# Patient Record
Sex: Female | Born: 1975 | Race: Black or African American | Hispanic: No | Marital: Single | Smoking: Never smoker
Health system: Southern US, Community
[De-identification: ages and names within clinical notes are randomized; demographics above are authoritative.]

## PROBLEM LIST (undated history)

## (undated) DIAGNOSIS — E7800 Pure hypercholesterolemia, unspecified: Secondary | ICD-10-CM

## (undated) DIAGNOSIS — J45909 Unspecified asthma, uncomplicated: Secondary | ICD-10-CM

## (undated) DIAGNOSIS — Z800 Family history of malignant neoplasm of digestive organs: Secondary | ICD-10-CM

## (undated) DIAGNOSIS — C801 Malignant (primary) neoplasm, unspecified: Secondary | ICD-10-CM

## (undated) DIAGNOSIS — Z803 Family history of malignant neoplasm of breast: Secondary | ICD-10-CM

## (undated) DIAGNOSIS — R112 Nausea with vomiting, unspecified: Secondary | ICD-10-CM

## (undated) DIAGNOSIS — Z9889 Other specified postprocedural states: Secondary | ICD-10-CM

## (undated) DIAGNOSIS — Z8042 Family history of malignant neoplasm of prostate: Secondary | ICD-10-CM

## (undated) HISTORY — PX: FINGER SURGERY: SHX640

## (undated) HISTORY — DX: Pure hypercholesterolemia, unspecified: E78.00

## (undated) HISTORY — DX: Family history of malignant neoplasm of prostate: Z80.42

## (undated) HISTORY — DX: Family history of malignant neoplasm of breast: Z80.3

## (undated) HISTORY — PX: WISDOM TOOTH EXTRACTION: SHX21

## (undated) HISTORY — DX: Family history of malignant neoplasm of digestive organs: Z80.0

---

## 2001-01-11 ENCOUNTER — Other Ambulatory Visit: Admission: RE | Admit: 2001-01-11 | Discharge: 2001-01-11 | Payer: Self-pay | Admitting: Obstetrics and Gynecology

## 2002-12-24 ENCOUNTER — Other Ambulatory Visit: Admission: RE | Admit: 2002-12-24 | Discharge: 2002-12-24 | Payer: Self-pay | Admitting: Obstetrics and Gynecology

## 2003-05-12 ENCOUNTER — Encounter: Admission: RE | Admit: 2003-05-12 | Discharge: 2003-05-12 | Payer: Self-pay | Admitting: Family Medicine

## 2003-05-12 IMAGING — CR DG WRIST COMPLETE 3+V*R*
2 series · 2 of 2 positions shown · non-contrast
Comparison: none

CLINICAL DATA: Right forearm and wrist pain.  Question tendinitis.  No known injury.
 RIGHT WRIST 4 VIEWS
 Small lucency within the proximal capitate may represent a small subchondral cyst.  Otherwise, no bony abnormality or soft tissue abnormality detected.  If symptoms persist or progress, MR imaging may be considered for further delineation.
 IMPRESSION
 Small lucency within the capitate may represent a small subchondral cyst.  Otherwise negative exam as noted above.
 RIGHT FOREARM 2 VIEWS
 No evidence of a bony destructive lesion or soft tissue abnormality.  If symptoms persist or progress, MR imaging is recommended for further delineation.
 Negative plain film exam.

[view not recorded (1 of 2)]
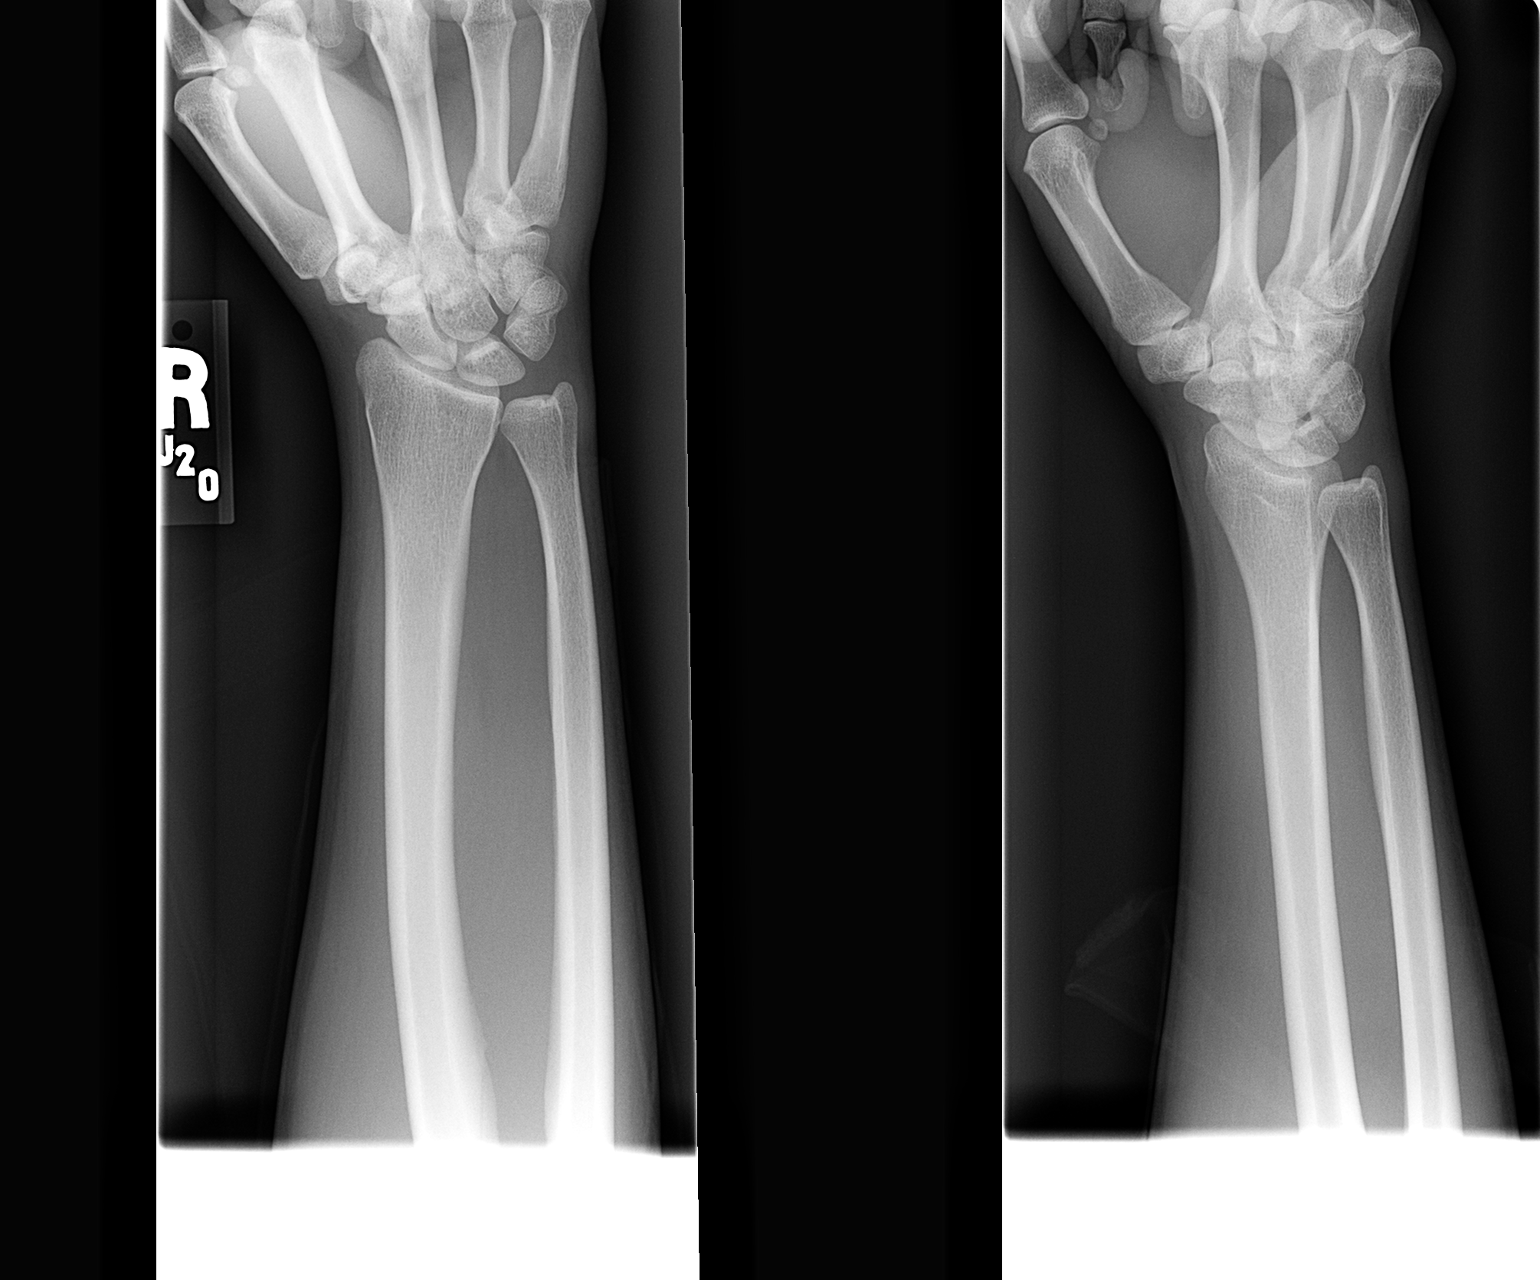

[view not recorded (2 of 2)]
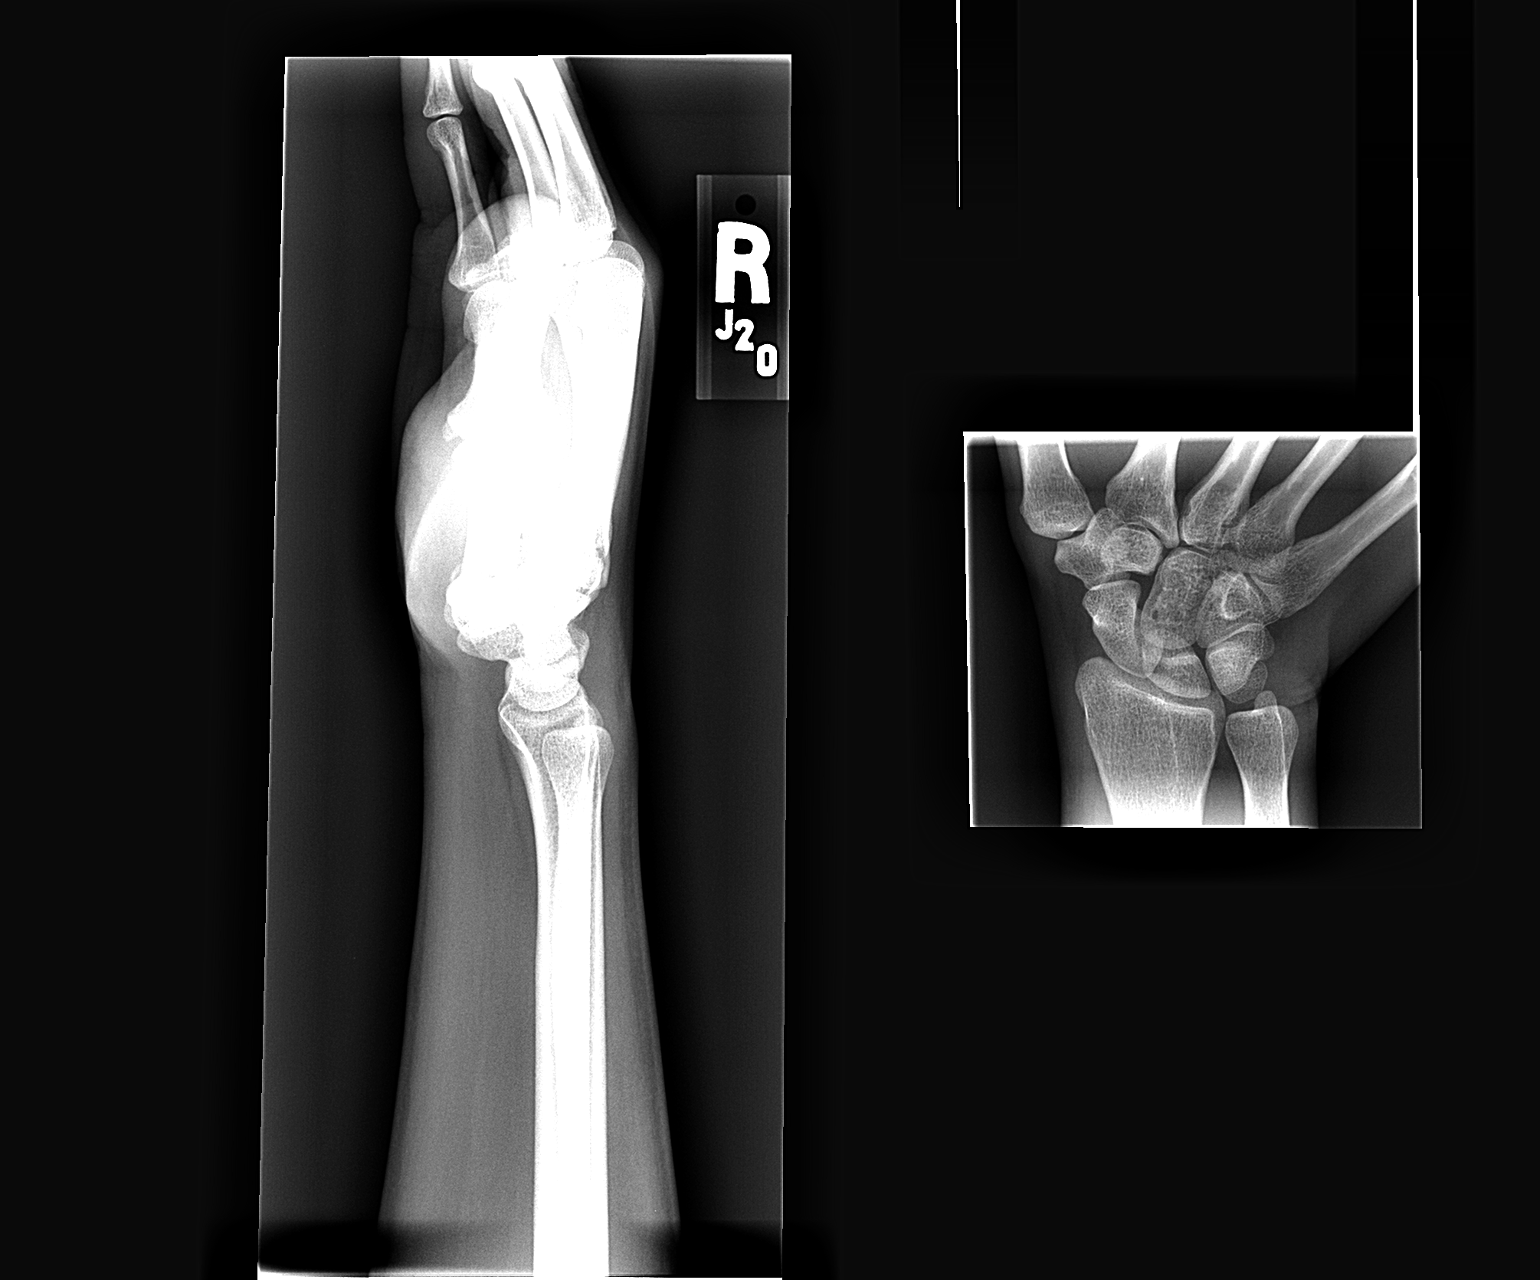

[2 of 2 positions shown; findings below may reference images not displayed]

## 2003-05-12 IMAGING — CR DG FOREARM 2V*R*
2 series · 2 of 2 positions shown · non-contrast
Comparison: none

CLINICAL DATA: Right forearm and wrist pain.  Question tendinitis.  No known injury.
 RIGHT WRIST 4 VIEWS
 Small lucency within the proximal capitate may represent a small subchondral cyst.  Otherwise, no bony abnormality or soft tissue abnormality detected.  If symptoms persist or progress, MR imaging may be considered for further delineation.
 IMPRESSION
 Small lucency within the capitate may represent a small subchondral cyst.  Otherwise negative exam as noted above.
 RIGHT FOREARM 2 VIEWS
 No evidence of a bony destructive lesion or soft tissue abnormality.  If symptoms persist or progress, MR imaging is recommended for further delineation.
 Negative plain film exam.

[view not recorded (1 of 2)]
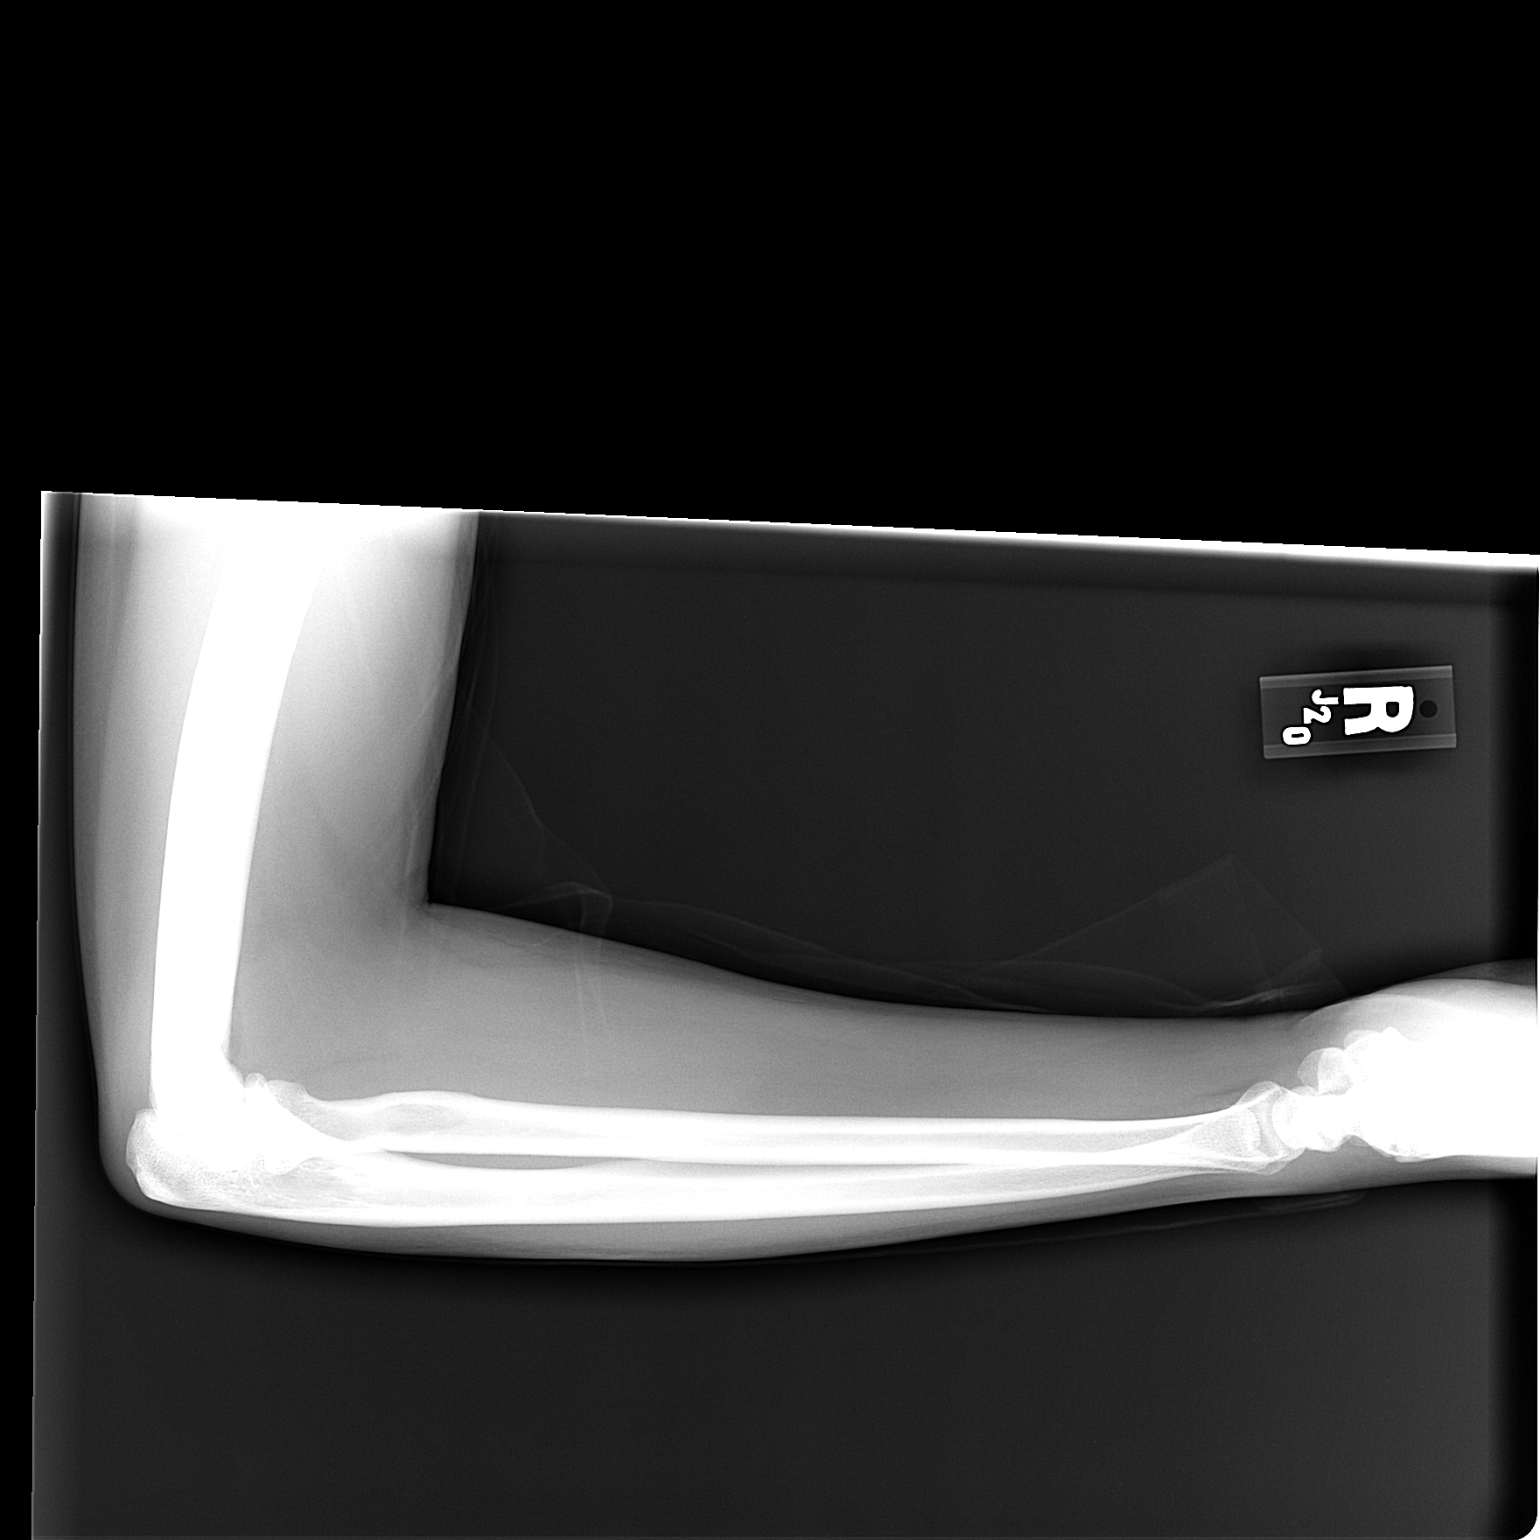

[view not recorded (2 of 2)]
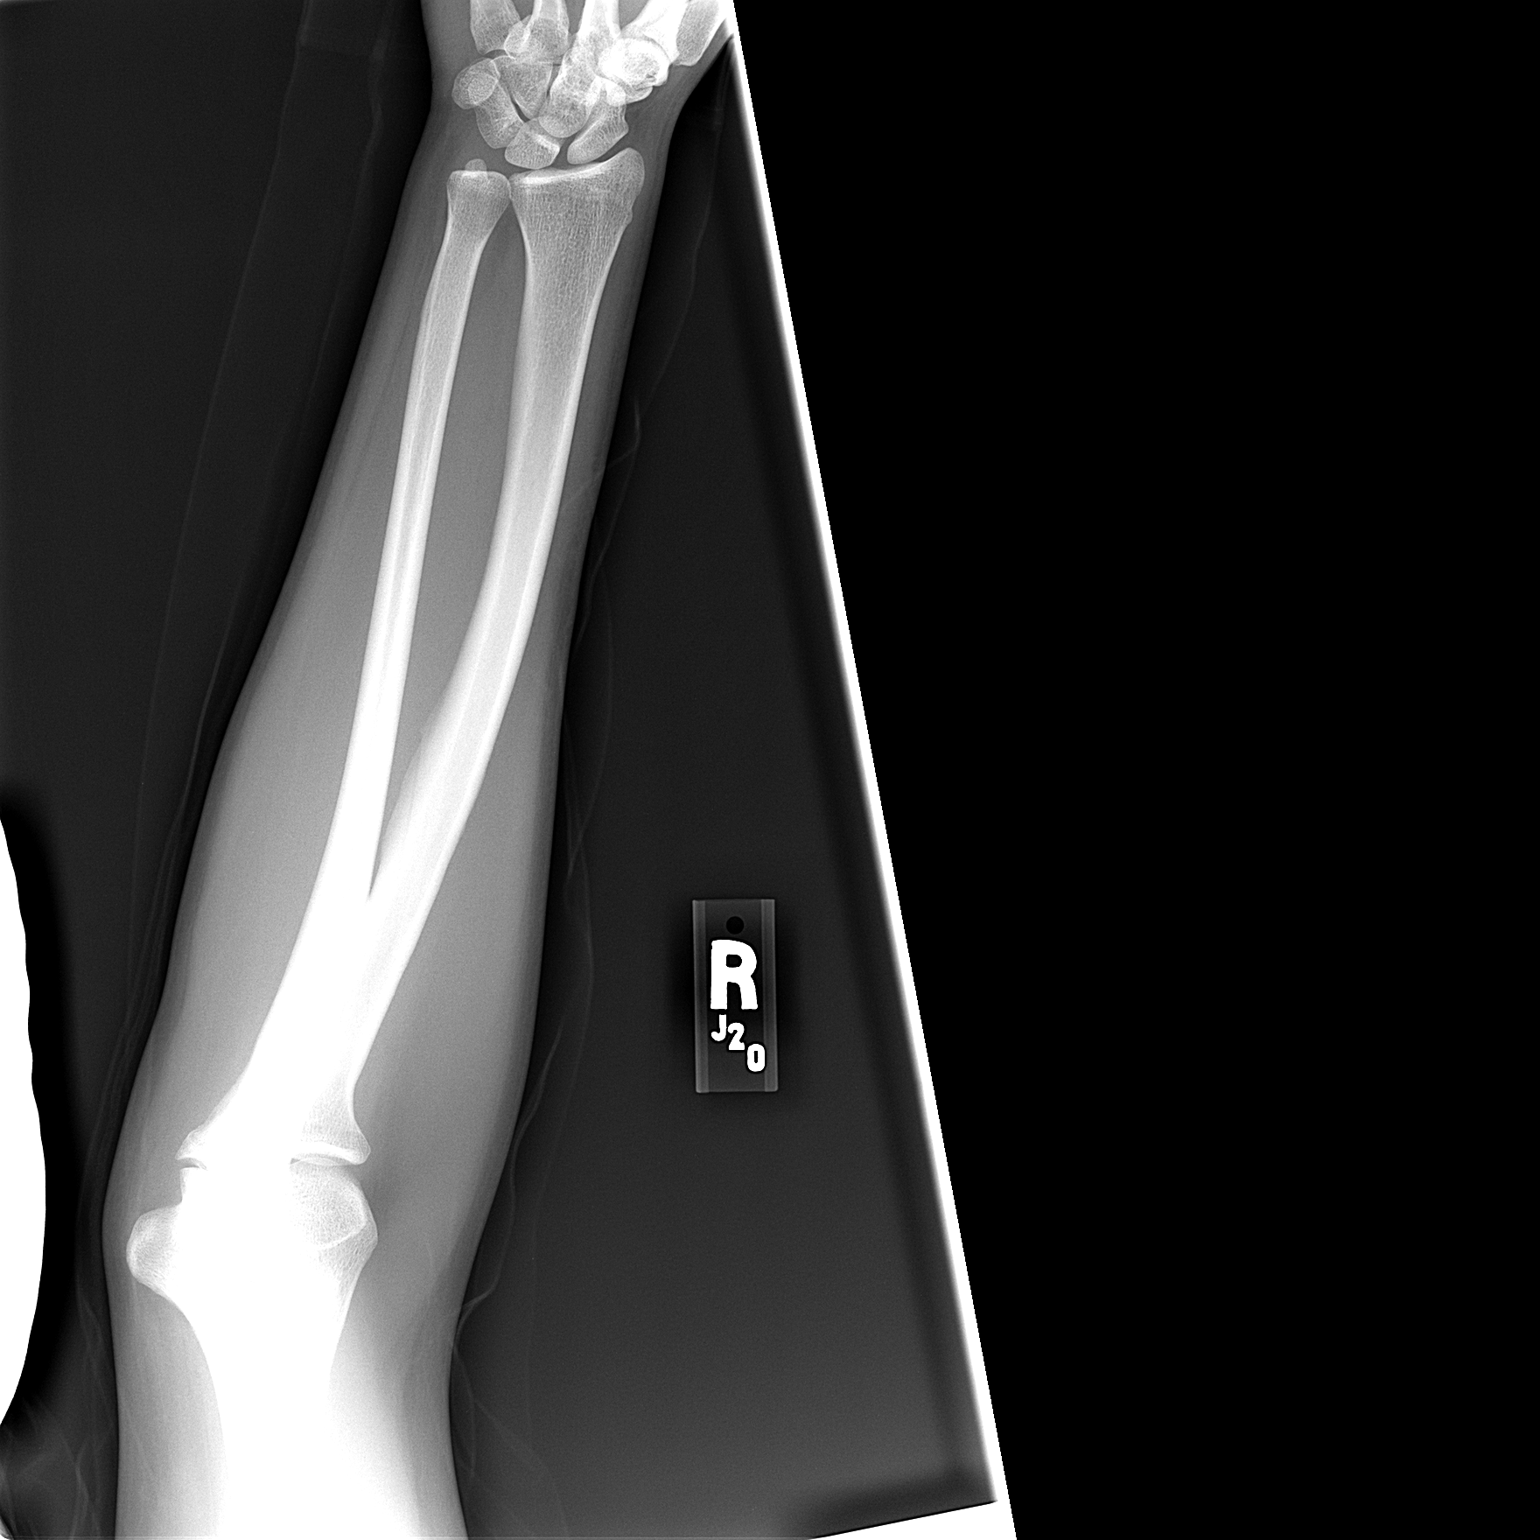

[2 of 2 positions shown; findings below may reference images not displayed]

## 2004-01-19 ENCOUNTER — Other Ambulatory Visit: Admission: RE | Admit: 2004-01-19 | Discharge: 2004-01-19 | Payer: Self-pay | Admitting: Family Medicine

## 2005-03-02 ENCOUNTER — Encounter: Admission: RE | Admit: 2005-03-02 | Discharge: 2005-03-02 | Payer: Self-pay | Admitting: Family Medicine

## 2005-03-02 IMAGING — US US TRANSVAGINAL NON-OB
1 series · 14 of 25 positions shown · non-contrast
Comparison: none

CLINICAL DATA: Pelvic pain and cramping

[Series 1: unknown · 0.18mm/px · 14 of 89 slices shown]
[im 1/89]
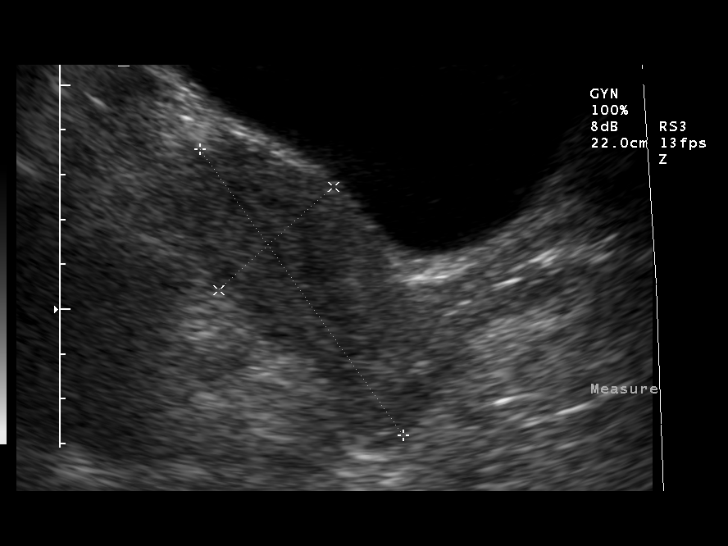
[im 8/89]
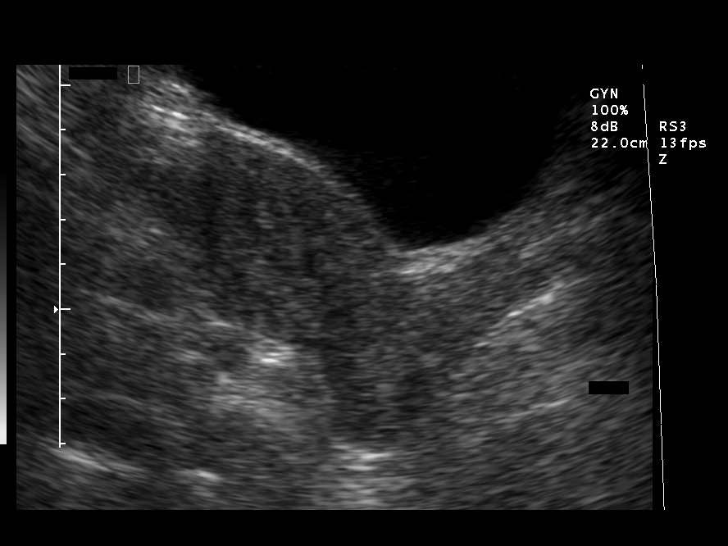
[im 15/89]
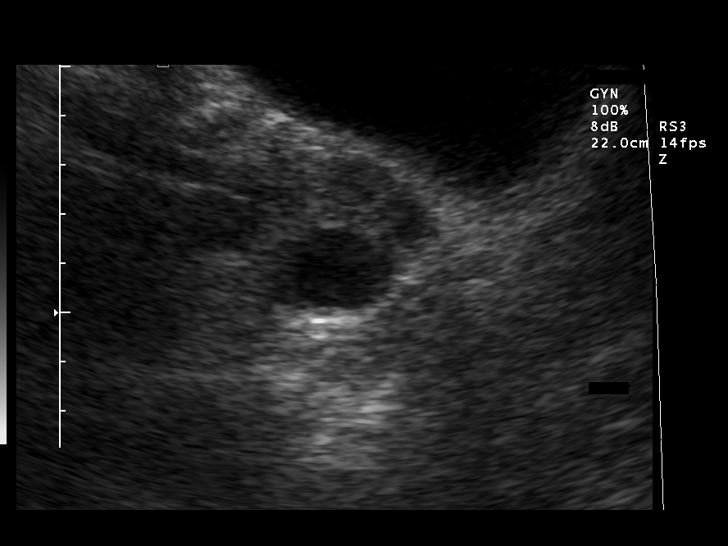
[im 23/89]
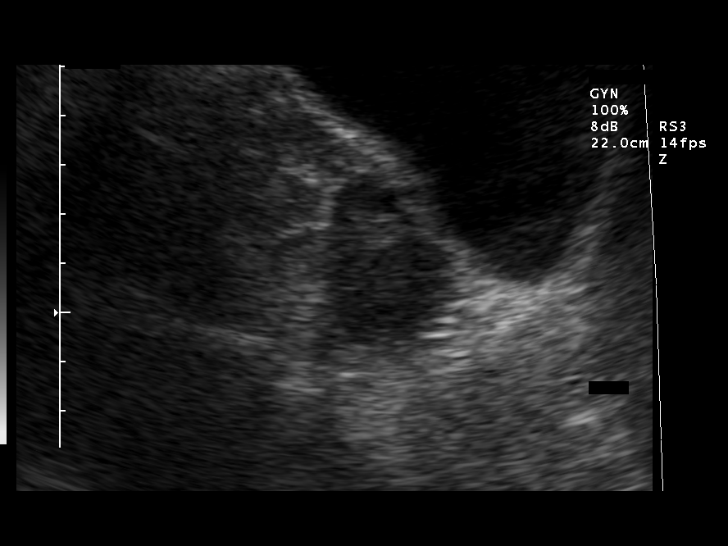
[im 30/89]
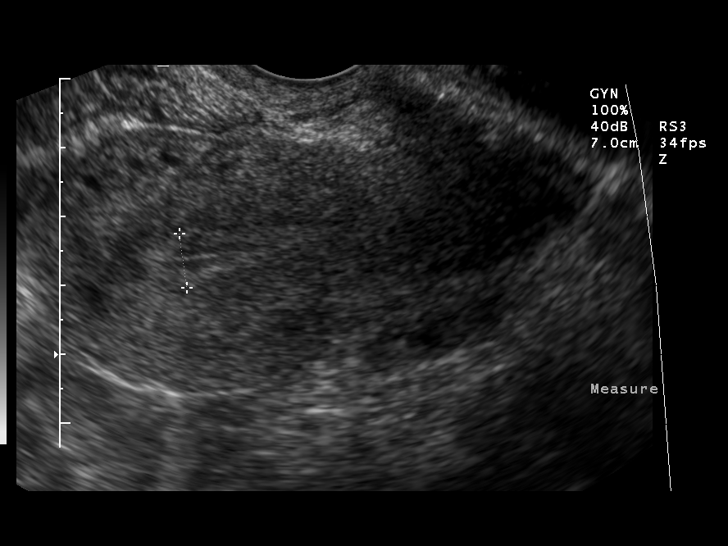
[im 34/89]
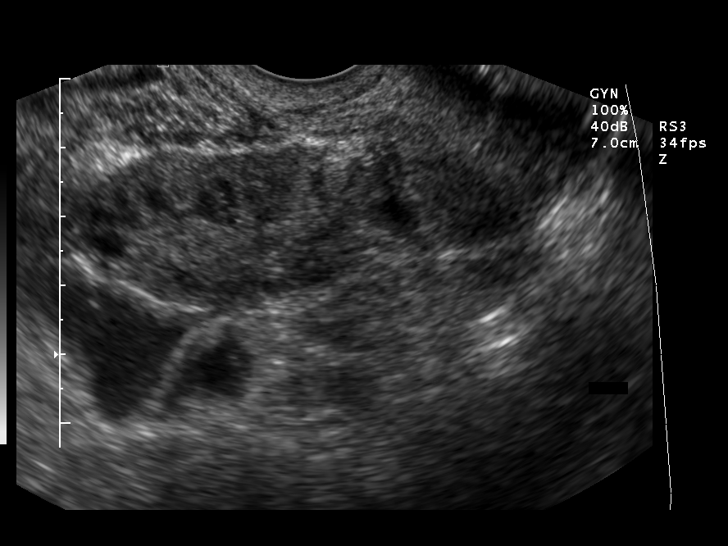
[im 41/89]
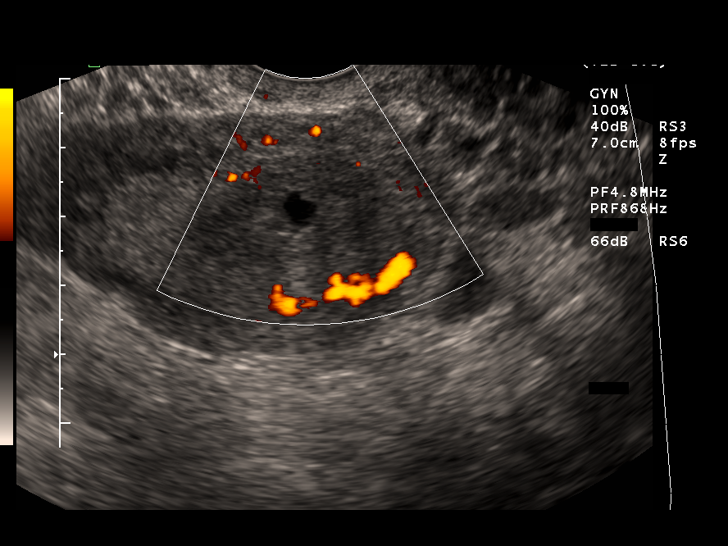
[im 48/89]
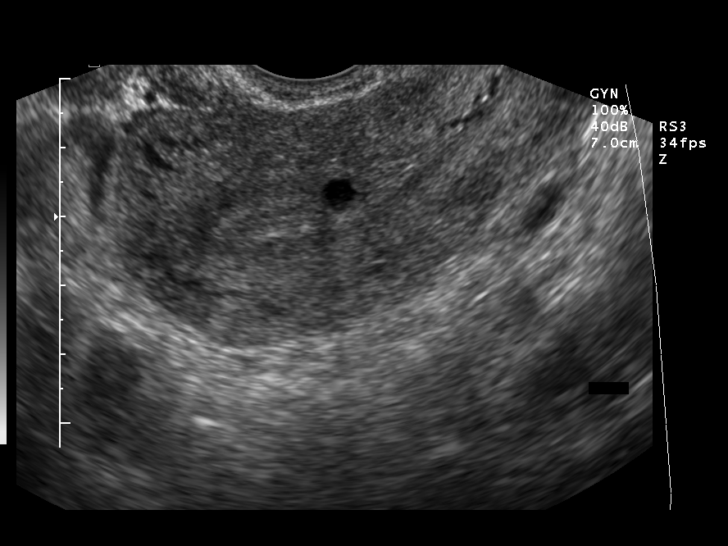
[im 56/89]
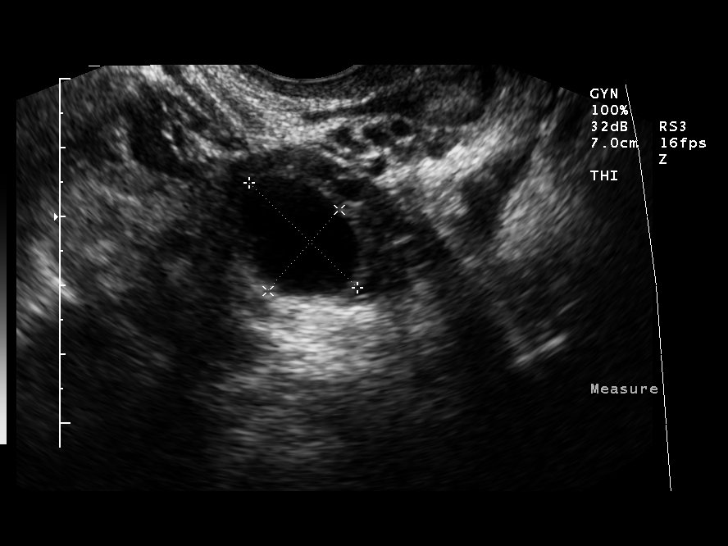
[im 59/89]
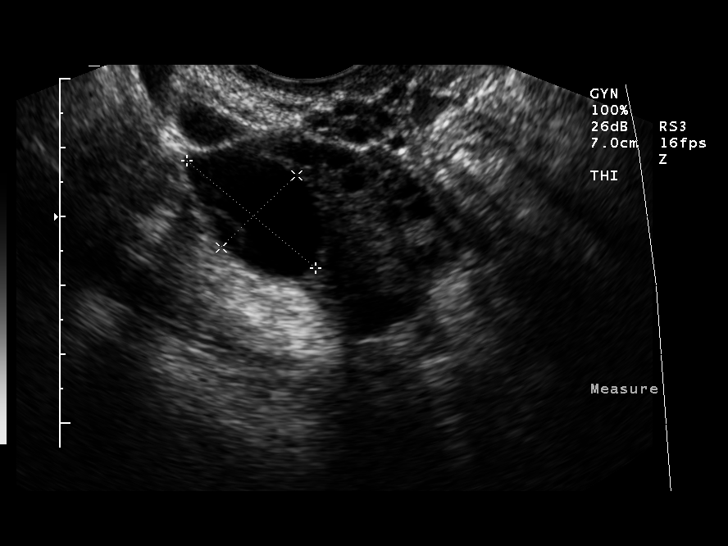
[im 67/89]
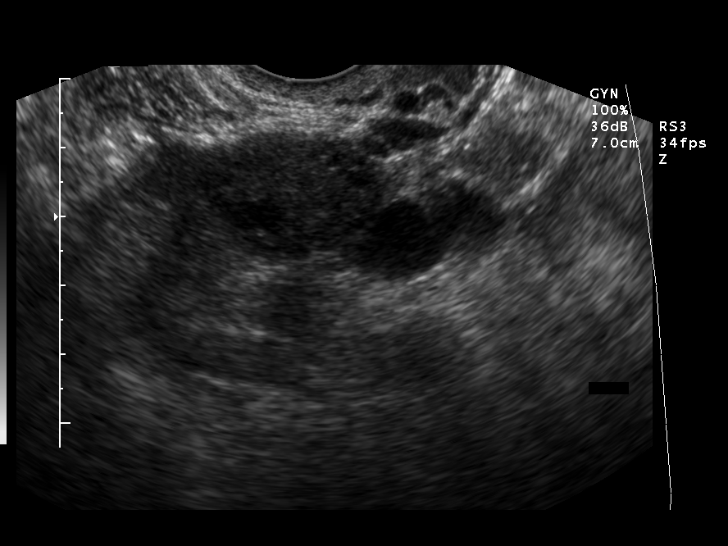
[im 74/89]
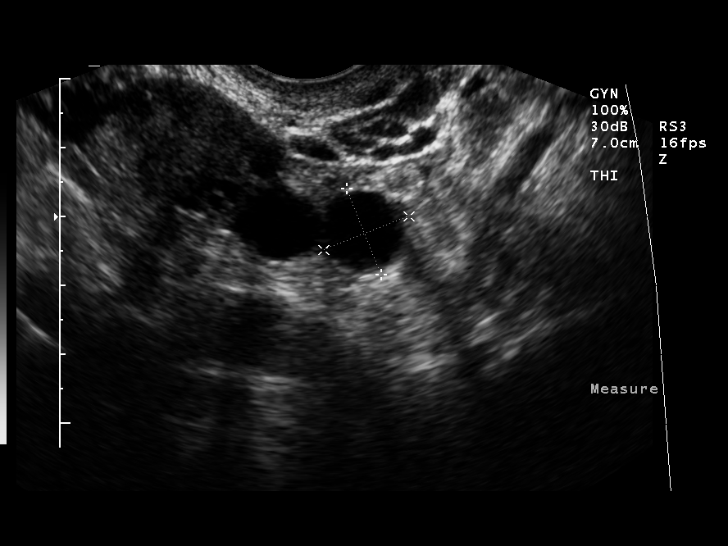
[im 81/89]
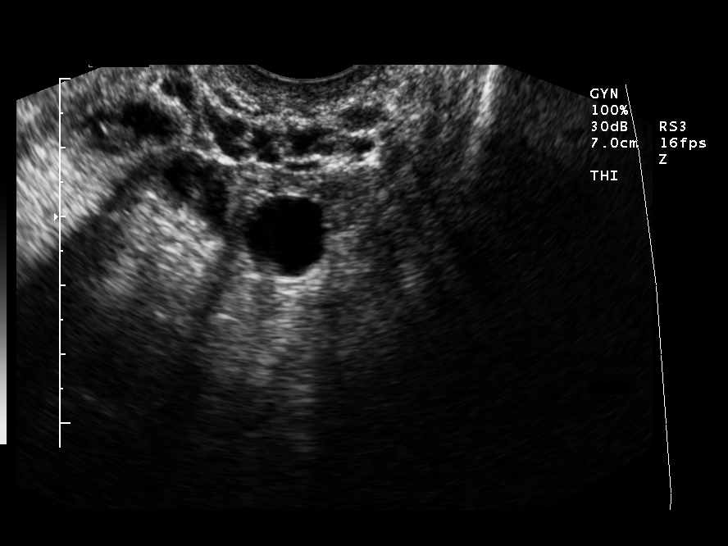
[im 89/89]
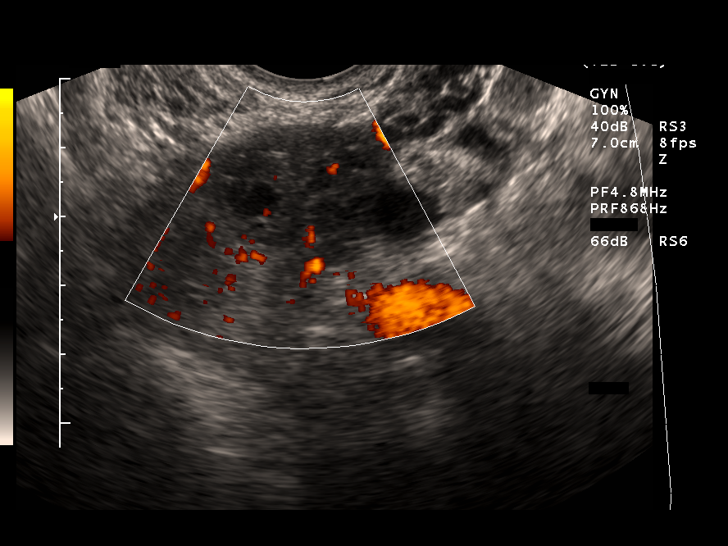

[14 of 25 positions shown; findings below may reference images not displayed]

Pelvic and transvaginal ultrasound:

No previous available for comparison. The uterus measures 3.3 x 5.4 x 8.2 cm.
Endometrial stripe measures up to 7.9 mm in thickness, with suggestion of a
small 7 mm cyst. Right ovary 23 x 26 x 43 mm, containing a 2.4 cm septated cyst
and smaller follicles. Left ovary 20 x 22 x 34 mm, containing several follicles.
Two paraovarian cysts in the left, each 13 mm in diameter. There is no free
fluid.
IMPRESSION: 1. 2.4 cm right ovarian cyst and 2 smaller left parovarian cysts. If symptoms
persist, followup ultrasound after 2 menstrual cycles maybe useful to further
characterize.
2. Unremarkable uterus except for 7 mm cyst in the endometrial cavity. Correlate
with beta-hCG.

## 2005-03-16 ENCOUNTER — Other Ambulatory Visit: Admission: RE | Admit: 2005-03-16 | Discharge: 2005-03-16 | Payer: Self-pay | Admitting: Family Medicine

## 2005-08-05 ENCOUNTER — Encounter: Admission: RE | Admit: 2005-08-05 | Discharge: 2005-08-05 | Payer: Self-pay | Admitting: Family Medicine

## 2005-08-05 IMAGING — CR DG LUMBAR SPINE COMPLETE 4+V
5 series · 5 of 5 positions shown · non-contrast
Comparison: none

CLINICAL DATA: Low back pain for 4 months. 
 LUMBAR SPINE ? 4 VIEW:

[view not recorded (1 of 5)]
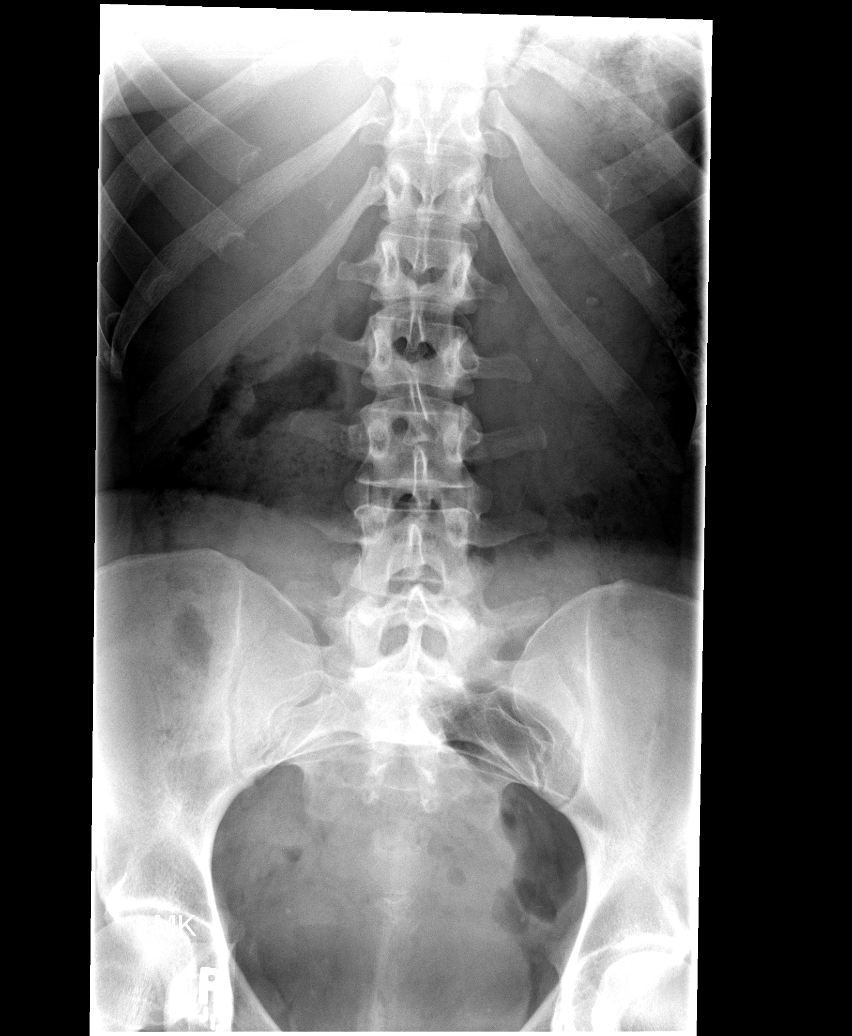

[view not recorded (2 of 5)]
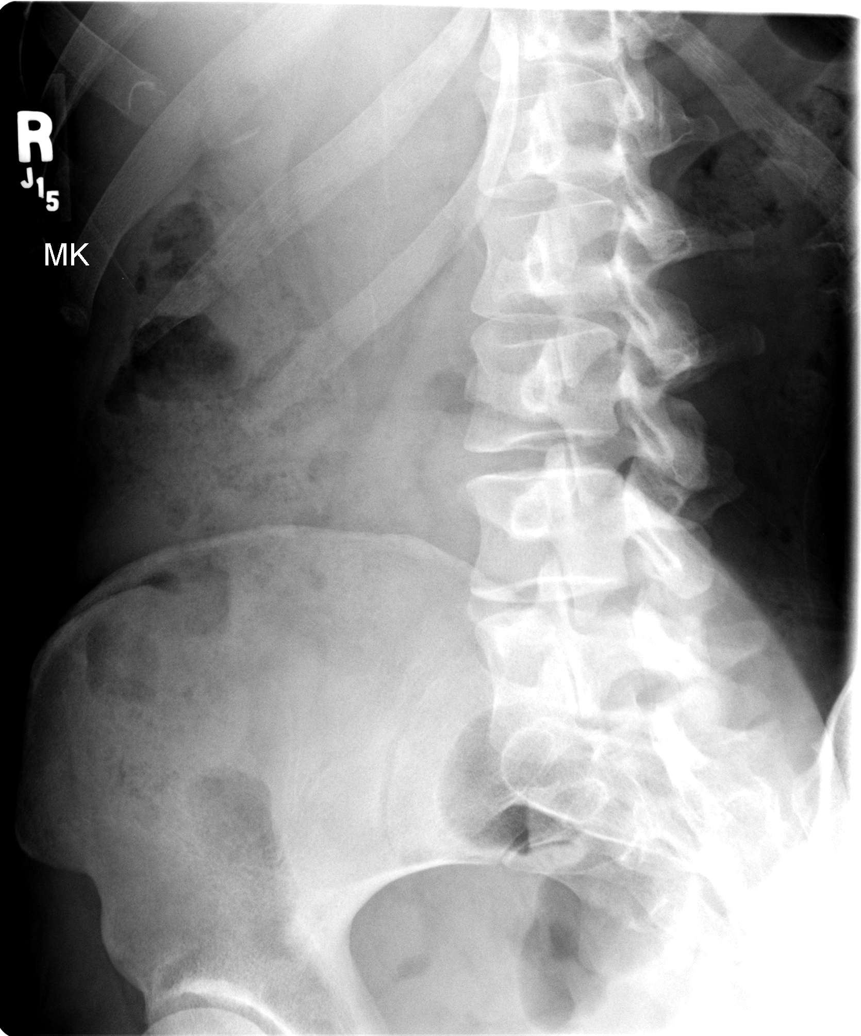

[view not recorded (3 of 5)]
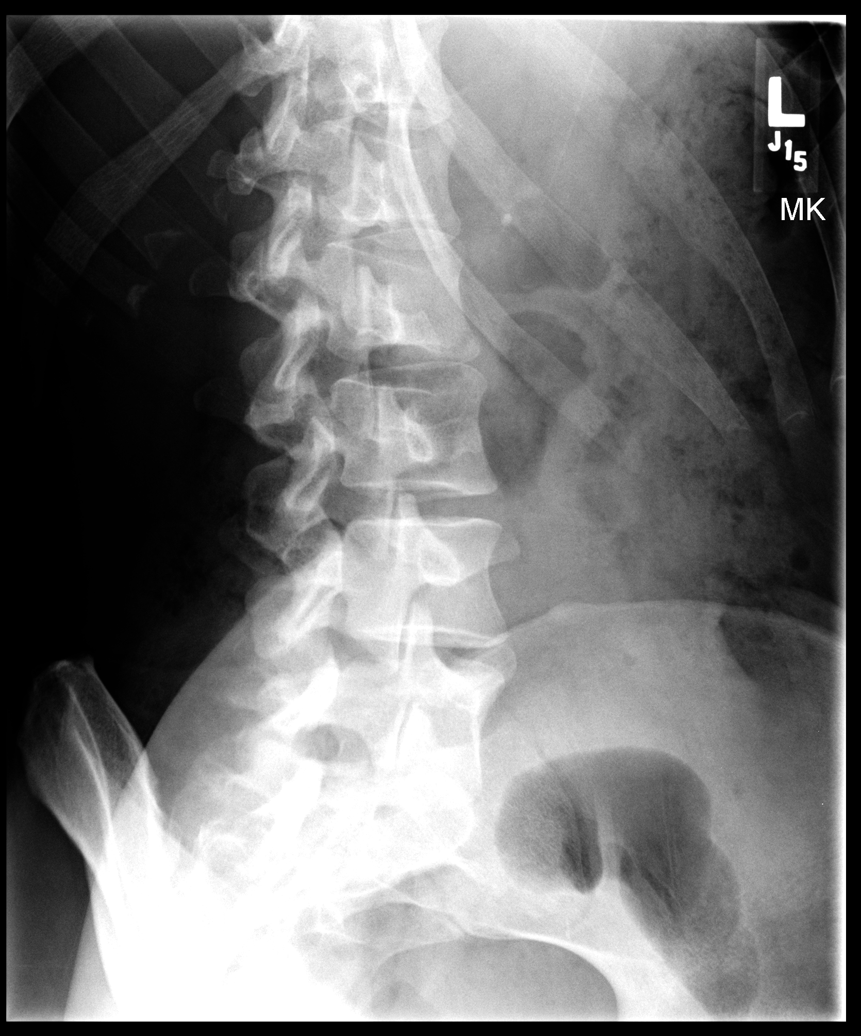

[view not recorded (4 of 5)]
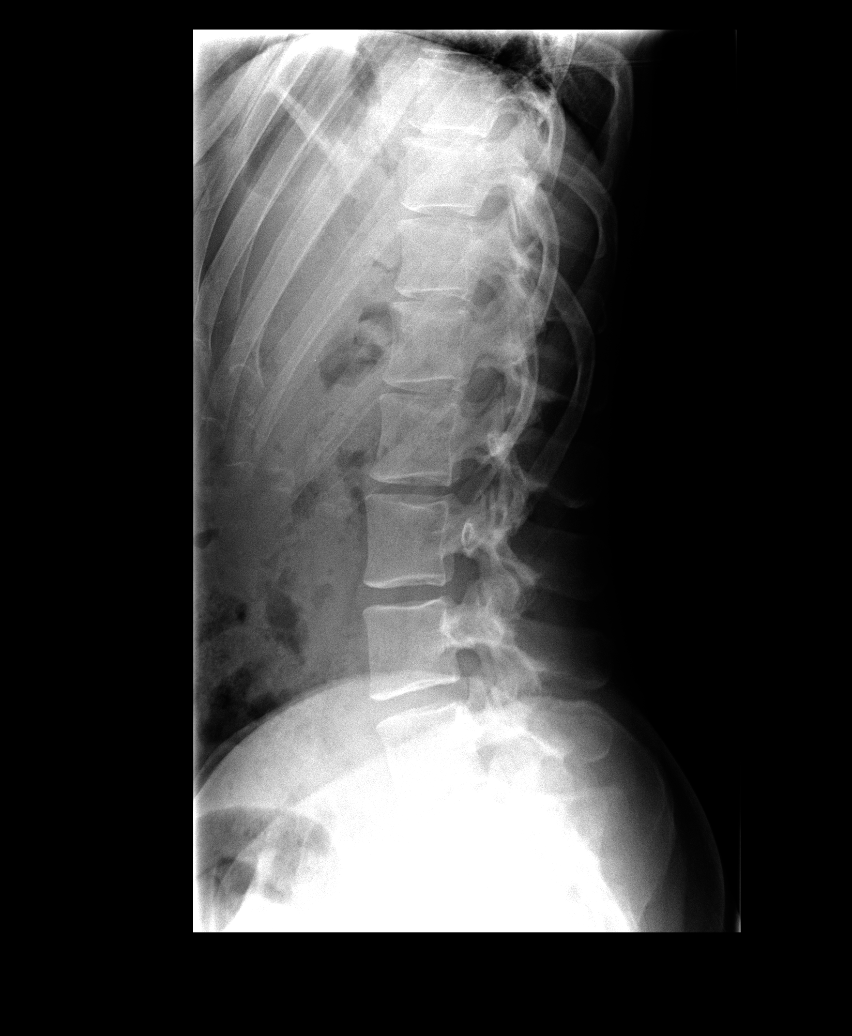

[view not recorded (5 of 5)]
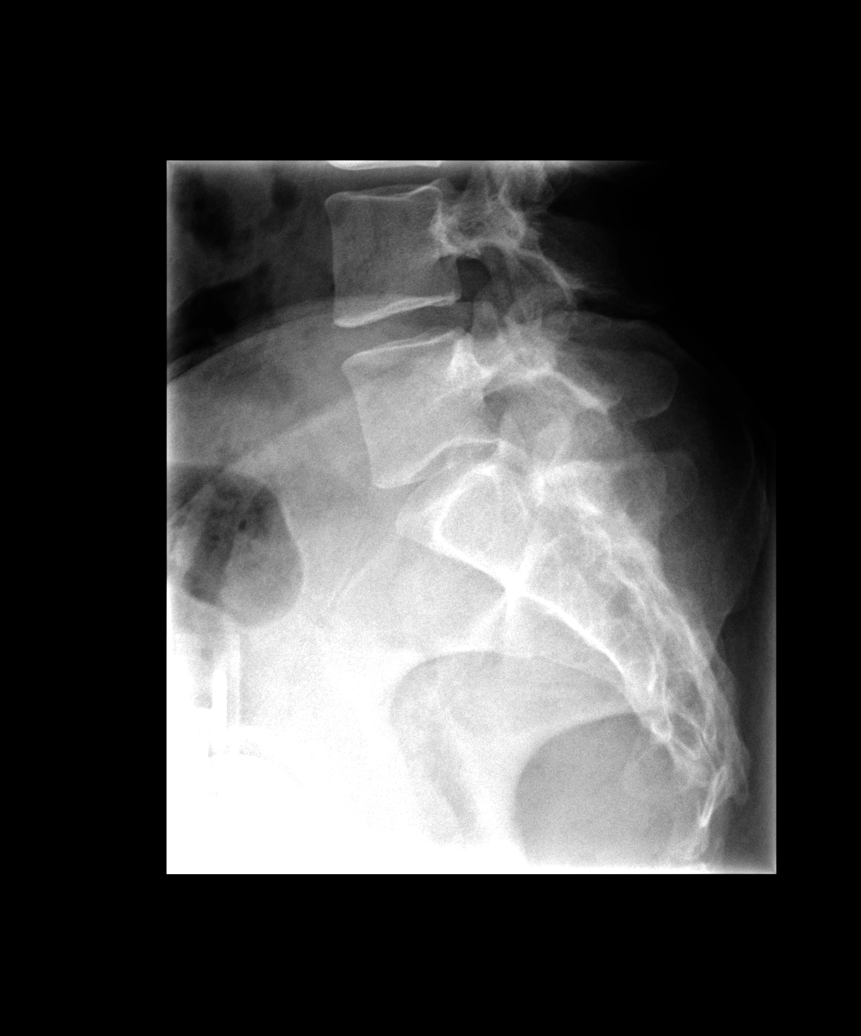

[5 of 5 positions shown; findings below may reference images not displayed]

FINDINGS: There are 5 lumbar-type vertebral bodies in normal alignment.  Disc space heights are normal.  No evidence of osseous or articular pathology.  There is a 4 mm renal stone in the lower pole collecting system on the left.
IMPRESSION: 1.  No spinal pathology evident.
 2.  Left renal calculus.

## 2005-11-04 ENCOUNTER — Ambulatory Visit: Payer: Self-pay | Admitting: Family Medicine

## 2005-12-06 ENCOUNTER — Ambulatory Visit: Payer: Self-pay | Admitting: Family Medicine

## 2005-12-23 ENCOUNTER — Ambulatory Visit: Payer: Self-pay | Admitting: Family Medicine

## 2006-03-07 ENCOUNTER — Ambulatory Visit: Payer: Self-pay | Admitting: Family Medicine

## 2006-03-31 ENCOUNTER — Ambulatory Visit: Payer: Self-pay | Admitting: Family Medicine

## 2006-03-31 LAB — CONVERTED CEMR LAB
Chol/HDL Ratio, serum: 4.1
Cholesterol: 241 mg/dL (ref 0–200)
Glucose, Bld: 87 mg/dL (ref 70–99)
HCT: 41 % (ref 36.0–46.0)
HDL: 58.5 mg/dL (ref >39.0)
Hemoglobin: 13.7 g/dL (ref 12.0–15.0)
LDL DIRECT: 166.3 mg/dL
MCHC: 33.4 g/dL (ref 30.0–36.0)
MCV: 89.7 fL (ref 78.0–100.0)
Platelets: 343 10*3/uL (ref 150–400)
RBC: 4.58 M/uL (ref 3.87–5.11)
RDW: 11.8 % (ref 11.5–14.6)
TSH: 0.77 microintl units/mL (ref 0.35–5.50)
Triglyceride fasting, serum: 59 mg/dL (ref 0–149)
VLDL: 12 mg/dL (ref 0–40)
WBC: 4.9 10*3/uL (ref 4.5–10.5)

## 2006-04-21 ENCOUNTER — Encounter (INDEPENDENT_AMBULATORY_CARE_PROVIDER_SITE_OTHER): Payer: Self-pay | Admitting: Family Medicine

## 2006-04-21 ENCOUNTER — Other Ambulatory Visit: Admission: RE | Admit: 2006-04-21 | Discharge: 2006-04-21 | Payer: Self-pay | Admitting: Family Medicine

## 2006-04-21 ENCOUNTER — Ambulatory Visit: Payer: Self-pay | Admitting: Family Medicine

## 2006-05-26 ENCOUNTER — Ambulatory Visit: Payer: Self-pay | Admitting: Internal Medicine

## 2006-05-26 LAB — CONVERTED CEMR LAB: hCG, Beta Chain, Quant, S: 46288.5 milliintl units/mL

## 2006-11-16 ENCOUNTER — Ambulatory Visit: Payer: Self-pay | Admitting: Internal Medicine

## 2007-01-12 ENCOUNTER — Inpatient Hospital Stay (HOSPITAL_COMMUNITY): Admission: AD | Admit: 2007-01-12 | Discharge: 2007-01-15 | Payer: Self-pay | Admitting: Obstetrics and Gynecology

## 2010-08-10 NOTE — Discharge Summary (Signed)
NAMEMANVI, Paige Perry                ACCOUNT NO.:  [id number]   MEDICAL RECORD NO.:  [id number]          PATIENT TYPE:  INP   LOCATION:  9116                          FACILITY:  [facility]   PHYSICIAN:  Lenoard Aden, M.D.DATE OF BIRTH:  Aug 26, 1975   DATE OF ADMISSION:  01/12/2007  DATE OF DISCHARGE:                               DISCHARGE SUMMARY   PRESENTING COMPLAINT:  Labor.   She is a 35 year old African American female G2, P1 at 39+ weeks who  presents in active labor.  She has no known drug allergies.  Medications  are prenatal vitamins.  She is a history of spontaneous vaginal delivery  x1 and TAB x2.  She is a nonsmoker, nondrinker.  She denies domestic or  physical violence.  She has a family history of chronic hypertension,  breast cancer, heart disease, sickle cell disease, and migraine  headaches.  She has a personal history of an abnormal Pap smear status  post laser surgery in 1995, history of chlamydia, history of asthma,  history of migraine headache.   PHYSICAL EXAMINATION:  She is well-developed, well-nourished African  American female in no acute distress.  HEENT:  Normal.  LUNGS:  Clear.  HEART:  Regular rate and rhythm.  ABDOMEN:  Soft, gravid, nontender.  Estimated fetal weight 7-and-a-half  pounds.  CERVIX:  Is 4, 100%, 0 station.  EXTREMITIES:  Show no cords.  NEUROLOGIC:  Nonfocal.  SKIN:  Intact.   IMPRESSION:  Term intrauterine pregnancy in early labor.   PLAN:  Anticipate attempts at vaginal delivery.      Lenoard Aden, M.D.  Electronically Signed     RJT/MEDQ  D:  01/13/2007  T:  01/15/2007  Job:  [id number]

## 2010-08-10 NOTE — Assessment & Plan Note (Signed)
[facility]                             PULMONARY OFFICE NOTE   NAME:FLOODDillyn, Joaquin                       MRN:          [id number]  DATE:11/16/2006                            DOB:          [birth year]    REFERRING PHYSICIAN:  Maxie Better, M.D.   REASON FOR CONSULTATION:  Dyspnea.   HISTORY:  A 35 year old black female who was pregnant 13 years ago at  about 30 pounds lighter and had no trouble at all.  However, on this  pregnancy for the last month she has noticed progressive dyspnea  associated with exertion associated with progressive abdominal  distention and mild reflux symptoms consisting of heartburn especially  after she eats.  She says both of these symptoms are different than what  she experienced on her last pregnancy 13 years ago.   The patient does have a history of asthma as a baby but says she outgrew  it in high school.  It came back after she was mowing grass 3 years  ago and was controlled with an inhaler but no longer is she mowing grass  and so she no longer is having any problem with this symptom.  She feels  this feels much different than what her asthma felt like.   She denies any associated cough, fevers, chills, sweats, chest pain,  significant leg swelling, orthopnea, PND, or any nocturnal exacerbation  of this.   PAST MEDICAL HISTORY:  Significant only for the fact that she is 8  months pregnant and had normal pregnancy 13 years ago as noted.   ALLERGIES:  None known.   MEDICATIONS:  Prenatal vitamins daily.   SOCIAL HISTORY:  She never smoked.  She works in [employer] and denies  any unusual travel, pet, or hobby exposure.   FAMILY HISTORY:  Significant for asthma in her father who also developed  emphysema and was a smoker.   REVIEW OF SYSTEMS:  Taken in detail on the worksheet negative except as  outlined above.   PHYSICAL EXAMINATION:  GENERAL:  This is a pleasant ambulatory black  female in no acute  distress.  VITAL SIGNS:  The patient is afebrile with normal vital signs with a  blood pressure of 100/66, pulse rate is 100 at rest.  HEENT:  Unremarkable.  Pharynx is clear.  Dentition is intact.  Nasal  turbinates normal.  Ear canals clear bilaterally.  NECK:  Supple without cervical adenopathy or tenderness.  Trachea is  midline.  No thyromegaly.  LUNGS:  Fields perfectly clear bilaterally on auscultation percussion.  HEART:  There is a regular rate and rhythm without murmur, gallop, or  rub.  No increase in P2.  ABDOMEN:  Soft, benign consistent with an 2-month intrauterine  pregnancy.  EXTREMITIES:  Warm without calf tenderness, cyanosis, clubbing, edema.   Hemoglobin saturation was 98% at rest and we walked her around the  office for a total of 3 laps at 185 feet each with a peak heart rate of  123 and a saturation of 98%.   IMPRESSION:  1. 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  The fact that she weighs 31      pounds more than she did at this point in her last pregnancy I      think explains exactly the dyspnea that she experiences that has      been progressive and proportionate to activity.  She has no      evidence of asthma, thromboembolic disease, or occult interstitial      lung disease at this point and I simply reassured her that this is      what one would expect with pregnancy.  2. However, she does have significant heartburn that I think may be      contributing to dyspnea and is definitely making her uncomfortable.      I recommended Prevacid plus diet and asked her to take one pill      before breakfast for a 2-week trial to see to what extent it      improved the problem and if so, she could either take Prilosec or      get a prescription for Prevacid through Dr. Cherly Hensen on her next      visit.   I did describe briefly how the diagnosis of asthma or pulmonary embolism  is different from  what she is experiencing and asked her to be on the  lookout for disproportionate dyspnea or any abrupt change in her pattern  of dyspnea and we would be happy to see her back here in the pulmonary  office.  Otherwise, pulmonary followup can be p.r.n.     Charlaine Dalton. Sherene Sires, MD, [facility]  Electronically Signed    MBW/MedQ  DD: 11/16/2006  DT: 11/17/2006  Job #: [id number]   cc:   Maxie Better, M.D.

## 2010-08-13 NOTE — Assessment & Plan Note (Signed)
[facility]                          [facility] OFFICE NOTE   NAME:FLOODDustyn, Perry                       MRN:          [id number]  DATE:11/04/2005                            DOB:          Dec 11, 1975    REASON FOR VISIT:  Lower back pain.   [name] is a 35 year old female who reports that several months ago  she was having lower back pain which was treated with muscle relaxant and  anti-inflammatory.  Symptoms improved over the last month.  She has noticed  a discomfort in her lower back again usually associated with sitting in a  chair.  She denies any radiation of the pain.  She states it is localized to  the spine itself.  She did have an x-ray performed at her last visit, which  found no acute process in her lower back although there was an incidental  finding of a renal kidney stone.  She denies any significant change in her  activity, although she has been bowling recently.   PAST MEDICAL HISTORY:  Asthma, allergies.   SURGICAL HISTORY:  Cervical biopsy for positive HPV.   MEDICATIONS:  NuvaRing.   ALLERGIES:  No known drug allergies.   FAMILY HISTORY:  Mother has a history of hypertension, hyperlipidemia, sleep  apnea and glaucoma.  Father has a history of emphysema.  Mom has breast  cancer.   SOCIAL HISTORY:  She denies any alcohol or tobacco use.  She is single with  no children.   REVIEW OF SYSTEMS:  As per HPI.   OBJECTIVE:  VITAL SIGNS:  Blood pressure 106/68, weight of 131, pulse of 88.  GENERAL:  We have a pleasant female in no acute distress, questions  appropriately.  MUSCULOSKELETAL/NEUROLOGIC:  Significant for localized tenderness to the  lumbar spine.  No paraspinal tenderness was noted.  Straight leg raise was  negative.  Deep tendon reflexes were 2+ and equal bilaterally.  Gait was  uninhibited.   IMPRESSION:  A 35 year old female complaining of low back pain, which  previously improved with  anti-inflammatory and muscle relaxant.   PLAN:  1. Will re-treat the patient with Flexeril 10 mg t.i.d., side effects were      reviewed.  2. Also provided a prescription for Naprosyn 500 mg b.i.d. for 7-10 days.      Side effects were reviewed.  3. Additionally, will refer the patient to orthopedics given that her pain      is localized to the      lumbar spine itself.  I did review the x-ray report and advised the      patient to pick up her x-ray prior to her appointment with orthopedics.      The patient expressed understanding.                                   Leanne Chang, MD   LA/MedQ  DD:  11/04/2005  DT:  11/05/2005  Job #:  [id number]

## 2010-12-08 ENCOUNTER — Encounter: Payer: Self-pay | Admitting: Oncology

## 2011-01-05 LAB — CBC
HCT: 31.7 — ABNORMAL LOW
HCT: 34.6 — ABNORMAL LOW
Hemoglobin: 10.8 — ABNORMAL LOW
Hemoglobin: 11.9 — ABNORMAL LOW
MCHC: 34
MCHC: 34.5
MCV: 90.9
MCV: 91.9
Platelets: 238
Platelets: 260
RBC: 3.45 — ABNORMAL LOW
RBC: 3.81 — ABNORMAL LOW
RDW: 13.2
RDW: 13.4
WBC: 12.3 — ABNORMAL HIGH
WBC: 18.1 — ABNORMAL HIGH

## 2011-01-05 LAB — RPR: RPR Ser Ql: NONREACTIVE

## 2012-01-09 ENCOUNTER — Encounter (HOSPITAL_COMMUNITY): Payer: Self-pay

## 2012-01-09 ENCOUNTER — Emergency Department (HOSPITAL_COMMUNITY)
Admission: EM | Admit: 2012-01-09 | Discharge: 2012-01-09 | Disposition: A | Discharge status: Home / Self Care | Payer: 59 | Attending: Emergency Medicine | Admitting: Emergency Medicine

## 2012-01-09 DIAGNOSIS — M549 Dorsalgia, unspecified: Secondary | ICD-10-CM | POA: Insufficient documentation

## 2012-01-09 DIAGNOSIS — R109 Unspecified abdominal pain: Secondary | ICD-10-CM | POA: Insufficient documentation

## 2012-01-09 DIAGNOSIS — A499 Bacterial infection, unspecified: Secondary | ICD-10-CM | POA: Insufficient documentation

## 2012-01-09 DIAGNOSIS — Z7982 Long term (current) use of aspirin: Secondary | ICD-10-CM | POA: Insufficient documentation

## 2012-01-09 DIAGNOSIS — B9689 Other specified bacterial agents as the cause of diseases classified elsewhere: Secondary | ICD-10-CM | POA: Insufficient documentation

## 2012-01-09 DIAGNOSIS — N390 Urinary tract infection, site not specified: Secondary | ICD-10-CM

## 2012-01-09 DIAGNOSIS — N760 Acute vaginitis: Secondary | ICD-10-CM

## 2012-01-09 DIAGNOSIS — R10819 Abdominal tenderness, unspecified site: Secondary | ICD-10-CM | POA: Insufficient documentation

## 2012-01-09 LAB — URINALYSIS, ROUTINE W REFLEX MICROSCOPIC
Bilirubin Urine: NEGATIVE
Glucose, UA: NEGATIVE mg/dL
Ketones, ur: 40 mg/dL — AB
Leukocytes, UA: NEGATIVE
Nitrite: NEGATIVE
Specific Gravity, Urine: 1.018 (ref 1.005–1.030)
pH: 7 (ref 5.0–8.0)

## 2012-01-09 LAB — CBC WITH DIFFERENTIAL/PLATELET
Basophils Absolute: 0 10*3/uL (ref 0.0–0.1)
Basophils Relative: 0 % (ref 0–1)
Eosinophils Absolute: 0 10*3/uL (ref 0.0–0.7)
Eosinophils Relative: 0 % (ref 0–5)
HCT: 43.2 % (ref 36.0–46.0)
Hemoglobin: 14.3 g/dL (ref 12.0–15.0)
Lymphocytes Relative: 10 % — ABNORMAL LOW (ref 12–46)
Lymphs Abs: 1 10*3/uL (ref 0.7–4.0)
MCHC: 33.1 g/dL (ref 30.0–36.0)
Monocytes Absolute: 0.6 10*3/uL (ref 0.1–1.0)
Neutrophils Relative %: 84 % — ABNORMAL HIGH (ref 43–77)
Platelets: 231 10*3/uL (ref 150–400)
RBC: 4.86 MIL/uL (ref 3.87–5.11)
WBC: 9.6 10*3/uL (ref 4.0–10.5)

## 2012-01-09 LAB — BASIC METABOLIC PANEL
BUN: 10 mg/dL (ref 6–23)
Chloride: 100 mEq/L (ref 96–112)
Creatinine, Ser: 0.9 mg/dL (ref 0.50–1.10)
GFR calc Af Amer: >90 mL/min (ref >90)
GFR calc non Af Amer: 81 mL/min — ABNORMAL LOW (ref >90)
Glucose, Bld: 75 mg/dL (ref 70–99)
Potassium: 3.8 mEq/L (ref 3.5–5.1)
Sodium: 138 mEq/L (ref 135–145)

## 2012-01-09 LAB — WET PREP, GENITAL
Trich, Wet Prep: NONE SEEN
Yeast Wet Prep HPF POC: NONE SEEN

## 2012-01-09 LAB — URINE MICROSCOPIC-ADD ON

## 2012-01-09 LAB — POCT PREGNANCY, URINE: Preg Test, Ur: NEGATIVE

## 2012-01-09 MED ORDER — IBUPROFEN 400 MG PO TABS: 400.0000 mg | ORAL_TABLET | Freq: Four times a day (QID) | ORAL | Status: DC | PRN

## 2012-01-09 MED ORDER — HYDROCODONE-ACETAMINOPHEN 5-325 MG PO TABS
1.0000 | ORAL_TABLET | Freq: Once | ORAL | Status: AC
  Administered 2012-01-09: 1 via ORAL
  Filled 2012-01-09: qty 1

## 2012-01-09 MED ORDER — METRONIDAZOLE 500 MG PO TABS: 500.0000 mg | ORAL_TABLET | Freq: Two times a day (BID) | ORAL | Status: DC

## 2012-01-09 MED ORDER — NITROFURANTOIN MONOHYD MACRO 100 MG PO CAPS: 100.0000 mg | ORAL_CAPSULE | Freq: Two times a day (BID) | ORAL | Status: DC

## 2012-01-09 NOTE — ED Notes (Signed)
Patient reports that she is having dysuria, lower abdominal and left flank pain x 3 days. Patient denies any vaginal discharge.

## 2012-01-09 NOTE — ED Provider Notes (Addendum)
History     CSN: [id number]  Arrival date & time 01/09/12  1443   First MD Initiated Contact with Patient 01/09/12 1622      Chief Complaint  Patient presents with  . Back Pain  . Abdominal Pain    (Consider location/radiation/quality/duration/timing/severity/associated sxs/prior treatment) HPI Comments: Pt comes in with cc of abd pain and some back pain. Pt reports that her sx started just before the weekend, and that the pain is located in the suprapubic region on the right side and is constant pain - but resolves with motrin. The pain feels like "bladder pain" and "bladder filling" but sheh as no hx of UTI, and has no dysuria, hematuria, with some polyuria. She has no n/v/f/c. No hx of STD, no vaginal discharge. No hx of renal stones.  Patient is a 36 y.o. female presenting with back pain and abdominal pain. The history is provided by the patient.  Back Pain  Associated symptoms include abdominal pain. Pertinent negatives include no chest pain, no headaches and no dysuria.  Abdominal Pain The primary symptoms of the illness include abdominal pain. The primary symptoms of the illness do not include shortness of breath, nausea, vomiting or dysuria.  Additional symptoms associated with the illness include back pain. Symptoms associated with the illness do not include hematuria.    History reviewed. No pertinent past medical history.  Past Surgical History  Procedure Date  . Finger surgery     Family History  Problem Relation Age of Onset  . Hypertension Mother   . Heart failure Mother     History  Substance Use Topics  . Smoking status: Never Smoker   . Smokeless tobacco: Never Used  . Alcohol Use: Yes     occasionally    OB History    Grav Para Term Preterm Abortions TAB SAB Ect Mult Living                  Review of Systems  Constitutional: Negative for activity change.  HENT: Negative for neck pain.   Respiratory: Negative for shortness of breath.     Cardiovascular: Negative for chest pain.  Gastrointestinal: Positive for abdominal pain. Negative for nausea and vomiting.  Genitourinary: Negative for dysuria and hematuria.  Musculoskeletal: Positive for back pain.  Neurological: Negative for headaches.    Allergies  Review of patient's allergies indicates no known allergies.  Home Medications   Current Outpatient Rx  Name Route Sig Dispense Refill  . ASPIRIN 325 MG PO TABS Oral Take 325-650 mg by mouth every 6 (six) hours as needed. Take 1-2 tablets as needed pain      BP 111/68  Pulse 58  Temp 98.3 F (36.8 C) (Oral)  Resp 18  SpO2 100%  LMP 12/30/2011  Physical Exam  Nursing note and vitals reviewed. Constitutional: She is oriented to person, place, and time. She appears well-developed and well-nourished.  HENT:  Head: Normocephalic and atraumatic.  Eyes: Conjunctivae normal and EOM are normal. Pupils are equal, round, and reactive to light.  Neck: Normal range of motion. Neck supple.  Cardiovascular: Normal rate, regular rhythm, normal heart sounds and intact distal pulses.   No murmur heard. Pulmonary/Chest: Effort normal. No respiratory distress. She has no wheezes.  Abdominal: Soft. Bowel sounds are normal. She exhibits no distension. There is tenderness. There is no rebound and no guarding.       Mild tenderness with suprapubic palpation  Genitourinary: Vagina normal and uterus normal.  External exam - normal, no lesions Speculum exam: Pt has some white discharge, no blood Bimanual exam: Patient has no CMT, no adnexal tenderness or fullness and cervical os is closed  Neurological: She is alert and oriented to person, place, and time.  Skin: Skin is warm and dry.    ED Course  Procedures (including critical care time)  Labs Reviewed  URINALYSIS, ROUTINE W REFLEX MICROSCOPIC - Abnormal; Notable for the following:    Hgb urine dipstick LARGE (*)     Ketones, ur 40 (*)     Protein, ur 30 (*)     All  other components within normal limits  CBC WITH DIFFERENTIAL - Abnormal; Notable for the following:    Neutrophils Relative 84 (*)     Lymphocytes Relative 10 (*)     Neutro Abs 8.0 (*)     All other components within normal limits  BASIC METABOLIC PANEL - Abnormal; Notable for the following:    GFR calc non Af Amer 81 (*)     All other components within normal limits  WET PREP, GENITAL - Abnormal; Notable for the following:    Clue Cells Wet Prep HPF POC FEW (*)     WBC, Wet Prep HPF POC FEW (*)     All other components within normal limits  URINE MICROSCOPIC-ADD ON - Abnormal; Notable for the following:    Bacteria, UA FEW (*)     All other components within normal limits  POCT PREGNANCY, URINE  GC/CHLAMYDIA PROBE AMP, GENITAL   No results found.   No diagnosis found.    MDM  DDx includes: Diverticulitis Hernia Nephrolithiasis Pyelonephritis UTI/Cystitis Ovarian cyst TOA Ectopic pregnancy PID STD  Pt comes in with cc of suprapubic pain, and some feeling that her bladder is "filling up" with some polyuria. Pelvic exam was WNL. Non peritoneal exam. Concerns are essentially for a UTI. No indication for any imaging.  7:30 PM [place] is equiviocal - has bacteria and wbcs, but no leukocytes, or nitrites. Her hx is also not clear, classic UTI - but she has some bladder cramps, so we will treat as if she has UTI.               Derwood Kaplan, MD 01/09/12 1931  Derwood Kaplan, MD 01/09/12 1478

## 2012-01-10 LAB — GC/CHLAMYDIA PROBE AMP, GENITAL
Chlamydia, DNA Probe: NEGATIVE
GC Probe Amp, Genital: NEGATIVE

## 2014-12-03 DIAGNOSIS — J3089 Other allergic rhinitis: Secondary | ICD-10-CM | POA: Insufficient documentation

## 2015-01-01 ENCOUNTER — Ambulatory Visit (INDEPENDENT_AMBULATORY_CARE_PROVIDER_SITE_OTHER): Payer: 59 | Admitting: Allergy and Immunology

## 2015-01-01 ENCOUNTER — Encounter: Payer: Self-pay | Admitting: Allergy and Immunology

## 2015-01-01 VITALS — BP 98/60 | HR 72 | Resp 16

## 2015-01-01 DIAGNOSIS — R21 Rash and other nonspecific skin eruption: Secondary | ICD-10-CM

## 2015-01-01 DIAGNOSIS — J309 Allergic rhinitis, unspecified: Secondary | ICD-10-CM

## 2015-01-01 DIAGNOSIS — H1010 Acute atopic conjunctivitis, unspecified eye: Secondary | ICD-10-CM

## 2015-01-05 NOTE — Progress Notes (Signed)
FOLLOW UP NOTE  RE: LORAINA STAUFFER MRN: [id number] DOB: 1975/07/26 [facility] [place] [id number] Date of Office Visit: 01/01/2015  Subjective:  Paige Perry is a 39 y.o. female who presents today for follow-up of allergic rhinoconjunctivitis and rash.  HPI: Rayleigh reports she is doing much better.  The dermatology appointment was beneficial as she received several creams including to manage acne and Clobestol as needed.  She is pleased, though no official diagnosis for nodules--likely calcium deposits and she prefers to hold off on any biopsy.  She has no other new concerns and lab results had been previously reviewed over the phone in   September.  She feels Zyrtec is beneficial and denies any congestion, drainage or sneezing.  No Urgent care or ED visits, antibiotics or Prednisone.  Drug Allergies: Allergies  Allergen Reactions  . Other     Corn    Objective:   Filed Vitals:   01/01/15 1049  BP: 98/60  Pulse: 72  Resp: 16   Physical Exam  Constitutional: She is well-developed, well-nourished, and in no distress.  HENT:  Head: Atraumatic.  Right Ear: Tympanic membrane and ear canal normal.  Left Ear: Tympanic membrane and ear canal normal.  Nose: No mucosal edema or rhinorrhea. No epistaxis.  Mouth/Throat: Uvula is midline, oropharynx is clear and moist and mucous membranes are normal.  Neck: Neck supple.  Cardiovascular: Normal rate, S1 normal and S2 normal.   Pulmonary/Chest: Effort normal. She has no wheezes. She has no rhonchi. She has no rales.  Lymphadenopathy:    She has no cervical adenopathy.  Skin: Skin is warm and dry. Rash noted. Rash is nodular.  Decreased hypo/hyperpigmented areas previously at forearms, with nodular areas as previously.       LABS:  11/25/2014 Negative ANA, normal CH 50, ESR and tryptase, total IgE  153 IU/ML    Assessment:   1. Allergic  rhinoconjunctivitis   2. Dermatitis, improved with topical regime per Dermatology.   3.      Acne and subcutaneous nodules, as managed by Dermatology.        Plan:  1.     Zarin will continue topical regime per Dermatology and follow-up with them as discussed. 2.     Given benefit continue Zyrtec. 3.     Establish with primary MD as discussed. 4.     Follow-up in mid 2017 or sooner if needed.    Whittney Steenson M. Ishmael Holter, MD

## 2015-12-29 ENCOUNTER — Other Ambulatory Visit: Payer: Self-pay | Admitting: *Deleted

## 2015-12-29 DIAGNOSIS — Z1231 Encounter for screening mammogram for malignant neoplasm of breast: Secondary | ICD-10-CM

## 2016-01-19 ENCOUNTER — Ambulatory Visit
Admission: RE | Admit: 2016-01-19 | Discharge: 2016-01-19 | Disposition: A | Discharge status: Home / Self Care | Payer: BLUE CROSS/BLUE SHIELD | Source: Ambulatory Visit | Attending: *Deleted | Admitting: *Deleted

## 2016-01-19 DIAGNOSIS — Z1231 Encounter for screening mammogram for malignant neoplasm of breast: Secondary | ICD-10-CM

## 2016-01-19 IMAGING — MG 2D DIGITAL SCREENING BILATERAL MAMMOGRAM WITH CAD AND ADJUNCT TO
8 of 13 series · 8 of 29 positions shown · non-contrast
Comparison: Previous exam(s).

CLINICAL DATA: Screening.

EXAM:
2D DIGITAL SCREENING BILATERAL MAMMOGRAM WITH CAD AND ADJUNCT TOMO

[L MLO (1 of 2)]
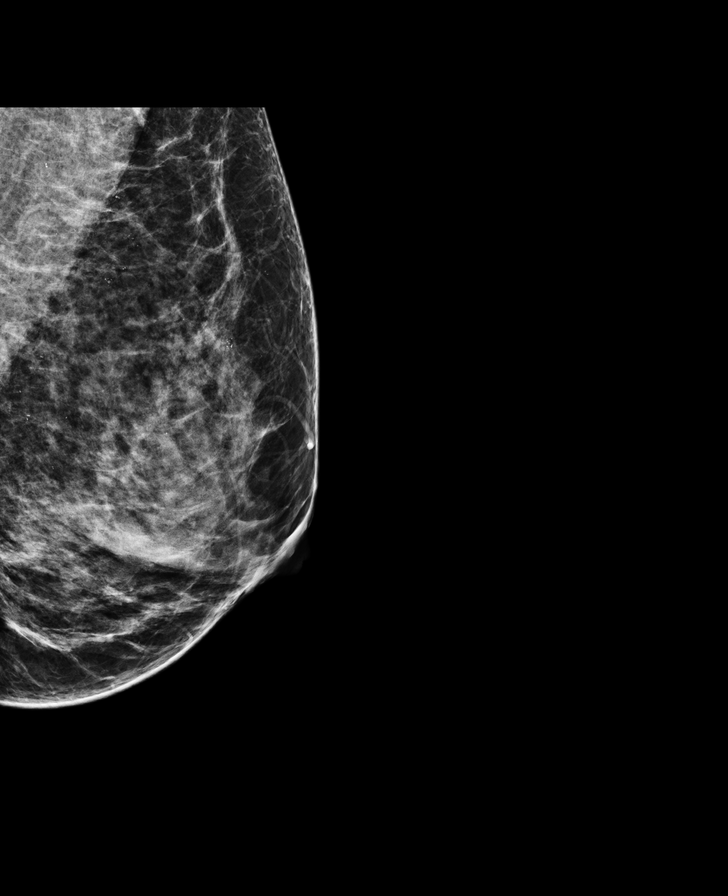

[R CC]
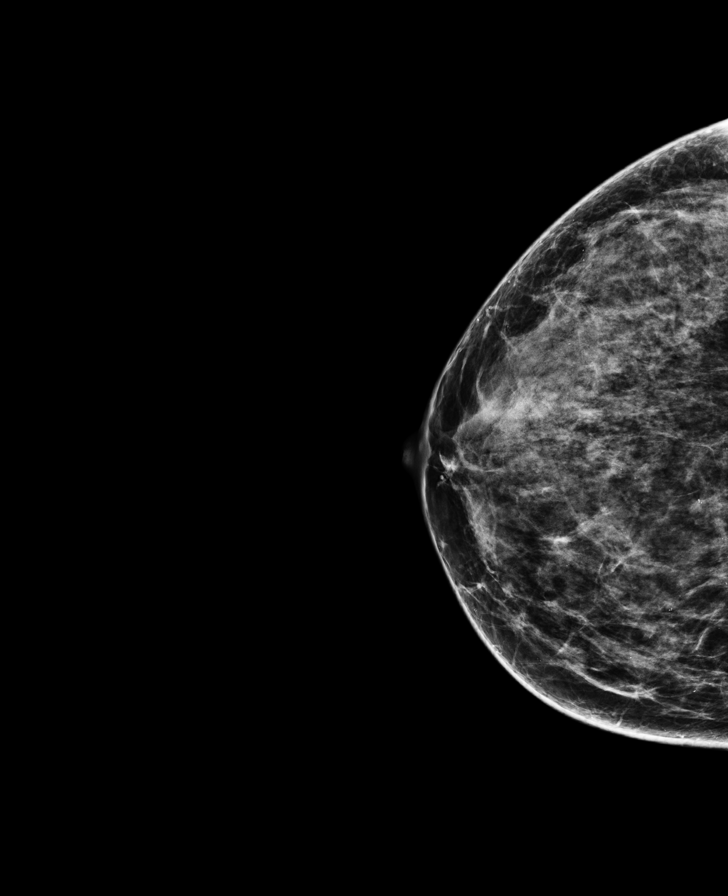

[R CC synth-2D]
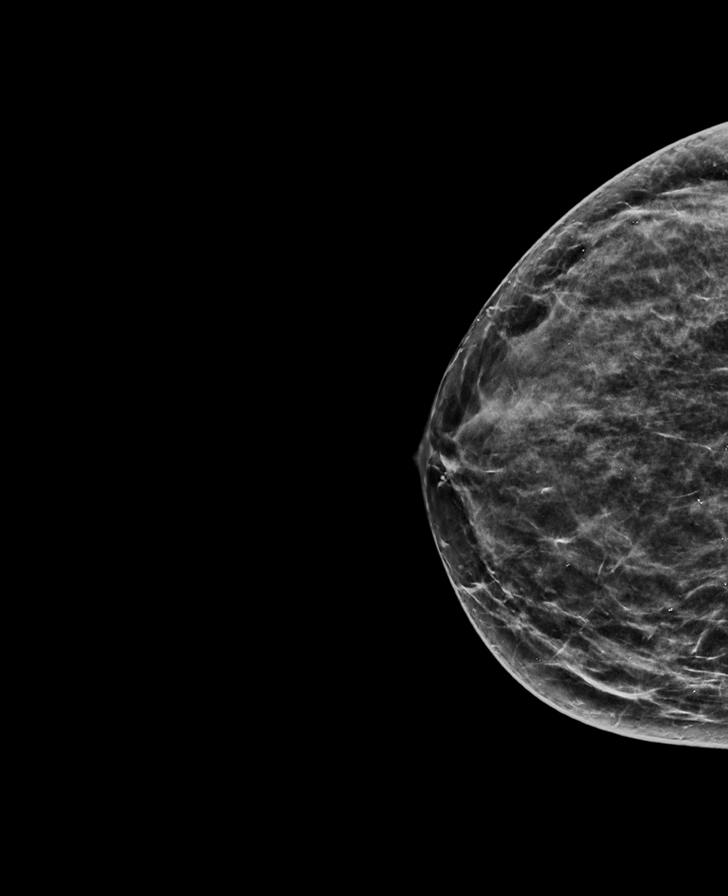

[L CC synth-2D]
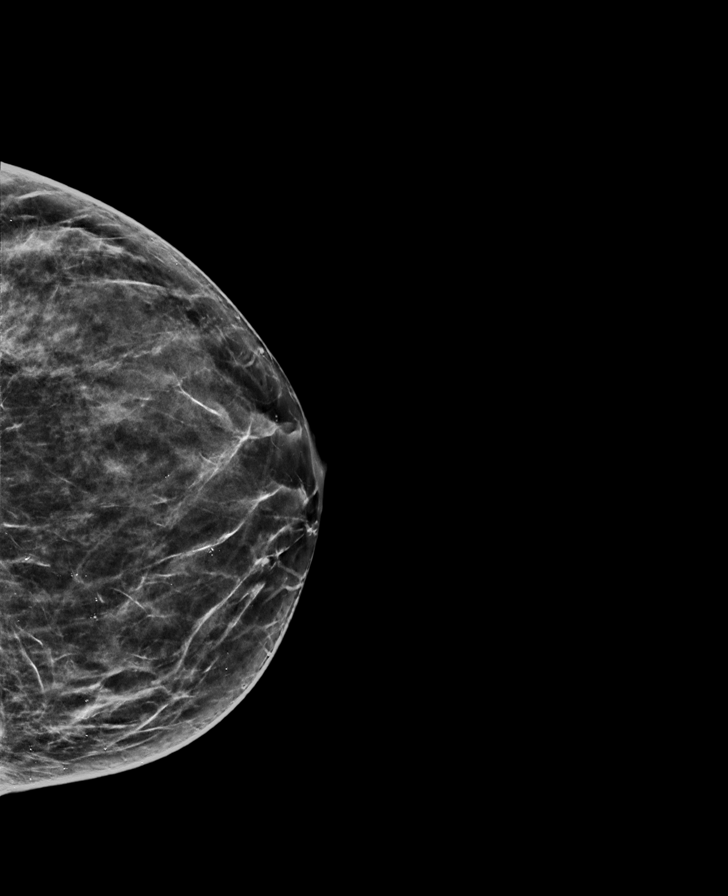

[R MLO synth-2D]
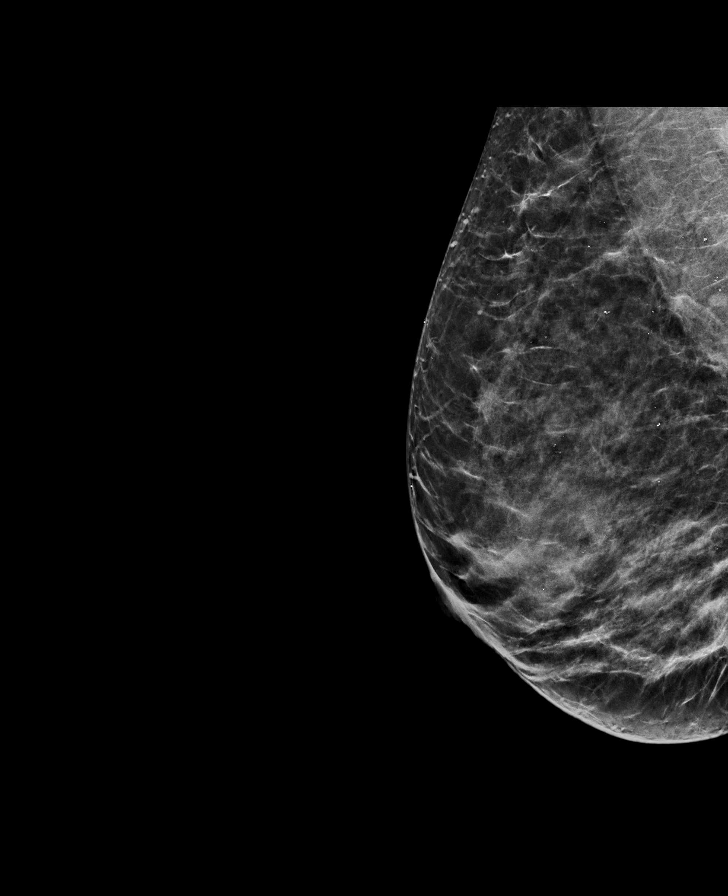

[R MLO]
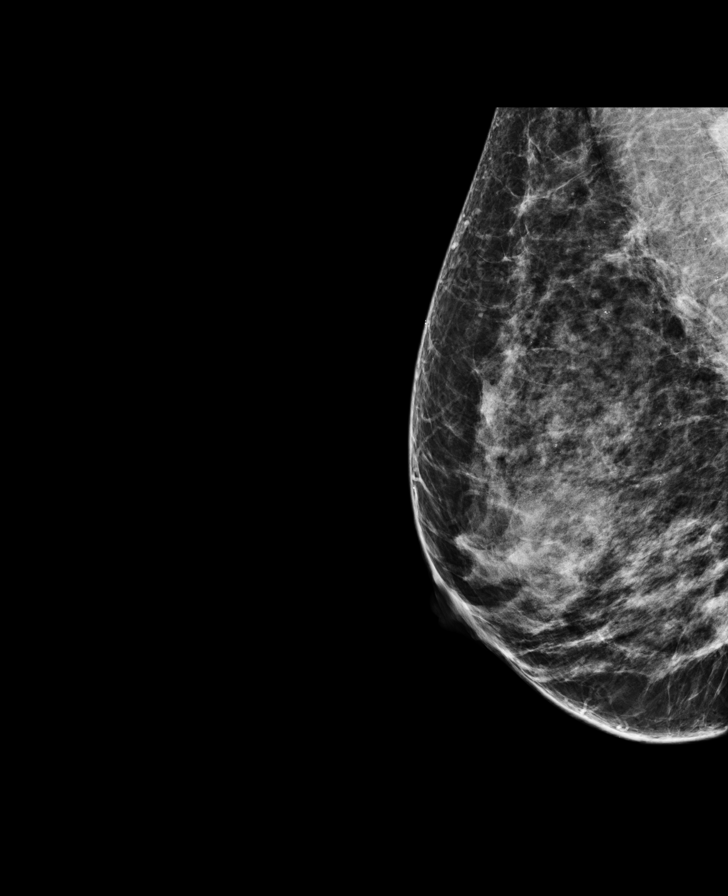

[L MLO (2 of 2)]
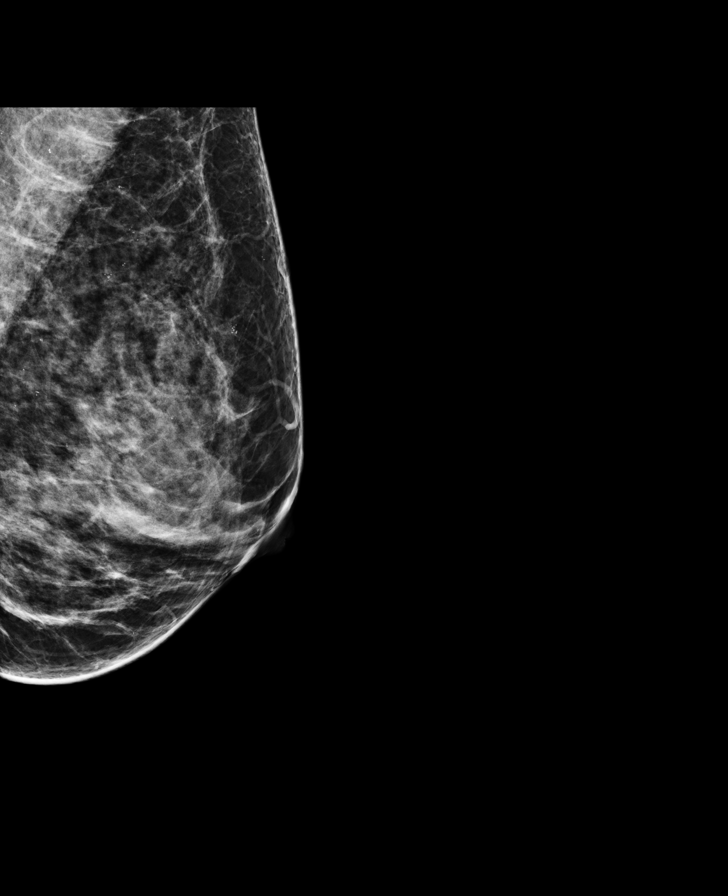

[L MLO synth-2D]
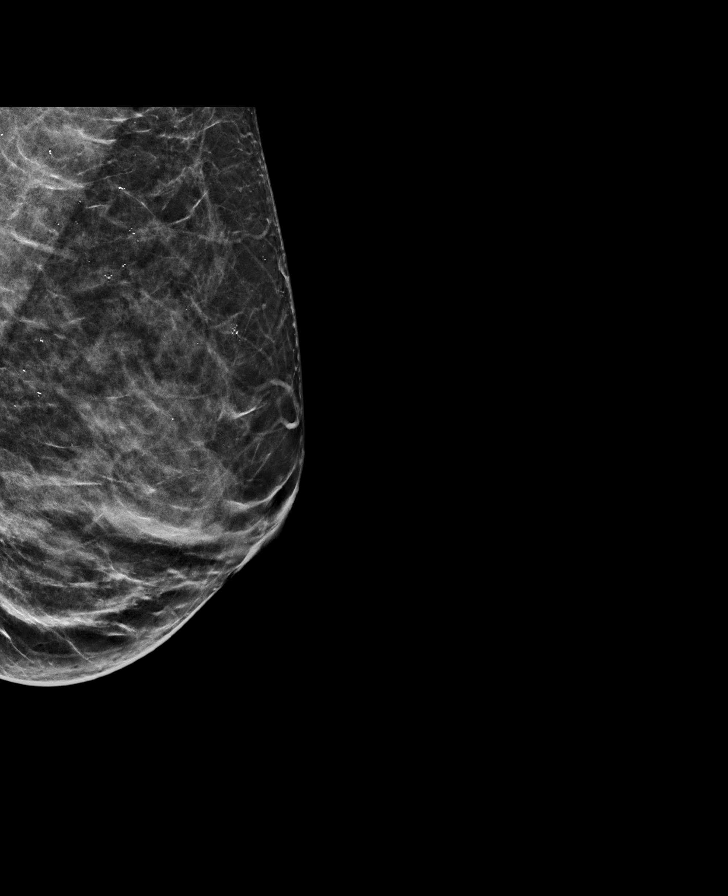

[8 of 29 positions shown; findings below may reference images not displayed]

ACR Breast Density Category c: The breast tissue is heterogeneously
dense, which may obscure small masses.
FINDINGS: There are no findings suspicious for malignancy. Images were
processed with CAD.
IMPRESSION: No mammographic evidence of malignancy. A result letter of this
screening mammogram will be mailed directly to the patient.

RECOMMENDATION:
Screening mammogram in one year. (Code:[TA])

BI-RADS CATEGORY  1: Negative.

## 2018-12-31 ENCOUNTER — Other Ambulatory Visit: Payer: Self-pay | Admitting: *Deleted

## 2018-12-31 DIAGNOSIS — Z1231 Encounter for screening mammogram for malignant neoplasm of breast: Secondary | ICD-10-CM

## 2019-01-08 ENCOUNTER — Other Ambulatory Visit: Payer: Self-pay | Admitting: *Deleted

## 2019-01-08 DIAGNOSIS — N6320 Unspecified lump in the left breast, unspecified quadrant: Secondary | ICD-10-CM

## 2019-01-14 ENCOUNTER — Ambulatory Visit
Admission: RE | Admit: 2019-01-14 | Discharge: 2019-01-14 | Disposition: A | Discharge status: Home / Self Care | Payer: BC Managed Care – PPO | Source: Ambulatory Visit | Attending: *Deleted | Admitting: *Deleted

## 2019-01-14 ENCOUNTER — Ambulatory Visit
Admission: RE | Admit: 2019-01-14 | Discharge: 2019-01-14 | Disposition: A | Discharge status: Home / Self Care | Payer: BLUE CROSS/BLUE SHIELD | Source: Ambulatory Visit | Attending: *Deleted | Admitting: *Deleted

## 2019-01-14 ENCOUNTER — Other Ambulatory Visit: Payer: Self-pay

## 2019-01-14 ENCOUNTER — Other Ambulatory Visit: Payer: Self-pay | Admitting: *Deleted

## 2019-01-14 DIAGNOSIS — N6320 Unspecified lump in the left breast, unspecified quadrant: Secondary | ICD-10-CM

## 2019-01-14 DIAGNOSIS — R599 Enlarged lymph nodes, unspecified: Secondary | ICD-10-CM

## 2019-01-14 IMAGING — MG DIGITAL DIAGNOSTIC BILAT W/ TOMO W/ CAD
8 of 15 series · 8 of 40 positions shown · non-contrast
Comparison: Previous exam(s).

CLINICAL DATA: 43-year-old female presenting for evaluation of a
palpable lump in the left breast. The patient identified this
approximately 2 weeks ago.

EXAM:
DIGITAL DIAGNOSTIC BILATERAL MAMMOGRAM WITH CAD AND TOMO
ULTRASOUND LEFT BREAST

[R CC synth-2D (1 of 2)]
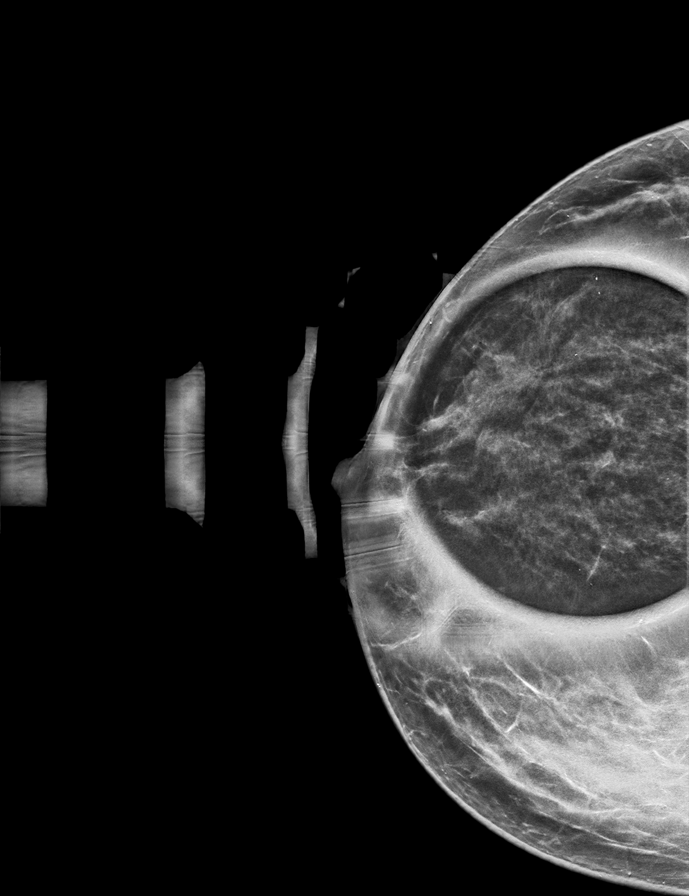

[R CC synth-2D (2 of 2)]
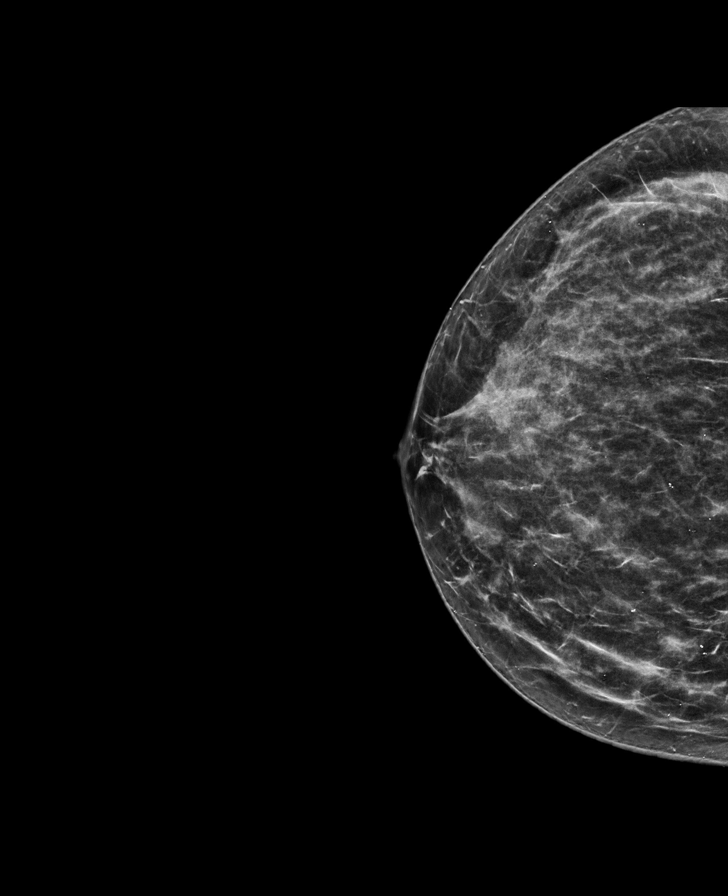

[R MLO synth-2D (1 of 3)]
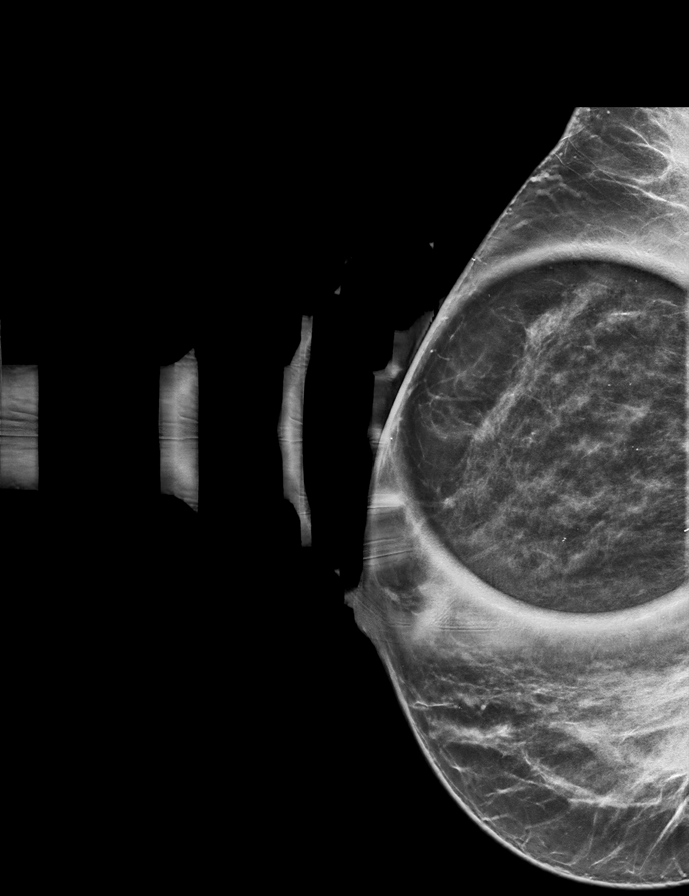

[R MLO synth-2D (2 of 3)]
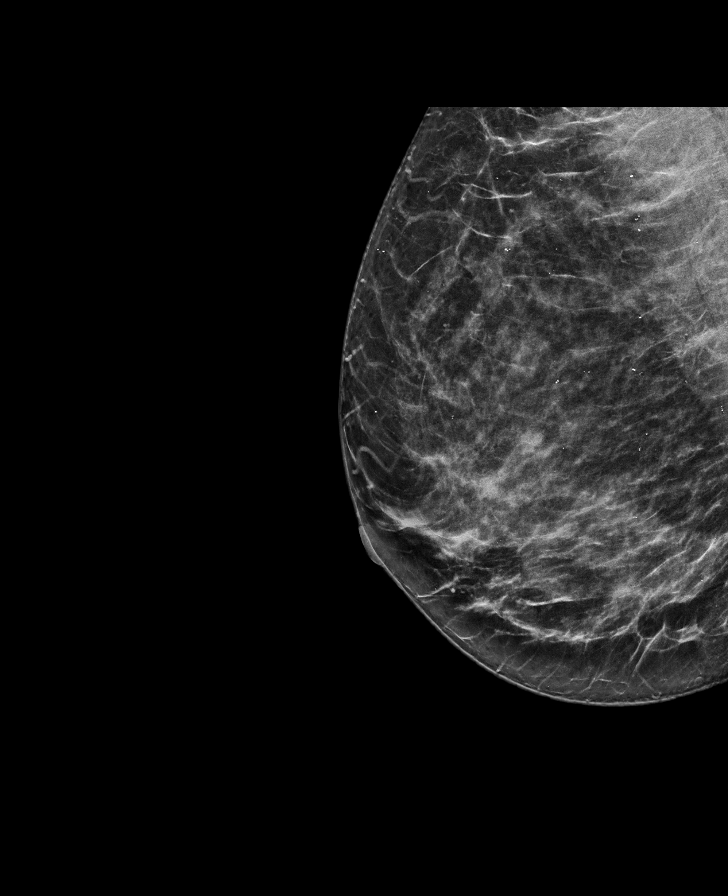

[L CC synth-2D]
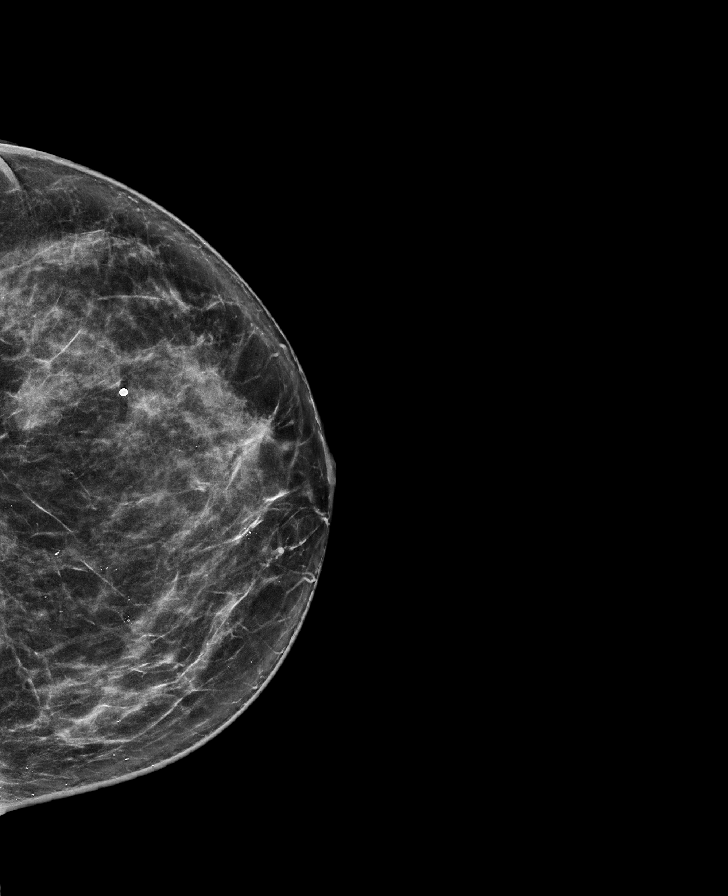

[L MLO synth-2D]
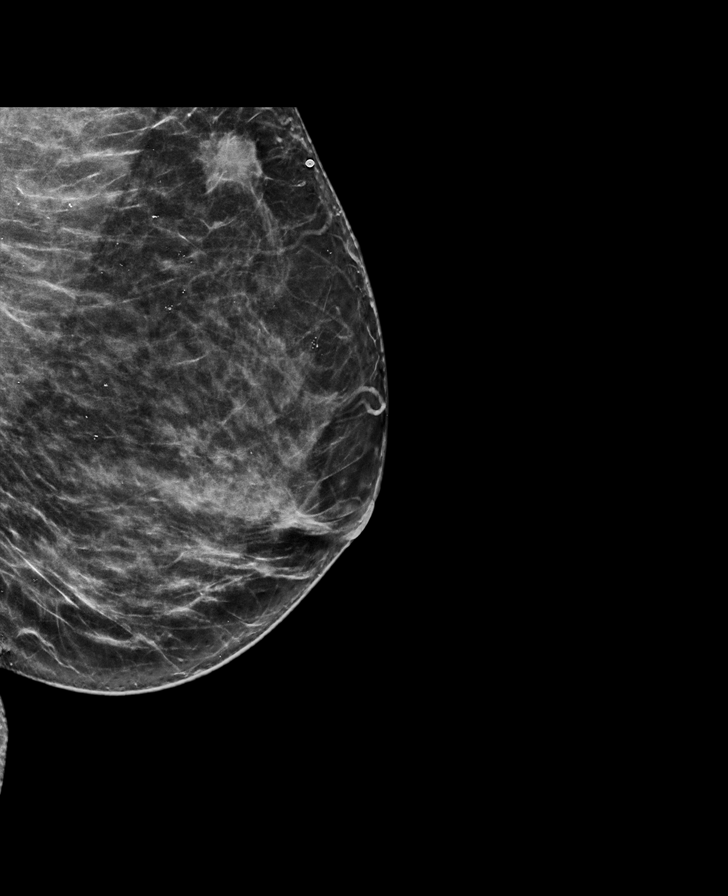

[R MLO synth-2D (3 of 3)]
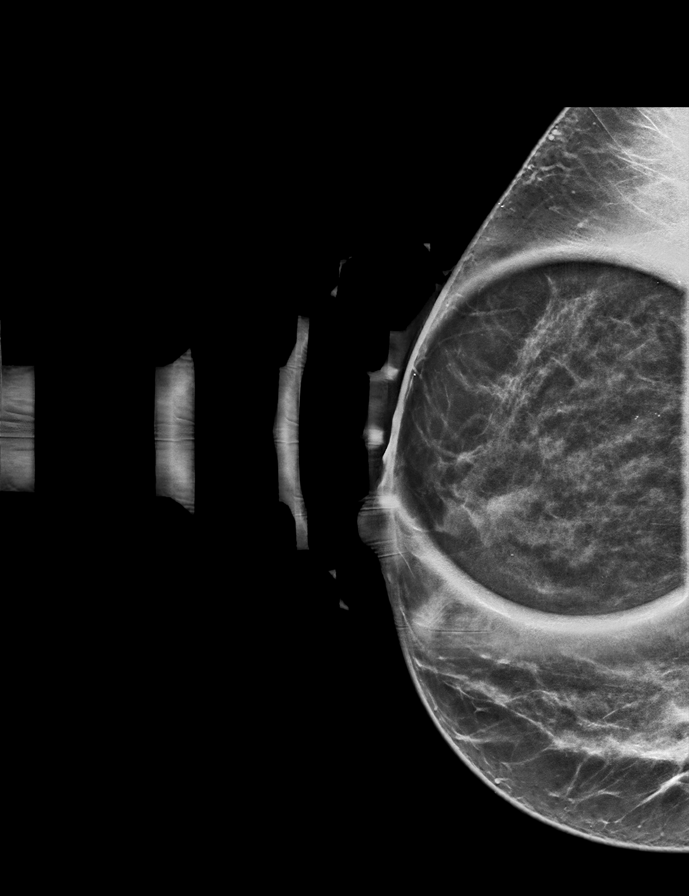

[R CC tomo · tomo slice 38/56.0]
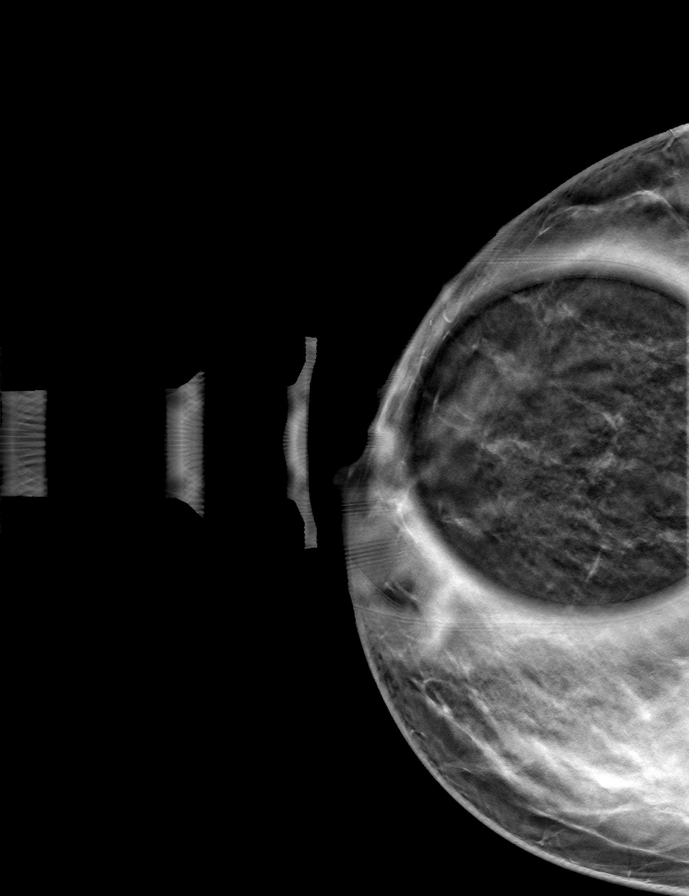

[8 of 40 positions shown; findings below may reference images not displayed]

ACR Breast Density Category c: The breast tissue is heterogeneously
dense, which may obscure small masses.
FINDINGS: A BB has been placed at the site of palpable concern in the
upper-outer left breast. Deep to this marker, there is an irregular
mass with spiculated margins measuring approximately 2.0 cm. There
is a small asymmetry on the MLO view in the central middle depth of
the right breast. This resolves with spot compression tomosynthesis
imaging, and no correlate is seen on the CC view. No other
suspicious calcifications, masses or areas of distortion are seen in
the bilateral breasts.

Mammographic images were processed with CAD.

On physical exam, there is a firm palpable mass in the superior left
breast.

Targeted ultrasound is performed, showing an irregular hypoechoic
mass with indistinct margins at 12 o'clock, 8 cm from the nipple.
The mass measures 2.3 x 1.5 x 1.4 cm. Blood flow is seen within the
mass on color Doppler imaging. Ultrasound of the left axilla
demonstrates 1 markedly enlarged lymph node measuring 3.5 x 2.3 x
2.6 cm.
IMPRESSION: 1. There is a highly suspicious mass at the palpable site of concern
in the left breast at 12 o'clock.

2.  There is 1 abnormal left axillary lymph node.

3.  No evidence of malignancy in the right breast.

RECOMMENDATION:
Ultrasound guided biopsy is recommended for the palpable left breast
mass and left axillary lymph node. The biopsy has been scheduled for
today at [DATE] p.m., and will be dictated in a separate report.

I have discussed the findings and recommendations with the patient.
If applicable, a reminder letter will be sent to the patient
regarding the next appointment.

BI-RADS CATEGORY  5: Highly suggestive of malignancy.

## 2019-01-14 IMAGING — US US BREAST BX W LOC DEV 1ST LESION IMG BX SPEC US GUIDE*L*
1 series · 12 of 21 positions shown · non-contrast
Comparison: Previous exam(s).
COMPARISON: Previous exam(s).

Addendum:
CLINICAL DATA: 43-year-old female presenting for ultrasound-guided
biopsy of a left breast mass and left axillary lymph node.

EXAM:
ULTRASOUND GUIDED LEFT BREAST CORE NEEDLE BIOPSY

[Series 1: us breast bx w loc dev 1st lesion img bx spec us g · 0.06mm/px · 12 of 21 slices shown]
[im 1/21]
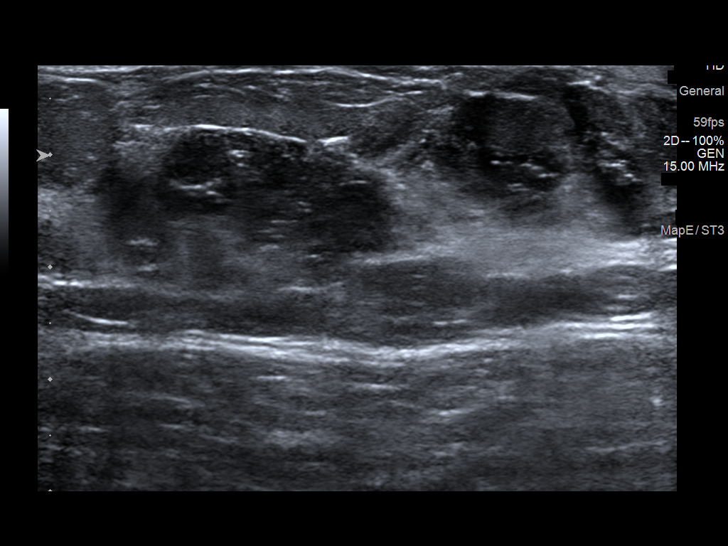
[im 3/21]
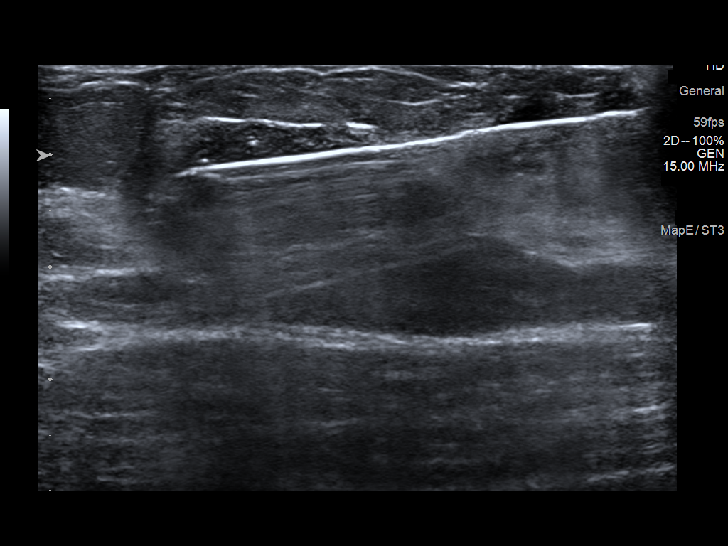
[im 5/21]
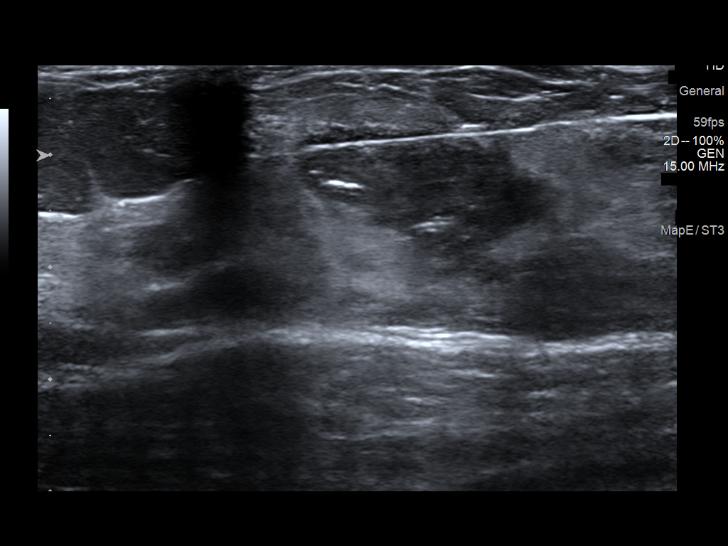
[im 7/21]
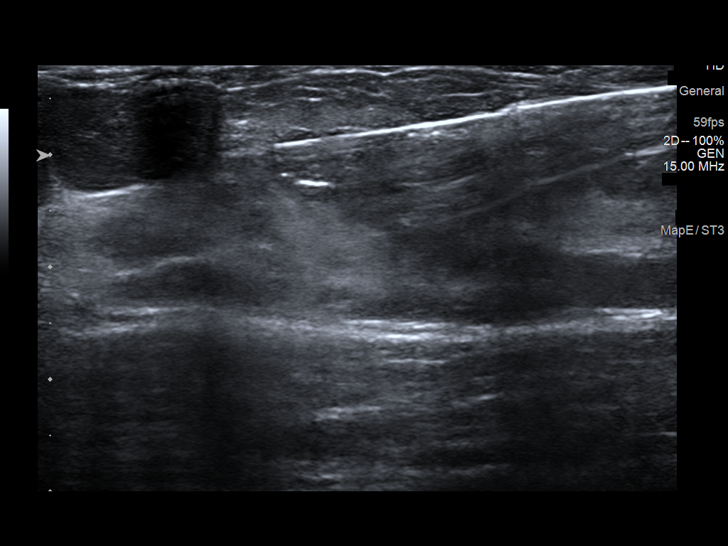
[im 8/21]
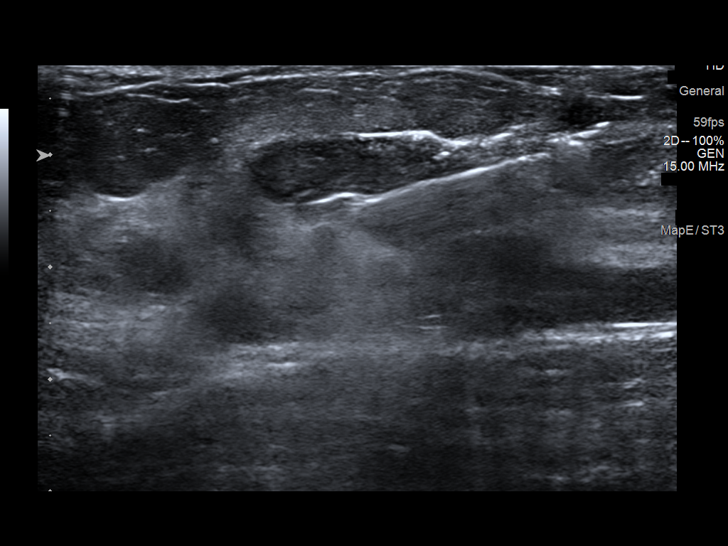
[im 10/21]
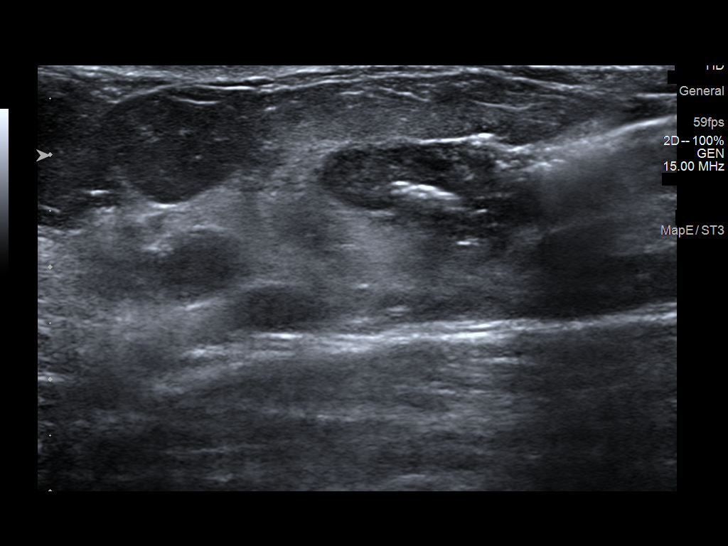
[im 12/21]
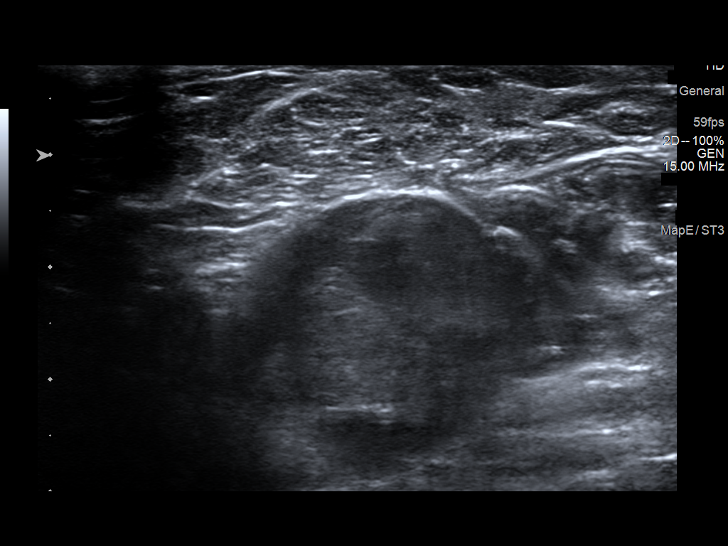
[im 14/21]
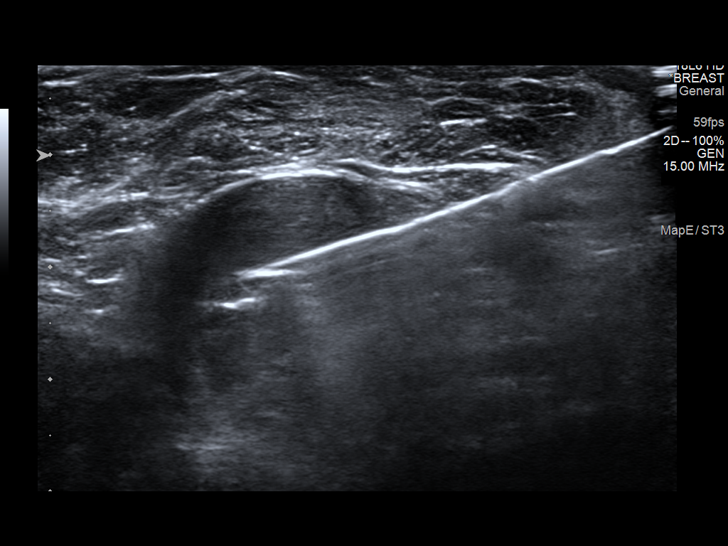
[im 15/21]
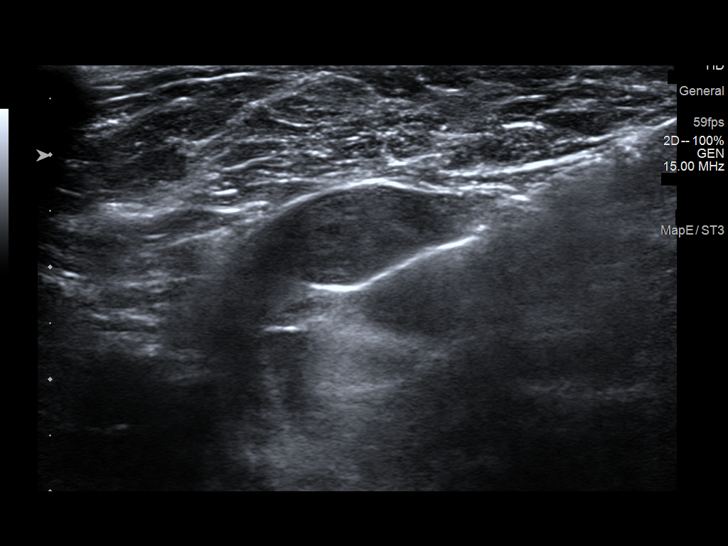
[im 17/21]
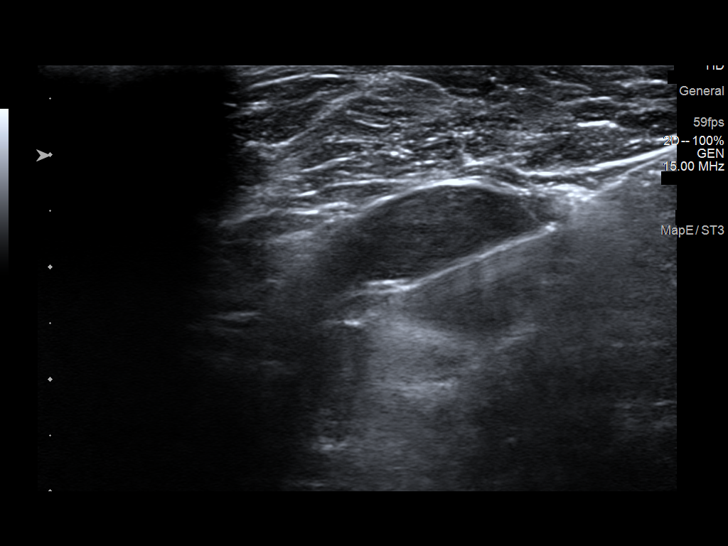
[im 19/21]
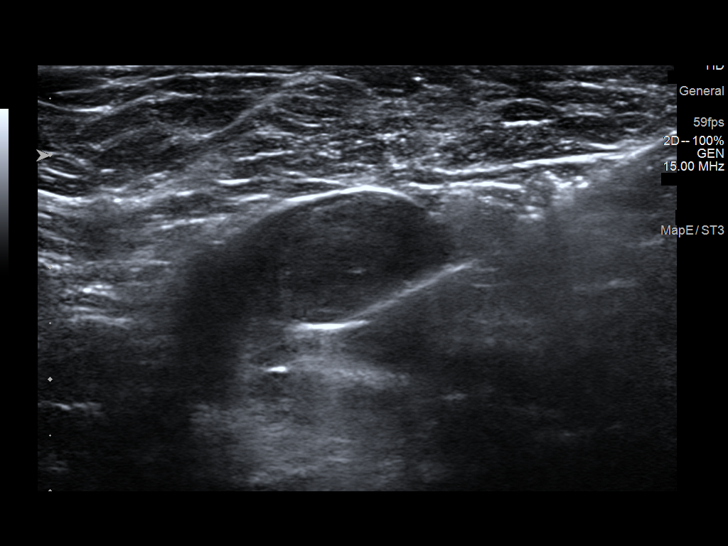
[im 21/21]
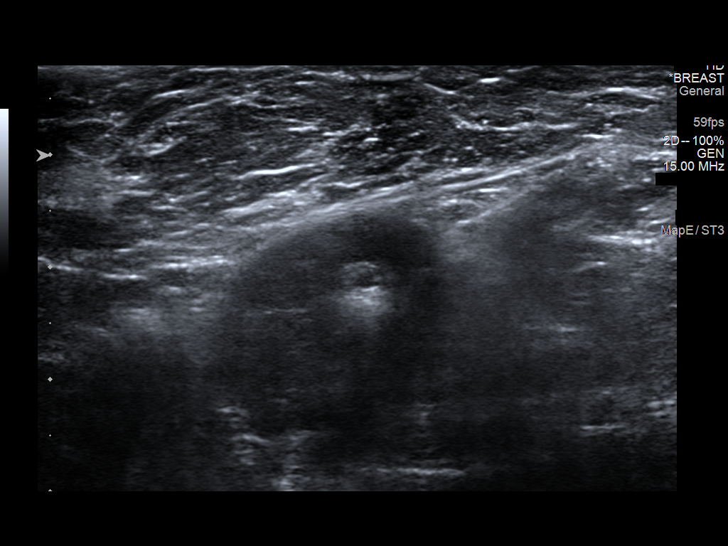

[12 of 21 positions shown; findings below may reference images not displayed]



#1 Lesion quadrant: Upper-outer quadrant

Using sterile technique and 1% Lidocaine as local anesthetic, under
direct ultrasound visualization, a 14 gauge ADELE device was
used to perform biopsy of a mass in the left breast at 12 o'clock
using a lateral approach. At the conclusion of the procedure a
ribbon shaped tissue marker clip was deployed into the biopsy
cavity.

--------------------------------------------------------------------------------------------------------------------------------------------

#2 Lesion quadrant: Left axilla

Using sterile technique and 1% Lidocaine as local anesthetic, under
direct ultrasound visualization, a 14 gauge ADELE device was
used to perform biopsy of a left axillary lymph node using a lateral
approach. At the conclusion of the procedure a Q shaped tissue
marker clip was deployed into the biopsy cavity.

Follow up 2 view mammogram was performed and dictated separately.
IMPRESSION: 1. Ultrasound guided biopsy of a left breast mass at 12 o'clock. No
apparent complications.

2. Ultrasound guided biopsy of a left axillary lymph node. No
apparent complications.

ADDENDUM:
Pathology revealed GRADE III INVASIVE DUCTAL CARCINOMA of the Left
breast, 12 o'clock. This was found to be concordant by Dr. ADELE
ADELE.

Pathology revealed METASTATIC CARCINOMA of the Left axillary node.
This was found to be concordant by Dr. ADELE.

Pathology results were discussed with the patient by telephone. The
patient reported doing well after the biopsies with tenderness at
the sites. Post biopsy instructions and care were reviewed and
questions were answered. The patient was encouraged to call The

The patient was referred to [REDACTED]
[REDACTED] at [REDACTED] on
[DATE].

Pathology results reported by ADELE, RN on [DATE].



#1 Lesion quadrant: Upper-outer quadrant

Using sterile technique and 1% Lidocaine as local anesthetic, under
direct ultrasound visualization, a 14 gauge ADELE device was
used to perform biopsy of a mass in the left breast at 12 o'clock
using a lateral approach. At the conclusion of the procedure a
ribbon shaped tissue marker clip was deployed into the biopsy
cavity.

--------------------------------------------------------------------------------------------------------------------------------------------

#2 Lesion quadrant: Left axilla

Using sterile technique and 1% Lidocaine as local anesthetic, under
direct ultrasound visualization, a 14 gauge ADELE device was
used to perform biopsy of a left axillary lymph node using a lateral
approach. At the conclusion of the procedure a Q shaped tissue
marker clip was deployed into the biopsy cavity.

Follow up 2 view mammogram was performed and dictated separately.
IMPRESSION: 1. Ultrasound guided biopsy of a left breast mass at 12 o'clock. No
apparent complications.

2. Ultrasound guided biopsy of a left axillary lymph node. No
apparent complications.

## 2019-01-14 IMAGING — MG MM BREAST LOCALIZATION CLIP
4 series · 4 of 12 positions shown · non-contrast
Comparison: Previous exam(s).

CLINICAL DATA: Post biopsy mammogram of the left breast clip
placement.

EXAM:
DIAGNOSTIC LEFT MAMMOGRAM POST ULTRASOUND BIOPSY

[L ML synth-2D]
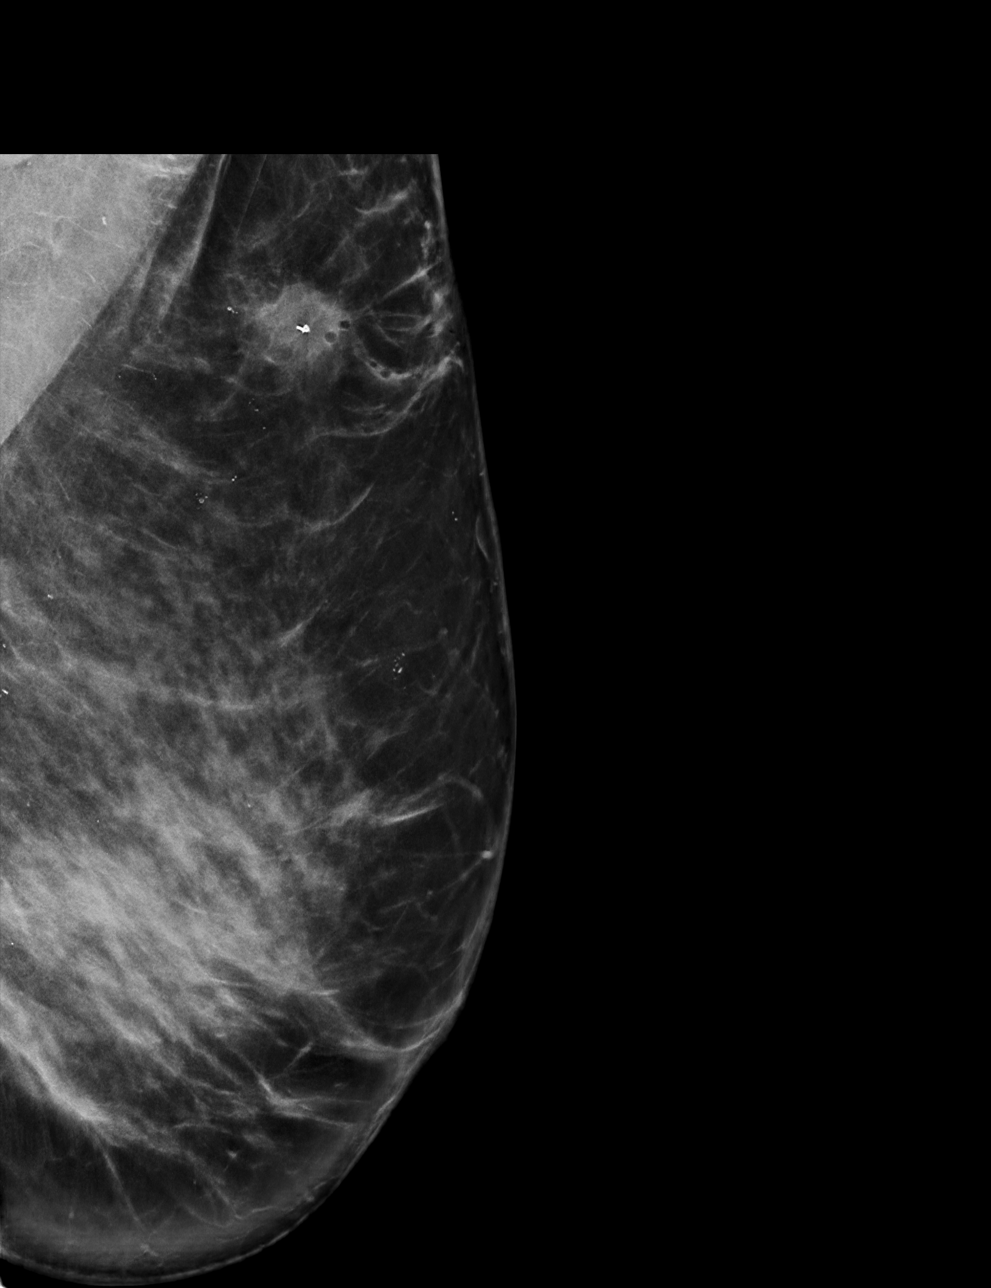

[L CC synth-2D]
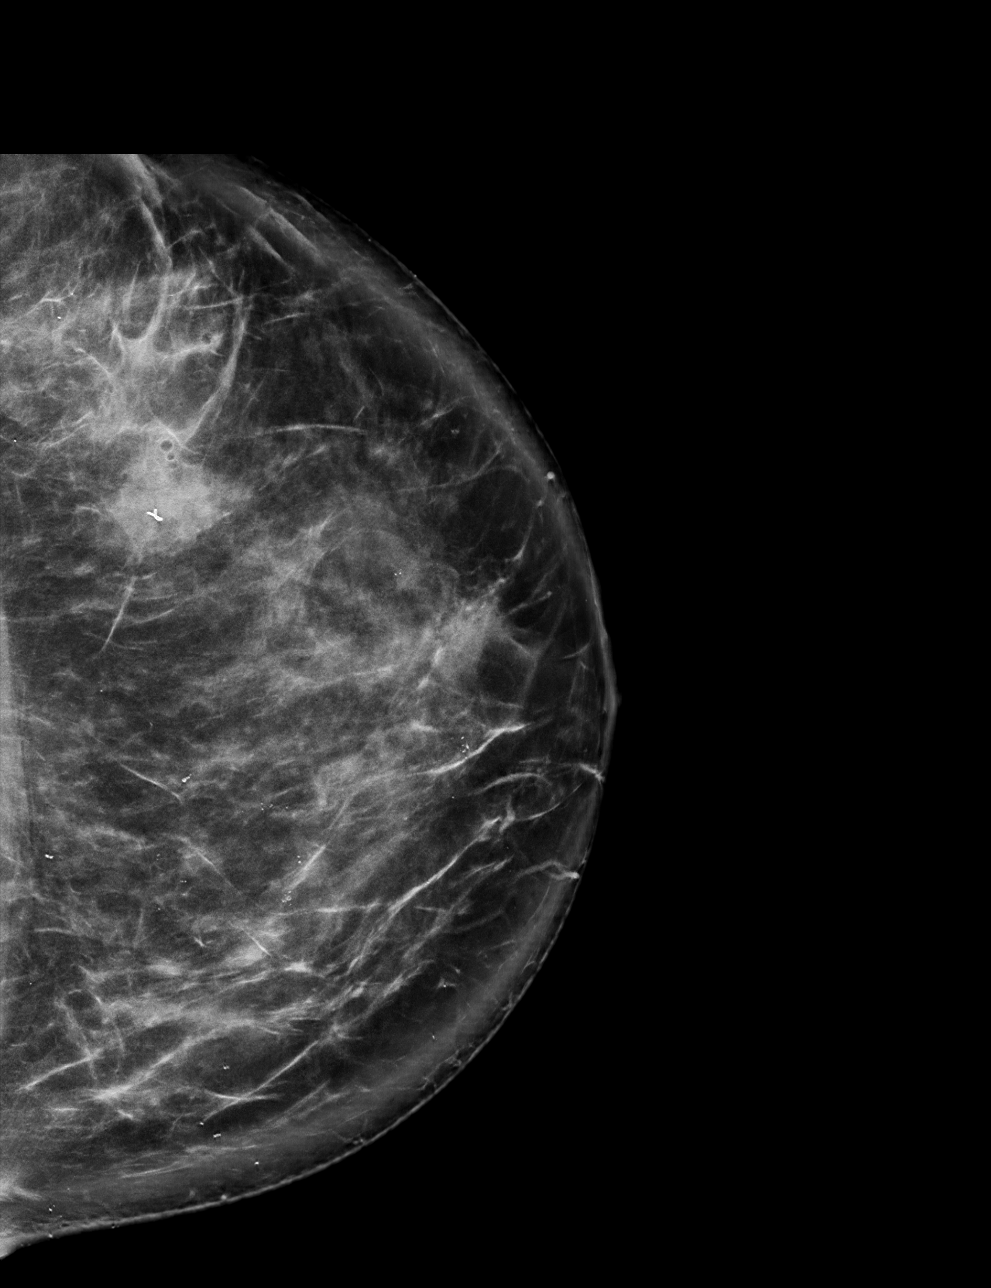

[L ML tomo · tomo slice 47/93.0]
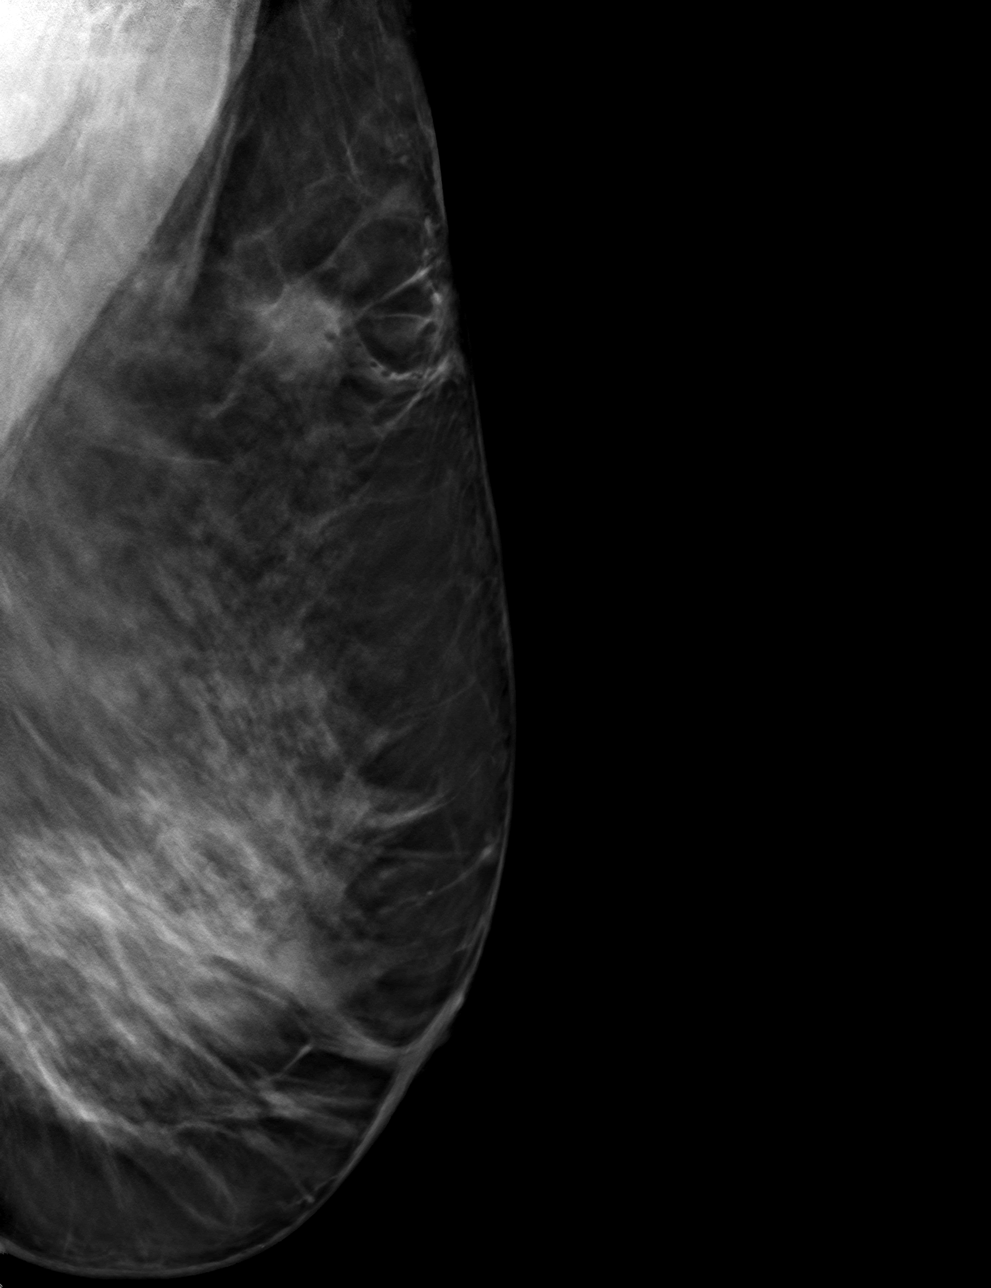

[L CC tomo · tomo slice 43/86.0]
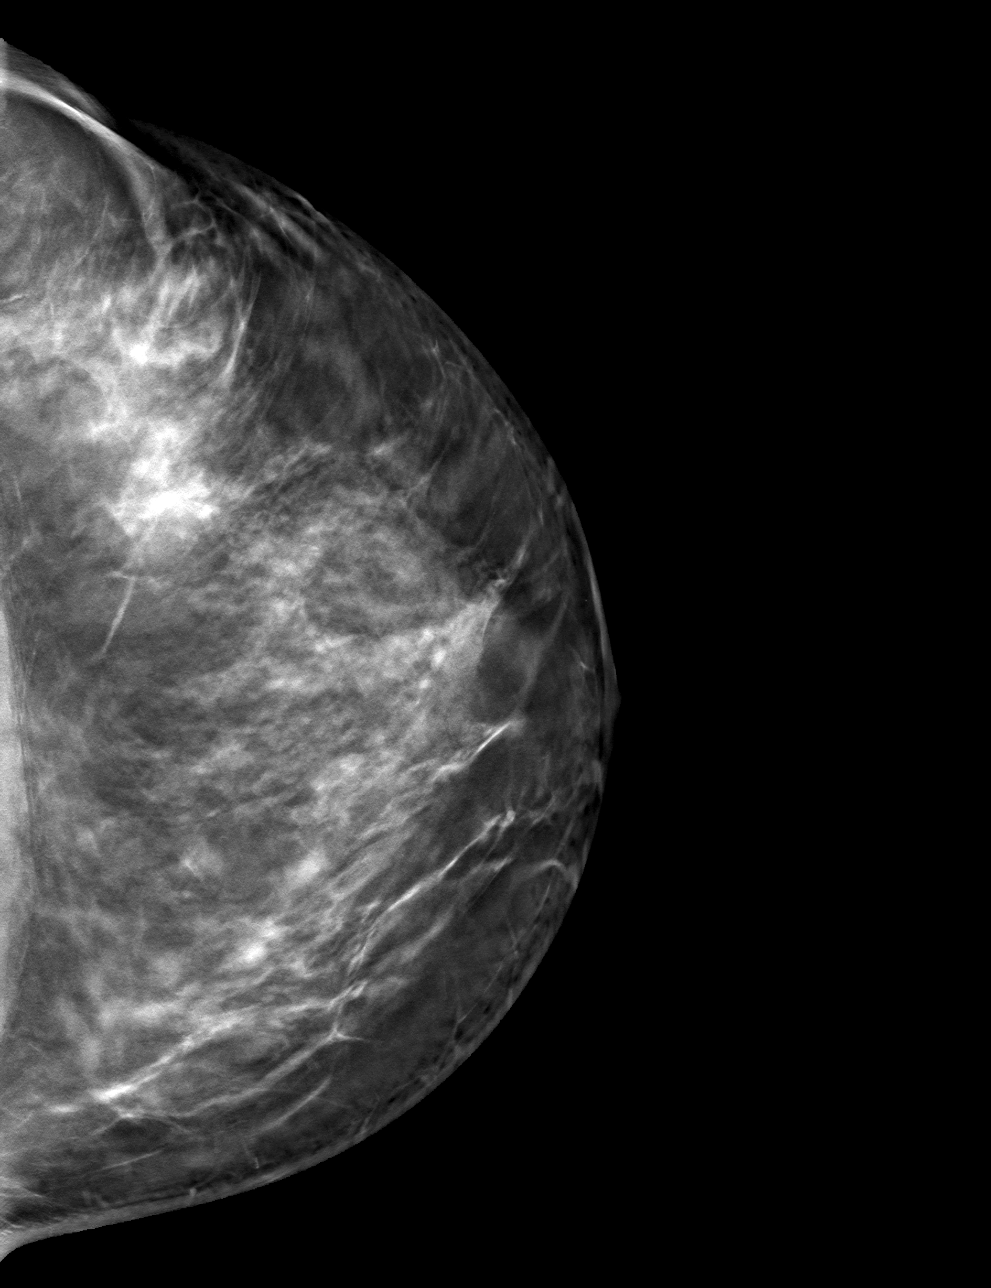

[4 of 12 positions shown; findings below may reference images not displayed]

FINDINGS: Mammographic images were obtained following ultrasound guided biopsy
of a left breast mass at 12 o'clock and a left axillary lymph node.
The ribbon shaped biopsy marking clip is in expected position at the
site of biopsy in the superior left breast. The Q shaped biopsy
marking clip is seen on the mL image within the left axillary lymph
node.
IMPRESSION: Appropriate positioning of the ribbon shaped shaped biopsy marking
clip at the site of biopsy in the right breast at 12 o'clock and of
the Q shaped biopsy marking clip in the left axillary lymph node.

Final Assessment: Post Procedure Mammograms for Marker Placement

## 2019-01-14 IMAGING — US US BREAST*L* LIMITED INC AXILLA
1 series · 11 of 11 positions shown · non-contrast
Comparison: Previous exam(s).

CLINICAL DATA: 43-year-old female presenting for evaluation of a
palpable lump in the left breast. The patient identified this
approximately 2 weeks ago.

EXAM:
DIGITAL DIAGNOSTIC BILATERAL MAMMOGRAM WITH CAD AND TOMO
ULTRASOUND LEFT BREAST

[Series 1: us breast*left* limited inc axilla · 0.06mm/px · 11 of 11 slices shown]
[im 1/11]
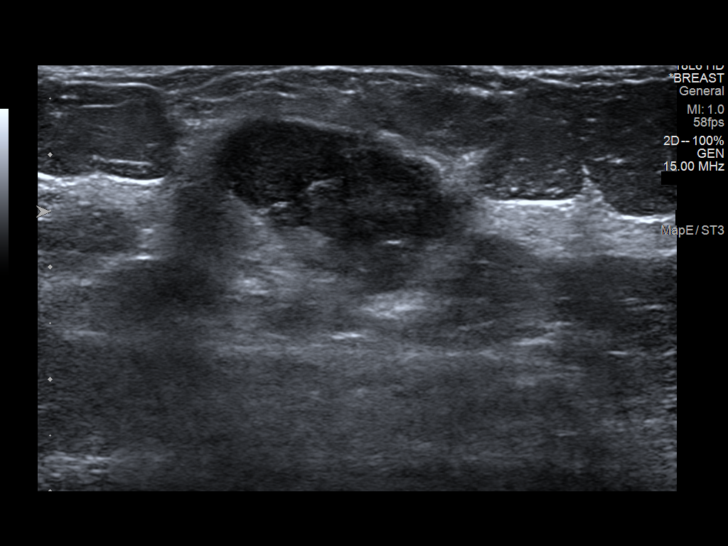
[im 2/11]
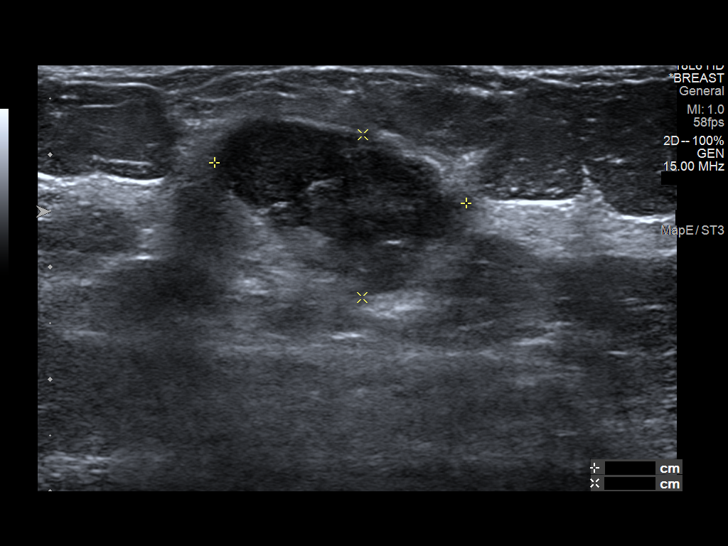
[im 3/11]
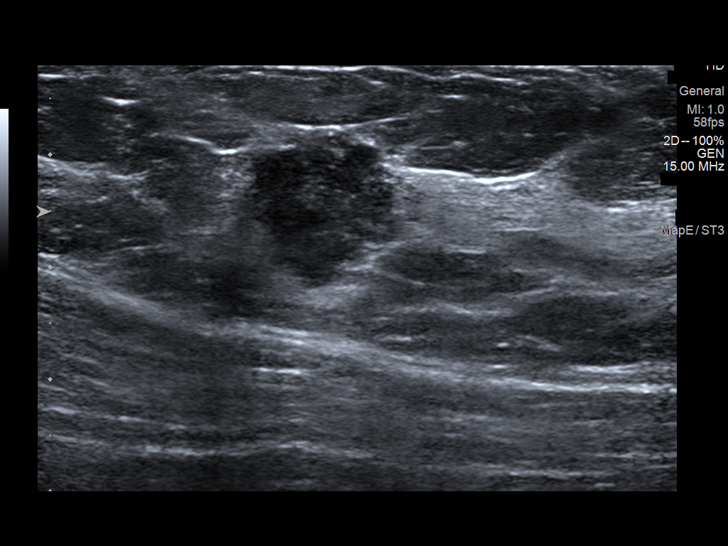
[im 4/11]
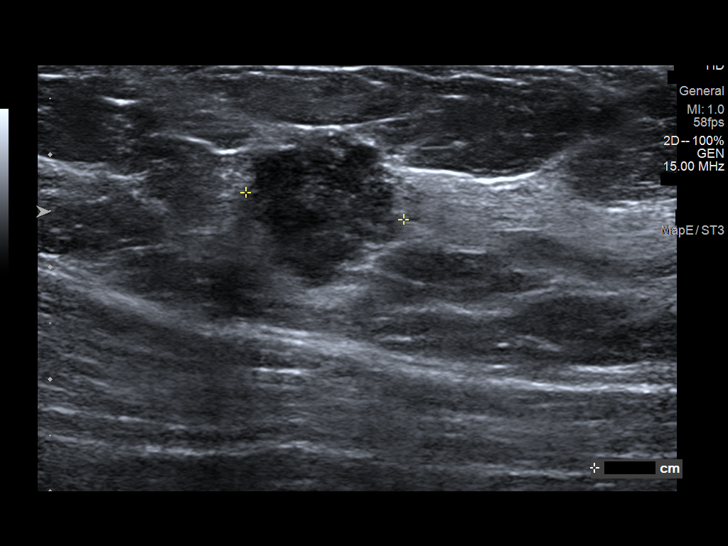
[im 5/11]
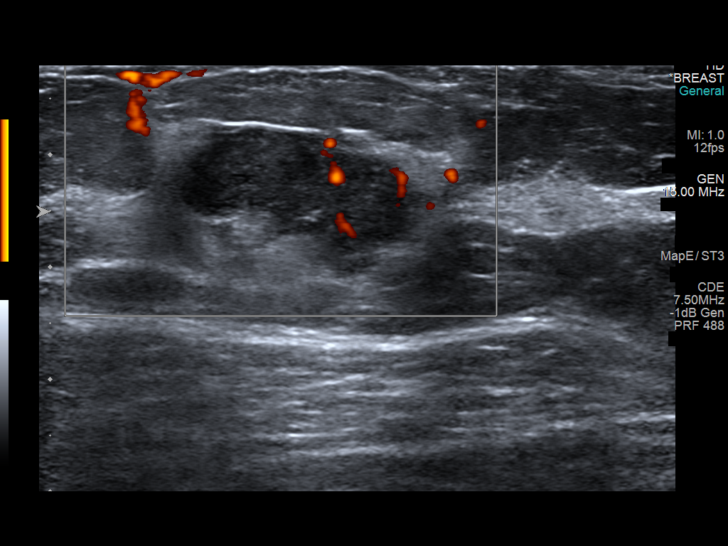
[im 6/11]
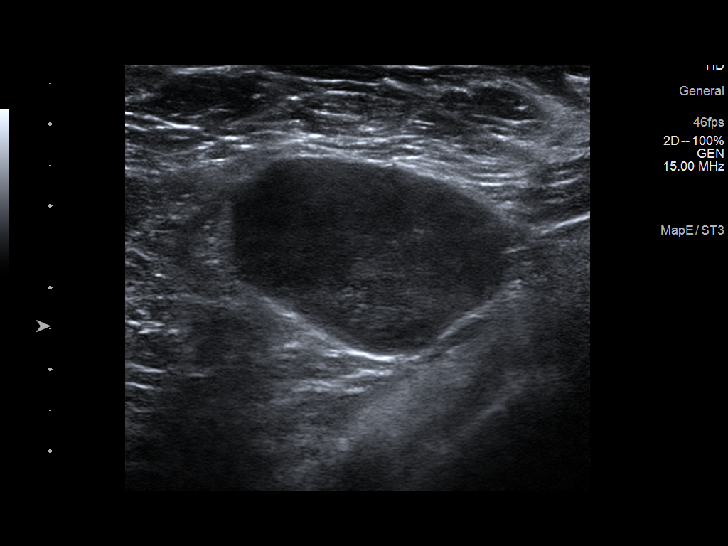
[im 7/11]
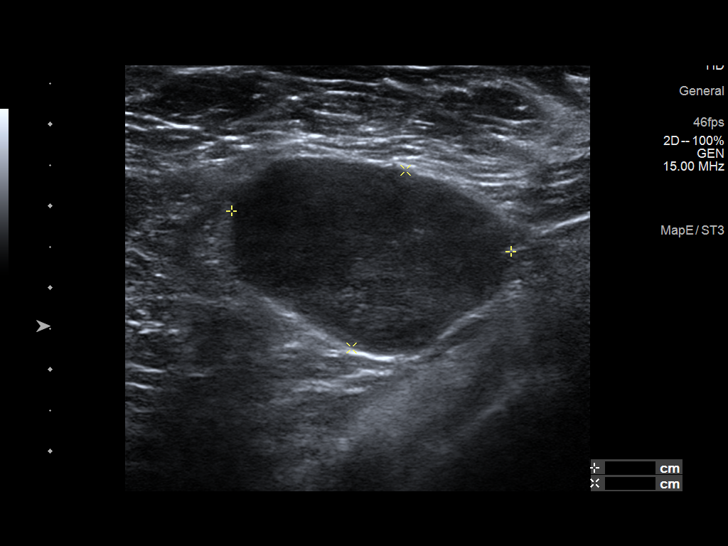
[im 8/11]
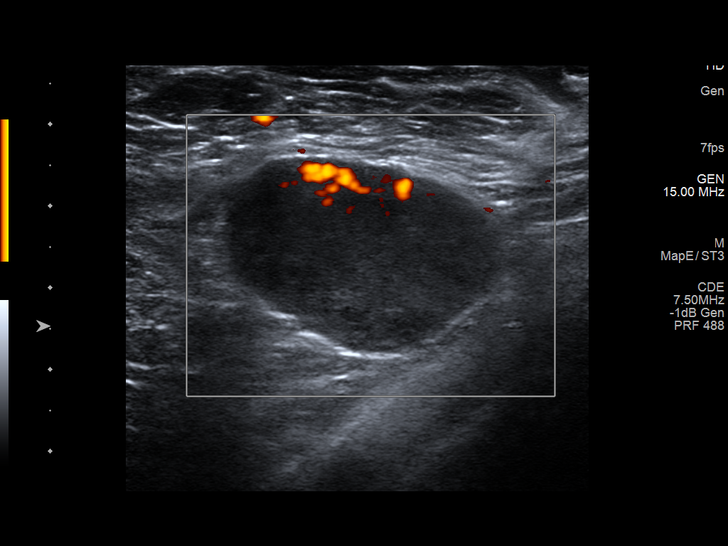
[im 9/11]
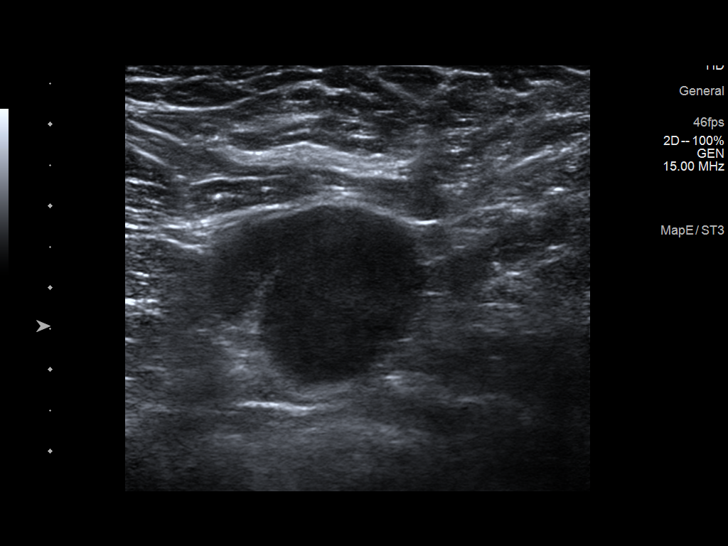
[im 10/11]
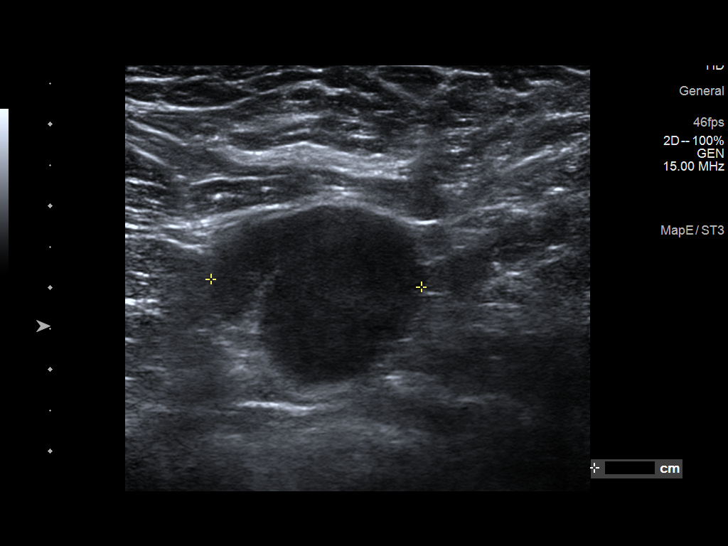
[im 11/11]
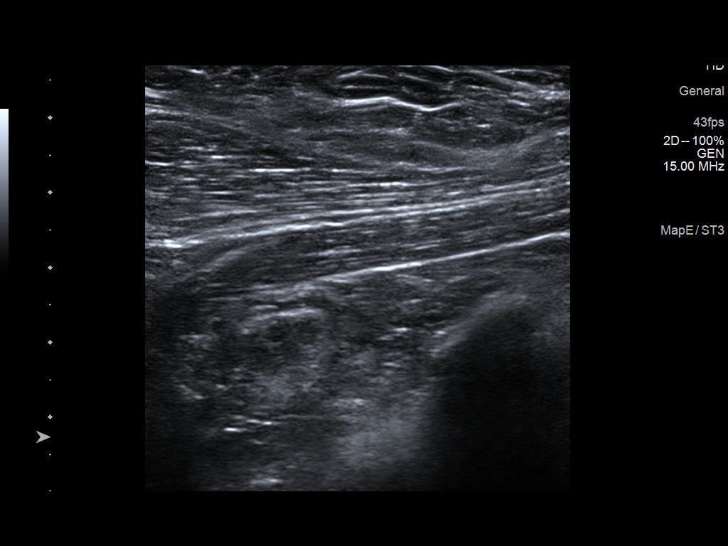

[11 of 11 positions shown; findings below may reference images not displayed]

ACR Breast Density Category c: The breast tissue is heterogeneously
dense, which may obscure small masses.
FINDINGS: A BB has been placed at the site of palpable concern in the
upper-outer left breast. Deep to this marker, there is an irregular
mass with spiculated margins measuring approximately 2.0 cm. There
is a small asymmetry on the MLO view in the central middle depth of
the right breast. This resolves with spot compression tomosynthesis
imaging, and no correlate is seen on the CC view. No other
suspicious calcifications, masses or areas of distortion are seen in
the bilateral breasts.

Mammographic images were processed with CAD.

On physical exam, there is a firm palpable mass in the superior left
breast.

Targeted ultrasound is performed, showing an irregular hypoechoic
mass with indistinct margins at 12 o'clock, 8 cm from the nipple.
The mass measures 2.3 x 1.5 x 1.4 cm. Blood flow is seen within the
mass on color Doppler imaging. Ultrasound of the left axilla
demonstrates 1 markedly enlarged lymph node measuring 3.5 x 2.3 x
2.6 cm.
IMPRESSION: 1. There is a highly suspicious mass at the palpable site of concern
in the left breast at 12 o'clock.

2.  There is 1 abnormal left axillary lymph node.

3.  No evidence of malignancy in the right breast.

RECOMMENDATION:
Ultrasound guided biopsy is recommended for the palpable left breast
mass and left axillary lymph node. The biopsy has been scheduled for
today at [DATE] p.m., and will be dictated in a separate report.

I have discussed the findings and recommendations with the patient.
If applicable, a reminder letter will be sent to the patient
regarding the next appointment.

BI-RADS CATEGORY  5: Highly suggestive of malignancy.

## 2019-01-16 ENCOUNTER — Encounter: Payer: Self-pay | Admitting: *Deleted

## 2019-01-18 ENCOUNTER — Other Ambulatory Visit: Payer: Self-pay | Admitting: *Deleted

## 2019-01-18 DIAGNOSIS — C50412 Malignant neoplasm of upper-outer quadrant of left female breast: Secondary | ICD-10-CM | POA: Insufficient documentation

## 2019-01-18 DIAGNOSIS — Z171 Estrogen receptor negative status [ER-]: Secondary | ICD-10-CM | POA: Insufficient documentation

## 2019-01-18 DIAGNOSIS — Z170 Estrogen receptor positive status [ER+]: Secondary | ICD-10-CM

## 2019-01-22 NOTE — Progress Notes (Signed)
[facility] CONSULT NOTE  Patient Care Team: Servando Salina, MD as PCP - General (Obstetrics and Gynecology) Mauro Kaufmann, RN as Oncology Nurse Navigator Rockwell Germany, RN as Oncology Nurse Navigator Donnie Mesa, MD as Consulting Physician (General Surgery) Nicholas Lose, MD as Consulting Physician (Hematology and Oncology) Kyung Rudd, MD as Consulting Physician (Radiation Oncology)  CHIEF COMPLAINTS/PURPOSE OF CONSULTATION:  Newly diagnosed breast cancer  HISTORY OF PRESENTING ILLNESS:  Paige Perry 43 y.o. female is here because of recent diagnosis of invasive ductal carcinoma of the left breast. The patient palpated a left breast lump for 2 weeks. Diagnostic mammogram on 01/14/19 showed a 2.3cm mass in the left breast at the 12 o'clock position, with 1 enlarged left axillary lymph node measuring 3.5cm. Biopsy on 01/14/19 showed invasive ductal carcinoma in the left breast and axilla. She presents to the clinic today for initial evaluation and discussion of treatment options.   I reviewed her records extensively and collaborated the history with the patient.  SUMMARY OF ONCOLOGIC HISTORY: Oncology History  Malignant neoplasm of upper-outer quadrant of left breast in female, estrogen receptor positive ([place])  01/18/2019 Initial Diagnosis   Patient palpated a left breast lump for 2 weeks. Mammogram showed a 2.3cm left breast mass at the 12 o'clock position, with 1 enlarged left axillary lymph node measuring 3.5cm. 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   01/23/2019 Cancer Staging   Staging form: Breast, AJCC 8th Edition - Clinical stage from 01/23/2019: Stage IIA (cT2, cN1(f), cM0, G2, ER+, PR-, HER2+) - Signed by Nicholas Lose, MD on 01/23/2019     MEDICAL HISTORY:  Past Medical History:  Diagnosis Date  . Hypercholesteremia     SURGICAL HISTORY: Past Surgical History:  Procedure Laterality Date  . FINGER SURGERY       SOCIAL HISTORY: Social History   Socioeconomic History  . Marital status: Single    Spouse name: Not on file  . Number of children: Not on file  . Years of education: Not on file  . Highest education level: Not on file  Occupational History  . Not on file  Social Needs  . Financial resource strain: Not on file  . Food insecurity    Worry: Not on file    Inability: Not on file  . Transportation needs    Medical: Not on file    Non-medical: Not on file  Tobacco Use  . Smoking status: Never Smoker  . Smokeless tobacco: Never Used  Substance and Sexual Activity  . Alcohol use: Yes    Comment: occasionally  . Drug use: No  . Sexual activity: Yes    Birth control/protection: None  Lifestyle  . Physical activity    Days per week: Not on file    Minutes per session: Not on file  . Stress: Not on file  Relationships  . Social Herbalist on phone: Not on file    Gets together: Not on file    Attends religious service: Not on file    Active member of club or organization: Not on file    Attends meetings of clubs or organizations: Not on file    Relationship status: Not on file  . Intimate partner violence    Fear of current or ex partner: Not on file    Emotionally abused: Not on file    Physically abused: Not on file    Forced sexual activity: Not on file  Other Topics Concern  . Not on file  Social History Narrative  . Not on file    FAMILY HISTORY: Family History  Problem Relation Age of Onset  . Hypertension Mother   . Heart failure Mother   . Breast cancer Maternal Aunt   . Breast cancer Paternal Aunt     ALLERGIES:  is allergic to other.  MEDICATIONS:  Current Outpatient Medications  Medication Sig Dispense Refill  . cetirizine (ZYRTEC) 10 MG tablet Take 10 mg by mouth as needed.     . Etonogestrel-Ethinyl Estradiol (NUVARING VA) Place vaginally. Stays in for three weeks then take it out for one week.    Marland Kitchen ibuprofen (ADVIL,MOTRIN) 400  MG tablet Take 1 tablet (400 mg total) by mouth every 6 (six) hours as needed for pain. 30 tablet 0  . nabumetone (RELAFEN) 500 MG tablet Take 1,000 mg by mouth 2 (two) times daily.    . rosuvastatin (CRESTOR) 20 MG tablet Take 20 mg by mouth daily.     No current facility-administered medications for this visit.     REVIEW OF SYSTEMS:   Constitutional: Denies fevers, chills or abnormal night sweats Eyes: Denies blurriness of vision, double vision or watery eyes Ears, nose, mouth, throat, and face: Denies mucositis or sore throat Respiratory: Denies cough, dyspnea or wheezes Cardiovascular: Denies palpitation, chest discomfort or lower extremity swelling Gastrointestinal:  Denies nausea, heartburn or change in bowel habits Skin: Denies abnormal skin rashes Lymphatics: Denies new lymphadenopathy or easy bruising Neurological:Denies numbness, tingling or new weaknesses Behavioral/Psych: Mood is stable, no new changes  Breast: Palpable left breast mass All other systems were reviewed with the patient and are negative.  PHYSICAL EXAMINATION: ECOG PERFORMANCE STATUS: 1 - Symptomatic but completely ambulatory  Vitals:   01/23/19 1256  BP: 115/80  Pulse: 83  Resp: 18  Temp: 97.8 F (36.6 C)  SpO2: 100%   Filed Weights   01/23/19 1256  Weight: 177 lb (80.3 kg)    GENERAL:alert, no distress and comfortable SKIN: skin color, texture, turgor are normal, no rashes or significant lesions EYES: normal, conjunctiva are pink and non-injected, sclera clear OROPHARYNX:no exudate, no erythema and lips, buccal mucosa, and tongue normal  NECK: supple, thyroid normal size, non-tender, without nodularity LYMPH:  no palpable lymphadenopathy in the cervical, axillary or inguinal LUNGS: clear to auscultation and percussion with normal breathing effort HEART: regular rate & rhythm and no murmurs and no lower extremity edema ABDOMEN:abdomen soft, non-tender and normal bowel sounds  Musculoskeletal:no cyanosis of digits and no clubbing  PSYCH: alert & oriented x 3 with fluent speech NEURO: no focal motor/sensory deficits BREAST: Palpable lump in the breast and axilla.. No palpable axillary or supraclavicular lymphadenopathy (exam performed in the presence of a chaperone)   LABORATORY DATA:  I have reviewed the data as listed Lab Results  Component Value Date   WBC 5.4 01/23/2019   HGB 13.2 01/23/2019   HCT 40.0 01/23/2019   MCV 90.5 01/23/2019   PLT 322 01/23/2019   Lab Results  Component Value Date   NA 140 01/23/2019   K 3.9 01/23/2019   CL 105 01/23/2019   CO2 25 01/23/2019    RADIOGRAPHIC STUDIES: I have personally reviewed the radiological reports and agreed with the findings in the report.  ASSESSMENT AND PLAN:  Malignant neoplasm of upper-outer quadrant of left breast in female, estrogen receptor positive ([place]) 01/18/2019: Patient palpated a left breast lump for 2 weeks. Mammogram showed a 2.3cm left breast mass  at the 12 o'clock position, with 1 enlarged left axillary lymph node measuring 3.5cm. 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 T2N1 stage IIa clinical stage  Pathology and radiology counseling: Discussed with the patient, the details of pathology including the type of breast cancer,the clinical staging, the significance of ER, PR and HER-2/neu receptors and the implications for treatment. After reviewing the pathology in detail, we proceeded to discuss the different treatment options between surgery, radiation, chemotherapy, antiestrogen therapies.  Recommendation based on multidisciplinary tumor board: 1. Neoadjuvant chemotherapy with TCH Perjeta 6 cycles followed by Herceptin maintenance for 1 year 2. Followed by breast conserving surgery if possible with sentinel lymph node study 3. Followed by adjuvant radiation therapy if patient had lumpectomy  Chemotherapy Counseling: I discussed the risks and benefits  of chemotherapy including the risks of nausea/ vomiting, risk of infection from low WBC count, fatigue due to chemo or anemia, bruising or bleeding due to low platelets, mouth sores, loss/ change in taste and decreased appetite. Liver and kidney function will be monitored through out chemotherapy as abnormalities in liver and kidney function may be a side effect of treatment. Cardiac dysfunction due to Herceptin and Perjeta were discussed in detail. Risk of permanent bone marrow dysfunction due to chemo were also discussed.  Plan: 1. Port placement 2. Echocardiogram 3. Chemotherapy class 4. Breast MRI 5.  Genetic testing  Recommended participation in the neuropathy clinical trial SWOG [id number]  Return to clinic in 2 weeks to start chemotherapy.     All questions were answered. The patient knows to call the clinic with any problems, questions or concerns.   Rulon Eisenmenger, MD, MPH 01/23/2019    I, Molly Dorshimer, am acting as scribe for Nicholas Lose, MD.  I have reviewed the above documentation for accuracy and completeness, and I agree with the above.

## 2019-01-23 ENCOUNTER — Inpatient Hospital Stay: Payer: BC Managed Care – PPO

## 2019-01-23 ENCOUNTER — Ambulatory Visit: Payer: Self-pay | Admitting: Surgery

## 2019-01-23 ENCOUNTER — Inpatient Hospital Stay: Payer: BC Managed Care – PPO | Attending: Hematology and Oncology | Admitting: Hematology and Oncology

## 2019-01-23 ENCOUNTER — Ambulatory Visit
Admission: RE | Admit: 2019-01-23 | Discharge: 2019-01-23 | Disposition: A | Discharge status: Home / Self Care | Payer: BC Managed Care – PPO | Source: Ambulatory Visit | Attending: Radiation Oncology | Admitting: Radiation Oncology

## 2019-01-23 ENCOUNTER — Other Ambulatory Visit: Payer: Self-pay

## 2019-01-23 ENCOUNTER — Ambulatory Visit: Payer: BC Managed Care – PPO | Admitting: Licensed Clinical Social Worker

## 2019-01-23 ENCOUNTER — Encounter: Payer: Self-pay | Admitting: *Deleted

## 2019-01-23 ENCOUNTER — Encounter: Payer: Self-pay | Admitting: Licensed Clinical Social Worker

## 2019-01-23 ENCOUNTER — Ambulatory Visit: Payer: BC Managed Care – PPO | Attending: Surgery | Admitting: Physical Therapy

## 2019-01-23 ENCOUNTER — Encounter: Payer: Self-pay | Admitting: General Practice

## 2019-01-23 ENCOUNTER — Encounter: Payer: Self-pay | Admitting: Physical Therapy

## 2019-01-23 ENCOUNTER — Encounter: Payer: Self-pay | Admitting: Hematology and Oncology

## 2019-01-23 VITALS — BP 115/80 | HR 83 | Temp 97.8°F | Resp 18 | Ht 63.0 in | Wt 177.0 lb

## 2019-01-23 DIAGNOSIS — C50412 Malignant neoplasm of upper-outer quadrant of left female breast: Secondary | ICD-10-CM

## 2019-01-23 DIAGNOSIS — Z170 Estrogen receptor positive status [ER+]: Secondary | ICD-10-CM

## 2019-01-23 DIAGNOSIS — Z793 Long term (current) use of hormonal contraceptives: Secondary | ICD-10-CM

## 2019-01-23 DIAGNOSIS — Z8249 Family history of ischemic heart disease and other diseases of the circulatory system: Secondary | ICD-10-CM | POA: Insufficient documentation

## 2019-01-23 DIAGNOSIS — Z79899 Other long term (current) drug therapy: Secondary | ICD-10-CM | POA: Insufficient documentation

## 2019-01-23 DIAGNOSIS — E785 Hyperlipidemia, unspecified: Secondary | ICD-10-CM | POA: Insufficient documentation

## 2019-01-23 DIAGNOSIS — Z791 Long term (current) use of non-steroidal anti-inflammatories (NSAID): Secondary | ICD-10-CM | POA: Diagnosis not present

## 2019-01-23 DIAGNOSIS — Z803 Family history of malignant neoplasm of breast: Secondary | ICD-10-CM | POA: Diagnosis not present

## 2019-01-23 DIAGNOSIS — R293 Abnormal posture: Secondary | ICD-10-CM | POA: Diagnosis present

## 2019-01-23 DIAGNOSIS — Z8042 Family history of malignant neoplasm of prostate: Secondary | ICD-10-CM

## 2019-01-23 DIAGNOSIS — E7800 Pure hypercholesterolemia, unspecified: Secondary | ICD-10-CM | POA: Insufficient documentation

## 2019-01-23 DIAGNOSIS — Z800 Family history of malignant neoplasm of digestive organs: Secondary | ICD-10-CM | POA: Insufficient documentation

## 2019-01-23 LAB — CMP (CANCER CENTER ONLY)
ALT: 16 U/L (ref 0–44)
AST: 14 U/L — ABNORMAL LOW (ref 15–41)
Albumin: 3.5 g/dL (ref 3.5–5.0)
Alkaline Phosphatase: 73 U/L (ref 38–126)
Anion gap: 10 (ref 5–15)
BUN: 9 mg/dL (ref 6–20)
CO2: 25 mmol/L (ref 22–32)
Calcium: 9.6 mg/dL (ref 8.9–10.3)
Chloride: 105 mmol/L (ref 98–111)
Creatinine: 0.8 mg/dL (ref 0.44–1.00)
GFR, Est AFR Am: >60 mL/min (ref >60)
GFR, Estimated: >60 mL/min (ref >60)
Glucose, Bld: 85 mg/dL (ref 70–99)
Potassium: 3.9 mmol/L (ref 3.5–5.1)
Sodium: 140 mmol/L (ref 135–145)
Total Bilirubin: 0.3 mg/dL (ref 0.3–1.2)
Total Protein: 7.7 g/dL (ref 6.5–8.1)

## 2019-01-23 LAB — CBC WITH DIFFERENTIAL (CANCER CENTER ONLY)
Abs Immature Granulocytes: 0.01 10*3/uL (ref 0.00–0.07)
Basophils Absolute: 0 10*3/uL (ref 0.0–0.1)
Basophils Relative: 0 %
Eosinophils Absolute: 0 10*3/uL (ref 0.0–0.5)
Eosinophils Relative: 1 %
HCT: 40 % (ref 36.0–46.0)
Hemoglobin: 13.2 g/dL (ref 12.0–15.0)
Immature Granulocytes: 0 %
Lymphocytes Relative: 29 %
Lymphs Abs: 1.6 10*3/uL (ref 0.7–4.0)
MCH: 29.9 pg (ref 26.0–34.0)
MCHC: 33 g/dL (ref 30.0–36.0)
MCV: 90.5 fL (ref 80.0–100.0)
Monocytes Absolute: 0.5 10*3/uL (ref 0.1–1.0)
Monocytes Relative: 9 %
Neutro Abs: 3.3 10*3/uL (ref 1.7–7.7)
Neutrophils Relative %: 61 %
Platelet Count: 322 10*3/uL (ref 150–400)
RBC: 4.42 MIL/uL (ref 3.87–5.11)
RDW: 12.2 % (ref 11.5–15.5)
WBC Count: 5.4 10*3/uL (ref 4.0–10.5)
nRBC: 0 % (ref 0.0–0.2)

## 2019-01-23 MED ORDER — DEXAMETHASONE 4 MG PO TABS: 4.0000 mg | ORAL_TABLET | Freq: Every day | ORAL | 0 refills | Status: DC

## 2019-01-23 MED ORDER — LIDOCAINE-PRILOCAINE 2.5-2.5 % EX CREA: TOPICAL_CREAM | CUTANEOUS | 3 refills | Status: DC

## 2019-01-23 MED ORDER — LORAZEPAM 0.5 MG PO TABS: 0.5000 mg | ORAL_TABLET | Freq: Every evening | ORAL | 0 refills | Status: DC | PRN

## 2019-01-23 MED ORDER — ONDANSETRON HCL 8 MG PO TABS: 8.0000 mg | ORAL_TABLET | Freq: Two times a day (BID) | ORAL | 1 refills | Status: DC | PRN

## 2019-01-23 MED ORDER — PROCHLORPERAZINE MALEATE 10 MG PO TABS: 10.0000 mg | ORAL_TABLET | Freq: Four times a day (QID) | ORAL | 1 refills | Status: DC | PRN

## 2019-01-23 NOTE — H&P (View-Only) (Signed)
History of Present Illness Paige Perry. Paige Peavler MD; 01/23/2019 4:56 PM) The patient is a 43 year old female who presents with breast cancer. BREAST [place]  This is a 43 year old female in good health who presents with a recent discovery of a palpable mass in her left breast. This is been present for about a month. She underwent diagnostic mammogram and ultrasound on 01/14/19. Showed a 2.3 cm mass in the left breast at 12:00. She also had a single enlarged left axillary lymph node measuring 3.5 cm. She underwent biopsy of the mass as well as the axillary lymph node. 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 Her father recently passed away of metastatic prostate cancer  Menarche age 25 First pregnancy age 72 Breast-feed no Family history she has a maternal and paternal aunts with breast cancer The patient was on either oral contraceptives or Depo-Provera for over 8 years. She is currently using NuvaRing  PMH: Hypercholesterolemia PSH: Finger surgery  CLINICAL DATA: 43 year old female presenting for evaluation of a palpable lump in the left breast. The patient identified this approximately 2 weeks ago.  EXAM: DIGITAL DIAGNOSTIC BILATERAL MAMMOGRAM WITH CAD AND TOMO  ULTRASOUND LEFT BREAST  COMPARISON: Previous exam(s).  ACR Breast Density Category c: The breast tissue is heterogeneously dense, which may obscure small masses.  FINDINGS: A BB has been placed at the site of palpable concern in the upper-outer left breast. Deep to this marker, there is an irregular mass with spiculated margins measuring approximately 2.0 cm. There is a small asymmetry on the MLO view in the central middle depth of the right breast. This resolves with spot compression tomosynthesis imaging, and no correlate is seen on the CC view. No other suspicious calcifications, masses or  areas of distortion are seen in the bilateral breasts.  Mammographic images were processed with CAD.  On physical exam, there is a firm palpable mass in the superior left breast.  Targeted ultrasound is performed, showing an irregular hypoechoic mass with indistinct margins at 12 o'clock, 8 cm from the nipple. The mass measures 2.3 x 1.5 x 1.4 cm. Blood flow is seen within the mass on color Doppler imaging. Ultrasound of the left axilla demonstrates 1 markedly enlarged lymph node measuring 3.5 x 2.3 x 2.6 cm.  IMPRESSION: 1. There is a highly suspicious mass at the palpable site of concern in the left breast at 12 o'clock.  2. There is 1 abnormal left axillary lymph node.  3. No evidence of malignancy in the right breast.  RECOMMENDATION: Ultrasound guided biopsy is recommended for the palpable left breast mass and left axillary lymph node. The biopsy has been scheduled for today at 1:45 p.m., and will be dictated in a separate report.  I have discussed the findings and recommendations with the patient. If applicable, a reminder letter will be sent to the patient regarding the next appointment.  BI-RADS CATEGORY 5: Highly suggestive of malignancy.   Electronically Signed By: Paige Perry M.D. 01/14/19  CLINICAL DATA: 43 year old female presenting for ultrasound-guided biopsy of a left breast mass and left axillary lymph node.  EXAM: ULTRASOUND GUIDED LEFT BREAST CORE NEEDLE BIOPSY  COMPARISON: Previous exam(s).  FINDINGS: I met with the patient and we discussed the procedure of ultrasound-guided biopsy, including benefits and alternatives. We discussed the high likelihood of a successful procedure. We discussed the risks of the procedure, including  infection, bleeding, tissue injury, clip migration, and inadequate sampling. Informed written consent was given. The usual time-out protocol was performed immediately prior to the procedure.  #1 Lesion  quadrant: Upper-outer quadrant  Using sterile technique and 1% Lidocaine as local anesthetic, under direct ultrasound visualization, a 14 gauge spring-loaded device was used to perform biopsy of a mass in the left breast at 12 o'clock using a lateral approach. At the conclusion of the procedure a ribbon shaped tissue marker clip was deployed into the biopsy cavity.   --------------------------------------------------------------------------------------------------------------------------------------------  #2 Lesion quadrant: Left axilla  Using sterile technique and 1% Lidocaine as local anesthetic, under direct ultrasound visualization, a 14 gauge spring-loaded device was used to perform biopsy of a left axillary lymph node using a lateral approach. At the conclusion of the procedure a Q shaped tissue marker clip was deployed into the biopsy cavity.  Follow up 2 view mammogram was performed and dictated separately.  IMPRESSION: 1. Ultrasound guided biopsy of a left breast mass at 12 o'clock. No apparent complications.  2. Ultrasound guided biopsy of a left axillary lymph node. No apparent complications.  Electronically Signed: By: Paige Perry M.D. On: 01/14/2019 14:29    Diagnostic Studies History Paige Pummel, RN; 01/23/2019 7:22 AM) Colonoscopy never Mammogram within last year Pap Smear 1-5 years ago  Allergies Paige Key K. Tsuei, MD; 01/23/2019 4:56 PM) Paige Perry Oil *CHEMICALS*  Medication History Paige Perry. Tsuei, MD; 01/23/2019 4:56 PM) Medications Reconciled Aspirin (325MG Tablet, Oral) Active. ZyrTEC Allergy (10MG Capsule, Oral) Active. Cholecalciferol (25 MCG(1000 UT) Tablet Disint, Oral) Active. NuvaRing (0.12-0.015MG/24HR Ring, Vaginal) Active. Ibuprofen (400MG Tablet, Oral) Active.  Social History Paige Pummel, RN; 01/23/2019 7:22 AM) Alcohol use Recently quit alcohol use. Caffeine use Carbonated beverages. Illicit drug use  Remotely quit drug use. Tobacco use Never smoker.  Family History Paige Pummel, RN; 01/23/2019 7:22 AM) Arthritis Mother. Breast Cancer Family Members In General. Heart Disease Family Members In General. Migraine Headache Sister.  Pregnancy / Birth History Paige Pummel, RN; 01/23/2019 7:22 AM) Age at menarche 16 years. Contraceptive History Oral contraceptives. Gravida 2 Irregular periods Maternal age 53-20 Para 2  Other Problems Paige Pummel, RN; 01/23/2019 7:22 AM) Arthritis Hypercholesterolemia     Review of Systems Sunday Spillers Ledford RN; 01/23/2019 7:22 AM) General Present- Chills. Not Present- Appetite Loss, Fatigue, Fever, Night Sweats, Weight Gain and Weight Loss. HEENT Present- Seasonal Allergies, Sore Throat and Wears glasses/contact lenses. Not Present- Earache, Hearing Loss, Hoarseness, Nose Bleed, Oral Ulcers, Ringing in the Ears, Sinus Pain, Visual Disturbances and Yellow Eyes. Respiratory Not Present- Bloody sputum, Chronic Cough, Difficulty Breathing, Snoring and Wheezing. Cardiovascular Not Present- Chest Pain, Difficulty Breathing Lying Down, Leg Cramps, Palpitations, Rapid Heart Rate, Shortness of Breath and Swelling of Extremities. Gastrointestinal Present- Excessive gas. Not Present- Abdominal Pain, Bloating, Bloody Stool, Change in Bowel Habits, Chronic diarrhea, Constipation, Difficulty Swallowing, Gets full quickly at meals, Hemorrhoids, Indigestion, Nausea, Rectal Pain and Vomiting. Female Genitourinary Not Present- Frequency, Nocturia, Painful Urination, Pelvic Pain and Urgency. Musculoskeletal Present- Swelling of Extremities. Not Present- Back Pain, Joint Pain, Joint Stiffness, Muscle Pain and Muscle Weakness. Neurological Not Present- Decreased Memory, Fainting, Headaches, Numbness, Seizures, Tingling, Tremor, Trouble walking and Weakness. Psychiatric Not Present- Anxiety, Bipolar, Change in Sleep Pattern, Depression, Fearful and  Frequent crying. Endocrine Not Present- Cold Intolerance, Excessive Hunger, Hair Changes, Heat Intolerance, Hot flashes and New Diabetes. Hematology Not Present- Blood Thinners, Easy Bruising, Excessive bleeding, Gland problems, HIV and Persistent Infections.  Vitals Paige Key K. Tsuei MD; 01/23/2019 4:56 PM)  01/23/2019 4:56 PM Weight: 177 lb Height: 63in Body Surface Area: 1.84 m Body Mass Index: 31.35 kg/m  Temp.: 97.73F  Pulse: 83 (Regular)  Resp.: 18 (Unlabored)  BP: 115/80 (Sitting, Left Arm, Standard)        Physical Exam Paige Key K. Tsuei MD; 01/23/2019 5:00 PM)  The physical exam findings are as follows: Note:WDWN in NAD Eyes: Pupils equal, round; sclera anicteric HENT: Oral mucosa moist; good dentition Neck: No masses palpated, no thyromegaly Lungs: CTA bilaterally; normal respiratory effort Breasts: Symmetric, no nipple retraction or discharge, no palpable masses in the right breast. No right axillary lymphadenopathy. Firm palpable mass in the upper outer quadrant of the left breast at 12:30. The patient has firm palpable lymph nodes in the left axilla. CV: Regular rate and rhythm; no murmurs; extremities well-perfused with no edema Abd: +bowel sounds, soft, non-tender, no palpable organomegaly; no palpable hernias Skin: Warm, dry; no sign of jaundice Psychiatric - alert and oriented x 4; calm mood and affect    Assessment & Plan Paige Key K. 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 The surgical procedure has been discussed with the patient. Potential risks, benefits, alternative treatments, and expected outcomes have been explained. All of the patient's questions at this time have been  answered. The likelihood of reaching the patient's treatment goal is good. The patient understand the proposed surgical procedure and wishes to proceed. Note:I spent some time with the patient and her sister discussing various surgical treatment options. She had already had some discussion with medical oncology. The decision is made to proceed with neoadjuvant chemotherapy as well as genetic counseling and testing. Pending the outcome of her response to chemotherapy as well as genetic testing, we will plan either breast conserving therapy or mastectomy. We will discuss this further in the future. We will proceed with port placement as soon as possible so she may begin her chemotherapy within the next 2 weeks.  Paige Perry. Georgette Dover, MD, [facility]  General/ Trauma Surgery   01/23/2019 5:05 PM

## 2019-01-23 NOTE — H&P (Signed)
History of Present Illness Paige Perry. Tsuei MD; 01/23/2019 4:56 PM) The patient is a 43 year old female who presents with breast cancer. BREAST [facility]  This is a 43 year old female in good health who presents with a recent discovery of a palpable mass in her left breast. This is been present for about a month. She underwent diagnostic mammogram and ultrasound on 01/14/19. Showed a 2.3 cm mass in the left breast at 12:00. She also had a single enlarged left axillary lymph node measuring 3.5 cm. She underwent biopsy of the mass as well as the axillary lymph node. 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 Her father recently passed away of metastatic prostate cancer  Menarche age 25 First pregnancy age 72 Breast-feed no Family history she has a maternal and paternal aunts with breast cancer The patient was on either oral contraceptives or Depo-Provera for over 8 years. She is currently using NuvaRing  PMH: Hypercholesterolemia PSH: Finger surgery  CLINICAL DATA: 43 year old female presenting for evaluation of a palpable lump in the left breast. The patient identified this approximately 2 weeks ago.  EXAM: DIGITAL DIAGNOSTIC BILATERAL MAMMOGRAM WITH CAD AND TOMO  ULTRASOUND LEFT BREAST  COMPARISON: Previous exam(s).  ACR Breast Density Category c: The breast tissue is heterogeneously dense, which may obscure small masses.  FINDINGS: A BB has been placed at the site of palpable concern in the upper-outer left breast. Deep to this marker, there is an irregular mass with spiculated margins measuring approximately 2.0 cm. There is a small asymmetry on the MLO view in the central middle depth of the right breast. This resolves with spot compression tomosynthesis imaging, and no correlate is seen on the CC view. No other suspicious calcifications, masses or  areas of distortion are seen in the bilateral breasts.  Mammographic images were processed with CAD.  On physical exam, there is a firm palpable mass in the superior left breast.  Targeted ultrasound is performed, showing an irregular hypoechoic mass with indistinct margins at 12 o'clock, 8 cm from the nipple. The mass measures 2.3 x 1.5 x 1.4 cm. Blood flow is seen within the mass on color Doppler imaging. Ultrasound of the left axilla demonstrates 1 markedly enlarged lymph node measuring 3.5 x 2.3 x 2.6 cm.  IMPRESSION: 1. There is a highly suspicious mass at the palpable site of concern in the left breast at 12 o'clock.  2. There is 1 abnormal left axillary lymph node.  3. No evidence of malignancy in the right breast.  RECOMMENDATION: Ultrasound guided biopsy is recommended for the palpable left breast mass and left axillary lymph node. The biopsy has been scheduled for today at 1:45 p.m., and will be dictated in a separate report.  I have discussed the findings and recommendations with the patient. If applicable, a reminder letter will be sent to the patient regarding the next appointment.  BI-RADS CATEGORY 5: Highly suggestive of malignancy.   Electronically Signed By: Paige Perry M.D. 01/14/19  CLINICAL DATA: 43 year old female presenting for ultrasound-guided biopsy of a left breast mass and left axillary lymph node.  EXAM: ULTRASOUND GUIDED LEFT BREAST CORE NEEDLE BIOPSY  COMPARISON: Previous exam(s).  FINDINGS: I met with the patient and we discussed the procedure of ultrasound-guided biopsy, including benefits and alternatives. We discussed the high likelihood of a successful procedure. We discussed the risks of the procedure, including  infection, bleeding, tissue injury, clip migration, and inadequate sampling. Informed written consent was given. The usual time-out protocol was performed immediately prior to the procedure.  #1 Lesion  quadrant: Upper-outer quadrant  Using sterile technique and 1% Lidocaine as local anesthetic, under direct ultrasound visualization, a 14 gauge spring-loaded device was used to perform biopsy of a mass in the left breast at 12 o'clock using a lateral approach. At the conclusion of the procedure a ribbon shaped tissue marker clip was deployed into the biopsy cavity.   --------------------------------------------------------------------------------------------------------------------------------------------  #2 Lesion quadrant: Left axilla  Using sterile technique and 1% Lidocaine as local anesthetic, under direct ultrasound visualization, a 14 gauge spring-loaded device was used to perform biopsy of a left axillary lymph node using a lateral approach. At the conclusion of the procedure a Q shaped tissue marker clip was deployed into the biopsy cavity.  Follow up 2 view mammogram was performed and dictated separately.  IMPRESSION: 1. Ultrasound guided biopsy of a left breast mass at 12 o'clock. No apparent complications.  2. Ultrasound guided biopsy of a left axillary lymph node. No apparent complications.  Electronically Signed: By: Paige Perry M.D. On: 01/14/2019 14:29    Diagnostic Studies History Paige Pummel, RN; 01/23/2019 7:22 AM) Colonoscopy never Mammogram within last year Pap Smear 1-5 years ago  Allergies Paige Key K. Yeshua Stryker, MD; 01/23/2019 4:56 PM) Paige Perry *CHEMICALS*  Medication History Paige Perry. Paige Halseth, MD; 01/23/2019 4:56 PM) Medications Reconciled Aspirin (325MG Tablet, Oral) Active. ZyrTEC Allergy (10MG Capsule, Oral) Active. Cholecalciferol (25 MCG(1000 UT) Tablet Disint, Oral) Active. NuvaRing (0.12-0.015MG/24HR Ring, Vaginal) Active. Ibuprofen (400MG Tablet, Oral) Active.  Social History Paige Pummel, RN; 01/23/2019 7:22 AM) Alcohol use Recently quit alcohol use. Caffeine use Carbonated beverages. Illicit drug use  Remotely quit drug use. Tobacco use Never smoker.  Family History Paige Pummel, RN; 01/23/2019 7:22 AM) Arthritis Mother. Breast Cancer Family Members In General. Heart Disease Family Members In General. Migraine Headache Sister.  Pregnancy / Birth History Paige Pummel, RN; 01/23/2019 7:22 AM) Age at menarche 72 years. Contraceptive History Oral contraceptives. Gravida 2 Irregular periods Maternal age 79-20 Para 2  Other Problems Paige Pummel, RN; 01/23/2019 7:22 AM) Arthritis Hypercholesterolemia     Review of Systems Paige Spillers Ledford RN; 01/23/2019 7:22 AM) General Present- Chills. Not Present- Appetite Loss, Fatigue, Fever, Night Sweats, Weight Gain and Weight Loss. HEENT Present- Seasonal Allergies, Sore Throat and Wears glasses/contact lenses. Not Present- Earache, Hearing Loss, Hoarseness, Nose Bleed, Oral Ulcers, Ringing in the Ears, Sinus Pain, Visual Disturbances and Yellow Eyes. Respiratory Not Present- Bloody sputum, Chronic Cough, Difficulty Breathing, Snoring and Wheezing. Cardiovascular Not Present- Chest Pain, Difficulty Breathing Lying Down, Leg Cramps, Palpitations, Rapid Heart Rate, Shortness of Breath and Swelling of Extremities. Gastrointestinal Present- Excessive gas. Not Present- Abdominal Pain, Bloating, Bloody Stool, Change in Bowel Habits, Chronic diarrhea, Constipation, Difficulty Swallowing, Gets full quickly at meals, Hemorrhoids, Indigestion, Nausea, Rectal Pain and Vomiting. Female Genitourinary Not Present- Frequency, Nocturia, Painful Urination, Pelvic Pain and Urgency. Musculoskeletal Present- Swelling of Extremities. Not Present- Back Pain, Joint Pain, Joint Stiffness, Muscle Pain and Muscle Weakness. Neurological Not Present- Decreased Memory, Fainting, Headaches, Numbness, Seizures, Tingling, Tremor, Trouble walking and Weakness. Psychiatric Not Present- Anxiety, Bipolar, Change in Sleep Pattern, Depression, Fearful and  Frequent crying. Endocrine Not Present- Cold Intolerance, Excessive Hunger, Hair Changes, Heat Intolerance, Hot flashes and New Diabetes. Hematology Not Present- Blood Thinners, Easy Bruising, Excessive bleeding, Gland problems, HIV and Persistent Infections.  Vitals Paige Key K. Gagandeep Pettet MD; 01/23/2019 4:56 PM)  01/23/2019 4:56 PM Weight: 177 lb Height: 63in Body Surface Area: 1.84 m Body Mass Index: 31.35 kg/m  Temp.: 97.73F  Pulse: 83 (Regular)  Resp.: 18 (Unlabored)  BP: 115/80 (Sitting, Left Arm, Standard)        Physical Exam Paige Key K. Arali Somera MD; 01/23/2019 5:00 PM)  The physical exam findings are as follows: Note:WDWN in NAD Eyes: Pupils equal, round; sclera anicteric HENT: Oral mucosa moist; good dentition Neck: No masses palpated, no thyromegaly Lungs: CTA bilaterally; normal respiratory effort Breasts: Symmetric, no nipple retraction or discharge, no palpable masses in the right breast. No right axillary lymphadenopathy. Firm palpable mass in the upper outer quadrant of the left breast at 12:30. The patient has firm palpable lymph nodes in the left axilla. CV: Regular rate and rhythm; no murmurs; extremities well-perfused with no edema Abd: +bowel sounds, soft, non-tender, no palpable organomegaly; no palpable hernias Skin: Warm, dry; no sign of jaundice Psychiatric - alert and oriented x 4; calm mood and affect    Assessment & Plan Paige Key K. 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 The surgical procedure has been discussed with the patient. Potential risks, benefits, alternative treatments, and expected outcomes have been explained. All of the patient's questions at this time have been  answered. The likelihood of reaching the patient's treatment goal is good. The patient understand the proposed surgical procedure and wishes to proceed. Note:I spent some time with the patient and her sister discussing various surgical treatment options. She had already had some discussion with medical oncology. The decision is made to proceed with neoadjuvant chemotherapy as well as genetic counseling and testing. Pending the outcome of her response to chemotherapy as well as genetic testing, we will plan either breast conserving therapy or mastectomy. We will discuss this further in the future. We will proceed with port placement as soon as possible so she may begin her chemotherapy within the next 2 weeks.  Paige Perry. Georgette Dover, MD, [facility]  General/ Trauma Surgery   01/23/2019 5:05 PM

## 2019-01-23 NOTE — Research (Signed)
01/23/2019 Research - SWOG L3810 screening Met with patient in clinic today after receiving referral from Dr. Lindi Adie for the [facility] [id number] Research study "PROSPECTIVE OBSERVATIONAL COHORT STUDY TO DEVELOP A PREDICTIVE MODEL OF TAXANE-INDUCED PERIPHERAL NEUROPATHY IN CANCER PATIENTS". Briefly described the study plan including assessments at baseline and throughout the course of her treatment and during follow-up. This will include completion of questionnaires, blood sample collection and assessments of nerve function. Patient seemed interested in participating but voiced concern that she did not want needle sticks. Patient was given copies of the consent and authorization forms, along with the [place] brochure and my business card, so that she can review study information further to make a decision regarding this voluntary trial. Explained to patient that I would follow-up with her at a later time once her schedule has become established. Thanked patient for her willingness to consider participating. Cindy S. Brigitte Pulse BSN, RN, [place] 01/23/2019 4:58 PM

## 2019-01-23 NOTE — Progress Notes (Signed)
Radiation Oncology         [phone] ________________________________  Name: Paige Perry        MRN: [id number]  Date of Service: 01/23/2019 DOB: 08-Dec-1975  AS:[id number], Alanda Slim, MD  Donnie Mesa, MD     REFERRING PHYSICIAN: Donnie Mesa, MD   DIAGNOSIS: The encounter diagnosis was Malignant neoplasm of upper-outer quadrant of left breast in female, estrogen receptor positive ([place]).   HISTORY OF PRESENT ILLNESS: Paige Perry is a 43 y.o. female seen in the multidisciplinary breast clinic for a new diagnosis of left breast cancer. The patient was noted to have a palpable mass in the left breast.  She sought further evaluation and diagnostic imaging revealed a mass in the left breast at 12:00.  Further diagnostic ultrasound measured this area at 2.3 x 1.5 x 1.4 cm.  Her left axilla revealed 1 markedly enlarged lymph node measuring 3.5 x 2.3 x 2.6 cm.  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  She is seen today to discuss options of treatment for her cancer.    PREVIOUS RADIATION THERAPY: No   PAST MEDICAL HISTORY:  Past Medical History:  Diagnosis Date   Hypercholesteremia        PAST SURGICAL HISTORY: Past Surgical History:  Procedure Laterality Date   FINGER SURGERY       FAMILY HISTORY:  Family History  Problem Relation Age of Onset   Hypertension Mother    Heart failure Mother    Breast cancer Maternal Aunt    Breast cancer Paternal Aunt      SOCIAL HISTORY:  reports that she has never smoked. She has never used smokeless tobacco. She reports current alcohol use. She reports that she does not use drugs.  The patient is single and lives in [place].  She works at [employer] and has a 44 year old and 44 year old.   ALLERGIES: Other   MEDICATIONS:  Current Outpatient Medications   Medication Sig Dispense Refill   cetirizine (ZYRTEC) 10 MG tablet Take 10 mg by mouth as needed.      Etonogestrel-Ethinyl Estradiol (NUVARING VA) Place vaginally. Stays in for three weeks then take it out for one week.     ibuprofen (ADVIL,MOTRIN) 400 MG tablet Take 1 tablet (400 mg total) by mouth every 6 (six) hours as needed for pain. 30 tablet 0   nabumetone (RELAFEN) 500 MG tablet Take 1,000 mg by mouth 2 (two) times daily.     rosuvastatin (CRESTOR) 20 MG tablet Take 20 mg by mouth daily.     No current facility-administered medications for this encounter.      REVIEW OF SYSTEMS: On review of systems, the patient reports that she is doing well overall. She denies any chest pain, shortness of breath, cough, fevers, chills, night sweats, unintended weight changes. She denies any bowel or bladder disturbances, and denies abdominal pain, nausea or vomiting. She denies any new musculoskeletal or joint aches or pains. A complete review of systems is obtained and is otherwise negative.     PHYSICAL EXAM:  Wt Readings from Last 3 Encounters:  01/23/19 177 lb (80.3 kg)   Temp Readings from Last 3 Encounters:  01/23/19 97.8 F (36.6 C) (Temporal)  01/09/12 98.3 F (36.8 C) (Oral)   BP Readings from Last 3 Encounters:  01/23/19 115/80  01/01/15 98/60  01/09/12 111/68   Pulse Readings from Last 3 Encounters:  01/23/19 83  01/01/15 72  01/09/12 (!) 58    In general this is a well appearing African American female in no acute distress. She's alert and oriented x4 and appropriate throughout the examination. Cardiopulmonary assessment is negative for acute distress and she exhibits normal effort.  Bilateral breast exam is deferred.    ECOG = 0  0 - Asymptomatic (Fully active, able to carry on all predisease activities without restriction)  1 - Symptomatic but completely ambulatory (Restricted in physically strenuous activity but ambulatory and able to carry out work of a  light or sedentary nature. For example, light housework, office work)  2 - Symptomatic, <50% in bed during the day (Ambulatory and capable of all self care but unable to carry out any work activities. Up and about more than 50% of waking hours)  3 - Symptomatic, >50% in bed, but not bedbound (Capable of only limited self-care, confined to bed or chair 50% or more of waking hours)  4 - Bedbound (Completely disabled. Cannot carry on any self-care. Totally confined to bed or chair)  5 - Death   Eustace Pen MM, Creech RH, Tormey DC, et al. [phone]). "Toxicity and response criteria of the [facility]". [place] Oncol. 5 (6): 649-55    LABORATORY DATA:  Lab Results  Component Value Date   WBC 5.4 01/23/2019   HGB 13.2 01/23/2019   HCT 40.0 01/23/2019   MCV 90.5 01/23/2019   PLT 322 01/23/2019   Lab Results  Component Value Date   NA 140 01/23/2019   K 3.9 01/23/2019   CL 105 01/23/2019   CO2 25 01/23/2019   Lab Results  Component Value Date   ALT 16 01/23/2019   AST 14 (L) 01/23/2019   ALKPHOS 73 01/23/2019   BILITOT 0.3 01/23/2019      RADIOGRAPHY: US Breast Ltd Uni Left Inc Axilla  Result Date: 01/14/2019 CLINICAL DATA:  43 year old female presenting for evaluation of a palpable lump in the left breast. The patient identified this approximately 2 weeks ago. EXAM: DIGITAL DIAGNOSTIC BILATERAL MAMMOGRAM WITH CAD AND TOMO ULTRASOUND LEFT BREAST COMPARISON:  Previous exam(s). ACR Breast Density Category c: The breast tissue is heterogeneously dense, which may obscure small masses. FINDINGS: A BB has been placed at the site of palpable concern in the upper-outer left breast. Deep to this marker, there is an irregular mass with spiculated margins measuring approximately 2.0 cm. There is a small asymmetry on the MLO view in the central middle depth of the right breast. This resolves with spot compression tomosynthesis imaging, and no correlate is seen on the CC view.  No other suspicious calcifications, masses or areas of distortion are seen in the bilateral breasts. Mammographic images were processed with CAD. On physical exam, there is a firm palpable mass in the superior left breast. Targeted ultrasound is performed, showing an irregular hypoechoic mass with indistinct margins at 12 o'clock, 8 cm from the nipple. The mass measures 2.3 x 1.5 x 1.4 cm. Blood flow is seen within the mass on color Doppler imaging. Ultrasound of the left axilla demonstrates 1 markedly enlarged lymph node measuring 3.5 x 2.3 x 2.6 cm. IMPRESSION: 1. There is a highly suspicious mass at the palpable site of concern in the left breast at 12 o'clock. 2.  There is 1 abnormal left axillary lymph node. 3.  No evidence of malignancy in the right breast.  RECOMMENDATION: Ultrasound guided biopsy is recommended for the palpable left breast mass and left axillary lymph node. The biopsy has been scheduled for today at 1:45 p.m., and will be dictated in a separate report. I have discussed the findings and recommendations with the patient. If applicable, a reminder letter will be sent to the patient regarding the next appointment. BI-RADS CATEGORY  5: Highly suggestive of malignancy. Electronically Signed   By: Ammie Ferrier M.D.   On: 01/14/2019 09:59   Mm Diag Breast Tomo Bilateral  Result Date: 01/14/2019 CLINICAL DATA:  43 year old female presenting for evaluation of a palpable lump in the left breast. The patient identified this approximately 2 weeks ago. EXAM: DIGITAL DIAGNOSTIC BILATERAL MAMMOGRAM WITH CAD AND TOMO ULTRASOUND LEFT BREAST COMPARISON:  Previous exam(s). ACR Breast Density Category c: The breast tissue is heterogeneously dense, which may obscure small masses. FINDINGS: A BB has been placed at the site of palpable concern in the upper-outer left breast. Deep to this marker, there is an irregular mass with spiculated margins measuring approximately 2.0 cm. There is a small asymmetry on  the MLO view in the central middle depth of the right breast. This resolves with spot compression tomosynthesis imaging, and no correlate is seen on the CC view. No other suspicious calcifications, masses or areas of distortion are seen in the bilateral breasts. Mammographic images were processed with CAD. On physical exam, there is a firm palpable mass in the superior left breast. Targeted ultrasound is performed, showing an irregular hypoechoic mass with indistinct margins at 12 o'clock, 8 cm from the nipple. The mass measures 2.3 x 1.5 x 1.4 cm. Blood flow is seen within the mass on color Doppler imaging. Ultrasound of the left axilla demonstrates 1 markedly enlarged lymph node measuring 3.5 x 2.3 x 2.6 cm. IMPRESSION: 1. There is a highly suspicious mass at the palpable site of concern in the left breast at 12 o'clock. 2.  There is 1 abnormal left axillary lymph node. 3.  No evidence of malignancy in the right breast. RECOMMENDATION: Ultrasound guided biopsy is recommended for the palpable left breast mass and left axillary lymph node. The biopsy has been scheduled for today at 1:45 p.m., and will be dictated in a separate report. I have discussed the findings and recommendations with the patient. If applicable, a reminder letter will be sent to the patient regarding the next appointment. BI-RADS CATEGORY  5: Highly suggestive of malignancy. Electronically Signed   By: Ammie Ferrier M.D.   On: 01/14/2019 09:59   [place] Axillary Node Core Biopsy Left  Addendum Date: 01/15/2019   ADDENDUM REPORT: 01/15/2019 14:33 ADDENDUM: Pathology revealed GRADE III INVASIVE DUCTAL CARCINOMA of the Left breast, 12 o'clock. This was found to be concordant by Dr. Ammie Ferrier. 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 This was found to be concordant by Dr. Ammie Ferrier. Pathology results were discussed with the patient by telephone. The patient reported doing well after the biopsies with  tenderness at the sites. Post biopsy instructions and care were reviewed and questions were answered. The patient was encouraged to call [facility] for any additional concerns. The patient was referred to [facility] on January 23, 2019. Pathology results reported by Terie Purser, RN on 01/15/2019. Electronically Signed   By: Ammie Ferrier M.D.   On: 01/15/2019 14:33   Result Date: 01/15/2019 CLINICAL DATA:  43 year old female presenting for  ultrasound-guided biopsy of a left breast mass and left axillary lymph node. EXAM: ULTRASOUND GUIDED LEFT BREAST CORE NEEDLE BIOPSY COMPARISON:  Previous exam(s). FINDINGS: I met with the patient and we discussed the procedure of ultrasound-guided biopsy, including benefits and alternatives. We discussed the high likelihood of a successful procedure. We discussed the risks of the procedure, including infection, bleeding, tissue injury, clip migration, and inadequate sampling. Informed written consent was given. The usual time-out protocol was performed immediately prior to the procedure. #1 Lesion quadrant: Upper-outer quadrant Using sterile technique and 1% Lidocaine as local anesthetic, under direct ultrasound visualization, a 14 gauge spring-loaded device was used to perform biopsy of a mass in the left breast at 12 o'clock using a lateral approach. At the conclusion of the procedure a ribbon shaped tissue marker clip was deployed into the biopsy cavity. -------------------------------------------------------------------------------------------------------------------------------------------- #2 Lesion quadrant: Left axilla Using sterile technique and 1% Lidocaine as local anesthetic, under direct ultrasound visualization, a 14 gauge spring-loaded device was used to perform biopsy of a left axillary lymph node using a lateral approach. At the conclusion of the  procedure a Q shaped tissue marker clip was deployed into the biopsy cavity. Follow up 2 view mammogram was performed and dictated separately. IMPRESSION: 1. Ultrasound guided biopsy of a left breast mass at 12 o'clock. No apparent complications. 2. Ultrasound guided biopsy of a left axillary lymph node. No apparent complications. Electronically Signed: By: Ammie Ferrier M.D. On: 01/14/2019 14:29   Mm Clip Placement Left  Result Date: 01/14/2019 CLINICAL DATA:  Post biopsy mammogram of the left breast clip placement. EXAM: DIAGNOSTIC LEFT MAMMOGRAM POST ULTRASOUND BIOPSY COMPARISON:  Previous exam(s). FINDINGS: Mammographic images were obtained following ultrasound guided biopsy of a left breast mass at 12 o'clock and a left axillary lymph node. The ribbon shaped biopsy marking clip is in expected position at the site of biopsy in the superior left breast. The Q shaped biopsy marking clip is seen on the mL image within the left axillary lymph node. IMPRESSION: Appropriate positioning of the ribbon shaped shaped biopsy marking clip at the site of biopsy in the right breast at 12 o'clock and of the Q shaped biopsy marking clip in the left axillary lymph node. Final Assessment: Post Procedure Mammograms for Marker Placement Electronically Signed   By: Ammie Ferrier M.D.   On: 01/14/2019 14:40   [place] Lt Breast Bx W Loc Dev 1st Lesion Img Bx Spec US Guide  Addendum Date: 01/15/2019   ADDENDUM REPORT: 01/15/2019 14:33 ADDENDUM: Pathology revealed GRADE III INVASIVE DUCTAL CARCINOMA of the Left breast, 12 o'clock. This was found to be concordant by Dr. Ammie Ferrier. 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 This was found to be concordant by Dr. Ammie Ferrier. Pathology results were discussed with the patient by telephone. The patient reported doing well after the biopsies with tenderness at the sites. Post biopsy instructions and care were reviewed and questions were  answered. The patient was encouraged to call [facility] for any additional concerns. The patient was [facility] on January 23, 2019. Pathology results reported by Terie Purser, RN on 01/15/2019. Electronically Signed   By: Ammie Ferrier M.D.   On: 01/15/2019 14:33   Result Date: 01/15/2019 CLINICAL DATA:  43 year old female presenting for ultrasound-guided biopsy of a left breast mass and left axillary lymph node. EXAM: ULTRASOUND GUIDED LEFT BREAST CORE NEEDLE BIOPSY COMPARISON:  Previous exam(s). FINDINGS: I met with the patient and we discussed the procedure of ultrasound-guided biopsy, including benefits and alternatives. We discussed the high likelihood of a successful procedure. We discussed the risks of the procedure, including infection, bleeding, tissue injury, clip migration, and inadequate sampling. Informed written consent was given. The usual time-out protocol was performed immediately prior to the procedure. #1 Lesion quadrant: Upper-outer quadrant Using sterile technique and 1% Lidocaine as local anesthetic, under direct ultrasound visualization, a 14 gauge spring-loaded device was used to perform biopsy of a mass in the left breast at 12 o'clock using a lateral approach. At the conclusion of the procedure a ribbon shaped tissue marker clip was deployed into the biopsy cavity. -------------------------------------------------------------------------------------------------------------------------------------------- #2 Lesion quadrant: Left axilla Using sterile technique and 1% Lidocaine as local anesthetic, under direct ultrasound visualization, a 14 gauge spring-loaded device was used to perform biopsy of a left axillary lymph node using a lateral approach. At the conclusion of the procedure a Q shaped tissue marker clip was deployed into the biopsy cavity. Follow up 2 view  mammogram was performed and dictated separately. IMPRESSION: 1. Ultrasound guided biopsy of a left breast mass at 12 o'clock. No apparent complications. 2. Ultrasound guided biopsy of a left axillary lymph node. No apparent complications. Electronically Signed: By: Ammie Ferrier M.D. On: 01/14/2019 14:29       IMPRESSION/PLAN: 1. 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 Dr. Lisbeth Renshaw discusses the pathology findings and reviews the nature of left HER-2 amplified breast disease. The consensus from the breast conference includes proceeding with genetics, and surgery to be determined.  She would benefit from chemotherapy neoadjuvant setting. She would benefit from postsurgical adjuvant radiotherapy to either the breast or chest wall depending on her surgical approach and regional lymph nodes over 6 1/2 weeks to reduce the risk of local recurrence. We discussed the risks, benefits, short, and long term effects of radiotherapy, and the patient is interested in proceeding at the appropriate interval. We would also plan her treatment with deep inspiration breath hold technique. We will see her back following her surgical and systemic treatment and would anticipate beginning approximately 4 weeks after the last intervention. 2. Possible genetic predisposition to malignancy. The patient is a candidate for genetic testing given her personal and family history. She was offered referral and is interested in meeting with them. 3. Contraceptive counseling.  The patient is currently using NuvaRing for contraception.  She is counseled that pregnancy would need to be avoided in the midst of radiotherapy.  She will discuss further with Dr. Lindi Adie regarding the hormonal aspect of her current regimen.  We will proceed with urine pregnancy testing prior to treatment unless she were to have surgical sterilization prior.  In a visit lasting 30 minutes, greater than 50%  of the time was spent face to face discussing her case, and coordinating the patient's care.  The above documentation reflects my direct findings during this shared patient visit. Please see the separate note by Dr. Lisbeth Renshaw on this date for the remainder of the patient's plan of care.    Carola Rhine, PAC

## 2019-01-23 NOTE — Research (Signed)
01/23/2019 Research - DCP-001 Following discussion with patient regarding the SWOG S1714 research study, discussed with patient possible participation in the DCP-001 "Use of a Clinical Trial Screening Tool to Address Cancer Health Disparities in the [place] Program" project. Explained to patient that this involves one-time consent, and collection of demographic and clinical variables, with the majority of data being collected from her medical record. Patient is aware that she qualifies for the DCP-001 study regardless of her decision to enroll in or eligibility for the S1714 study. Plans made to follow up with the patient at a subsequent clinic visit to provide a copy of the consent and authorization forms. Thanked patient for her willingness to consider participation in this study. Cindy S. Brigitte Pulse BSN, RN, [place] 01/23/2019 5:03 PM

## 2019-01-23 NOTE — Progress Notes (Signed)
START ON PATHWAY REGIMEN - Breast     A cycle is every 21 days:     Pertuzumab      Pertuzumab      Trastuzumab-xxxx      Trastuzumab-xxxx      Carboplatin      Docetaxel   **Always confirm dose/schedule in your pharmacy ordering system**  Patient Characteristics: Preoperative or Nonsurgical Candidate (Clinical Staging), Neoadjuvant Therapy followed by Surgery, Invasive Disease, Chemotherapy, HER2 Positive, ER Positive Therapeutic Status: Preoperative or Nonsurgical Candidate (Clinical Staging) AJCC M Category: cM0 AJCC Grade: G2 Breast Surgical Plan: Neoadjuvant Therapy followed by Surgery ER Status: Positive (+) AJCC 8 Stage Grouping: IIA HER2 Status: Positive (+) AJCC T Category: cT2 AJCC N Category: cN1 PR Status: Negative (-) Intent of Therapy: Curative Intent, Discussed with Patient 

## 2019-01-23 NOTE — Therapy (Signed)
[place], [place], [address] Phone: [phone]   Fax:  [phone]  Physical Therapy Evaluation  Patient Details  Name: Paige Perry MRN: [id number] Date of Birth: [birth year] Referring Provider (PT): Dr. Donnie Mesa   Encounter Date: 01/23/2019  PT End of Session - 01/23/19 1518    Visit Number  1    Number of Visits  1    PT Start Time  1402    PT Stop Time  1428    PT Time Calculation (min)  26 min    Activity Tolerance  Patient tolerated treatment well    Behavior During Therapy  [facility] for tasks assessed/performed       Past Medical History:  Diagnosis Date  . Hypercholesteremia     Past Surgical History:  Procedure Laterality Date  . FINGER SURGERY      There were no vitals filed for this visit.   Subjective Assessment - 01/23/19 1501    Subjective  Patient reports she is here today to be seen by her medical team for her newly diagnosed left breast cancer.    Pertinent History  Patient was diagnosed on 01/14/2019 with left grade III invasive ductal carcinoma breast cancer. It measures 2.3 cm and is located in the upper outer quadrant. It is ER positive, PR negative, and HER2 postive with a Ki67 of 80%. She has a known positive axillary lymph node.    Patient Stated Goals  Reduce lymphedema risk and learn post op shoulder ROM HEP    Currently in Pain?  Yes    Pain Score  4     Pain Location  Shoulder   Upper trap   Pain Orientation  Left    Pain Descriptors / Indicators  Aching;Sharp    Pain Type  Acute pain    Pain Onset  1 to 4 weeks ago    Pain Frequency  Intermittent    Aggravating Factors   Cold weather; pain onset began while getting breast biopsy    Pain Relieving Factors  Unknown    Multiple Pain Sites  No         [facility] PT Assessment - 01/23/19 0001      Assessment   Medical Diagnosis  Left breast cancer    Referring Provider (PT)  Dr. Donnie Mesa    Onset  Date/Surgical Date  01/14/19    Hand Dominance  Left    Prior Therapy  none      Precautions   Precautions  Other (comment)    Precaution Comments  active cancer      Restrictions   Weight Bearing Restrictions  No      Balance Screen   Has the patient fallen in the past 6 months  No    Has the patient had a decrease in activity level because of a fear of falling?   No    Is the patient reluctant to leave their home because of a fear of falling?   No      Home Environment   Living Environment  Private residence    Living Arrangements  Children   Lives with 44 y.o. son   Available Help at Discharge  Family   Sister is moving in     Prior Function   Level of Independence  Independent    Vocation  Full time employment    Civil engineer, contracting at [employer]  She does  not exercise      Cognition   Overall Cognitive Status  Within Functional Limits for tasks assessed      Posture/Postural Control   Posture/Postural Control  Postural limitations    Postural Limitations  Rounded Shoulders;Forward head      ROM / Strength   AROM / PROM / Strength  AROM;Strength      AROM   Overall AROM Comments  Cervical AROM is WNL    AROM Assessment Site  Shoulder    Right/Left Shoulder  Right;Left    Right Shoulder Extension  40 Degrees    Right Shoulder Flexion  155 Degrees    Right Shoulder ABduction  163 Degrees    Right Shoulder Internal Rotation  64 Degrees    Right Shoulder External Rotation  80 Degrees    Left Shoulder Extension  44 Degrees    Left Shoulder Flexion  151 Degrees    Left Shoulder ABduction  162 Degrees    Left Shoulder Internal Rotation  69 Degrees    Left Shoulder External Rotation  90 Degrees      Strength   Overall Strength  Within functional limits for tasks performed        LYMPHEDEMA/ONCOLOGY QUESTIONNAIRE - 01/23/19 1516      Type   Cancer Type  Left breast cancer      Lymphedema Assessments   Lymphedema Assessments  Upper  extremities      Right Upper Extremity Lymphedema   10 cm Proximal to Olecranon Process  31 cm    Olecranon Process  25.3 cm    10 cm Proximal to Ulnar Styloid Process  23 cm    Just Proximal to Ulnar Styloid Process  16.1 cm    Across Hand at [employer]  19.5 cm    At [place] of 2nd Digit  5.9 cm      Left Upper Extremity Lymphedema   10 cm Proximal to Olecranon Process  31.6 cm    Olecranon Process  25.8 cm    10 cm Proximal to Ulnar Styloid Process  22.8 cm    Just Proximal to Ulnar Styloid Process  15.6 cm    Across Hand at [employer]  19.9 cm    At [place] of 2nd Digit  5.9 cm          Quick Dash - 01/23/19 0001    Open a tight or new jar  No difficulty    Do heavy household chores (wash walls, wash floors)  No difficulty    Carry a shopping bag or briefcase  No difficulty    Wash your back  No difficulty    Use a knife to cut food  No difficulty    Recreational activities in which you take some force or impact through your arm, shoulder, or hand (golf, hammering, tennis)  No difficulty    During the past week, to what extent has your arm, shoulder or hand problem interfered with your normal social activities with family, friends, neighbors, or groups?  Not at all    During the past week, to what extent has your arm, shoulder or hand problem limited your work or other regular daily activities  Not at all    Arm, shoulder, or hand pain.  Mild    Tingling (pins and needles) in your arm, shoulder, or hand  None    Difficulty Sleeping  No difficulty    DASH Score  2.27 %  Objective measurements completed on examination: See above findings.      Patient was instructed today in a home exercise program today for post op shoulder range of motion. These included active assist shoulder flexion in sitting, scapular retraction, wall walking with shoulder abduction, and hands behind head external rotation.  She was encouraged to do these twice a day, holding 3 seconds  and repeating 5 times when permitted by her physician.            PT Education - 01/23/19 1517    Education Details  Lymphedema risk reduction and post op shoulder ROM HEP    Person(s) Educated  Patient    Methods  Explanation;Demonstration;Handout    Comprehension  Returned demonstration;Verbalized understanding           Breast Clinic Goals - 01/23/19 1521      Patient will be able to verbalize understanding of pertinent lymphedema risk reduction practices relevant to her diagnosis specifically related to skin care.   Time  1    Period  Days    Status  Achieved      Patient will be able to return demonstrate and/or verbalize understanding of the post-op home exercise program related to regaining shoulder range of motion.   Time  1    Period  Days    Status  Achieved      Patient will be able to verbalize understanding of the importance of attending the postoperative After Breast Cancer Class for further lymphedema risk reduction education and therapeutic exercise.   Time  1    Period  Days    Status  Achieved            Plan - 01/23/19 1519    Clinical Impression Statement  Patient was diagnosed on 01/14/2019 with left grade III invasive ductal carcinoma breast cancer. It measures 2.3 cm and is located in the upper outer quadrant. It is ER positive, PR negative, and HER2 postive with a Ki67 of 80%. She has a known positive axillary lymph node. Her multidisciplinary medical team met prior ot hre assessments to determine a recommended treatment plan. She is planning to have neoadjuvant chemotherapy followed by a left lumpectomy and targeted axillary lymph node dissection, radiation, and anti-estrogen therapy. She will benefit from a follow up post op PT reassessment to determine needs.    Stability/Clinical Decision Making  Stable/Uncomplicated    Clinical Decision Making  Low    Rehab Potential  Excellent    PT Frequency  One time visit    PT  Treatment/Interventions  ADLs/Self Care Home Management;Therapeutic exercise;Patient/family education    PT Next Visit Plan  Will reassess post op if MD refers    PT Home Exercise Plan  Post op shoulder ROM HEP    Consulted and Agree with Plan of Care  Patient;Family member/caregiver    Family Member Consulted  sister       Patient will benefit from skilled therapeutic intervention in order to improve the following deficits and impairments:  Postural dysfunction, Decreased range of motion, Decreased knowledge of precautions, Pain, Impaired UE functional use  Visit Diagnosis: Malignant neoplasm of upper-outer quadrant of left breast in female, estrogen receptor positive ([place]) - Plan: PT plan of care cert/re-cert  Abnormal posture - Plan: PT plan of care cert/re-cert   Patient will follow up at outpatient cancer rehab 3-4 weeks following surgery.  If the patient requires physical therapy at that time, a specific plan will be dictated and sent to  the referring physician for approval. The patient was educated today on appropriate basic range of motion exercises to begin post operatively and the importance of attending the After Breast Cancer class following surgery.  Patient was educated today on lymphedema risk reduction practices as it pertains to recommendations that will benefit the patient immediately following surgery.  She verbalized good understanding.     Problem List Patient Active Problem List   Diagnosis Date Noted  . Malignant neoplasm of upper-outer quadrant of left breast in female, estrogen receptor positive ([place]) 01/18/2019  . Other allergic rhinitis 12/03/2014   Annia Friendly, PT 01/23/19 3:23 PM  [place], [place], [address] Phone: [phone]   Fax:  [phone]  Name: BRYANT LIPPS MRN: [id number] Date of Birth: 1975/06/13

## 2019-01-23 NOTE — Assessment & Plan Note (Signed)
01/18/2019: Patient palpated a left breast lump for 2 weeks. Mammogram showed a 2.3cm left breast mass at the 12 o'clock position, with 1 enlarged left axillary lymph node measuring 3.5cm. 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 T2N1 stage IIa clinical stage  Pathology and radiology counseling: Discussed with the patient, the details of pathology including the type of breast cancer,the clinical staging, the significance of ER, PR and HER-2/neu receptors and the implications for treatment. After reviewing the pathology in detail, we proceeded to discuss the different treatment options between surgery, radiation, chemotherapy, antiestrogen therapies.  Recommendation based on multidisciplinary tumor board: 1. Neoadjuvant chemotherapy with TCH Perjeta 6 cycles followed by Herceptin maintenance for 1 year 2. Followed by breast conserving surgery if possible with sentinel lymph node study 3. Followed by adjuvant radiation therapy if patient had lumpectomy  Chemotherapy Counseling: I discussed the risks and benefits of chemotherapy including the risks of nausea/ vomiting, risk of infection from low WBC count, fatigue due to chemo or anemia, bruising or bleeding due to low platelets, mouth sores, loss/ change in taste and decreased appetite. Liver and kidney function will be monitored through out chemotherapy as abnormalities in liver and kidney function may be a side effect of treatment. Cardiac dysfunction due to Herceptin and Perjeta were discussed in detail. Risk of permanent bone marrow dysfunction due to chemo were also discussed.  Plan: 1. Port placement 2. Echocardiogram 3. Chemotherapy class 4. Breast MRI 5.  Genetic testing  Recommended participation in the neuropathy clinical trial SWOG 1714  Return to clinic in 2 weeks to start chemotherapy.

## 2019-01-23 NOTE — Patient Instructions (Signed)

## 2019-01-23 NOTE — Progress Notes (Signed)
REFERRING PROVIDER: Nicholas Lose, MD [place],  [place] [address]  PRIMARY PROVIDER:  Servando Salina, MD  PRIMARY REASON FOR VISIT:  1. Family history of throat cancer   2. Family history of prostate cancer   3. Family history of breast cancer   4. Malignant neoplasm of upper-outer quadrant of left breast in female, estrogen receptor positive ([place])    I connected with Paige Perry on 01/23/2019 at 3:00 PM EDT by Webex and verified that I am speaking with the correct person using two identifiers.    Patient location: [facility] Provider location: office  HISTORY OF PRESENT ILLNESS:   Paige Perry, a 43 y.o. female, was seen for a [facility] cancer genetics consultation at the request of Dr. [place]i Perry due to a personal and family history of cancer.  Paige Perry presents to clinic today to discuss the possibility of a hereditary predisposition to cancer, genetic testing, and to further clarify her future cancer risks, as well as potential cancer risks for family members.   In 2020, at the age of 67, Paige Perry was diagnosed with IDC of the left breast. The treatment plan includes neoadjuvant chemotherapy, surgery, possible radiation.   CANCER HISTORY:  Oncology History  Malignant neoplasm of upper-outer quadrant of left breast in female, estrogen receptor positive ([place])  01/18/2019 Initial Diagnosis   Patient palpated a left breast lump for 2 weeks. Mammogram showed a 2.3cm left breast mass at the 12 o'clock position, with 1 enlarged left axillary lymph node measuring 3.5cm. 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   01/23/2019 Cancer Staging   Staging form: Breast, AJCC 8th Edition - Clinical stage from 01/23/2019: Stage IIA (cT2, cN1(f), cM0, G2, ER+, PR-, HER2+) - Signed by Paige Lose, MD on 01/23/2019      RISK FACTORS:  Menarche was at age 9.  First live birth at age 29.  Ovaries intact: yes.  Hysterectomy: no.  Menopausal  status: premenopausal.  HRT use: 0 years. Colonoscopy: no; not examined. Mammogram within the last year: yes. Number of breast biopsies: 1. Up to date with pelvic exams: yes. Any excessive radiation exposure in the past: no  Past Medical History:  Diagnosis Date  . Family history of breast cancer   . Family history of breast cancer   . Family history of prostate cancer   . Family history of throat cancer   . Hypercholesteremia     Past Surgical History:  Procedure Laterality Date  . FINGER SURGERY      Social History   Socioeconomic History  . Marital status: Single    Spouse name: Not on file  . Number of children: Not on file  . Years of education: Not on file  . Highest education level: Not on file  Occupational History  . Not on file  Social Needs  . Financial resource strain: Not on file  . Food insecurity    Worry: Not on file    Inability: Not on file  . Transportation needs    Medical: Not on file    Non-medical: Not on file  Tobacco Use  . Smoking status: Never Smoker  . Smokeless tobacco: Never Used  Substance and Sexual Activity  . Alcohol use: Yes    Comment: occasionally  . Drug use: No  . Sexual activity: Yes    Birth control/protection: None  Lifestyle  . Physical activity    Days per week: Not on file    Minutes  per session: Not on file  . Stress: Not on file  Relationships  . Social Herbalist on phone: Not on file    Gets together: Not on file    Attends religious service: Not on file    Active member of club or organization: Not on file    Attends meetings of clubs or organizations: Not on file    Relationship status: Not on file  Other Topics Concern  . Not on file  Social History Narrative  . Not on file     FAMILY HISTORY:  We obtained a detailed, 4-generation family history.  Significant diagnoses are listed below: Family History  Problem Relation Age of Onset  . Hypertension Mother   . Heart failure Mother   .  Breast cancer Maternal Aunt        dx 66s  . Breast cancer Paternal Aunt   . Prostate cancer Father 56  . Throat cancer Cousin     Paige Perry has 2 sons, ages 50 and 57, no history of cancer. Paige Perry has 1 sister, Paige Perry, who is 64 and has never had cancer.   Ms. Costilow mother is living at 39, no history of cancer. Patient has 2 maternal aunts and 3 maternal uncles. One of her aunts had breast cancer in her 22s and is living in her 3s. A maternal cousin had throat cancer. Maternal grandmother died at 27, no cancer. Limited information about maternal grandfather.  Paige Perry father passed this August at age 69. He had prostate cancer diagnosed at 86. Patient has 66 aunts and uncles, some are through paternal grandmother some are through paternal grandfather. 2 aunts through her paternal grandfather both had breast cancer, unsure of ages of diagnosis. No known cancers in paternal cousins. Paternal grandmother is living at 87, grandfather deceased, unsure of age.   Ms. Flinders is unaware of previous family history of genetic testing for hereditary cancer risks. There is no reported Ashkenazi Jewish ancestry. There is no known consanguinity.  GENETIC COUNSELING ASSESSMENT: Paige Perry is a 43 y.o. female with a personal and family history which is somewhat suggestive of a hereditary cancer syndrome and predisposition to cancer. We, therefore, discussed and recommended the following at today's visit.   DISCUSSION: We discussed that 5 - 10% of breast cancer is hereditary, with most cases associated with BRCA1/BRCA2 mutations.  There are other genes that can be associated with hereditary breast cancer syndromes. We discussed that testing is beneficial for several reasons including surgical decision-making for breast cancer, knowing how to follow individuals after completing their treatment, and understand if other family members could be at risk for cancer and allow them to undergo genetic testing.   We  reviewed the characteristics, features and inheritance patterns of hereditary cancer syndromes. We also discussed genetic testing, including the appropriate family members to test, the process of testing, insurance coverage and turn-around-time for results. We discussed the implications of a negative, positive and/or variant of uncertain significant result. We recommended Paige Perry pursue genetic testing for the [facility] Common Hereditary Cancers  gene panel.   The Common Hereditary Cancers Panel offered by [facility] includes sequencing and/or deletion duplication testing of the following 48 genes: APC, ATM, AXIN2, BARD1, BMPR1A, BRCA1, BRCA2, BRIP1, CDH1, CDKN2A (p14ARF), CDKN2A (p16INK4a), CKD4, CHEK2, CTNNA1, DICER1, EPCAM (Deletion/duplication testing only), GREM1 (promoter region deletion/duplication testing only), KIT, MEN1, MLH1, MSH2, MSH3, MSH6, MUTYH, NBN, NF1, NHTL1, PALB2, PDGFRA, PMS2, POLD1, POLE, PTEN, RAD50, RAD51C, RAD51D, RNF43,  SDHB, SDHC, SDHD, SMAD4, SMARCA4. STK11, TP53, TSC1, TSC2, and VHL.  The following genes were evaluated for sequence changes only: SDHA and HOXB13 c.251G>A variant only.  Based on Paige Perry's personal and family history of cancer, she meets medical criteria for genetic testing. Despite that she meets criteria, she may still have an out of pocket cost.   PLAN: After considering the risks, benefits, and limitations, Paige Perry provided informed consent to pursue genetic testing and the blood sample was sent to [facility] for analysis of the Common Hereditary Cancers Panel. Results should be available within approximately 2-3 weeks' time, at which point they will be disclosed by telephone to Paige Perry, as will any additional recommendations warranted by these results. Paige Perry will receive a summary of her genetic counseling visit and a copy of her results once available. This information will also be available in Epic.   Paige Perry questions were answered to  her satisfaction today. Our contact information was provided should additional questions or concerns arise. Thank you for the referral and allowing [place] to share in the care of your patient.   Faith Rogue, MS, [facility] Genetic Counselor [email or web address] Phone: [phone]  The patient was seen for a total of 15 minutes in virtual genetic counseling. Her sister Paige Perry was also present in the room with her.  Drs. Magrinat, Paige Perry and/or Burr Medico were available for discussion regarding this case.   _______________________________________________________________________ For Office Staff:  Number of people involved in session: 2 Was an Intern/ student involved with case: no

## 2019-01-24 NOTE — Progress Notes (Signed)
[facility] Psychosocial Distress Screening Spiritual Care  Met with Paige Perry and her sister Paige Perry (spelling?) in Breast Multidisciplinary Clinic to introduce [place] team/resources, reviewing distress screen per protocol.  The patient scored a 0 on the Psychosocial Distress Thermometer which indicates minimal distress. Also assessed for distress and other psychosocial needs.   ONCBCN DISTRESS SCREENING 01/24/2019  Screening Type Initial Screening  Distress experienced in past week (1-10) 0  Referral to support programs Yes   Paige Perry and her sister were impressed with the variety of [place] team/programming resources. Paige Perry reports good support from sister as a key coping tool.  Follow up needed: No. Encouraged both sisters to contact Team as needed/desired, highlighting resources that are available for caregivers as well. Please also page if needs arise or circumstances change. Thank you.   [place], [place], [facility] Pager [phone] Voicemail [phone]

## 2019-01-25 ENCOUNTER — Telehealth: Payer: Self-pay | Admitting: Hematology and Oncology

## 2019-01-25 NOTE — Telephone Encounter (Signed)
Scheduled appt per 10/30 sch message - unable to reach pt- left message with appt date and time

## 2019-01-29 ENCOUNTER — Ambulatory Visit (HOSPITAL_COMMUNITY)
Admission: RE | Admit: 2019-01-29 | Discharge: 2019-01-29 | Disposition: A | Discharge status: Home / Self Care | Payer: BC Managed Care – PPO | Source: Ambulatory Visit | Attending: Hematology and Oncology | Admitting: Hematology and Oncology

## 2019-01-29 ENCOUNTER — Ambulatory Visit (HOSPITAL_BASED_OUTPATIENT_CLINIC_OR_DEPARTMENT_OTHER)
Admission: RE | Admit: 2019-01-29 | Discharge: 2019-01-29 | Disposition: A | Discharge status: Home / Self Care | Payer: BC Managed Care – PPO | Source: Ambulatory Visit | Attending: Hematology and Oncology | Admitting: Hematology and Oncology

## 2019-01-29 ENCOUNTER — Other Ambulatory Visit: Payer: BC Managed Care – PPO

## 2019-01-29 ENCOUNTER — Other Ambulatory Visit: Payer: Self-pay

## 2019-01-29 ENCOUNTER — Telehealth: Payer: Self-pay | Admitting: *Deleted

## 2019-01-29 ENCOUNTER — Encounter (HOSPITAL_BASED_OUTPATIENT_CLINIC_OR_DEPARTMENT_OTHER): Payer: Self-pay | Admitting: *Deleted

## 2019-01-29 DIAGNOSIS — C50412 Malignant neoplasm of upper-outer quadrant of left female breast: Secondary | ICD-10-CM

## 2019-01-29 DIAGNOSIS — Z170 Estrogen receptor positive status [ER+]: Secondary | ICD-10-CM | POA: Insufficient documentation

## 2019-01-29 IMAGING — MR MR BREAST BILAT WO/W CM
6 of 13 series · 19 of 48 positions shown · IV contrast (Yes)
Comparison: Previous exam(s).

CLINICAL DATA: 43-year-old female with newly diagnosed left breast
grade 3 invasive ductal carcinoma and a metastatic left axillary
lymph node.

LABS:  None performed today on site.
EXAM:
BILATERAL BREAST MRI WITH AND WITHOUT CONTRAST
TECHNIQUE: Multiplanar, multisequence MR images of both breasts were obtained
prior to and following the intravenous administration of 8 ml of
Gadavist.

[Series 1: acess#(phone_number) · axial · 7.0mm · 1.56mm/px · 1 of 25 slices shown]
[im 1/25]
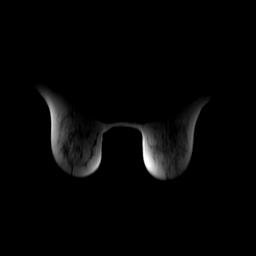

[Series 3: T1 · axial · non-contrast · 1.8mm · 0.61mm/px · z∈[-148,+63]mm · 5 of 236 slices shown]
[im 1/236]
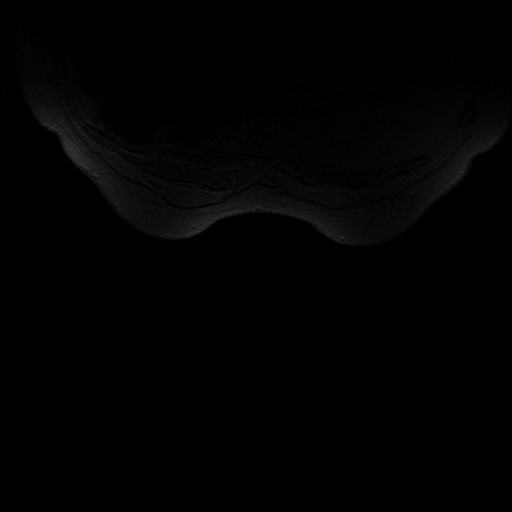
[im 59/236]
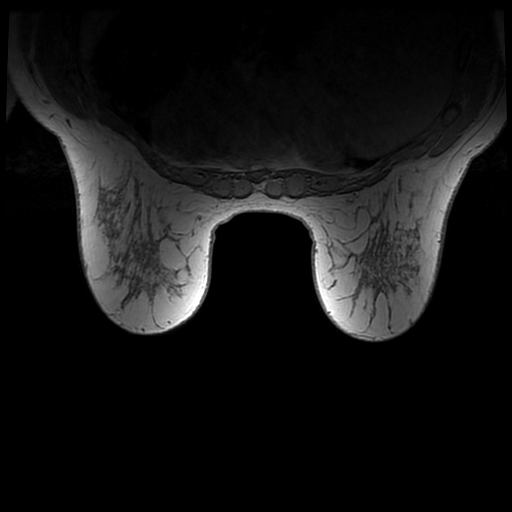
[im 118/236]
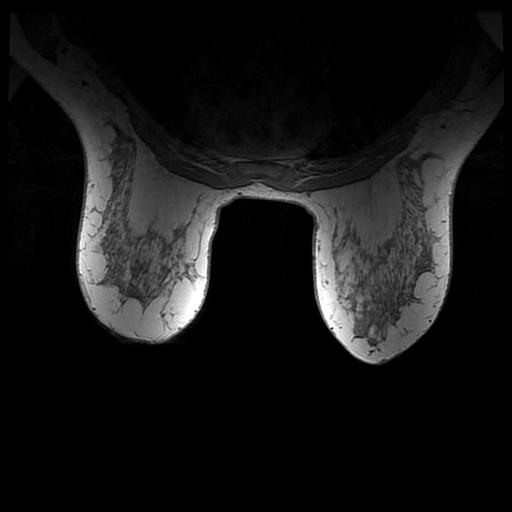
[im 177/236]
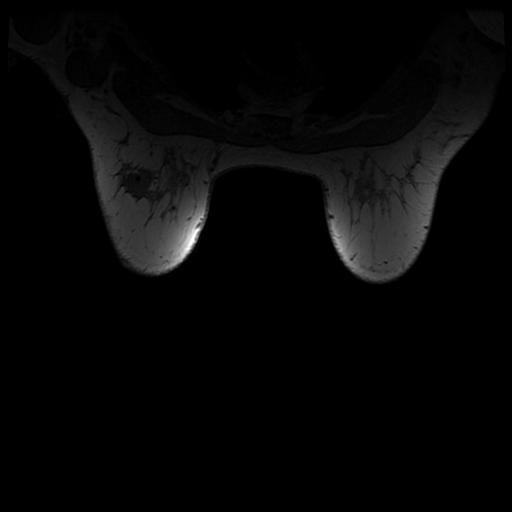
[im 236/236]
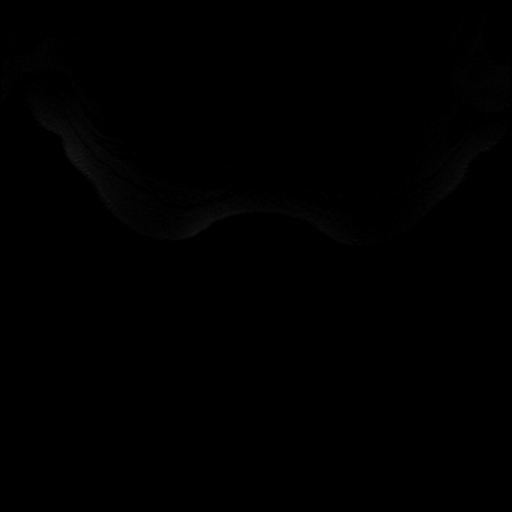

[Series 5: T2 · axial · 3.0mm · 0.61mm/px · 1 of 71 slices shown]
[im 1/71]
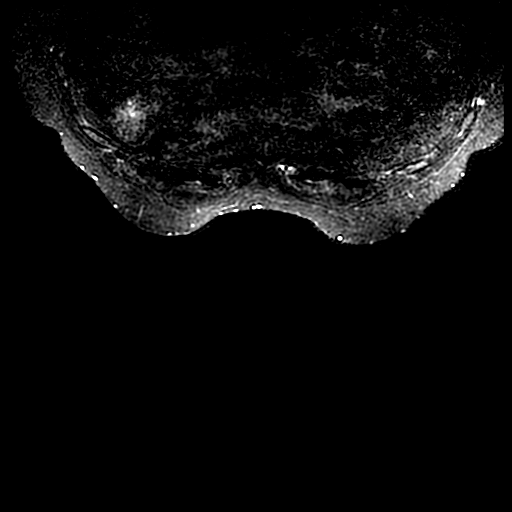

[Series 600: T1 fat-sat · axial · 1.8mm · 0.61mm/px · z∈[-148,+63]mm · 5 of 236 slices shown (1 of 3)]
[im 1/236]
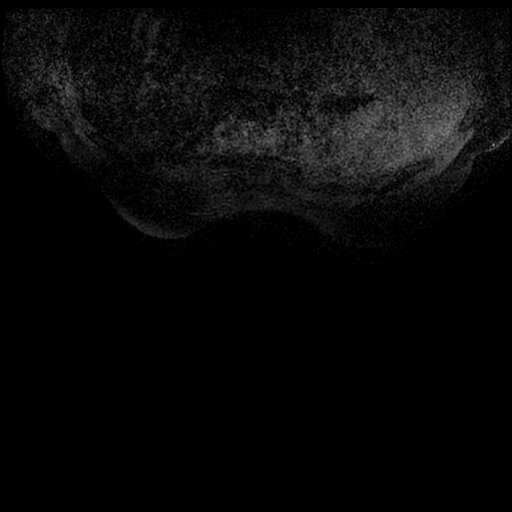
[im 59/236]
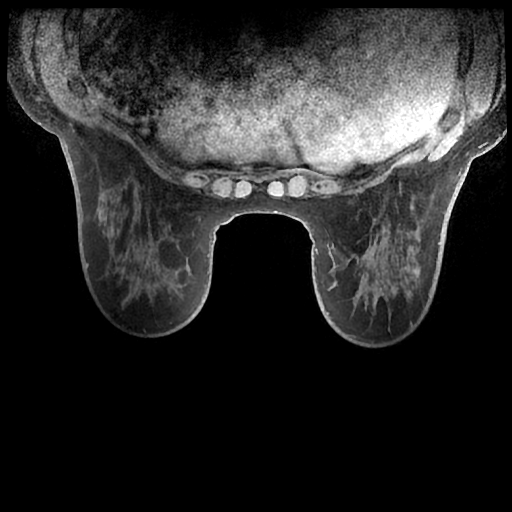
[im 118/236]
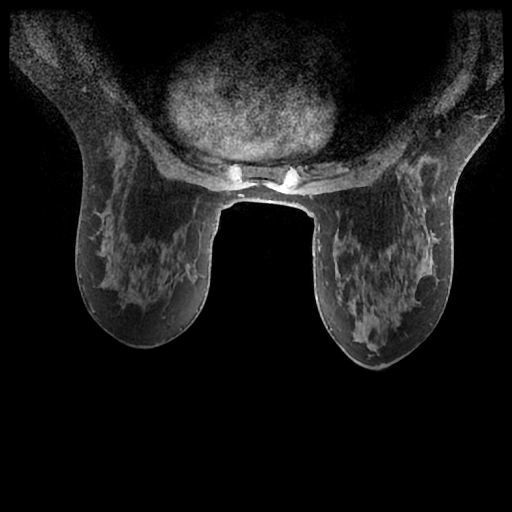
[im 177/236]
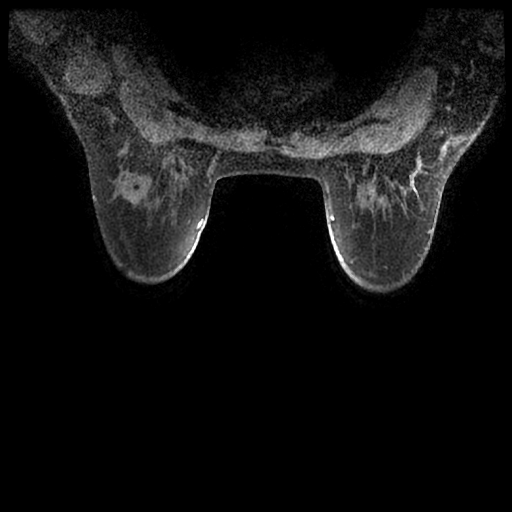
[im 236/236]
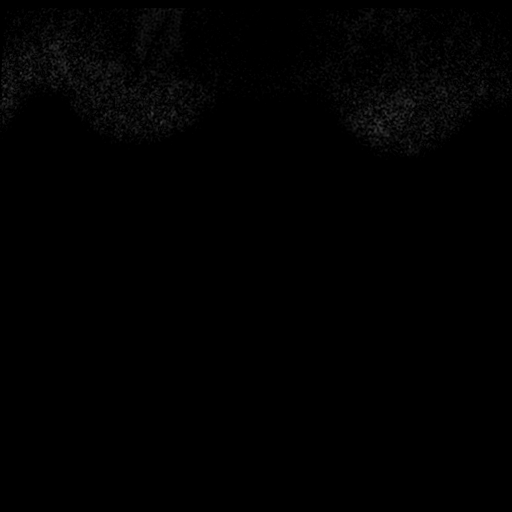

[Series 601: T1 fat-sat · axial · 1.8mm · 0.61mm/px · z∈[-148,+63]mm · 5 of 236 slices shown (2 of 3)]
[im 1/236]
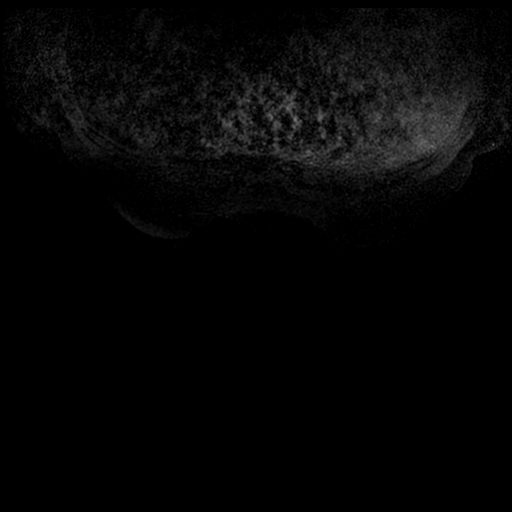
[im 59/236]
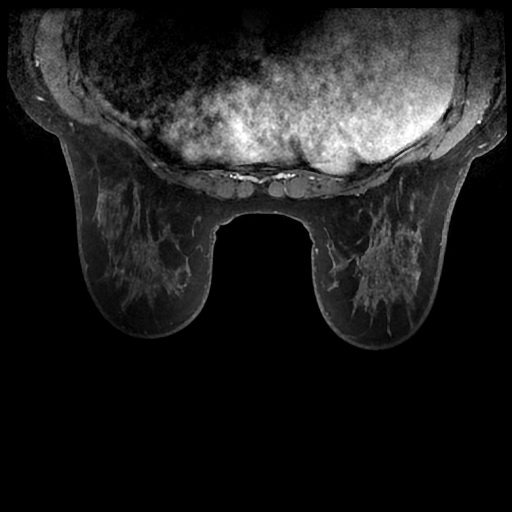
[im 118/236]
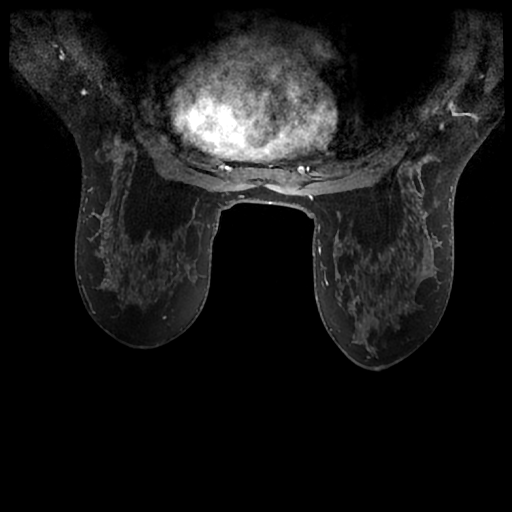
[im 177/236]
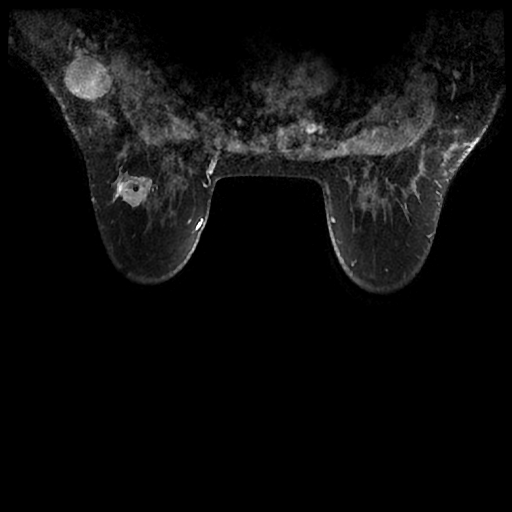
[im 236/236]
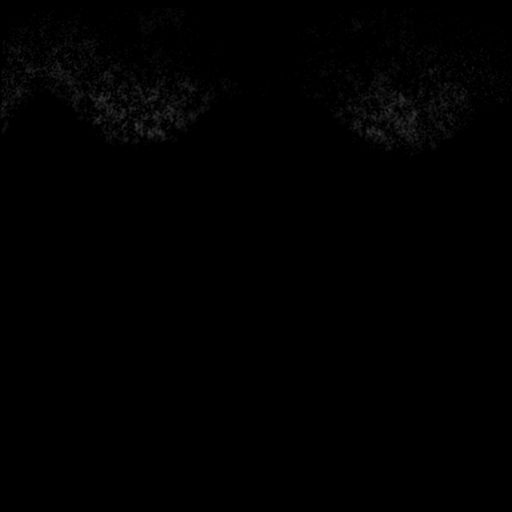

[Series 602: T1 fat-sat · axial · 1.8mm · 0.61mm/px · z∈[-148,-96]mm · 2 of 236 slices shown (3 of 3)]
[im 1/236]
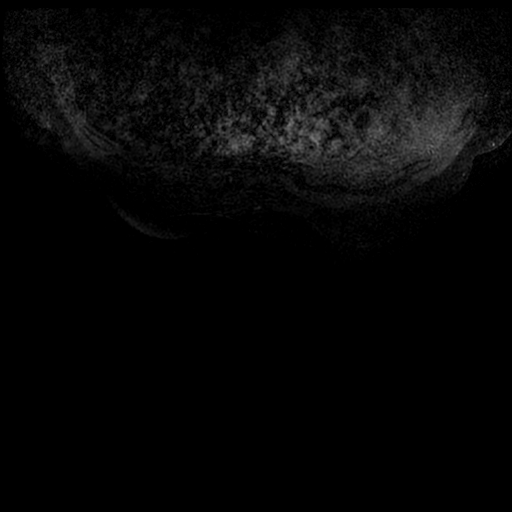
[im 59/236]
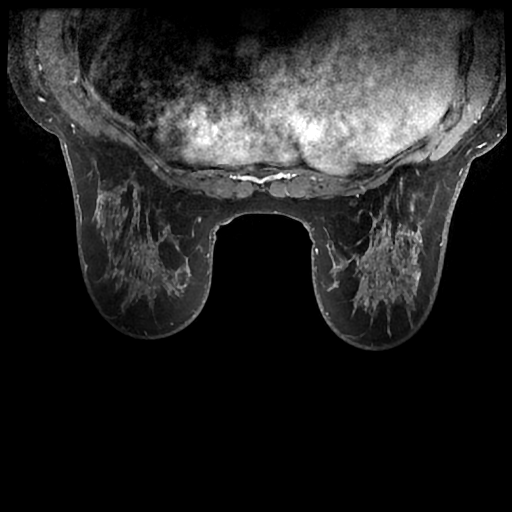

[19 of 48 positions shown; findings below may reference images not displayed]

Three-dimensional MR images were rendered by post-processing of the
original MR data on an independent workstation. The
three-dimensional MR images were interpreted, and findings are
reported in the following complete MRI report for this study. Three
dimensional images were evaluated at the independent [employer]
workstation
FINDINGS: Breast composition: c. Heterogeneous fibroglandular tissue.

Background parenchymal enhancement: Mild-to-moderate.

Right breast: No mass or abnormal enhancement.

Left breast an irregular, enhancing mass with associated post biopsy
clip is demonstrated in the upper outer left breast posteriorly. It
measures 2.2 x 2.1 x 2.3 cm. This is consistent with the patient's
biopsied site of disease. There is no evidence for involvement of
the underlying pectoralis muscle. No other sites of suspicious mass
or abnormal enhancement within the left breast.

Lymph nodes: Two enlarged, morphologically abnormal level 1 lymph
nodes are noted in the left axilla. The largest of these
demonstrates susceptibility artifact from post biopsy clip. No
suspicious right axillary or internal mammary lymphadenopathy.

Ancillary findings:  None.
IMPRESSION: 1. 2.3 cm enhancing mass in the upper outer left breast consistent
with the patient's biopsy-proven site of malignancy.
2. Two abnormal level 1 left axillary lymph nodes, 1 of which is
consistent with biopsy proven site of metastatic disease.
3. No MRI evidence of malignancy on the right.

RECOMMENDATION:
Per clinical treatment plan.

BI-RADS CATEGORY  6: Known biopsy-proven malignancy.

## 2019-01-29 MED ORDER — GADOBUTROL 1 MMOL/ML IV SOLN
8.0000 mL | Freq: Once | INTRAVENOUS | Status: AC | PRN
  Administered 2019-01-29: 8 mL via INTRAVENOUS

## 2019-01-29 NOTE — Telephone Encounter (Signed)
Spoke with patient to follow up from [facility] to assess navigation needs.  Patient states she is just tired from all the appointments she has had.  Encouraged her to call with any needs or concerns.

## 2019-01-29 NOTE — Progress Notes (Signed)
*   Echocardiogram 2D Echocardiogram has been performed.  Paige Perry 01/29/2019, 11:03 AM

## 2019-01-31 ENCOUNTER — Encounter: Payer: Self-pay | Admitting: *Deleted

## 2019-01-31 ENCOUNTER — Other Ambulatory Visit: Payer: Self-pay

## 2019-01-31 ENCOUNTER — Inpatient Hospital Stay: Payer: BC Managed Care – PPO | Attending: Hematology and Oncology

## 2019-01-31 ENCOUNTER — Encounter: Payer: Self-pay | Admitting: Hematology and Oncology

## 2019-01-31 DIAGNOSIS — E785 Hyperlipidemia, unspecified: Secondary | ICD-10-CM | POA: Insufficient documentation

## 2019-01-31 DIAGNOSIS — C50412 Malignant neoplasm of upper-outer quadrant of left female breast: Secondary | ICD-10-CM | POA: Insufficient documentation

## 2019-01-31 DIAGNOSIS — Z791 Long term (current) use of non-steroidal anti-inflammatories (NSAID): Secondary | ICD-10-CM | POA: Insufficient documentation

## 2019-01-31 DIAGNOSIS — R197 Diarrhea, unspecified: Secondary | ICD-10-CM | POA: Insufficient documentation

## 2019-01-31 DIAGNOSIS — Z5112 Encounter for antineoplastic immunotherapy: Secondary | ICD-10-CM | POA: Insufficient documentation

## 2019-01-31 DIAGNOSIS — K219 Gastro-esophageal reflux disease without esophagitis: Secondary | ICD-10-CM | POA: Insufficient documentation

## 2019-01-31 DIAGNOSIS — J029 Acute pharyngitis, unspecified: Secondary | ICD-10-CM | POA: Insufficient documentation

## 2019-01-31 DIAGNOSIS — Z170 Estrogen receptor positive status [ER+]: Secondary | ICD-10-CM | POA: Insufficient documentation

## 2019-01-31 DIAGNOSIS — Z5189 Encounter for other specified aftercare: Secondary | ICD-10-CM | POA: Insufficient documentation

## 2019-01-31 DIAGNOSIS — Z793 Long term (current) use of hormonal contraceptives: Secondary | ICD-10-CM | POA: Insufficient documentation

## 2019-01-31 DIAGNOSIS — Z79899 Other long term (current) drug therapy: Secondary | ICD-10-CM | POA: Insufficient documentation

## 2019-01-31 DIAGNOSIS — Z5111 Encounter for antineoplastic chemotherapy: Secondary | ICD-10-CM | POA: Insufficient documentation

## 2019-01-31 NOTE — Research (Signed)
01/31/2019 Research - DCP-001 Met with patient in clinic today following her chemotherapy education class. Patient has decided against participation in the [facility] S1714 research study. However, she is willing to consider participation in the DCP-001 study discussed with her last week. Patient was given a copy of the consent and authorization forms for the DCP-001 study, along with my business card. Plans made to meet with patient at her next visit to discuss this further. Thanked patient for her willingness to consider participation in this study. Cindy S. Brigitte Pulse BSN, RN, CCRP 01/31/2019 12:19 PM

## 2019-01-31 NOTE — Progress Notes (Signed)
Enrolled patient in copay assistance program via [place] portal for [place].  Patient approved for $25,000 per 47-month eligibility effective 01/31/19 with her out of pocket responsibility being as little as $5 copay per infusion after insurance pays. Copy of approval letter given to [facility] for billing/copay submissions. Patient will receive a copy in the mail as well for her records only.  Will hold off on enrolling in injection copay to prevent deadline of submissions. Billing will be monitored to determine need and applications would be submitted at that time.

## 2019-01-31 NOTE — Progress Notes (Signed)
Met with patient in lobby to introduce myself as Arboriculturist and to offer available resources.  Discussed one-time $1000 Radio broadcast assistant to assist with personal expenses while going through treatment. Patient states based on guidelines, she is over the income guidelines. Advised her to contact me should anything change.  Also advised of available copay assistance for injection receiving after treatment if she has not met her ded/OOP for insurance. She states she has met her deductible but not OOP so insurance is only covering at 80%. Advised I can enroll her with her permission and she consented.  She has my card for any additional financial questions or concerns.

## 2019-01-31 NOTE — Research (Signed)
01/31/2019 Research - SWOG T2458 screening Met with patient in clinic today following her chemotherapy education class. Patient states that she has decided against participation in the [place] research study due to concern about the mandatory sample to be collected via venipuncture. Thanked patient for her willingness to consider participating. Cindy S. Brigitte Pulse BSN, RN, CCRP 01/31/2019 12:07 PM

## 2019-02-01 ENCOUNTER — Other Ambulatory Visit (HOSPITAL_COMMUNITY)
Admission: RE | Admit: 2019-02-01 | Discharge: 2019-02-01 | Disposition: A | Discharge status: Home / Self Care | Payer: BC Managed Care – PPO | Source: Ambulatory Visit | Attending: Surgery | Admitting: Surgery

## 2019-02-01 ENCOUNTER — Telehealth: Payer: Self-pay | Admitting: Licensed Clinical Social Worker

## 2019-02-01 ENCOUNTER — Ambulatory Visit: Payer: Self-pay | Admitting: Licensed Clinical Social Worker

## 2019-02-01 ENCOUNTER — Encounter: Payer: Self-pay | Admitting: Licensed Clinical Social Worker

## 2019-02-01 DIAGNOSIS — Z20828 Contact with and (suspected) exposure to other viral communicable diseases: Secondary | ICD-10-CM | POA: Insufficient documentation

## 2019-02-01 DIAGNOSIS — Z01812 Encounter for preprocedural laboratory examination: Secondary | ICD-10-CM | POA: Diagnosis present

## 2019-02-01 DIAGNOSIS — Z8042 Family history of malignant neoplasm of prostate: Secondary | ICD-10-CM

## 2019-02-01 DIAGNOSIS — Z800 Family history of malignant neoplasm of digestive organs: Secondary | ICD-10-CM

## 2019-02-01 DIAGNOSIS — Z1379 Encounter for other screening for genetic and chromosomal anomalies: Secondary | ICD-10-CM

## 2019-02-01 DIAGNOSIS — Z170 Estrogen receptor positive status [ER+]: Secondary | ICD-10-CM

## 2019-02-01 DIAGNOSIS — Z803 Family history of malignant neoplasm of breast: Secondary | ICD-10-CM

## 2019-02-01 DIAGNOSIS — C50412 Malignant neoplasm of upper-outer quadrant of left female breast: Secondary | ICD-10-CM

## 2019-02-01 NOTE — Progress Notes (Signed)

## 2019-02-01 NOTE — Telephone Encounter (Signed)
Revealed negative genetic testing.  This normal result is reassuring and indicates that it is unlikely Paige Perry's cancer is due to a hereditary cause.  It is unlikely that there is an increased risk of another cancer due to a mutation in one of these genes.  However, genetic testing is not perfect, and cannot definitively rule out a hereditary cause.  It will be important for her to keep in contact with genetics to learn if any additional testing may be needed in the future.

## 2019-02-01 NOTE — Progress Notes (Addendum)
HPI:  Paige Perry was previously seen in the [place] clinic due to a personal and family history of cancer and concerns regarding a hereditary predisposition to cancer. Please refer to our prior cancer genetics clinic note for more information regarding our discussion, assessment and recommendations, at the time. Paige Perry recent genetic test results were disclosed to her, as were recommendations warranted by these results. These results and recommendations are discussed in more detail below.  CANCER HISTORY:  Oncology History  Malignant neoplasm of upper-outer quadrant of left breast in female, estrogen receptor positive ([place])  01/18/2019 Initial Diagnosis   Patient palpated a left breast lump for 2 weeks. Mammogram showed a 2.3cm left breast mass at the 12 o'clock position, with 1 enlarged left axillary lymph node measuring 3.5cm. 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   01/23/2019 Cancer Staging   Staging form: Breast, AJCC 8th Edition - Clinical stage from 01/23/2019: Stage IIA (cT2, cN1(f), cM0, G2, ER+, PR-, HER2+) - Signed by Nicholas Lose, MD on 01/23/2019   02/01/2019 -  Chemotherapy   The patient had palonosetron (ALOXI) injection 0.25 mg, 0.25 mg, Intravenous,  Once, 0 of 6 cycles pegfilgrastim-jmdb (FULPHILA) injection 6 mg, 6 mg, Subcutaneous,  Once, 0 of 6 cycles CARBOplatin (PARAPLATIN) 700 mg in sodium chloride 0.9 % 250 mL chemo infusion, 700 mg (100 % of original dose 700 mg), Intravenous,  Once, 0 of 6 cycles Dose modification: 700 mg (original dose 700 mg, Cycle 1) DOCEtaxel (TAXOTERE) 140 mg in sodium chloride 0.9 % 250 mL chemo infusion, 75 mg/m2 = 140 mg, Intravenous,  Once, 0 of 6 cycles pertuzumab (PERJETA) 420 mg in sodium chloride 0.9 % 250 mL chemo infusion, 420 mg (100 % of original dose 420 mg), Intravenous, Once, 0 of 6 cycles Dose modification: 420 mg (original dose 420 mg, Cycle 1, Reason: Provider  Judgment) fosaprepitant (EMEND) 150 mg, dexamethasone (DECADRON) 12 mg in sodium chloride 0.9 % 145 mL IVPB, , Intravenous,  Once, 0 of 6 cycles trastuzumab-dkst (OGIVRI) 651 mg in sodium chloride 0.9 % 250 mL chemo infusion, 8 mg/kg = 651 mg, Intravenous,  Once, 0 of 6 cycles  for chemotherapy treatment.     Genetic Testing   Negative genetic testing. No pathogenic variants identified on the [facility] Common Hereditary Cancers Panel. The Common Hereditary Cancers Panel offered by [facility] includes sequencing and/or deletion duplication testing of the following 48 genes: APC, ATM, AXIN2, BARD1, BMPR1A, BRCA1, BRCA2, BRIP1, CDH1, CDKN2A (p14ARF), CDKN2A (p16INK4a), CKD4, CHEK2, CTNNA1, DICER1, EPCAM (Deletion/duplication testing only), GREM1 (promoter region deletion/duplication testing only), KIT, MEN1, MLH1, MSH2, MSH3, MSH6, MUTYH, NBN, NF1, NHTL1, PALB2, PDGFRA, PMS2, POLD1, POLE, PTEN, RAD50, RAD51C, RAD51D, RNF43, SDHB, SDHC, SDHD, SMAD4, SMARCA4. STK11, TP53, TSC1, TSC2, and VHL.  The following genes were evaluated for sequence changes only: SDHA and HOXB13 c.251G>A variant only. The report date is 02/01/2019.      FAMILY HISTORY:  We obtained a detailed, 4-generation family history.  Significant diagnoses are listed below: Family History  Problem Relation Age of Onset   Hypertension Mother    Heart failure Mother    Breast cancer Maternal Aunt        dx 36s   Breast cancer Paternal Aunt    Prostate cancer Father 80   Throat cancer Cousin    Ms. Durio has 2 sons, ages 60 and 35, no history of cancer. Paige Perry has 1 sister, [name], who is 59 and has  never had cancer.   Paige Perry mother is living at 53, no history of cancer. Patient has 2 maternal aunts and 3 maternal uncles. One of her aunts had breast cancer in her 36s and is living in her 58s. A maternal cousin had throat cancer. Maternal grandmother died at 4, no cancer. Limited information about maternal grandfather.  Ms.  Perry father passed this August at age 62. He had prostate cancer diagnosed at 21. Patient has 23 aunts and uncles, some are through paternal grandmother some are through paternal grandfather. 2 aunts through her paternal grandfather both had breast cancer, unsure of ages of diagnosis. No known cancers in paternal cousins. Paternal grandmother is living at 49, grandfather deceased, unsure of age.   Paige Perry is unaware of previous family history of genetic testing for hereditary cancer risks. There is no reported Ashkenazi Jewish ancestry. There is no known consanguinity.   GENETIC TEST RESULTS: Genetic testing reported out on 02/01/2019 through the [facility] Common Hereditary cancer panel found no pathogenic mutations.The Common Hereditary Cancers Panel offered by [facility] includes sequencing and/or deletion duplication testing of the following 48 genes: APC, ATM, AXIN2, BARD1, BMPR1A, BRCA1, BRCA2, BRIP1, CDH1, CDKN2A (p14ARF), CDKN2A (p16INK4a), CKD4, CHEK2, CTNNA1, DICER1, EPCAM (Deletion/duplication testing only), GREM1 (promoter region deletion/duplication testing only), KIT, MEN1, MLH1, MSH2, MSH3, MSH6, MUTYH, NBN, NF1, NHTL1, PALB2, PDGFRA, PMS2, POLD1, POLE, PTEN, RAD50, RAD51C, RAD51D, RNF43, SDHB, SDHC, SDHD, SMAD4, SMARCA4. STK11, TP53, TSC1, TSC2, and VHL.  The following genes were evaluated for sequence changes only: SDHA and HOXB13 c.251G>A variant only. The test report has been scanned into EPIC and is located under the Molecular Pathology section of the Results Review tab.  A portion of the result report is included below for reference.       We discussed with [name] that because current genetic testing is not perfect, it is possible there may be a gene mutation in one of these genes that current testing cannot detect, but that chance is small.  We also discussed, that there could be another gene that has not yet been discovered, or that we have not yet tested, that is responsible for  the cancer diagnoses in the family. It is also possible there is a hereditary cause for the cancer in the family that [name] did not inherit and therefore was not identified in her testing.  Therefore, it is important to remain in touch with cancer genetics in the future so that we can continue to offer Ms. Edmonds the most up to date genetic testing.   ADDITIONAL GENETIC TESTING: We discussed with Ms. Montejano that her genetic testing was fairly extensive.  If there are genes identified to increase cancer risk that can be analyzed in the future, we would be happy to discuss and coordinate this testing at that time.    CANCER SCREENING RECOMMENDATIONS: Ms. Maniscalco test result is considered negative (normal).  This means that we have not identified a hereditary cause for her personal and family history of cancer at this time. Most cancers happen by chance and this negative test suggests that her cancer may fall into this category.    While reassuring, this does not definitively rule out a hereditary predisposition to cancer. It is still possible that there could be genetic mutations that are undetectable by current technology. There could be genetic mutations in genes that have not been tested or identified to increase cancer risk.  Therefore, it is recommended she continue to follow the cancer  management and screening guidelines provided by her oncology and primary healthcare provider.   An individual's cancer risk and medical management are not determined by genetic test results alone. Overall cancer risk assessment incorporates additional factors, including personal medical history, family history, and any available genetic information that may result in a personalized plan for cancer prevention and surveillance.  RECOMMENDATIONS FOR FAMILY MEMBERS:  Relatives in this family might be at some increased risk of developing cancer, over the general population risk, simply due to the family history of cancer.   We recommended female relatives in this family have a yearly mammogram beginning at age 57, or 85 years younger than the earliest onset of cancer, an annual clinical breast exam, and perform monthly breast self-exams. Female relatives in this family should also have a gynecological exam as recommended by their primary provider. All family members should have a colonoscopy by age 4, or as directed by their physicians.   It is also possible there is a hereditary cause for the cancer in Ms. Kaylor's family that she did not inherit and therefore was not identified in her.  Based on PaigeDains's family history, we recommended her maternal aunt who had breast cancer in her 27s have genetic counseling and testing. Ms. Bernales will let us know if we can be of any assistance in coordinating genetic counseling and/or testing for this family member.  FOLLOW-UP: Lastly, we discussed with Ms. Landess that cancer genetics is a rapidly advancing field and it is possible that new genetic tests will be appropriate for her and/or her family members in the future. We encouraged her to remain in contact with cancer genetics on an annual basis so we can update her personal and family histories and let her know of advances in cancer genetics that may benefit this family.   Our contact number was provided. Ms. Woodyard questions were answered to her satisfaction, and she knows she is welcome to call us at anytime with additional questions or concerns.   Faith Rogue, MS, [facility] Genetic Counselor [email or web address] Phone: [phone]

## 2019-02-02 LAB — NOVEL CORONAVIRUS, NAA (HOSP ORDER, SEND-OUT TO REF LAB; TAT 18-24 HRS): SARS-CoV-2, NAA: NOT DETECTED

## 2019-02-05 ENCOUNTER — Ambulatory Visit (HOSPITAL_COMMUNITY): Payer: BC Managed Care – PPO

## 2019-02-05 ENCOUNTER — Encounter (HOSPITAL_BASED_OUTPATIENT_CLINIC_OR_DEPARTMENT_OTHER)
Admission: RE | Disposition: A | Discharge status: Home / Self Care | Payer: Self-pay | Source: Home / Self Care | Attending: Surgery

## 2019-02-05 ENCOUNTER — Ambulatory Visit (HOSPITAL_BASED_OUTPATIENT_CLINIC_OR_DEPARTMENT_OTHER): Payer: BC Managed Care – PPO | Admitting: Anesthesiology

## 2019-02-05 ENCOUNTER — Telehealth: Payer: Self-pay

## 2019-02-05 ENCOUNTER — Ambulatory Visit (HOSPITAL_BASED_OUTPATIENT_CLINIC_OR_DEPARTMENT_OTHER)
Admission: RE | Admit: 2019-02-05 | Discharge: 2019-02-05 | Disposition: A | Discharge status: Home / Self Care | Payer: BC Managed Care – PPO | Attending: Surgery | Admitting: Surgery

## 2019-02-05 ENCOUNTER — Encounter (HOSPITAL_BASED_OUTPATIENT_CLINIC_OR_DEPARTMENT_OTHER): Payer: Self-pay | Admitting: *Deleted

## 2019-02-05 DIAGNOSIS — Z452 Encounter for adjustment and management of vascular access device: Secondary | ICD-10-CM

## 2019-02-05 DIAGNOSIS — C50912 Malignant neoplasm of unspecified site of left female breast: Secondary | ICD-10-CM | POA: Diagnosis not present

## 2019-02-05 DIAGNOSIS — E7800 Pure hypercholesterolemia, unspecified: Secondary | ICD-10-CM | POA: Insufficient documentation

## 2019-02-05 DIAGNOSIS — C773 Secondary and unspecified malignant neoplasm of axilla and upper limb lymph nodes: Secondary | ICD-10-CM | POA: Diagnosis present

## 2019-02-05 DIAGNOSIS — Z95828 Presence of other vascular implants and grafts: Secondary | ICD-10-CM

## 2019-02-05 HISTORY — PX: PORTACATH PLACEMENT: SHX2246

## 2019-02-05 HISTORY — DX: Malignant (primary) neoplasm, unspecified: C80.1

## 2019-02-05 LAB — POCT PREGNANCY, URINE: Preg Test, Ur: NEGATIVE

## 2019-02-05 IMAGING — CR DG CHEST 1V PORT
1 series · 1 of 1 positions shown · non-contrast
Comparison: No prior.

CLINICAL DATA: Right Port-A-Cath.  History of left breast cancer.

EXAM:
PORTABLE CHEST 1 VIEW

[chest ap]
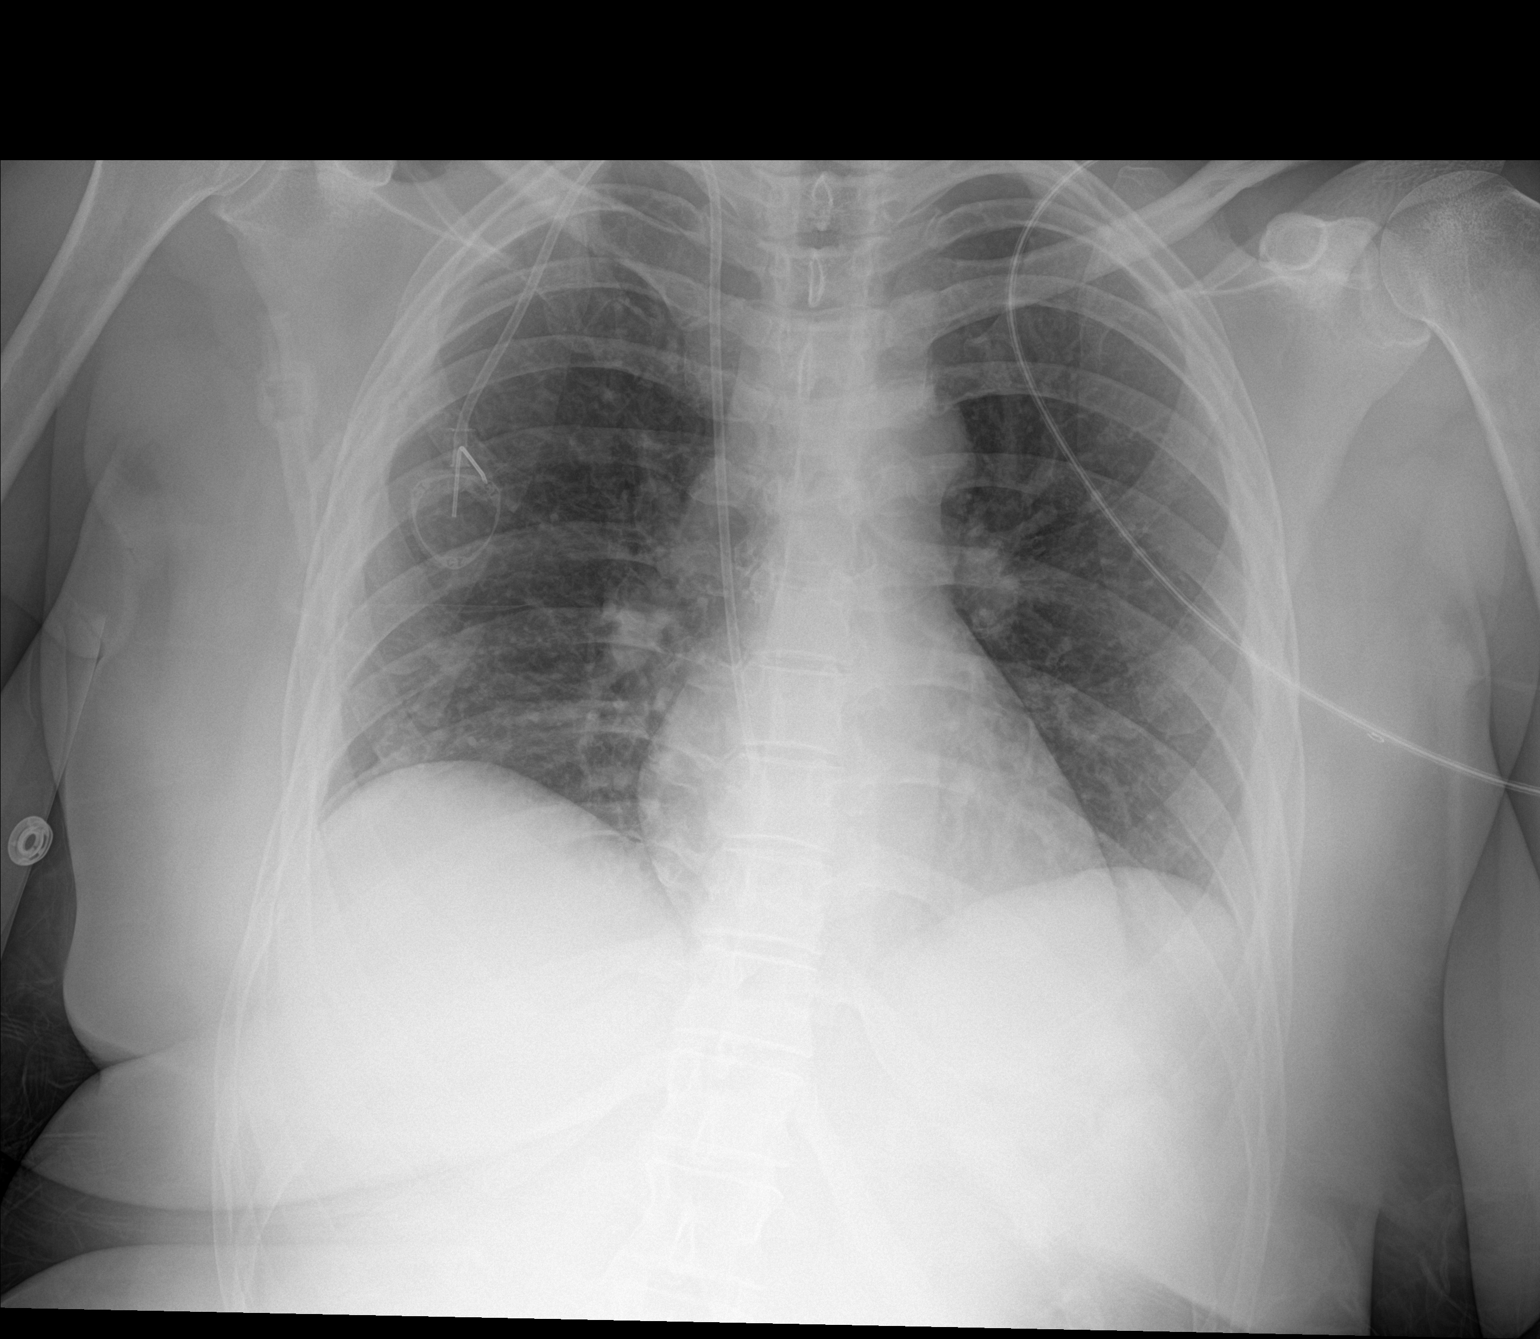

[1 of 1 positions shown; findings below may reference images not displayed]

FINDINGS: Port-A-Cath noted with tip over upper portion of the right atrium.
Heart size normal. No pulmonary venous congestion. No pleural
effusion or pneumothorax. No acute bony abnormality.
IMPRESSION: Port-A-Cath noted with tip over the upper portion right atrium. No
acute cardiopulmonary disease identified. No pneumothorax.

## 2019-02-05 SURGERY — INSERTION, TUNNELED CENTRAL VENOUS DEVICE, WITH PORT
Anesthesia: General | Site: Breast | Laterality: Right

## 2019-02-05 MED ORDER — ACETAMINOPHEN 500 MG PO TABS
ORAL_TABLET | ORAL | Status: AC
  Filled 2019-02-05: qty 2

## 2019-02-05 MED ORDER — FENTANYL CITRATE (PF) 100 MCG/2ML IJ SOLN
INTRAMUSCULAR | Status: AC
  Filled 2019-02-05: qty 2

## 2019-02-05 MED ORDER — HEPARIN SOD (PORK) LOCK FLUSH 100 UNIT/ML IV SOLN
INTRAVENOUS | Status: DC | PRN
  Administered 2019-02-05: 500 [IU] via INTRAVENOUS

## 2019-02-05 MED ORDER — LIDOCAINE 2% (20 MG/ML) 5 ML SYRINGE
INTRAMUSCULAR | Status: AC
  Filled 2019-02-05: qty 5

## 2019-02-05 MED ORDER — PROPOFOL 10 MG/ML IV BOLUS
INTRAVENOUS | Status: DC | PRN
  Administered 2019-02-05: 15 mg via INTRAVENOUS

## 2019-02-05 MED ORDER — FENTANYL CITRATE (PF) 100 MCG/2ML IJ SOLN
50.0000 ug | INTRAMUSCULAR | Status: AC | PRN
  Administered 2019-02-05 (×4): 25 ug via INTRAVENOUS
  Administered 2019-02-05: 100 ug via INTRAVENOUS

## 2019-02-05 MED ORDER — CEFAZOLIN SODIUM-DEXTROSE 2-4 GM/100ML-% IV SOLN
INTRAVENOUS | Status: AC
  Filled 2019-02-05: qty 100

## 2019-02-05 MED ORDER — CHLORHEXIDINE GLUCONATE CLOTH 2 % EX PADS: 6.0000 | MEDICATED_PAD | Freq: Once | CUTANEOUS | Status: DC

## 2019-02-05 MED ORDER — HYDROCODONE-ACETAMINOPHEN 5-325 MG PO TABS: 1.0000 | ORAL_TABLET | Freq: Four times a day (QID) | ORAL | 0 refills | Status: DC | PRN

## 2019-02-05 MED ORDER — FENTANYL CITRATE (PF) 100 MCG/2ML IJ SOLN
25.0000 ug | INTRAMUSCULAR | Status: DC | PRN
  Administered 2019-02-05: 25 ug via INTRAVENOUS

## 2019-02-05 MED ORDER — LIDOCAINE HCL (CARDIAC) PF 100 MG/5ML IV SOSY
PREFILLED_SYRINGE | INTRAVENOUS | Status: DC | PRN
  Administered 2019-02-05: 40 mg via INTRAVENOUS

## 2019-02-05 MED ORDER — GABAPENTIN 300 MG PO CAPS
ORAL_CAPSULE | ORAL | Status: AC
  Filled 2019-02-05: qty 1

## 2019-02-05 MED ORDER — ONDANSETRON HCL 4 MG/2ML IJ SOLN
INTRAMUSCULAR | Status: AC
  Filled 2019-02-05: qty 2

## 2019-02-05 MED ORDER — ACETAMINOPHEN 500 MG PO TABS
1000.0000 mg | ORAL_TABLET | ORAL | Status: AC
  Administered 2019-02-05: 1000 mg via ORAL

## 2019-02-05 MED ORDER — GABAPENTIN 300 MG PO CAPS
300.0000 mg | ORAL_CAPSULE | ORAL | Status: AC
  Administered 2019-02-05: 300 mg via ORAL

## 2019-02-05 MED ORDER — MIDAZOLAM HCL 2 MG/2ML IJ SOLN
1.0000 mg | INTRAMUSCULAR | Status: DC | PRN
  Administered 2019-02-05: 2 mg via INTRAVENOUS

## 2019-02-05 MED ORDER — DEXAMETHASONE SODIUM PHOSPHATE 4 MG/ML IJ SOLN
INTRAMUSCULAR | Status: DC | PRN
  Administered 2019-02-05: 10 mg via INTRAVENOUS

## 2019-02-05 MED ORDER — LACTATED RINGERS IV SOLN
INTRAVENOUS | Status: DC
  Administered 2019-02-05 (×2): via INTRAVENOUS

## 2019-02-05 MED ORDER — BUPIVACAINE-EPINEPHRINE 0.25% -1:200000 IJ SOLN
INTRAMUSCULAR | Status: DC | PRN
  Administered 2019-02-05: 20 mL

## 2019-02-05 MED ORDER — PHENYLEPHRINE 40 MCG/ML (10ML) SYRINGE FOR IV PUSH (FOR BLOOD PRESSURE SUPPORT)
PREFILLED_SYRINGE | INTRAVENOUS | Status: AC
  Filled 2019-02-05: qty 10

## 2019-02-05 MED ORDER — HEPARIN (PORCINE) IN NACL 2-0.9 UNITS/ML
INTRAMUSCULAR | Status: AC | PRN
  Administered 2019-02-05: 500 mL via INTRAVENOUS

## 2019-02-05 MED ORDER — ONDANSETRON HCL 4 MG/2ML IJ SOLN
INTRAMUSCULAR | Status: DC | PRN
  Administered 2019-02-05: 4 mg via INTRAVENOUS

## 2019-02-05 MED ORDER — MIDAZOLAM HCL 2 MG/2ML IJ SOLN
INTRAMUSCULAR | Status: AC
  Filled 2019-02-05: qty 2

## 2019-02-05 MED ORDER — PROPOFOL 10 MG/ML IV BOLUS
INTRAVENOUS | Status: AC
  Filled 2019-02-05: qty 20

## 2019-02-05 MED ORDER — CEFAZOLIN SODIUM-DEXTROSE 2-4 GM/100ML-% IV SOLN
2.0000 g | INTRAVENOUS | Status: AC
  Administered 2019-02-05: 2 g via INTRAVENOUS

## 2019-02-05 MED ORDER — DEXAMETHASONE SODIUM PHOSPHATE 10 MG/ML IJ SOLN
INTRAMUSCULAR | Status: AC
  Filled 2019-02-05: qty 1

## 2019-02-05 SURGICAL SUPPLY — 53 items
APL PRP STRL LF DISP 70% ISPRP (MISCELLANEOUS) ×1
APL SKNCLS STERI-STRIP NONHPOA (GAUZE/BANDAGES/DRESSINGS) ×1
BAG DECANTER FOR FLEXI CONT (MISCELLANEOUS) ×3 IMPLANT
BENZOIN TINCTURE PRP APPL 2/3 (GAUZE/BANDAGES/DRESSINGS) ×2 IMPLANT
BLADE SURG 11 STRL SS (BLADE) ×2 IMPLANT
BLADE SURG 15 STRL LF DISP TIS (BLADE) ×1 IMPLANT
BLADE SURG 15 STRL SS (BLADE) ×2
CANISTER SUCT 1200ML W/VALVE (MISCELLANEOUS) IMPLANT
CHLORAPREP W/TINT 26 (MISCELLANEOUS) ×2 IMPLANT
CLEANER CAUTERY TIP 5X5 PAD (MISCELLANEOUS) ×1 IMPLANT
COVER BACK TABLE REUSABLE LG (DRAPES) ×2 IMPLANT
COVER MAYO STAND REUSABLE (DRAPES) ×2 IMPLANT
COVER PROBE 5X48 (MISCELLANEOUS) ×2
COVER WAND RF STERILE (DRAPES) IMPLANT
DECANTER SPIKE VIAL GLASS SM (MISCELLANEOUS) ×2 IMPLANT
DRAPE C-ARM 42X72 X-RAY (DRAPES) ×2 IMPLANT
DRAPE LAPAROTOMY TRNSV 102X78 (DRAPES) ×2 IMPLANT
DRAPE UTILITY XL STRL (DRAPES) ×2 IMPLANT
DRSG TEGADERM 4X4.75 (GAUZE/BANDAGES/DRESSINGS) ×2 IMPLANT
ELECT REM PT RETURN 9FT ADLT (ELECTROSURGICAL) ×2
ELECTRODE REM PT RTRN 9FT ADLT (ELECTROSURGICAL) ×1 IMPLANT
GAUZE SPONGE 4X4 12PLY STRL LF (GAUZE/BANDAGES/DRESSINGS) IMPLANT
GLOVE BIO SURGEON STRL SZ7 (GLOVE) ×3 IMPLANT
GLOVE BIOGEL PI IND STRL 7.5 (GLOVE) ×1 IMPLANT
GLOVE BIOGEL PI INDICATOR 7.5 (GLOVE) ×2
GOWN STRL REUS W/ TWL LRG LVL3 (GOWN DISPOSABLE) ×1 IMPLANT
GOWN STRL REUS W/TWL LRG LVL3 (GOWN DISPOSABLE) ×4
IV KIT MINILOC 20X1 SAFETY (NEEDLE) IMPLANT
KIT CVR 48X5XPRB PLUP LF (MISCELLANEOUS) IMPLANT
KIT PORT POWER 8FR ISP CVUE (Port) ×1 IMPLANT
NDL HYPO 25X1 1.5 SAFETY (NEEDLE) ×1 IMPLANT
NDL SAFETY ECLIPSE 18X1.5 (NEEDLE) IMPLANT
NDL SPNL 22GX3.5 QUINCKE BK (NEEDLE) IMPLANT
NEEDLE HYPO 18GX1.5 SHARP (NEEDLE)
NEEDLE HYPO 25X1 1.5 SAFETY (NEEDLE) ×2 IMPLANT
NEEDLE SPNL 22GX3.5 QUINCKE BK (NEEDLE) IMPLANT
PACK BASIN DAY SURGERY FS (CUSTOM PROCEDURE TRAY) ×2 IMPLANT
PAD CLEANER CAUTERY TIP 5X5 (MISCELLANEOUS) ×1
PENCIL SMOKE EVACUATOR (MISCELLANEOUS) ×1 IMPLANT
SHEATH COOK PEEL AWAY SET 9F (SHEATH) ×1 IMPLANT
SLEEVE SCD COMPRESS KNEE MED (MISCELLANEOUS) ×2 IMPLANT
SPONGE GAUZE 2X2 8PLY STRL LF (GAUZE/BANDAGES/DRESSINGS) ×2 IMPLANT
STRIP CLOSURE SKIN 1/2X4 (GAUZE/BANDAGES/DRESSINGS) ×2 IMPLANT
SUT MON AB 4-0 PC3 18 (SUTURE) ×2 IMPLANT
SUT PROLENE 2 0 CT2 30 (SUTURE) ×2 IMPLANT
SUT VIC AB 3-0 SH 27 (SUTURE) ×2
SUT VIC AB 3-0 SH 27X BRD (SUTURE) ×1 IMPLANT
SYR 5ML LUER SLIP (SYRINGE) ×2 IMPLANT
SYR BULB IRRIGATION 50ML (SYRINGE) ×1 IMPLANT
SYR CONTROL 10ML LL (SYRINGE) ×2 IMPLANT
TOWEL GREEN STERILE FF (TOWEL DISPOSABLE) ×2 IMPLANT
TUBE CONNECTING 20X1/4 (TUBING) ×1 IMPLANT
YANKAUER SUCT BULB TIP NO VENT (SUCTIONS) ×2 IMPLANT

## 2019-02-05 NOTE — Discharge Instructions (Signed)
PORT-A-CATH: POST OP INSTRUCTIONS  Always review your discharge instruction sheet given to you by the facility where your surgery was performed.   1. A prescription for pain medication may be given to you upon discharge. Take your pain medication as prescribed, if needed. If narcotic pain medicine is not needed, then you make take acetaminophen (Tylenol) or ibuprofen (Advil) as needed.  2. Take your usually prescribed medications unless otherwise directed. 3. If you need a refill on your pain medication, please contact our office. All narcotic pain medicine now requires a paper prescription.  Phoned in and fax refills are no longer allowed by law.  Prescriptions will not be filled after 5 pm or on weekends.  4. You should follow a light diet for the remainder of the day after your procedure. 5. Most patients will experience some mild swelling and/or bruising in the area of the incision. It may take several days to resolve. 6. It is common to experience some constipation if taking pain medication after surgery. Increasing fluid intake and taking a stool softener (such as Colace) will usually help or prevent this problem from occurring. A mild laxative (Milk of Magnesia or Miralax) should be taken according to package directions if there are no bowel movements after 48 hours.  7. Unless discharge instructions indicate otherwise, you may remove your bandages 48 hours after surgery, and you may shower at that time. You may have steri-strips (small white skin tapes) in place directly over the incision.  These strips should be left on the skin for 7-10 days.  If your surgeon used Dermabond (skin glue) on the incision, you may shower in 24 hours.  The glue will flake off over the next 2-3 weeks.  8. If your port is left accessed at the end of surgery (needle left in port), the dressing cannot get wet and should only by changed by a healthcare professional. When the port is no longer accessed (when the  needle has been removed), follow step 7.   9. ACTIVITIES:  Limit activity involving your arms for the next 72 hours. Do no strenuous exercise or activity for 1 week. You may drive when you are no longer taking prescription pain medication, you can comfortably wear a seatbelt, and you can maneuver your car. 10.You may need to see your doctor in the office for a follow-up appointment.  Please       check with your doctor.  11.When you receive a new Port-a-Cath, you will get a product guide and        ID card.  Please keep them in case you need them.  WHEN TO CALL YOUR DOCTOR [phone]): 1. Fever over 101.0 2. Chills 3. Continued bleeding from incision 4. Increased redness and tenderness at the site 5. Shortness of breath, difficulty breathing   The clinic staff is available to answer your questions during regular business hours. Please dont hesitate to call and ask to speak to one of the nurses or medical assistants for clinical concerns. If you have a medical emergency, go to the nearest emergency room or call 911.  A surgeon from [facility] is always on call at the hospital.     For further information, please visit [email or web address]    No Tylenol before 4:00pm   Post Anesthesia Home Care Instructions  Activity: Get plenty of rest for the remainder of the day. A responsible individual must stay with you for 24 hours following the procedure.  For the next  24 hours, DO NOT: -Drive a car -Paediatric nurse -Drink alcoholic beverages -Take any medication unless instructed by your physician -Make any legal decisions or sign important papers.  Meals: Start with liquid foods such as gelatin or soup. Progress to regular foods as tolerated. Avoid greasy, spicy, heavy foods. If nausea and/or vomiting occur, drink only clear liquids until the nausea and/or vomiting subsides. Call your physician if vomiting continues.  Special Instructions/Symptoms: Your  throat may feel dry or sore from the anesthesia or the breathing tube placed in your throat during surgery. If this causes discomfort, gargle with warm salt water. The discomfort should disappear within 24 hours.  If you had a scopolamine patch placed behind your ear for the management of post- operative nausea and/or vomiting:  1. The medication in the patch is effective for 72 hours, after which it should be removed.  Wrap patch in a tissue and discard in the trash. Wash hands thoroughly with soap and water. 2. You may remove the patch earlier than 72 hours if you experience unpleasant side effects which may include dry mouth, dizziness or visual disturbances. 3. Avoid touching the patch. Wash your hands with soap and water after contact with the patch.

## 2019-02-05 NOTE — Brief Op Note (Signed)
02/05/2019  12:59 PM  PATIENT:  Paige Perry  43 y.o. 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   Imogene Burn. Georgette Dover, MD, [facility]  General/ Trauma Surgery   02/05/2019 1:01 PM

## 2019-02-05 NOTE — Telephone Encounter (Signed)
RN left voicemail for patient to call back.   Re: Upcoming appointments 11/11.

## 2019-02-05 NOTE — Anesthesia Postprocedure Evaluation (Signed)
Anesthesia Post Note  Patient: Paige Perry  Procedure(s) Performed: INSERTION PORT-A-CATH WITH ULTRASOUND (Right Breast)     Patient location during evaluation: PACU Anesthesia Type: General Level of consciousness: awake and alert Pain management: pain level controlled Vital Signs Assessment: post-procedure vital signs reviewed and stable Respiratory status: spontaneous breathing, nonlabored ventilation, respiratory function stable and patient connected to nasal cannula oxygen Cardiovascular status: blood pressure returned to baseline and stable Postop Assessment: no apparent nausea or vomiting Anesthetic complications: no    Last Vitals:  Vitals:   02/05/19 1400 02/05/19 1415  BP: 98/67 98/64  Pulse: 76 65  Resp: 16 16  Temp:    SpO2: 99% 99%    Last Pain:  Vitals:   02/05/19 1415  TempSrc:   PainSc: 3                  Damire Remedios L Zeth Buday

## 2019-02-05 NOTE — Anesthesia Procedure Notes (Signed)
Procedure Name: LMA Insertion Date/Time: 02/05/2019 11:35 AM Performed by: Willa Frater, CRNA Pre-anesthesia Checklist: Patient identified, Emergency Drugs available, Suction available and Patient being monitored Patient Re-evaluated:Patient Re-evaluated prior to induction Oxygen Delivery Method: Circle system utilized Preoxygenation: Pre-oxygenation with 100% oxygen Induction Type: IV induction Ventilation: Mask ventilation without difficulty LMA: LMA inserted LMA Size: 4.0 Number of attempts: 1 Airway Equipment and Method: Bite block Placement Confirmation: positive ETCO2 Tube secured with: Tape Dental Injury: Teeth and Oropharynx as per pre-operative assessment

## 2019-02-05 NOTE — Interval H&P Note (Signed)
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  The various methods of treatment have been discussed with the patient and family. After consideration of risks, benefits and other options for treatment, the patient has consented to  Procedure(s): INSERTION PORT-A-CATH WITH ULTRASOUND (N/A) as a surgical intervention.  The patient's history has been reviewed, patient examined, no change in status, stable for surgery.  I have reviewed the patient's chart and labs.  Questions were answered to the patient's satisfaction.     Maia Petties

## 2019-02-05 NOTE — Op Note (Signed)
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 Surgeon:  Maia Petties Anesthesia:  GEN-LMA Indications:This is a 43 year old female in good health who presents with a recent discovery of a palpable mass in her left breast. This is been present for about a month. She underwent diagnostic mammogram and ultrasound on 01/14/19. Showed a 2.3 cm mass in the left breast at 12:00. She also had a single enlarged left axillary lymph node measuring 3.5 cm. She underwent biopsy of the mass as well as the axillary lymph node. 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 She is scheduled to begin chemotherapy tomorrow.  Description of Procedure:  She was brought to the OR and positioned in a supine position with a roll behind her shoulders.  After an adequate level of anesthesia was obtained, her right neck and chest were prepped with Chloraprep and draped in sterile fashion.  I examined her neck with the ultrasound.  The R internal jugular vein was identified.  Under direct US guidance, the IJ was cannulated with the 18 gauge needle with good blood return.  However, the wire did not pass easily.  We withdrew the needle and repositioned the US probe more distally in the neck.  We were able to cannulate, withdraw blood, and pass the wire down into the SVC.  This was observed with fluoroscopic guidance.  The needle was removed.  We then created a small subcutaneous pocket below the right clavicle.  We encountered a small, non-infected sebaceous cyst which was excised.  We irrigated the pocket with antibiotic solution and soaked the port in antibiotic solution.  We then created a subcutaneous tunnel from the pocket to the insertion site on the neck, passing the catheter through.  We cut the catheter to the  appropriate length.  The dilator and breakaway sheath were then passed over the wire under fluoro guidance.  The dilator and wire were removed.  The catheter was passed through the sheath, which was removed.  We aspirated blood easily and flushed easily.  The port was secured with 2-0 Prolene sutures.  The wounds were closed with 3-0 Vicryl and 4-0 Monocryl.  Steri-strips were applied.  The port was accessed.  Concentrated heparin solution was instilled into the port.  The port was left accessed for chemo tomorrow.  An occlusive dressing was placed.  She was extubated and brought to the PACU in good condition.  CXR shows no pneumothorax with the tip of the catheter at the upper right atrium.    Imogene Burn. Georgette Dover, MD, [facility]  General/ Trauma Surgery   02/05/2019 4:20 PM

## 2019-02-05 NOTE — Transfer of Care (Signed)
Immediate Anesthesia Transfer of Care Note  Patient: KEYIANA NAPORA  Procedure(s) Performed: INSERTION PORT-A-CATH WITH ULTRASOUND (N/A Breast)  Patient Location: PACU  Anesthesia Type:General  Level of Consciousness: drowsy  Airway & Oxygen Therapy: Patient Spontanous Breathing and Patient connected to face mask oxygen  Post-op Assessment: Report given to RN and Post -op Vital signs reviewed and stable  Post vital signs: Reviewed and stable  Last Vitals:  Vitals Value Taken Time  BP 109/76 02/05/19 1307  Temp    Pulse 93 02/05/19 1310  Resp 19 02/05/19 1310  SpO2 100 % 02/05/19 1310  Vitals shown include unvalidated device data.  Last Pain:  Vitals:   02/05/19 1007  TempSrc: Oral  PainSc: 0-No pain         Complications: No apparent anesthesia complications

## 2019-02-05 NOTE — Anesthesia Preprocedure Evaluation (Addendum)
Anesthesia Evaluation  Patient identified by MRN, date of birth, ID band Patient awake    Reviewed: Allergy & Precautions, NPO status , Patient's Chart, lab work & pertinent test results  Airway Mallampati: I  TM Distance: >3 FB Neck ROM: Full    Dental no notable dental hx. (+) Teeth Intact, Dental Advisory Given   Pulmonary asthma ,    Pulmonary exam normal breath sounds clear to auscultation       Cardiovascular Normal cardiovascular exam Rhythm:Regular Rate:Normal  HLD  TTE 09/2018 Normal LVEF, no significant valvular abnormalities   Neuro/Psych PSYCHIATRIC DISORDERS Anxiety negative neurological ROS     GI/Hepatic Neg liver ROS, GERD  Medicated and Controlled,  Endo/Other  negative endocrine ROS  Renal/GU negative Renal ROS  negative genitourinary   Musculoskeletal negative musculoskeletal ROS (+)   Abdominal   Peds  Hematology negative hematology ROS (+)   Anesthesia Other Findings   Reproductive/Obstetrics                            Anesthesia Physical Anesthesia Plan  ASA: II  Anesthesia Plan: General   Post-op Pain Management:    Induction: Intravenous  PONV Risk Score and Plan: 3 and Ondansetron, Dexamethasone and Midazolam  Airway Management Planned: LMA  Additional Equipment:   Intra-op Plan:   Post-operative Plan: Extubation in OR  Informed Consent: I have reviewed the patients History and Physical, chart, labs and discussed the procedure including the risks, benefits and alternatives for the proposed anesthesia with the patient or authorized representative who has indicated his/her understanding and acceptance.     Dental advisory given  Plan Discussed with: CRNA  Anesthesia Plan Comments:         Anesthesia Quick Evaluation

## 2019-02-06 ENCOUNTER — Telehealth: Payer: Self-pay | Admitting: Hematology and Oncology

## 2019-02-06 ENCOUNTER — Inpatient Hospital Stay: Payer: BC Managed Care – PPO | Admitting: Hematology and Oncology

## 2019-02-06 ENCOUNTER — Inpatient Hospital Stay: Payer: BC Managed Care – PPO

## 2019-02-06 ENCOUNTER — Telehealth: Payer: Self-pay

## 2019-02-06 ENCOUNTER — Encounter (HOSPITAL_BASED_OUTPATIENT_CLINIC_OR_DEPARTMENT_OTHER): Payer: Self-pay | Admitting: Surgery

## 2019-02-06 NOTE — Telephone Encounter (Signed)
Called pt per 11/10 sch message - unable to reach pt . Left message with appt date and time

## 2019-02-06 NOTE — Telephone Encounter (Signed)
Nutrition Assessment  Reason for Assessment:  Pt attended Breast Clinic on 10/28 and was given nutrition packet by nurse navigator.    ASSESSMENT:  43 year old female with new diagnosis of left breast cancer.  Planning neoadjuvant chemotherapy, lumpectomy, adjuvant radiation and antiestrogens.   Spoke with patient via phone to introduce self and service at [facility].  Patient reports that appetite is not that good today due to having port placed yesterday but typically normal appetite.   Medications:  reviewed  Labs: reviewed  Anthropometrics:   Height: 63 inchse Weight: 179 lb  BMI: 31   NUTRITION DIAGNOSIS: Food and nutrition related knowledge deficit related to new diagnosis of breast cancer as evidenced by no prior need for nutrition related information.  INTERVENTION:   Discussed briefly packet of information regarding nutritional tips for breast cancer patients.  No questions at this time. Contact information provided and patient knows to contact me with questions/concerns.    MONITORING, EVALUATION, and GOAL: Pt will consume a healthy plant based diet to maintain lean body mass throughout treatment.   [place] Arabie B. Zenia Resides, [place], [place] Registered Dietitian [phone] (pager)

## 2019-02-08 ENCOUNTER — Ambulatory Visit: Payer: BC Managed Care – PPO

## 2019-02-11 ENCOUNTER — Telehealth: Payer: Self-pay | Admitting: *Deleted

## 2019-02-11 NOTE — Telephone Encounter (Signed)
Received message from Paige [facility] with PA on pt treatment.  Insurance has approved chemo treatment.  RN placed call to pt and LVM informing pt treatment has been approved and her apt is on Wednesday 02/13/2019 starting at 8am.

## 2019-02-12 ENCOUNTER — Other Ambulatory Visit: Payer: BC Managed Care – PPO

## 2019-02-12 ENCOUNTER — Ambulatory Visit: Payer: BC Managed Care – PPO

## 2019-02-12 ENCOUNTER — Ambulatory Visit: Payer: BC Managed Care – PPO | Admitting: Hematology and Oncology

## 2019-02-12 NOTE — Progress Notes (Signed)
Patient Care Team: [name], NP as PCP - General Mauro Kaufmann, RN as Oncology Nurse Navigator Rockwell Germany, RN as Oncology Nurse Navigator Donnie Mesa, MD as Consulting Physician (General Surgery) Nicholas Lose, MD as Consulting Physician (Hematology and Oncology) Kyung Rudd, MD as Consulting Physician (Radiation Oncology)  DIAGNOSIS:    ICD-10-CM   1. Malignant neoplasm of upper-outer quadrant of left breast in female, estrogen receptor positive ([place])  C50.412    Z17.0     SUMMARY OF ONCOLOGIC HISTORY: Oncology History  Malignant neoplasm of upper-outer quadrant of left breast in female, estrogen receptor positive ([place])  01/18/2019 Initial Diagnosis   Patient palpated a left breast lump for 2 weeks. Mammogram showed a 2.3cm left breast mass at the 12 o'clock position, with 1 enlarged left axillary lymph node measuring 3.5cm. 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   01/23/2019 Cancer Staging   Staging form: Breast, AJCC 8th Edition - Clinical stage from 01/23/2019: Stage IIA (cT2, cN1(f), cM0, G2, ER+, PR-, HER2+) - Signed by Nicholas Lose, MD on 01/23/2019   02/13/2019 -  Chemotherapy   The patient had palonosetron (ALOXI) injection 0.25 mg, 0.25 mg, Intravenous,  Once, 0 of 6 cycles pegfilgrastim-jmdb (FULPHILA) injection 6 mg, 6 mg, Subcutaneous,  Once, 0 of 6 cycles CARBOplatin (PARAPLATIN) 700 mg in sodium chloride 0.9 % 250 mL chemo infusion, 700 mg (100 % of original dose 700 mg), Intravenous,  Once, 0 of 6 cycles Dose modification: 700 mg (original dose 700 mg, Cycle 1) DOCEtaxel (TAXOTERE) 140 mg in sodium chloride 0.9 % 250 mL chemo infusion, 75 mg/m2 = 140 mg, Intravenous,  Once, 0 of 6 cycles pertuzumab (PERJETA) 420 mg in sodium chloride 0.9 % 250 mL chemo infusion, 420 mg (100 % of original dose 420 mg), Intravenous, Once, 0 of 6 cycles Dose modification: 420 mg (original dose 420 mg, Cycle 1, Reason: Provider  Judgment) fosaprepitant (EMEND) 150 mg, dexamethasone (DECADRON) 12 mg in sodium chloride 0.9 % 145 mL IVPB, , Intravenous,  Once, 0 of 6 cycles trastuzumab-dkst (OGIVRI) 651 mg in sodium chloride 0.9 % 250 mL chemo infusion, 8 mg/kg = 651 mg, Intravenous,  Once, 0 of 6 cycles  for chemotherapy treatment.     Genetic Testing   Negative genetic testing. No pathogenic variants identified on the [facility] Common Hereditary Cancers Panel. The Common Hereditary Cancers Panel offered by [facility] includes sequencing and/or deletion duplication testing of the following 48 genes: APC, ATM, AXIN2, BARD1, BMPR1A, BRCA1, BRCA2, BRIP1, CDH1, CDKN2A (p14ARF), CDKN2A (p16INK4a), CKD4, CHEK2, CTNNA1, DICER1, EPCAM (Deletion/duplication testing only), GREM1 (promoter region deletion/duplication testing only), KIT, MEN1, MLH1, MSH2, MSH3, MSH6, MUTYH, NBN, NF1, NHTL1, PALB2, PDGFRA, PMS2, POLD1, POLE, PTEN, RAD50, RAD51C, RAD51D, RNF43, SDHB, SDHC, SDHD, SMAD4, SMARCA4. STK11, TP53, TSC1, TSC2, and VHL.  The following genes were evaluated for sequence changes only: SDHA and HOXB13 c.251G>A variant only. The report date is 02/01/2019.      CHIEF COMPLIANT: Cycle 1 TCH Perjeta  INTERVAL HISTORY: Paige Perry is a 43 y.o. with above-mentioned history of left breast cancer. She is currently undergoing neoadjuvant chemotherapy with [place] Perjeta. Echo on 01/29/19 showed an ejection fraction of 60-65%. Breast MRI on 01/29/19 showed the known malignancy in the left breast measuring 2.3cm and two abnormal left axillary lymph nodes. Her port was placed by Dr. Georgette Dover on 02/05/19. She presents to the clinic today for cycle 1.   REVIEW OF SYSTEMS:   Constitutional: Denies  fevers, chills or abnormal weight loss Eyes: Denies blurriness of vision Ears, nose, mouth, throat, and face: Denies mucositis or sore throat Respiratory: Denies cough, dyspnea or wheezes Cardiovascular: Denies palpitation, chest discomfort Gastrointestinal: Denies  nausea, heartburn or change in bowel habits Skin: Denies abnormal skin rashes Lymphatics: Denies new lymphadenopathy or easy bruising Neurological: Denies numbness, tingling or new weaknesses Behavioral/Psych: Mood is stable, no new changes  Extremities: No lower extremity edema Breast: denies any pain or lumps or nodules in either breasts All other systems were reviewed with the patient and are negative.  I have reviewed the past medical history, past surgical history, social history and family history with the patient and they are unchanged from previous note.  ALLERGIES:  is allergic to other.  MEDICATIONS:  Current Outpatient Medications  Medication Sig Dispense Refill  . cetirizine (ZYRTEC) 10 MG tablet Take 10 mg by mouth as needed.     Marland Kitchen dexamethasone (DECADRON) 4 MG tablet Take 1 tablet (4 mg total) by mouth daily. Take 1 tablet day before chemo and 1 tablet day after chemo with food 12 tablet 0  . Etonogestrel-Ethinyl Estradiol (NUVARING VA) Place vaginally. Stays in for three weeks then take it out for one week.    Marland Kitchen HYDROcodone-acetaminophen (NORCO/VICODIN) 5-325 MG tablet Take 1 tablet by mouth every 6 (six) hours as needed for moderate pain. 15 tablet 0  . ibuprofen (ADVIL,MOTRIN) 400 MG tablet Take 1 tablet (400 mg total) by mouth every 6 (six) hours as needed for pain. 30 tablet 0  . lidocaine-prilocaine (EMLA) cream Apply to affected area once 30 g 3  . LORazepam (ATIVAN) 0.5 MG tablet Take 1 tablet (0.5 mg total) by mouth at bedtime as needed (Nausea or vomiting). 30 tablet 0  . nabumetone (RELAFEN) 500 MG tablet Take 1,000 mg by mouth 2 (two) times daily.    . ondansetron (ZOFRAN) 8 MG tablet Take 1 tablet (8 mg total) by mouth 2 (two) times daily as needed (Nausea or vomiting). Begin 4 days after chemotherapy. 30 tablet 1  . prochlorperazine (COMPAZINE) 10 MG tablet Take 1 tablet (10 mg total) by mouth every 6 (six) hours as needed (Nausea or vomiting). 30 tablet 1  .  rosuvastatin (CRESTOR) 20 MG tablet Take 20 mg by mouth daily.     No current facility-administered medications for this visit.     PHYSICAL EXAMINATION: ECOG PERFORMANCE STATUS: 1 - Symptomatic but completely ambulatory  Vitals:   02/13/19 0833  BP: 131/77  Pulse: (!) 105  Resp: 18  Temp: 99.1 F (37.3 C)  SpO2: 100%   Filed Weights   02/13/19 0833  Weight: 180 lb 8 oz (81.9 kg)    GENERAL: alert, no distress and comfortable SKIN: skin color, texture, turgor are normal, no rashes or significant lesions EYES: normal, Conjunctiva are pink and non-injected, sclera clear OROPHARYNX: no exudate, no erythema and lips, buccal mucosa, and tongue normal  NECK: supple, thyroid normal size, non-tender, without nodularity LYMPH: no palpable lymphadenopathy in the cervical, axillary or inguinal LUNGS: clear to auscultation and percussion with normal breathing effort HEART: regular rate & rhythm and no murmurs and no lower extremity edema ABDOMEN: abdomen soft, non-tender and normal bowel sounds MUSCULOSKELETAL: no cyanosis of digits and no clubbing  NEURO: alert & oriented x 3 with fluent speech, no focal motor/sensory deficits EXTREMITIES: No lower extremity edema  LABORATORY DATA:  I have reviewed the data as listed CMP Latest Ref Rng & Units 01/23/2019 01/09/2012 03/31/2006  Glucose  70 - 99 mg/dL 85 75 87  BUN 6 - 20 mg/dL 9 10 -  Creatinine 0.44 - 1.00 mg/dL 0.80 0.90 -  Sodium 135 - 145 mmol/L 140 138 -  Potassium 3.5 - 5.1 mmol/L 3.9 3.8 -  Chloride 98 - 111 mmol/L 105 100 -  CO2 22 - 32 mmol/L 25 24 -  Calcium 8.9 - 10.3 mg/dL 9.6 9.6 -  Total Protein 6.5 - 8.1 g/dL 7.7 - -  Total Bilirubin 0.3 - 1.2 mg/dL 0.3 - -  Alkaline Phos 38 - 126 U/L 73 - -  AST 15 - 41 U/L 14(L) - -  ALT 0 - 44 U/L 16 - -    Lab Results  Component Value Date   WBC 9.2 02/13/2019   HGB 13.0 02/13/2019   HCT 39.2 02/13/2019   MCV 90.1 02/13/2019   PLT 277 02/13/2019   NEUTROABS 8.0 (H)  02/13/2019    ASSESSMENT & PLAN:  Malignant neoplasm of upper-outer quadrant of left breast in female, estrogen receptor positive ([place]) 01/18/2019: Patient palpated a left breast lump for 2 weeks. Mammogram showed a 2.3cm left breast mass at the 12 o'clock position, with 1 enlarged left axillary lymph node measuring 3.5cm. 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 Neoadjuvant chemotherapy with TCH Perjeta 6 cycles followed by Herceptin maintenance for 1 year 2. Followed by breast conserving surgery if possible with sentinel lymph node study 3. Followed by adjuvant radiation therapy if patient had lumpectomy No adverse effects from participation in the neuropathy clinical trial SWOG 1714 --------------------------------------------------------------------------------------------------------------------------------------- Current treatment: Cycle 1 day 1 [place] Perjeta Echocardiogram 01/29/2019: EF 60 to 65% Labs reviewed, chemo education completed, chemo consent obtained Return to clinic in 1 week for toxicity check  No orders of the defined types were placed in this encounter.  The patient has a good understanding of the overall plan. she agrees with it. she will call with any problems that may develop before the next visit here.  Nicholas Lose, MD 02/13/2019  Julious Oka Dorshimer, am acting as scribe for Dr. Nicholas Lose.  I have reviewed the above documentation for accuracy and completeness, and I agree with the above.

## 2019-02-13 ENCOUNTER — Inpatient Hospital Stay: Payer: BC Managed Care – PPO

## 2019-02-13 ENCOUNTER — Other Ambulatory Visit: Payer: Self-pay

## 2019-02-13 ENCOUNTER — Encounter: Payer: Self-pay | Admitting: *Deleted

## 2019-02-13 ENCOUNTER — Inpatient Hospital Stay (HOSPITAL_BASED_OUTPATIENT_CLINIC_OR_DEPARTMENT_OTHER): Payer: BC Managed Care – PPO | Admitting: Hematology and Oncology

## 2019-02-13 VITALS — BP 111/84 | HR 88 | Temp 98.9°F | Resp 16

## 2019-02-13 VITALS — BP 136/81 | HR 109 | Temp 98.6°F | Resp 17

## 2019-02-13 DIAGNOSIS — Z79899 Other long term (current) drug therapy: Secondary | ICD-10-CM | POA: Diagnosis not present

## 2019-02-13 DIAGNOSIS — Z5111 Encounter for antineoplastic chemotherapy: Secondary | ICD-10-CM | POA: Diagnosis present

## 2019-02-13 DIAGNOSIS — C50412 Malignant neoplasm of upper-outer quadrant of left female breast: Secondary | ICD-10-CM | POA: Diagnosis not present

## 2019-02-13 DIAGNOSIS — Z793 Long term (current) use of hormonal contraceptives: Secondary | ICD-10-CM | POA: Diagnosis not present

## 2019-02-13 DIAGNOSIS — R197 Diarrhea, unspecified: Secondary | ICD-10-CM | POA: Diagnosis not present

## 2019-02-13 DIAGNOSIS — Z5189 Encounter for other specified aftercare: Secondary | ICD-10-CM | POA: Diagnosis present

## 2019-02-13 DIAGNOSIS — Z5112 Encounter for antineoplastic immunotherapy: Secondary | ICD-10-CM | POA: Diagnosis present

## 2019-02-13 DIAGNOSIS — Z170 Estrogen receptor positive status [ER+]: Secondary | ICD-10-CM

## 2019-02-13 DIAGNOSIS — Z791 Long term (current) use of non-steroidal anti-inflammatories (NSAID): Secondary | ICD-10-CM | POA: Diagnosis not present

## 2019-02-13 DIAGNOSIS — J029 Acute pharyngitis, unspecified: Secondary | ICD-10-CM | POA: Diagnosis not present

## 2019-02-13 DIAGNOSIS — K219 Gastro-esophageal reflux disease without esophagitis: Secondary | ICD-10-CM | POA: Diagnosis not present

## 2019-02-13 DIAGNOSIS — E785 Hyperlipidemia, unspecified: Secondary | ICD-10-CM | POA: Diagnosis not present

## 2019-02-13 LAB — CBC WITH DIFFERENTIAL (CANCER CENTER ONLY)
Abs Immature Granulocytes: 0.02 10*3/uL (ref 0.00–0.07)
Basophils Absolute: 0 10*3/uL (ref 0.0–0.1)
Basophils Relative: 0 %
Eosinophils Absolute: 0 10*3/uL (ref 0.0–0.5)
Eosinophils Relative: 0 %
HCT: 39.2 % (ref 36.0–46.0)
Hemoglobin: 13 g/dL (ref 12.0–15.0)
Immature Granulocytes: 0 %
Lymphocytes Relative: 11 %
Lymphs Abs: 1.1 10*3/uL (ref 0.7–4.0)
MCH: 29.9 pg (ref 26.0–34.0)
MCHC: 33.2 g/dL (ref 30.0–36.0)
MCV: 90.1 fL (ref 80.0–100.0)
Monocytes Absolute: 0.2 10*3/uL (ref 0.1–1.0)
Monocytes Relative: 2 %
Neutro Abs: 8 10*3/uL — ABNORMAL HIGH (ref 1.7–7.7)
Neutrophils Relative %: 87 %
Platelet Count: 277 10*3/uL (ref 150–400)
RBC: 4.35 MIL/uL (ref 3.87–5.11)
RDW: 12 % (ref 11.5–15.5)
WBC Count: 9.2 10*3/uL (ref 4.0–10.5)
nRBC: 0 % (ref 0.0–0.2)

## 2019-02-13 LAB — CMP (CANCER CENTER ONLY)
ALT: 18 U/L (ref 0–44)
AST: 15 U/L (ref 15–41)
Albumin: 3.4 g/dL — ABNORMAL LOW (ref 3.5–5.0)
Alkaline Phosphatase: 81 U/L (ref 38–126)
Anion gap: 12 (ref 5–15)
BUN: 11 mg/dL (ref 6–20)
CO2: 21 mmol/L — ABNORMAL LOW (ref 22–32)
Calcium: 9.4 mg/dL (ref 8.9–10.3)
Chloride: 103 mmol/L (ref 98–111)
Creatinine: 0.82 mg/dL (ref 0.44–1.00)
GFR, Est AFR Am: >60 mL/min (ref >60)
GFR, Estimated: >60 mL/min (ref >60)
Glucose, Bld: 123 mg/dL — ABNORMAL HIGH (ref 70–99)
Potassium: 4.1 mmol/L (ref 3.5–5.1)
Sodium: 136 mmol/L (ref 135–145)
Total Bilirubin: 0.4 mg/dL (ref 0.3–1.2)
Total Protein: 8.1 g/dL (ref 6.5–8.1)

## 2019-02-13 MED ORDER — SODIUM CHLORIDE 0.9 % IV SOLN
Freq: Once | INTRAVENOUS | Status: AC
  Administered 2019-02-13: 15:00:00 via INTRAVENOUS
  Filled 2019-02-13: qty 5

## 2019-02-13 MED ORDER — SODIUM CHLORIDE 0.9% FLUSH
10.0000 mL | INTRAVENOUS | Status: DC | PRN
  Administered 2019-02-13: 18:00:00 10 mL
  Filled 2019-02-13: qty 10

## 2019-02-13 MED ORDER — DIPHENHYDRAMINE HCL 25 MG PO CAPS
ORAL_CAPSULE | ORAL | Status: AC
  Filled 2019-02-13: qty 2

## 2019-02-13 MED ORDER — SODIUM CHLORIDE 0.9 % IV SOLN
75.0000 mg/m2 | Freq: Once | INTRAVENOUS | Status: AC
  Administered 2019-02-13: 140 mg via INTRAVENOUS
  Filled 2019-02-13: qty 14

## 2019-02-13 MED ORDER — PALONOSETRON HCL INJECTION 0.25 MG/5ML
0.2500 mg | Freq: Once | INTRAVENOUS | Status: AC
  Administered 2019-02-13: 15:00:00 0.25 mg via INTRAVENOUS

## 2019-02-13 MED ORDER — ACETAMINOPHEN 325 MG PO TABS
650.0000 mg | ORAL_TABLET | Freq: Once | ORAL | Status: AC
  Administered 2019-02-13: 09:00:00 650 mg via ORAL

## 2019-02-13 MED ORDER — SODIUM CHLORIDE 0.9% FLUSH
10.0000 mL | INTRAVENOUS | Status: DC | PRN
  Administered 2019-02-13: 10 mL via INTRAVENOUS
  Filled 2019-02-13: qty 10

## 2019-02-13 MED ORDER — SODIUM CHLORIDE 0.9 % IV SOLN
700.0000 mg | Freq: Once | INTRAVENOUS | Status: AC
  Administered 2019-02-13: 17:00:00 700 mg via INTRAVENOUS
  Filled 2019-02-13: qty 70

## 2019-02-13 MED ORDER — DIPHENHYDRAMINE HCL 25 MG PO CAPS
50.0000 mg | ORAL_CAPSULE | Freq: Once | ORAL | Status: AC
  Administered 2019-02-13: 50 mg via ORAL

## 2019-02-13 MED ORDER — ACETAMINOPHEN 325 MG PO TABS
ORAL_TABLET | ORAL | Status: AC
  Filled 2019-02-13: qty 2

## 2019-02-13 MED ORDER — SODIUM CHLORIDE 0.9 % IV SOLN
Freq: Once | INTRAVENOUS | Status: AC
  Administered 2019-02-13: 09:00:00 via INTRAVENOUS
  Filled 2019-02-13: qty 250

## 2019-02-13 MED ORDER — TRASTUZUMAB-DKST CHEMO 150 MG IV SOLR
8.0000 mg/kg | Freq: Once | INTRAVENOUS | Status: AC
  Administered 2019-02-13: 11:00:00 651 mg via INTRAVENOUS
  Filled 2019-02-13: qty 31

## 2019-02-13 MED ORDER — HEPARIN SOD (PORK) LOCK FLUSH 100 UNIT/ML IV SOLN
500.0000 [IU] | Freq: Once | INTRAVENOUS | Status: AC | PRN
  Administered 2019-02-13: 18:00:00 500 [IU]
  Filled 2019-02-13: qty 5

## 2019-02-13 MED ORDER — PALONOSETRON HCL INJECTION 0.25 MG/5ML
INTRAVENOUS | Status: AC
  Filled 2019-02-13: qty 5

## 2019-02-13 MED ORDER — SODIUM CHLORIDE 0.9 % IV SOLN
420.0000 mg | Freq: Once | INTRAVENOUS | Status: AC
  Administered 2019-02-13: 420 mg via INTRAVENOUS
  Filled 2019-02-13: qty 14

## 2019-02-13 NOTE — Research (Signed)
02/13/2019 Research - DCP-001 consent visit Patient into clinic today accompanied by her sister for first treatment visit. Patient states that she read the DCP-001 "Use of a Clinical Trial Screening Tool to Address Cancer Health Disparities in the [facility]" consent and authorization forms in their entirety and she agrees to participate in the research study. The consent and authorization forms were reviewed with the patient in their entirety. The patient had no additional questions, and she agreed to participate. The consent and authorization forms were signed and dated by the patient. Copy of the signed consent and authorization forms were given to the patient today. Patient meets inclusion criteria for the DCP-001 study. Will proceed with study enrollment within 4 weeks of screen failure for the S1714 research study, per protocol requirements. Thanked patient for her willingness to participate in this study. Cindy S. Brigitte Pulse BSN, RN, CCRP 02/13/2019 11:04 AM

## 2019-02-13 NOTE — Progress Notes (Signed)
Per Dr. Gudena, okay to treat with HR 110 

## 2019-02-13 NOTE — Patient Instructions (Addendum)
[facility] Discharge Instructions for Patients Receiving Chemotherapy  Today you received the following chemotherapy agents: Trastuzumab/Pertuzumab/Docetaxel/Carboplatin  To help prevent nausea and vomiting after your treatment, we encourage you to take your nausea medication as directed.   If you develop nausea and vomiting that is not controlled by your nausea medication, call the clinic.   BELOW ARE SYMPTOMS THAT SHOULD BE REPORTED IMMEDIATELY:  *FEVER GREATER THAN 100.5 F  *CHILLS WITH OR WITHOUT FEVER  NAUSEA AND VOMITING THAT IS NOT CONTROLLED WITH YOUR NAUSEA MEDICATION  *UNUSUAL SHORTNESS OF BREATH  *UNUSUAL BRUISING OR BLEEDING  TENDERNESS IN MOUTH AND THROAT WITH OR WITHOUT PRESENCE OF ULCERS  *URINARY PROBLEMS  *BOWEL PROBLEMS  UNUSUAL RASH Items with * indicate a potential emergency and should be followed up as soon as possible.  Feel free to call the clinic should you have any questions or concerns. The clinic phone number is [phone].  Please show the [facility] at check-in to the Emergency Department and triage nurse.  Trastuzumab injection for infusion What is this medicine? TRASTUZUMAB (tras TOO zoo mab) is a monoclonal antibody. It is used to treat breast cancer and stomach cancer. This medicine may be used for other purposes; ask your health care provider or pharmacist if you have questions. COMMON BRAND NAME(S): Herceptin, Galvin Proffer, Trazimera What should I tell my health care provider before I take this medicine? They need to know if you have any of these conditions:  heart disease  heart failure  lung or breathing disease, like asthma  an unusual or allergic reaction to trastuzumab, benzyl alcohol, or other medications, foods, dyes, or preservatives  pregnant or trying to get pregnant  breast-feeding How should I use this medicine? This drug is given as an infusion into a vein. It is  administered in a hospital or clinic by a specially trained health care professional. Talk to your pediatrician regarding the use of this medicine in children. This medicine is not approved for use in children. Overdosage: If you think you have taken too much of this medicine contact a poison control center or emergency room at once. NOTE: This medicine is only for you. Do not share this medicine with others. What if I miss a dose? It is important not to miss a dose. Call your doctor or health care professional if you are unable to keep an appointment. What may interact with this medicine? This medicine may interact with the following medications:  certain types of chemotherapy, such as daunorubicin, doxorubicin, epirubicin, and idarubicin This list may not describe all possible interactions. Give your health care provider a list of all the medicines, herbs, non-prescription drugs, or dietary supplements you use. Also tell them if you smoke, drink alcohol, or use illegal drugs. Some items may interact with your medicine. What should I watch for while using this medicine? Visit your doctor for checks on your progress. Report any side effects. Continue your course of treatment even though you feel ill unless your doctor tells you to stop. Call your doctor or health care professional for advice if you get a fever, chills or sore throat, or other symptoms of a cold or flu. Do not treat yourself. Try to avoid being around people who are sick. You may experience fever, chills and shaking during your first infusion. These effects are usually mild and can be treated with other medicines. Report any side effects during the infusion to your health care professional. Fever and chills  usually do not happen with later infusions. Do not become pregnant while taking this medicine or for 7 months after stopping it. Women should inform their doctor if they wish to become pregnant or think they might be pregnant. Women  of child-bearing potential will need to have a negative pregnancy test before starting this medicine. There is a potential for serious side effects to an unborn child. Talk to your health care professional or pharmacist for more information. Do not breast-feed an infant while taking this medicine or for 7 months after stopping it. Women must use effective birth control with this medicine. What side effects may I notice from receiving this medicine? Side effects that you should report to your doctor or health care professional as soon as possible:  allergic reactions like skin rash, itching or hives, swelling of the face, lips, or tongue  chest pain or palpitations  cough  dizziness  feeling faint or lightheaded, falls  fever  general ill feeling or flu-like symptoms  signs of worsening heart failure like breathing problems; swelling in your legs and feet  unusually weak or tired Side effects that usually do not require medical attention (report to your doctor or health care professional if they continue or are bothersome):  bone pain  changes in taste  diarrhea  joint pain  nausea/vomiting  weight loss This list may not describe all possible side effects. Call your doctor for medical advice about side effects. You may report side effects to FDA at 1-800-FDA-1088. Where should I keep my medicine? This drug is given in a hospital or clinic and will not be stored at home. NOTE: This sheet is a summary. It may not cover all possible information. If you have questions about this medicine, talk to your doctor, pharmacist, or health care provider.  2020 Elsevier/Gold Standard (2016-03-08 14:37:52)  Pertuzumab injection What is this medicine? PERTUZUMAB (per TOOZ ue mab) is a monoclonal antibody. It is used to treat breast cancer. This medicine may be used for other purposes; ask your health care provider or pharmacist if you have questions. COMMON BRAND NAME(S): PERJETA What  should I tell my health care provider before I take this medicine? They need to know if you have any of these conditions:  heart disease  heart failure  high blood pressure  history of irregular heart beat  recent or ongoing radiation therapy  an unusual or allergic reaction to pertuzumab, other medicines, foods, dyes, or preservatives  pregnant or trying to get pregnant  breast-feeding How should I use this medicine? This medicine is for infusion into a vein. It is given by a health care professional in a hospital or clinic setting. Talk to your pediatrician regarding the use of this medicine in children. Special care may be needed. Overdosage: If you think you have taken too much of this medicine contact a poison control center or emergency room at once. NOTE: This medicine is only for you. Do not share this medicine with others. What if I miss a dose? It is important not to miss your dose. Call your doctor or health care professional if you are unable to keep an appointment. What may interact with this medicine? Interactions are not expected. Give your health care provider a list of all the medicines, herbs, non-prescription drugs, or dietary supplements you use. Also tell them if you smoke, drink alcohol, or use illegal drugs. Some items may interact with your medicine. This list may not describe all possible interactions. Give your health  care provider a list of all the medicines, herbs, non-prescription drugs, or dietary supplements you use. Also tell them if you smoke, drink alcohol, or use illegal drugs. Some items may interact with your medicine. What should I watch for while using this medicine? Your condition will be monitored carefully while you are receiving this medicine. Report any side effects. Continue your course of treatment even though you feel ill unless your doctor tells you to stop. Do not become pregnant while taking this medicine or for 7 months after stopping  it. Women should inform their doctor if they wish to become pregnant or think they might be pregnant. Women of child-bearing potential will need to have a negative pregnancy test before starting this medicine. There is a potential for serious side effects to an unborn child. Talk to your health care professional or pharmacist for more information. Do not breast-feed an infant while taking this medicine or for 7 months after stopping it. Women must use effective birth control with this medicine. Call your doctor or health care professional for advice if you get a fever, chills or sore throat, or other symptoms of a cold or flu. Do not treat yourself. Try to avoid being around people who are sick. You may experience fever, chills, and headache during the infusion. Report any side effects during the infusion to your health care professional. What side effects may I notice from receiving this medicine? Side effects that you should report to your doctor or health care professional as soon as possible:  breathing problems  chest pain or palpitations  dizziness  feeling faint or lightheaded  fever or chills  skin rash, itching or hives  sore throat  swelling of the face, lips, or tongue  swelling of the legs or ankles  unusually weak or tired Side effects that usually do not require medical attention (report to your doctor or health care professional if they continue or are bothersome):  diarrhea  hair loss  nausea, vomiting  tiredness This list may not describe all possible side effects. Call your doctor for medical advice about side effects. You may report side effects to FDA at 1-800-FDA-1088. Where should I keep my medicine? This drug is given in a hospital or clinic and will not be stored at home. NOTE: This sheet is a summary. It may not cover all possible information. If you have questions about this medicine, talk to your doctor, pharmacist, or health care provider.  2020  Elsevier/Gold Standard (2015-04-16 12:08:50)  Docetaxel injection What is this medicine? DOCETAXEL (doe se TAX el) is a chemotherapy drug. It targets fast dividing cells, like cancer cells, and causes these cells to die. This medicine is used to treat many types of cancers like breast cancer, certain stomach cancers, head and neck cancer, lung cancer, and prostate cancer. This medicine may be used for other purposes; ask your health care provider or pharmacist if you have questions. COMMON BRAND NAME(S): Docefrez, Taxotere What should I tell my health care provider before I take this medicine? They need to know if you have any of these conditions:  infection (especially a virus infection such as chickenpox, cold sores, or herpes)  liver disease  low blood counts, like low white cell, platelet, or red cell counts  an unusual or allergic reaction to docetaxel, polysorbate 80, other chemotherapy agents, other medicines, foods, dyes, or preservatives  pregnant or trying to get pregnant  breast-feeding How should I use this medicine? This drug is given as an  infusion into a vein. It is administered in a hospital or clinic by a specially trained health care professional. Talk to your pediatrician regarding the use of this medicine in children. Special care may be needed. Overdosage: If you think you have taken too much of this medicine contact a poison control center or emergency room at once. NOTE: This medicine is only for you. Do not share this medicine with others. What if I miss a dose? It is important not to miss your dose. Call your doctor or health care professional if you are unable to keep an appointment. What may interact with this medicine?  aprepitant  certain antibiotics like erythromycin or clarithromycin  certain antivirals for HIV or hepatitis  certain medicines for fungal infections like fluconazole, itraconazole, ketoconazole, posaconazole, or  voriconazole  cimetidine  ciprofloxacin  conivaptan  cyclosporine  dronedarone  fluvoxamine  grapefruit juice  imatinib  verapamil This list may not describe all possible interactions. Give your health care provider a list of all the medicines, herbs, non-prescription drugs, or dietary supplements you use. Also tell them if you smoke, drink alcohol, or use illegal drugs. Some items may interact with your medicine. What should I watch for while using this medicine? Your condition will be monitored carefully while you are receiving this medicine. You will need important blood work done while you are taking this medicine. Call your doctor or health care professional for advice if you get a fever, chills or sore throat, or other symptoms of a cold or flu. Do not treat yourself. This drug decreases your body's ability to fight infections. Try to avoid being around people who are sick. Some products may contain alcohol. Ask your health care professional if this medicine contains alcohol. Be sure to tell all health care professionals you are taking this medicine. Certain medicines, like metronidazole and disulfiram, can cause an unpleasant reaction when taken with alcohol. The reaction includes flushing, headache, nausea, vomiting, sweating, and increased thirst. The reaction can last from 30 minutes to several hours. You may get drowsy or dizzy. Do not drive, use machinery, or do anything that needs mental alertness until you know how this medicine affects you. Do not stand or sit up quickly, especially if you are an older patient. This reduces the risk of dizzy or fainting spells. Alcohol may interfere with the effect of this medicine. Talk to your health care professional about your risk of cancer. You may be more at risk for certain types of cancer if you take this medicine. Do not become pregnant while taking this medicine or for 6 months after stopping it. Women should inform their doctor if  they wish to become pregnant or think they might be pregnant. There is a potential for serious side effects to an unborn child. Talk to your health care professional or pharmacist for more information. Do not breast-feed an infant while taking this medicine or for 1 to 2 weeks after stopping it. Males who get this medicine must use a condom during sex with females who can get pregnant. If you get a woman pregnant, the baby could have birth defects. The baby could die before they are born. You will need to continue wearing a condom for 3 months after stopping the medicine. Tell your health care provider right away if your partner becomes pregnant while you are taking this medicine. This may interfere with the ability to father a child. You should talk to your doctor or health care professional if you are  concerned about your fertility. What side effects may I notice from receiving this medicine? Side effects that you should report to your doctor or health care professional as soon as possible:  allergic reactions like skin rash, itching or hives, swelling of the face, lips, or tongue  blurred vision  breathing problems  changes in vision  low blood counts - This drug may decrease the number of white blood cells, red blood cells and platelets. You may be at increased risk for infections and bleeding.  nausea and vomiting  pain, redness or irritation at site where injected  pain, tingling, numbness in the hands or feet  redness, blistering, peeling, or loosening of the skin, including inside the mouth  signs of decreased platelets or bleeding - bruising, pinpoint red spots on the skin, black, tarry stools, nosebleeds  signs of decreased red blood cells - unusually weak or tired, fainting spells, lightheadedness  signs of infection - fever or chills, cough, sore throat, pain or difficulty passing urine  swelling of the ankle, feet, hands Side effects that usually do not require medical  attention (report to your doctor or health care professional if they continue or are bothersome):  constipation  diarrhea  fingernail or toenail changes  hair loss  loss of appetite  mouth sores  muscle pain This list may not describe all possible side effects. Call your doctor for medical advice about side effects. You may report side effects to FDA at 1-800-FDA-1088. Where should I keep my medicine? This drug is given in a hospital or clinic and will not be stored at home. NOTE: This sheet is a summary. It may not cover all possible information. If you have questions about this medicine, talk to your doctor, pharmacist, or health care provider.  2020 Elsevier/Gold Standard (2018-05-18 12:23:11)  Carboplatin injection What is this medicine? CARBOPLATIN (KAR boe pla tin) is a chemotherapy drug. It targets fast dividing cells, like cancer cells, and causes these cells to die. This medicine is used to treat ovarian cancer and many other cancers. This medicine may be used for other purposes; ask your health care provider or pharmacist if you have questions. COMMON BRAND NAME(S): Paraplatin What should I tell my health care provider before I take this medicine? They need to know if you have any of these conditions:  blood disorders  hearing problems  kidney disease  recent or ongoing radiation therapy  an unusual or allergic reaction to carboplatin, cisplatin, other chemotherapy, other medicines, foods, dyes, or preservatives  pregnant or trying to get pregnant  breast-feeding How should I use this medicine? This drug is usually given as an infusion into a vein. It is administered in a hospital or clinic by a specially trained health care professional. Talk to your pediatrician regarding the use of this medicine in children. Special care may be needed. Overdosage: If you think you have taken too much of this medicine contact a poison control center or emergency room at  once. NOTE: This medicine is only for you. Do not share this medicine with others. What if I miss a dose? It is important not to miss a dose. Call your doctor or health care professional if you are unable to keep an appointment. What may interact with this medicine?  medicines for seizures  medicines to increase blood counts like filgrastim, pegfilgrastim, sargramostim  some antibiotics like amikacin, gentamicin, neomycin, streptomycin, tobramycin  vaccines Talk to your doctor or health care professional before taking any of these medicines:  acetaminophen  aspirin  ibuprofen  ketoprofen  naproxen This list may not describe all possible interactions. Give your health care provider a list of all the medicines, herbs, non-prescription drugs, or dietary supplements you use. Also tell them if you smoke, drink alcohol, or use illegal drugs. Some items may interact with your medicine. What should I watch for while using this medicine? Your condition will be monitored carefully while you are receiving this medicine. You will need important blood work done while you are taking this medicine. This drug may make you feel generally unwell. This is not uncommon, as chemotherapy can affect healthy cells as well as cancer cells. Report any side effects. Continue your course of treatment even though you feel ill unless your doctor tells you to stop. In some cases, you may be given additional medicines to help with side effects. Follow all directions for their use. Call your doctor or health care professional for advice if you get a fever, chills or sore throat, or other symptoms of a cold or flu. Do not treat yourself. This drug decreases your body's ability to fight infections. Try to avoid being around people who are sick. This medicine may increase your risk to bruise or bleed. Call your doctor or health care professional if you notice any unusual bleeding. Be careful brushing and flossing your  teeth or using a toothpick because you may get an infection or bleed more easily. If you have any dental work done, tell your dentist you are receiving this medicine. Avoid taking products that contain aspirin, acetaminophen, ibuprofen, naproxen, or ketoprofen unless instructed by your doctor. These medicines may hide a fever. Do not become pregnant while taking this medicine. Women should inform their doctor if they wish to become pregnant or think they might be pregnant. There is a potential for serious side effects to an unborn child. Talk to your health care professional or pharmacist for more information. Do not breast-feed an infant while taking this medicine. What side effects may I notice from receiving this medicine? Side effects that you should report to your doctor or health care professional as soon as possible:  allergic reactions like skin rash, itching or hives, swelling of the face, lips, or tongue  signs of infection - fever or chills, cough, sore throat, pain or difficulty passing urine  signs of decreased platelets or bleeding - bruising, pinpoint red spots on the skin, black, tarry stools, nosebleeds  signs of decreased red blood cells - unusually weak or tired, fainting spells, lightheadedness  breathing problems  changes in hearing  changes in vision  chest pain  high blood pressure  low blood counts - This drug may decrease the number of white blood cells, red blood cells and platelets. You may be at increased risk for infections and bleeding.  nausea and vomiting  pain, swelling, redness or irritation at the injection site  pain, tingling, numbness in the hands or feet  problems with balance, talking, walking  trouble passing urine or change in the amount of urine Side effects that usually do not require medical attention (report to your doctor or health care professional if they continue or are bothersome):  hair loss  loss of appetite  metallic taste  in the mouth or changes in taste This list may not describe all possible side effects. Call your doctor for medical advice about side effects. You may report side effects to FDA at 1-800-FDA-1088. Where should I keep my medicine? This drug is given in  a hospital or clinic and will not be stored at home. NOTE: This sheet is a summary. It may not cover all possible information. If you have questions about this medicine, talk to your doctor, pharmacist, or health care provider.  2020 Elsevier/Gold Standard (2007-06-19 14:38:05)

## 2019-02-13 NOTE — Assessment & Plan Note (Signed)
01/18/2019: Patient palpated a left breast lump for 2 weeks. Mammogram showed a 2.3cm left breast mass at the 12 o'clock position, with 1 enlarged left axillary lymph node measuring 3.5cm. 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 Neoadjuvant chemotherapy with [facility] 6 cycles followed by Herceptin maintenance for 1 year 2. Followed by breast conserving surgery if possible with sentinel lymph node study 3. Followed by adjuvant radiation therapy if patient had lumpectomy No adverse effects from participation in the neuropathy clinical trial [id number] [id number] --------------------------------------------------------------------------------------------------------------------------------------- Current treatment: Cycle 1 day 1 [facility] Echocardiogram 01/29/2019: EF 60 to 65% Labs reviewed, chemo education completed, chemo consent obtained Return to clinic in 1 week for toxicity check

## 2019-02-14 ENCOUNTER — Other Ambulatory Visit: Payer: BC Managed Care – PPO

## 2019-02-14 ENCOUNTER — Ambulatory Visit: Payer: BC Managed Care – PPO

## 2019-02-14 ENCOUNTER — Ambulatory Visit: Payer: BC Managed Care – PPO | Admitting: Hematology and Oncology

## 2019-02-15 ENCOUNTER — Inpatient Hospital Stay: Payer: BC Managed Care – PPO

## 2019-02-15 ENCOUNTER — Other Ambulatory Visit: Payer: Self-pay

## 2019-02-15 VITALS — BP 118/72 | HR 88 | Temp 98.2°F | Resp 18

## 2019-02-15 DIAGNOSIS — C50412 Malignant neoplasm of upper-outer quadrant of left female breast: Secondary | ICD-10-CM

## 2019-02-15 MED ORDER — PEGFILGRASTIM-JMDB 6 MG/0.6ML ~~LOC~~ SOSY
6.0000 mg | PREFILLED_SYRINGE | Freq: Once | SUBCUTANEOUS | Status: AC
  Administered 2019-02-15: 10:00:00 6 mg via SUBCUTANEOUS

## 2019-02-15 MED ORDER — PEGFILGRASTIM-JMDB 6 MG/0.6ML ~~LOC~~ SOSY
PREFILLED_SYRINGE | SUBCUTANEOUS | Status: AC
  Filled 2019-02-15: qty 0.6

## 2019-02-15 NOTE — Patient Instructions (Signed)

## 2019-02-18 NOTE — Progress Notes (Signed)
Patient Care Team: [name], NP as PCP - General Mauro Kaufmann, RN as Oncology Nurse Navigator Rockwell Germany, RN as Oncology Nurse Navigator Donnie Mesa, MD as Consulting Physician (General Surgery) Nicholas Lose, MD as Consulting Physician (Hematology and Oncology) Kyung Rudd, MD as Consulting Physician (Radiation Oncology)  DIAGNOSIS:    ICD-10-CM   1. Malignant neoplasm of upper-outer quadrant of left breast in female, estrogen receptor positive ([place])  C50.412    Z17.0     SUMMARY OF ONCOLOGIC HISTORY: Oncology History  Malignant neoplasm of upper-outer quadrant of left breast in female, estrogen receptor positive ([place])  01/18/2019 Initial Diagnosis   Patient palpated a left breast lump for 2 weeks. Mammogram showed a 2.3cm left breast mass at the 12 o'clock position, with 1 enlarged left axillary lymph node measuring 3.5cm. 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   01/23/2019 Cancer Staging   Staging form: Breast, AJCC 8th Edition - Clinical stage from 01/23/2019: Stage IIA (cT2, cN1(f), cM0, G2, ER+, PR-, HER2+) - Signed by Nicholas Lose, MD on 01/23/2019   02/13/2019 -  Chemotherapy   The patient had palonosetron (ALOXI) injection 0.25 mg, 0.25 mg, Intravenous,  Once, 1 of 6 cycles Administration: 0.25 mg (02/13/2019) pegfilgrastim-jmdb (FULPHILA) injection 6 mg, 6 mg, Subcutaneous,  Once, 1 of 6 cycles CARBOplatin (PARAPLATIN) 700 mg in sodium chloride 0.9 % 250 mL chemo infusion, 700 mg (100 % of original dose 700 mg), Intravenous,  Once, 1 of 6 cycles Dose modification: 700 mg (original dose 700 mg, Cycle 1) Administration: 700 mg (02/13/2019) DOCEtaxel (TAXOTERE) 140 mg in sodium chloride 0.9 % 250 mL chemo infusion, 75 mg/m2 = 140 mg, Intravenous,  Once, 1 of 6 cycles Administration: 140 mg (02/13/2019) pertuzumab (PERJETA) 420 mg in sodium chloride 0.9 % 250 mL chemo infusion, 420 mg (100 % of original dose 420 mg),  Intravenous, Once, 1 of 6 cycles Dose modification: 420 mg (original dose 420 mg, Cycle 1, Reason: Provider Judgment) Administration: 420 mg (02/13/2019) fosaprepitant (EMEND) 150 mg, dexamethasone (DECADRON) 12 mg in sodium chloride 0.9 % 145 mL IVPB, , Intravenous,  Once, 1 of 6 cycles Administration:  (02/13/2019) trastuzumab-dkst (OGIVRI) 651 mg in sodium chloride 0.9 % 250 mL chemo infusion, 8 mg/kg = 651 mg, Intravenous,  Once, 1 of 6 cycles Administration: 651 mg (02/13/2019)  for chemotherapy treatment.     Genetic Testing   Negative genetic testing. No pathogenic variants identified on the [employer] Common Hereditary Cancers Panel. The Common Hereditary Cancers Panel offered by [employer] includes sequencing and/or deletion duplication testing of the following 48 genes: APC, ATM, AXIN2, BARD1, BMPR1A, BRCA1, BRCA2, BRIP1, CDH1, CDKN2A (p14ARF), CDKN2A (p16INK4a), CKD4, CHEK2, CTNNA1, DICER1, EPCAM (Deletion/duplication testing only), GREM1 (promoter region deletion/duplication testing only), KIT, MEN1, MLH1, MSH2, MSH3, MSH6, MUTYH, NBN, NF1, NHTL1, PALB2, PDGFRA, PMS2, POLD1, POLE, PTEN, RAD50, RAD51C, RAD51D, RNF43, SDHB, SDHC, SDHD, SMAD4, SMARCA4. STK11, TP53, TSC1, TSC2, and VHL.  The following genes were evaluated for sequence changes only: SDHA and HOXB13 c.251G>A variant only. The report date is 02/01/2019.      CHIEF COMPLIANT: Cycle 1 Day 8TCH Perjeta  INTERVAL HISTORY: Paige Perry is a 43 y.o. with above-mentioned history of eft breast cancer. She is currently undergoing neoadjuvant chemotherapy with [place] Perjeta.She presents to the clinic todayfor a toxicity check following cycle 1. She had a sore throat and acid reflux.  She had 2 episodes of loose stools.  REVIEW OF SYSTEMS:  Constitutional: Denies fevers, chills or abnormal weight loss Eyes: Denies blurriness of vision Ears, nose, mouth, throat, and face: Denies mucositis or sore throat Respiratory: Denies cough,  dyspnea or wheezes Cardiovascular: Denies palpitation, chest discomfort Gastrointestinal: Denies nausea, heartburn or change in bowel habits Skin: Denies abnormal skin rashes Lymphatics: Denies new lymphadenopathy or easy bruising Neurological: Denies numbness, tingling or new weaknesses Behavioral/Psych: Mood is stable, no new changes  Extremities: No lower extremity edema Breast: denies any pain or lumps or nodules in either breasts All other systems were reviewed with the patient and are negative.  I have reviewed the past medical history, past surgical history, social history and family history with the patient and they are unchanged from previous note.  ALLERGIES:  is allergic to other.  MEDICATIONS:  Current Outpatient Medications  Medication Sig Dispense Refill   cetirizine (ZYRTEC) 10 MG tablet Take 10 mg by mouth as needed.      dexamethasone (DECADRON) 4 MG tablet Take 1 tablet (4 mg total) by mouth daily. Take 1 tablet day before chemo and 1 tablet day after chemo with food 12 tablet 0   Etonogestrel-Ethinyl Estradiol (NUVARING VA) Place vaginally. Stays in for three weeks then take it out for one week.     HYDROcodone-acetaminophen (NORCO/VICODIN) 5-325 MG tablet Take 1 tablet by mouth every 6 (six) hours as needed for moderate pain. 15 tablet 0   ibuprofen (ADVIL,MOTRIN) 400 MG tablet Take 1 tablet (400 mg total) by mouth every 6 (six) hours as needed for pain. 30 tablet 0   lidocaine-prilocaine (EMLA) cream Apply to affected area once 30 g 3   LORazepam (ATIVAN) 0.5 MG tablet Take 1 tablet (0.5 mg total) by mouth at bedtime as needed (Nausea or vomiting). 30 tablet 0   nabumetone (RELAFEN) 500 MG tablet Take 1,000 mg by mouth 2 (two) times daily.     ondansetron (ZOFRAN) 8 MG tablet Take 1 tablet (8 mg total) by mouth 2 (two) times daily as needed (Nausea or vomiting). Begin 4 days after chemotherapy. 30 tablet 1   prochlorperazine (COMPAZINE) 10 MG tablet Take 1  tablet (10 mg total) by mouth every 6 (six) hours as needed (Nausea or vomiting). 30 tablet 1   rosuvastatin (CRESTOR) 20 MG tablet Take 20 mg by mouth daily.     No current facility-administered medications for this visit.     PHYSICAL EXAMINATION: ECOG PERFORMANCE STATUS: 1 - Symptomatic but completely ambulatory  Vitals:   02/19/19 1425  BP: 111/71  Pulse: (!) 112  Resp: 17  Temp: 98.2 F (36.8 C)  SpO2: 100%   Filed Weights   02/19/19 1425  Weight: 180 lb 3.2 oz (81.7 kg)    GENERAL: alert, no distress and comfortable SKIN: skin color, texture, turgor are normal, no rashes or significant lesions EYES: normal, Conjunctiva are pink and non-injected, sclera clear OROPHARYNX: no exudate, no erythema and lips, buccal mucosa, and tongue normal  NECK: supple, thyroid normal size, non-tender, without nodularity LYMPH: no palpable lymphadenopathy in the cervical, axillary or inguinal LUNGS: clear to auscultation and percussion with normal breathing effort HEART: regular rate & rhythm and no murmurs and no lower extremity edema ABDOMEN: abdomen soft, non-tender and normal bowel sounds MUSCULOSKELETAL: no cyanosis of digits and no clubbing  NEURO: alert & oriented x 3 with fluent speech, no focal motor/sensory deficits EXTREMITIES: No lower extremity edema  LABORATORY DATA:  I have reviewed the data as listed CMP Latest Ref Rng & Units 02/13/2019 01/23/2019 01/09/2012  Glucose 70 - 99 mg/dL 123(H) 85 75  BUN 6 - 20 mg/dL 11 9 10   Creatinine 0.44 - 1.00 mg/dL 0.82 0.80 0.90  Sodium 135 - 145 mmol/L 136 140 138  Potassium 3.5 - 5.1 mmol/L 4.1 3.9 3.8  Chloride 98 - 111 mmol/L 103 105 100  CO2 22 - 32 mmol/L 21(L) 25 24  Calcium 8.9 - 10.3 mg/dL 9.4 9.6 9.6  Total Protein 6.5 - 8.1 g/dL 8.1 7.7 -  Total Bilirubin 0.3 - 1.2 mg/dL 0.4 0.3 -  Alkaline Phos 38 - 126 U/L 81 73 -  AST 15 - 41 U/L 15 14(L) -  ALT 0 - 44 U/L 18 16 -    Lab Results  Component Value Date   WBC  1.9 (L) 02/19/2019   HGB 12.4 02/19/2019   HCT 37.9 02/19/2019   MCV 90.7 02/19/2019   PLT 239 02/19/2019   NEUTROABS PENDING 02/19/2019    ASSESSMENT & PLAN:  Malignant neoplasm of upper-outer quadrant of left breast in female, estrogen receptor positive ([place]) 01/18/2019:Patient palpated a left breast lump for 2 weeks. Mammogram showed a 2.3cm left breast mass at the 12 o'clock position, with 1 enlarged left axillary lymph node measuring 3.5cm. 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 Neoadjuvant chemotherapy with TCH Perjeta 6 cycles followed by Herceptin maintenance for 1 year 2. Followed by breast conserving surgery if possible with sentinel lymph node study 3. Followed by adjuvant radiation therapy if patient had lumpectomy No adverse effects from participation in the neuropathy clinical trial SWOG 1714 --------------------------------------------------------------------------------------------------------------------------------------- Current treatment: Cycle 1 day 8 [place] Perjeta Echocardiogram 01/29/2019: EF 60 to 65%  Chemo toxicities: 1.  2 episodes of loose stools 2.  Change in taste 3.  Acid reflux: I sent a prescription for Protonix 4.  Leukopenia: Awaiting ANC  Monitoring closely for toxicities. Return to clinic in 2 weeks for cycle 2    No orders of the defined types were placed in this encounter.  The patient has a good understanding of the overall plan. she agrees with it. she will call with any problems that may develop before the next visit here.  Nicholas Lose, MD 02/19/2019  Julious Oka Dorshimer, am acting as scribe for Dr. Nicholas Lose.  I have reviewed the above documentation for accuracy and completeness, and I agree with the above.

## 2019-02-19 ENCOUNTER — Inpatient Hospital Stay (HOSPITAL_BASED_OUTPATIENT_CLINIC_OR_DEPARTMENT_OTHER): Payer: BC Managed Care – PPO | Admitting: Hematology and Oncology

## 2019-02-19 ENCOUNTER — Other Ambulatory Visit: Payer: Self-pay

## 2019-02-19 ENCOUNTER — Inpatient Hospital Stay: Payer: BC Managed Care – PPO

## 2019-02-19 DIAGNOSIS — Z95828 Presence of other vascular implants and grafts: Secondary | ICD-10-CM

## 2019-02-19 DIAGNOSIS — Z170 Estrogen receptor positive status [ER+]: Secondary | ICD-10-CM

## 2019-02-19 DIAGNOSIS — C50412 Malignant neoplasm of upper-outer quadrant of left female breast: Secondary | ICD-10-CM

## 2019-02-19 LAB — CMP (CANCER CENTER ONLY)
ALT: 32 U/L (ref 0–44)
AST: 24 U/L (ref 15–41)
Albumin: 3.3 g/dL — ABNORMAL LOW (ref 3.5–5.0)
Alkaline Phosphatase: 87 U/L (ref 38–126)
Anion gap: 9 (ref 5–15)
BUN: 11 mg/dL (ref 6–20)
CO2: 24 mmol/L (ref 22–32)
Calcium: 8.9 mg/dL (ref 8.9–10.3)
Chloride: 103 mmol/L (ref 98–111)
Creatinine: 0.76 mg/dL (ref 0.44–1.00)
GFR, Est AFR Am: >60 mL/min (ref >60)
GFR, Estimated: >60 mL/min (ref >60)
Glucose, Bld: 128 mg/dL — ABNORMAL HIGH (ref 70–99)
Potassium: 4 mmol/L (ref 3.5–5.1)
Sodium: 136 mmol/L (ref 135–145)
Total Bilirubin: 0.3 mg/dL (ref 0.3–1.2)
Total Protein: 7.3 g/dL (ref 6.5–8.1)

## 2019-02-19 LAB — CBC WITH DIFFERENTIAL (CANCER CENTER ONLY)
Abs Immature Granulocytes: 0.03 10*3/uL (ref 0.00–0.07)
Basophils Absolute: 0 10*3/uL (ref 0.0–0.1)
Basophils Relative: 1 %
Eosinophils Absolute: 0 10*3/uL (ref 0.0–0.5)
Eosinophils Relative: 1 %
HCT: 37.9 % (ref 36.0–46.0)
Hemoglobin: 12.4 g/dL (ref 12.0–15.0)
Immature Granulocytes: 2 %
Lymphocytes Relative: 50 %
Lymphs Abs: 1 10*3/uL (ref 0.7–4.0)
MCH: 29.7 pg (ref 26.0–34.0)
MCHC: 32.7 g/dL (ref 30.0–36.0)
MCV: 90.7 fL (ref 80.0–100.0)
Monocytes Absolute: 0.2 10*3/uL (ref 0.1–1.0)
Monocytes Relative: 11 %
Neutro Abs: 0.7 10*3/uL — ABNORMAL LOW (ref 1.7–7.7)
Neutrophils Relative %: 35 %
Platelet Count: 239 10*3/uL (ref 150–400)
RBC: 4.18 MIL/uL (ref 3.87–5.11)
RDW: 11.9 % (ref 11.5–15.5)
WBC Count: 1.9 10*3/uL — ABNORMAL LOW (ref 4.0–10.5)
nRBC: 0 % (ref 0.0–0.2)

## 2019-02-19 MED ORDER — HEPARIN SOD (PORK) LOCK FLUSH 100 UNIT/ML IV SOLN
500.0000 [IU] | Freq: Once | INTRAVENOUS | Status: AC
  Administered 2019-02-19: 500 [IU] via INTRAVENOUS
  Filled 2019-02-19: qty 5

## 2019-02-19 MED ORDER — SODIUM CHLORIDE 0.9% FLUSH
10.0000 mL | INTRAVENOUS | Status: DC | PRN
  Administered 2019-02-19: 10 mL via INTRAVENOUS
  Filled 2019-02-19: qty 10

## 2019-02-19 MED ORDER — PANTOPRAZOLE SODIUM 40 MG PO TBEC: 40.0000 mg | DELAYED_RELEASE_TABLET | Freq: Every day | ORAL | 1 refills | Status: DC

## 2019-02-19 NOTE — Assessment & Plan Note (Signed)
01/18/2019:Patient palpated a left breast lump for 2 weeks. Mammogram showed a 2.3cm left breast mass at the 12 o'clock position, with 1 enlarged left axillary lymph node measuring 3.5cm. 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 Neoadjuvant chemotherapy with TCH Perjeta 6 cycles followed by Herceptin maintenance for 1 year 2. Followed by breast conserving surgery if possible with sentinel lymph node study 3. Followed by adjuvant radiation therapy if patient had lumpectomy No adverse effects from participation in the neuropathy clinical trial SWOG 1714 --------------------------------------------------------------------------------------------------------------------------------------- Current treatment: Cycle 1 day 8 [place] Perjeta Echocardiogram 01/29/2019: EF 60 to 65%  Chemo toxicities:  Monitoring closely for toxicities. Return to clinic in 2 weeks for cycle 2

## 2019-02-26 ENCOUNTER — Ambulatory Visit: Payer: BC Managed Care – PPO | Admitting: Hematology and Oncology

## 2019-02-26 ENCOUNTER — Ambulatory Visit: Payer: BC Managed Care – PPO

## 2019-02-26 ENCOUNTER — Other Ambulatory Visit: Payer: BC Managed Care – PPO

## 2019-02-28 ENCOUNTER — Ambulatory Visit: Payer: BC Managed Care – PPO

## 2019-03-03 NOTE — Progress Notes (Signed)
Patient Care Team: [name], NP as PCP - General Mauro Kaufmann, RN as Oncology Nurse Navigator Rockwell Germany, RN as Oncology Nurse Navigator Donnie Mesa, MD as Consulting Physician (General Surgery) Nicholas Lose, MD as Consulting Physician (Hematology and Oncology) Kyung Rudd, MD as Consulting Physician (Radiation Oncology)  DIAGNOSIS:    ICD-10-CM   1. Malignant neoplasm of upper-outer quadrant of left breast in female, estrogen receptor positive ([place])  C50.412    Z17.0     SUMMARY OF ONCOLOGIC HISTORY: Oncology History  Malignant neoplasm of upper-outer quadrant of left breast in female, estrogen receptor positive ([place])  01/18/2019 Initial Diagnosis   Patient palpated a left breast lump for 2 weeks. Mammogram showed a 2.3cm left breast mass at the 12 o'clock position, with 1 enlarged left axillary lymph node measuring 3.5cm. 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   01/23/2019 Cancer Staging   Staging form: Breast, AJCC 8th Edition - Clinical stage from 01/23/2019: Stage IIA (cT2, cN1(f), cM0, G2, ER+, PR-, HER2+) - Signed by Nicholas Lose, MD on 01/23/2019   02/13/2019 -  Chemotherapy   The patient had palonosetron (ALOXI) injection 0.25 mg, 0.25 mg, Intravenous,  Once, 1 of 6 cycles Administration: 0.25 mg (02/13/2019) pegfilgrastim-jmdb (FULPHILA) injection 6 mg, 6 mg, Subcutaneous,  Once, 1 of 6 cycles Administration: 6 mg (02/15/2019) CARBOplatin (PARAPLATIN) 700 mg in sodium chloride 0.9 % 250 mL chemo infusion, 700 mg (100 % of original dose 700 mg), Intravenous,  Once, 1 of 6 cycles Dose modification: 700 mg (original dose 700 mg, Cycle 1) Administration: 700 mg (02/13/2019) DOCEtaxel (TAXOTERE) 140 mg in sodium chloride 0.9 % 250 mL chemo infusion, 75 mg/m2 = 140 mg, Intravenous,  Once, 1 of 6 cycles Administration: 140 mg (02/13/2019) pertuzumab (PERJETA) 420 mg in sodium chloride 0.9 % 250 mL chemo infusion, 420 mg  (100 % of original dose 420 mg), Intravenous, Once, 1 of 6 cycles Dose modification: 420 mg (original dose 420 mg, Cycle 1, Reason: Provider Judgment) Administration: 420 mg (02/13/2019) fosaprepitant (EMEND) 150 mg, dexamethasone (DECADRON) 12 mg in sodium chloride 0.9 % 145 mL IVPB, , Intravenous,  Once, 1 of 6 cycles Administration:  (02/13/2019) trastuzumab-dkst (OGIVRI) 651 mg in sodium chloride 0.9 % 250 mL chemo infusion, 8 mg/kg = 651 mg, Intravenous,  Once, 1 of 6 cycles Administration: 651 mg (02/13/2019)  for chemotherapy treatment.     Genetic Testing   Negative genetic testing. No pathogenic variants identified on the [employer] Common Hereditary Cancers Panel. The Common Hereditary Cancers Panel offered by [facility] includes sequencing and/or deletion duplication testing of the following 48 genes: APC, ATM, AXIN2, BARD1, BMPR1A, BRCA1, BRCA2, BRIP1, CDH1, CDKN2A (p14ARF), CDKN2A (p16INK4a), CKD4, CHEK2, CTNNA1, DICER1, EPCAM (Deletion/duplication testing only), GREM1 (promoter region deletion/duplication testing only), KIT, MEN1, MLH1, MSH2, MSH3, MSH6, MUTYH, NBN, NF1, NHTL1, PALB2, PDGFRA, PMS2, POLD1, POLE, PTEN, RAD50, RAD51C, RAD51D, RNF43, SDHB, SDHC, SDHD, SMAD4, SMARCA4. STK11, TP53, TSC1, TSC2, and VHL.  The following genes were evaluated for sequence changes only: SDHA and HOXB13 c.251G>A variant only. The report date is 02/01/2019.      CHIEF COMPLIANT: Cycle 2TCH Perjeta  INTERVAL HISTORY: Paige Perry is a 43 y.o. with above-mentioned history of left breast cancer. She is currently undergoing neoadjuvant chemotherapy with [place] Perjeta.She presents to the clinic todayfor cycle 2.  She had 1 day of severe diarrhea for which she took multiple tablets of Imodium.  She did not have any nausea or  vomiting.  REVIEW OF SYSTEMS:   Constitutional: Denies fevers, chills or abnormal weight loss Eyes: Denies blurriness of vision Ears, nose, mouth, throat, and face: Denies mucositis  or sore throat Respiratory: Denies cough, dyspnea or wheezes Cardiovascular: Denies palpitation, chest discomfort Gastrointestinal: 24 hours of profound diarrhea which subsided with Imodium Skin: Denies abnormal skin rashes Lymphatics: Denies new lymphadenopathy or easy bruising Neurological: Denies numbness, tingling or new weaknesses Behavioral/Psych: Mood is stable, no new changes  Extremities: No lower extremity edema Breast: denies any pain or lumps or nodules in either breasts All other systems were reviewed with the patient and are negative.  I have reviewed the past medical history, past surgical history, social history and family history with the patient and they are unchanged from previous note.  ALLERGIES:  is allergic to other.  MEDICATIONS:  Current Outpatient Medications  Medication Sig Dispense Refill  . cetirizine (ZYRTEC) 10 MG tablet Take 10 mg by mouth as needed.     Marland Kitchen dexamethasone (DECADRON) 4 MG tablet Take 1 tablet (4 mg total) by mouth daily. Take 1 tablet day before chemo and 1 tablet day after chemo with food 12 tablet 0  . Etonogestrel-Ethinyl Estradiol (NUVARING VA) Place vaginally. Stays in for three weeks then take it out for one week.    Marland Kitchen HYDROcodone-acetaminophen (NORCO/VICODIN) 5-325 MG tablet Take 1 tablet by mouth every 6 (six) hours as needed for moderate pain. 15 tablet 0  . ibuprofen (ADVIL,MOTRIN) 400 MG tablet Take 1 tablet (400 mg total) by mouth every 6 (six) hours as needed for pain. 30 tablet 0  . lidocaine-prilocaine (EMLA) cream Apply to affected area once 30 g 3  . LORazepam (ATIVAN) 0.5 MG tablet Take 1 tablet (0.5 mg total) by mouth at bedtime as needed (Nausea or vomiting). 30 tablet 0  . nabumetone (RELAFEN) 500 MG tablet Take 1,000 mg by mouth 2 (two) times daily.    . ondansetron (ZOFRAN) 8 MG tablet Take 1 tablet (8 mg total) by mouth 2 (two) times daily as needed (Nausea or vomiting). Begin 4 days after chemotherapy. 30 tablet 1  .  pantoprazole (PROTONIX) 40 MG tablet Take 1 tablet (40 mg total) by mouth daily. 30 tablet 1  . prochlorperazine (COMPAZINE) 10 MG tablet Take 1 tablet (10 mg total) by mouth every 6 (six) hours as needed (Nausea or vomiting). 30 tablet 1  . rosuvastatin (CRESTOR) 20 MG tablet Take 20 mg by mouth daily.     No current facility-administered medications for this visit.     PHYSICAL EXAMINATION: ECOG PERFORMANCE STATUS: 1 - Symptomatic but completely ambulatory  Vitals:   03/04/19 0834  BP: 118/78  Pulse: 94  Resp: 18  Temp: 97.8 F (36.6 C)  SpO2: 99%   Filed Weights   03/04/19 0834  Weight: 186 lb 4.8 oz (84.5 kg)    GENERAL: alert, no distress and comfortable SKIN: skin color, texture, turgor are normal, no rashes or significant lesions EYES: normal, Conjunctiva are pink and non-injected, sclera clear OROPHARYNX: no exudate, no erythema and lips, buccal mucosa, and tongue normal  NECK: supple, thyroid normal size, non-tender, without nodularity LYMPH: no palpable lymphadenopathy in the cervical, axillary or inguinal LUNGS: clear to auscultation and percussion with normal breathing effort HEART: regular rate & rhythm and no murmurs and no lower extremity edema ABDOMEN: abdomen soft, non-tender and normal bowel sounds MUSCULOSKELETAL: no cyanosis of digits and no clubbing  NEURO: alert & oriented x 3 with fluent speech, no focal motor/sensory  deficits EXTREMITIES: No lower extremity edema  LABORATORY DATA:  I have reviewed the data as listed CMP Latest Ref Rng & Units 02/19/2019 02/13/2019 01/23/2019  Glucose 70 - 99 mg/dL 128(H) 123(H) 85  BUN 6 - 20 mg/dL _0 Creatinine 0.44 - 1.00 mg/dL 0.76 0.82 0.80  Sodium 135 - 145 mmol/L 136 136 140  Potassium 3.5 - 5.1 mmol/L 4.0 4.1 3.9  Chloride 98 - 111 mmol/L 103 103 105  CO2 22 - 32 mmol/L 24 21(L) 25  Calcium 8.9 - 10.3 mg/dL 8.9 9.4 9.6  Total Protein 6.5 - 8.1 g/dL 7.3 8.1 7.7  Total Bilirubin 0.3 - 1.2 mg/dL 0.3  0.4 0.3  Alkaline Phos 38 - 126 U/L 87 81 73  AST 15 - 41 U/L 24 15 14(L)  ALT 0 - 44 U/L 32 18 16    Lab Results  Component Value Date   WBC 11.3 (H) 03/04/2019   HGB 11.4 (L) 03/04/2019   HCT 34.0 (L) 03/04/2019   MCV 88.3 03/04/2019   PLT 203 03/04/2019   NEUTROABS 9.8 (H) 03/04/2019    ASSESSMENT & PLAN:  Malignant neoplasm of upper-outer quadrant of left breast in female, estrogen receptor positive ([place]) 01/18/2019:Patient palpated a left breast lump for 2 weeks. Mammogram showed a 2.3cm left breast mass at the 12 o'clock position, with 1 enlarged left axillary lymph node measuring 3.5cm. 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 Neoadjuvant chemotherapy with TCH Perjeta 6 cycles followed by Herceptin maintenance for 1 year 2. Followed by breast conserving surgery if possible with sentinel lymph node study 3. Followed by adjuvant radiation therapy if patient had lumpectomy No adverse effects from participation in the neuropathy clinical trial SWOG 1714 --------------------------------------------------------------------------------------------------------------------------------------- Current treatment: Cycle 2 [place] Perjeta Echocardiogram 01/29/2019: EF 60 to 65%  Chemo toxicities: 1.  Loose stools: Patient had full-blown diarrhea for 24 hours and it subsided.  She did take Imodium which helped. 2.  Change in taste 3.  Acid reflux: Protonix 4.  Chemotherapy-induced anemia: Being observed  Monitoring closely for toxicities. Return to clinic in 3 weeks for cycle 3    No orders of the defined types were placed in this encounter.  The patient has a good understanding of the overall plan. she agrees with it. she will call with any problems that may develop before the next visit here.  Nicholas Lose, MD 03/04/2019  Julious Oka Dorshimer, am acting as scribe for Dr. Nicholas Lose.  I have  reviewed the above documentation for accuracy and completeness, and I agree with the above.

## 2019-03-04 ENCOUNTER — Inpatient Hospital Stay: Payer: BC Managed Care – PPO | Attending: Hematology and Oncology

## 2019-03-04 ENCOUNTER — Other Ambulatory Visit: Payer: Self-pay

## 2019-03-04 ENCOUNTER — Inpatient Hospital Stay: Payer: BC Managed Care – PPO

## 2019-03-04 ENCOUNTER — Inpatient Hospital Stay (HOSPITAL_BASED_OUTPATIENT_CLINIC_OR_DEPARTMENT_OTHER): Payer: BC Managed Care – PPO | Admitting: Hematology and Oncology

## 2019-03-04 DIAGNOSIS — Z5112 Encounter for antineoplastic immunotherapy: Secondary | ICD-10-CM | POA: Insufficient documentation

## 2019-03-04 DIAGNOSIS — C50412 Malignant neoplasm of upper-outer quadrant of left female breast: Secondary | ICD-10-CM | POA: Insufficient documentation

## 2019-03-04 DIAGNOSIS — R197 Diarrhea, unspecified: Secondary | ICD-10-CM | POA: Insufficient documentation

## 2019-03-04 DIAGNOSIS — Z793 Long term (current) use of hormonal contraceptives: Secondary | ICD-10-CM | POA: Insufficient documentation

## 2019-03-04 DIAGNOSIS — K219 Gastro-esophageal reflux disease without esophagitis: Secondary | ICD-10-CM | POA: Insufficient documentation

## 2019-03-04 DIAGNOSIS — C773 Secondary and unspecified malignant neoplasm of axilla and upper limb lymph nodes: Secondary | ICD-10-CM | POA: Diagnosis not present

## 2019-03-04 DIAGNOSIS — Z5111 Encounter for antineoplastic chemotherapy: Secondary | ICD-10-CM | POA: Insufficient documentation

## 2019-03-04 DIAGNOSIS — Z7952 Long term (current) use of systemic steroids: Secondary | ICD-10-CM | POA: Insufficient documentation

## 2019-03-04 DIAGNOSIS — Z791 Long term (current) use of non-steroidal anti-inflammatories (NSAID): Secondary | ICD-10-CM | POA: Diagnosis not present

## 2019-03-04 DIAGNOSIS — Z170 Estrogen receptor positive status [ER+]: Secondary | ICD-10-CM

## 2019-03-04 DIAGNOSIS — Z5189 Encounter for other specified aftercare: Secondary | ICD-10-CM | POA: Diagnosis not present

## 2019-03-04 DIAGNOSIS — E785 Hyperlipidemia, unspecified: Secondary | ICD-10-CM | POA: Insufficient documentation

## 2019-03-04 DIAGNOSIS — Z79899 Other long term (current) drug therapy: Secondary | ICD-10-CM | POA: Diagnosis not present

## 2019-03-04 LAB — CBC WITH DIFFERENTIAL (CANCER CENTER ONLY)
Abs Immature Granulocytes: 0.03 10*3/uL (ref 0.00–0.07)
Basophils Absolute: 0 10*3/uL (ref 0.0–0.1)
Basophils Relative: 0 %
Eosinophils Absolute: 0 10*3/uL (ref 0.0–0.5)
Eosinophils Relative: 0 %
HCT: 34 % — ABNORMAL LOW (ref 36.0–46.0)
Hemoglobin: 11.4 g/dL — ABNORMAL LOW (ref 12.0–15.0)
Immature Granulocytes: 0 %
Lymphocytes Relative: 11 %
Lymphs Abs: 1.2 10*3/uL (ref 0.7–4.0)
MCH: 29.6 pg (ref 26.0–34.0)
MCHC: 33.5 g/dL (ref 30.0–36.0)
MCV: 88.3 fL (ref 80.0–100.0)
Monocytes Absolute: 0.2 10*3/uL (ref 0.1–1.0)
Monocytes Relative: 2 %
Neutro Abs: 9.8 10*3/uL — ABNORMAL HIGH (ref 1.7–7.7)
Neutrophils Relative %: 87 %
Platelet Count: 203 10*3/uL (ref 150–400)
RBC: 3.85 MIL/uL — ABNORMAL LOW (ref 3.87–5.11)
RDW: 11.9 % (ref 11.5–15.5)
WBC Count: 11.3 10*3/uL — ABNORMAL HIGH (ref 4.0–10.5)
nRBC: 0 % (ref 0.0–0.2)

## 2019-03-04 LAB — CMP (CANCER CENTER ONLY)
ALT: 18 U/L (ref 0–44)
AST: 15 U/L (ref 15–41)
Albumin: 3.2 g/dL — ABNORMAL LOW (ref 3.5–5.0)
Alkaline Phosphatase: 80 U/L (ref 38–126)
Anion gap: 12 (ref 5–15)
BUN: 12 mg/dL (ref 6–20)
CO2: 22 mmol/L (ref 22–32)
Calcium: 9.2 mg/dL (ref 8.9–10.3)
Chloride: 105 mmol/L (ref 98–111)
Creatinine: 0.89 mg/dL (ref 0.44–1.00)
GFR, Est AFR Am: >60 mL/min (ref >60)
GFR, Estimated: >60 mL/min (ref >60)
Glucose, Bld: 182 mg/dL — ABNORMAL HIGH (ref 70–99)
Potassium: 3.8 mmol/L (ref 3.5–5.1)
Sodium: 139 mmol/L (ref 135–145)
Total Bilirubin: 0.3 mg/dL (ref 0.3–1.2)
Total Protein: 7.2 g/dL (ref 6.5–8.1)

## 2019-03-04 MED ORDER — ACETAMINOPHEN 325 MG PO TABS
650.0000 mg | ORAL_TABLET | Freq: Once | ORAL | Status: AC
  Administered 2019-03-04: 650 mg via ORAL

## 2019-03-04 MED ORDER — SODIUM CHLORIDE 0.9 % IV SOLN
700.0000 mg | Freq: Once | INTRAVENOUS | Status: AC
  Administered 2019-03-04: 700 mg via INTRAVENOUS
  Filled 2019-03-04: qty 70

## 2019-03-04 MED ORDER — HEPARIN SOD (PORK) LOCK FLUSH 100 UNIT/ML IV SOLN
500.0000 [IU] | Freq: Once | INTRAVENOUS | Status: AC | PRN
  Administered 2019-03-04: 500 [IU]
  Filled 2019-03-04: qty 5

## 2019-03-04 MED ORDER — ACETAMINOPHEN 325 MG PO TABS
ORAL_TABLET | ORAL | Status: AC
  Filled 2019-03-04: qty 2

## 2019-03-04 MED ORDER — DIPHENHYDRAMINE HCL 25 MG PO CAPS
50.0000 mg | ORAL_CAPSULE | Freq: Once | ORAL | Status: AC
  Administered 2019-03-04: 50 mg via ORAL

## 2019-03-04 MED ORDER — PALONOSETRON HCL INJECTION 0.25 MG/5ML
INTRAVENOUS | Status: AC
  Filled 2019-03-04: qty 5

## 2019-03-04 MED ORDER — SODIUM CHLORIDE 0.9 % IV SOLN
75.0000 mg/m2 | Freq: Once | INTRAVENOUS | Status: AC
  Administered 2019-03-04: 140 mg via INTRAVENOUS
  Filled 2019-03-04: qty 14

## 2019-03-04 MED ORDER — SODIUM CHLORIDE 0.9 % IV SOLN
Freq: Once | INTRAVENOUS | Status: AC
  Administered 2019-03-04: 09:00:00 via INTRAVENOUS
  Filled 2019-03-04: qty 250

## 2019-03-04 MED ORDER — DIPHENHYDRAMINE HCL 25 MG PO CAPS
ORAL_CAPSULE | ORAL | Status: AC
  Filled 2019-03-04: qty 2

## 2019-03-04 MED ORDER — SODIUM CHLORIDE 0.9% FLUSH
10.0000 mL | INTRAVENOUS | Status: DC | PRN
  Administered 2019-03-04: 10 mL
  Filled 2019-03-04: qty 10

## 2019-03-04 MED ORDER — SODIUM CHLORIDE 0.9 % IV SOLN
Freq: Once | INTRAVENOUS | Status: AC
  Administered 2019-03-04: 09:00:00 via INTRAVENOUS
  Filled 2019-03-04: qty 5

## 2019-03-04 MED ORDER — TRASTUZUMAB-DKST CHEMO 150 MG IV SOLR
6.0000 mg/kg | Freq: Once | INTRAVENOUS | Status: AC
  Administered 2019-03-04: 483 mg via INTRAVENOUS
  Filled 2019-03-04: qty 23

## 2019-03-04 MED ORDER — SODIUM CHLORIDE 0.9 % IV SOLN
420.0000 mg | Freq: Once | INTRAVENOUS | Status: AC
  Administered 2019-03-04: 420 mg via INTRAVENOUS
  Filled 2019-03-04: qty 14

## 2019-03-04 MED ORDER — PALONOSETRON HCL INJECTION 0.25 MG/5ML
0.2500 mg | Freq: Once | INTRAVENOUS | Status: AC
  Administered 2019-03-04: 0.25 mg via INTRAVENOUS

## 2019-03-04 NOTE — Assessment & Plan Note (Signed)
01/18/2019:Patient palpated a left breast lump for 2 weeks. Mammogram showed a 2.3cm left breast mass at the 12 o'clock position, with 1 enlarged left axillary lymph node measuring 3.5cm. 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 Neoadjuvant chemotherapy with TCH Perjeta 6 cycles followed by Herceptin maintenance for 1 year 2. Followed by breast conserving surgery if possible with sentinel lymph node study 3. Followed by adjuvant radiation therapy if patient had lumpectomy No adverse effects from participation in the neuropathy clinical trial SWOG 1714 --------------------------------------------------------------------------------------------------------------------------------------- Current treatment: Cycle 2 [place] Perjeta Echocardiogram 01/29/2019: EF 60 to 65%  Chemo toxicities: 1.  2 episodes of loose stools 2.  Change in taste 3.  Acid reflux: I sent a prescription for Protonix 4.  Leukopenia: Awaiting ANC  Monitoring closely for toxicities. Return to clinic in 3 weeks for cycle 3

## 2019-03-04 NOTE — Patient Instructions (Signed)
[facility] Discharge Instructions for Patients Receiving Chemotherapy  Today you received the following chemotherapy agents Trastuzumab; Pertuzumab; Docetaxel; Carboplatin  To help prevent nausea and vomiting after your treatment, we encourage you to take your nausea medication as directed   If you develop nausea and vomiting that is not controlled by your nausea medication, call the clinic.   BELOW ARE SYMPTOMS THAT SHOULD BE REPORTED IMMEDIATELY:  *FEVER GREATER THAN 100.5 F  *CHILLS WITH OR WITHOUT FEVER  NAUSEA AND VOMITING THAT IS NOT CONTROLLED WITH YOUR NAUSEA MEDICATION  *UNUSUAL SHORTNESS OF BREATH  *UNUSUAL BRUISING OR BLEEDING  TENDERNESS IN MOUTH AND THROAT WITH OR WITHOUT PRESENCE OF ULCERS  *URINARY PROBLEMS  *BOWEL PROBLEMS  UNUSUAL RASH Items with * indicate a potential emergency and should be followed up as soon as possible.  Feel free to call the clinic should you have any questions or concerns. The clinic phone number is [phone].  Please show the CHEMO ALERT CARD at check-in to the Emergency Department and triage nurse.   

## 2019-03-05 ENCOUNTER — Ambulatory Visit: Payer: BC Managed Care – PPO

## 2019-03-06 ENCOUNTER — Other Ambulatory Visit: Payer: Self-pay

## 2019-03-06 ENCOUNTER — Inpatient Hospital Stay: Payer: BC Managed Care – PPO

## 2019-03-06 VITALS — BP 130/74 | HR 89 | Temp 98.2°F | Resp 18

## 2019-03-06 DIAGNOSIS — C50412 Malignant neoplasm of upper-outer quadrant of left female breast: Secondary | ICD-10-CM

## 2019-03-06 DIAGNOSIS — Z5189 Encounter for other specified aftercare: Secondary | ICD-10-CM | POA: Diagnosis not present

## 2019-03-06 DIAGNOSIS — Z170 Estrogen receptor positive status [ER+]: Secondary | ICD-10-CM

## 2019-03-06 MED ORDER — PEGFILGRASTIM-JMDB 6 MG/0.6ML ~~LOC~~ SOSY
PREFILLED_SYRINGE | SUBCUTANEOUS | Status: AC
  Filled 2019-03-06: qty 0.6

## 2019-03-06 MED ORDER — PEGFILGRASTIM-JMDB 6 MG/0.6ML ~~LOC~~ SOSY
6.0000 mg | PREFILLED_SYRINGE | Freq: Once | SUBCUTANEOUS | Status: AC
  Administered 2019-03-06: 6 mg via SUBCUTANEOUS

## 2019-03-06 NOTE — Patient Instructions (Signed)

## 2019-03-14 ENCOUNTER — Other Ambulatory Visit: Payer: Self-pay | Admitting: Hematology and Oncology

## 2019-03-19 ENCOUNTER — Ambulatory Visit: Payer: BC Managed Care – PPO | Admitting: Hematology and Oncology

## 2019-03-19 ENCOUNTER — Ambulatory Visit: Payer: BC Managed Care – PPO

## 2019-03-19 ENCOUNTER — Other Ambulatory Visit: Payer: BC Managed Care – PPO

## 2019-03-21 ENCOUNTER — Ambulatory Visit: Payer: BC Managed Care – PPO

## 2019-03-25 NOTE — Progress Notes (Signed)
Patient Care Team: [name], NP as PCP - General Mauro Kaufmann, RN as Oncology Nurse Navigator Rockwell Germany, RN as Oncology Nurse Navigator Donnie Mesa, MD as Consulting Physician (General Surgery) Nicholas Lose, MD as Consulting Physician (Hematology and Oncology) Kyung Rudd, MD as Consulting Physician (Radiation Oncology)  DIAGNOSIS:    ICD-10-CM   1. Malignant neoplasm of upper-outer quadrant of left breast in female, estrogen receptor positive ([place])  C50.412    Z17.0     SUMMARY OF ONCOLOGIC HISTORY: Oncology History  Malignant neoplasm of upper-outer quadrant of left breast in female, estrogen receptor positive ([place])  01/18/2019 Initial Diagnosis   Patient palpated a left breast lump for 2 weeks. Mammogram showed a 2.3cm left breast mass at the 12 o'clock position, with 1 enlarged left axillary lymph node measuring 3.5cm. 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   01/23/2019 Cancer Staging   Staging form: Breast, AJCC 8th Edition - Clinical stage from 01/23/2019: Stage IIA (cT2, cN1(f), cM0, G2, ER+, PR-, HER2+) - Signed by Nicholas Lose, MD on 01/23/2019   02/13/2019 -  Chemotherapy   The patient had palonosetron (ALOXI) injection 0.25 mg, 0.25 mg, Intravenous,  Once, 2 of 6 cycles Administration: 0.25 mg (02/13/2019), 0.25 mg (03/04/2019) pegfilgrastim-jmdb (FULPHILA) injection 6 mg, 6 mg, Subcutaneous,  Once, 2 of 6 cycles Administration: 6 mg (02/15/2019), 6 mg (03/06/2019) CARBOplatin (PARAPLATIN) 700 mg in sodium chloride 0.9 % 250 mL chemo infusion, 700 mg (100 % of original dose 700 mg), Intravenous,  Once, 2 of 6 cycles Dose modification: 700 mg (original dose 700 mg, Cycle 1) Administration: 700 mg (02/13/2019), 700 mg (03/04/2019) DOCEtaxel (TAXOTERE) 140 mg in sodium chloride 0.9 % 250 mL chemo infusion, 75 mg/m2 = 140 mg, Intravenous,  Once, 2 of 6 cycles Administration: 140 mg (02/13/2019), 140 mg  (03/04/2019) pertuzumab (PERJETA) 420 mg in sodium chloride 0.9 % 250 mL chemo infusion, 420 mg (100 % of original dose 420 mg), Intravenous, Once, 2 of 6 cycles Dose modification: 420 mg (original dose 420 mg, Cycle 1, Reason: Provider Judgment) Administration: 420 mg (02/13/2019), 420 mg (03/04/2019) fosaprepitant (EMEND) 150 mg, dexamethasone (DECADRON) 12 mg in sodium chloride 0.9 % 145 mL IVPB, , Intravenous,  Once, 2 of 6 cycles Administration:  (02/13/2019),  (03/04/2019) trastuzumab-dkst (OGIVRI) 651 mg in sodium chloride 0.9 % 250 mL chemo infusion, 8 mg/kg = 651 mg, Intravenous,  Once, 2 of 6 cycles Administration: 651 mg (02/13/2019), 483 mg (03/04/2019)  for chemotherapy treatment.     Genetic Testing   Negative genetic testing. No pathogenic variants identified on the [facility] Common Hereditary Cancers Panel. The Common Hereditary Cancers Panel offered by [facility] includes sequencing and/or deletion duplication testing of the following 48 genes: APC, ATM, AXIN2, BARD1, BMPR1A, BRCA1, BRCA2, BRIP1, CDH1, CDKN2A (p14ARF), CDKN2A (p16INK4a), CKD4, CHEK2, CTNNA1, DICER1, EPCAM (Deletion/duplication testing only), GREM1 (promoter region deletion/duplication testing only), KIT, MEN1, MLH1, MSH2, MSH3, MSH6, MUTYH, NBN, NF1, NHTL1, PALB2, PDGFRA, PMS2, POLD1, POLE, PTEN, RAD50, RAD51C, RAD51D, RNF43, SDHB, SDHC, SDHD, SMAD4, SMARCA4. STK11, TP53, TSC1, TSC2, and VHL.  The following genes were evaluated for sequence changes only: SDHA and HOXB13 c.251G>A variant only. The report date is 02/01/2019.      CHIEF COMPLIANT: Cycle 3TCH Perjeta  INTERVAL HISTORY: Paige Perry is a 43 y.o. with above-mentioned history of left breast cancer. She is currently undergoing neoadjuvant chemotherapy with [place] Perjeta.She presents to the clinic todayforcycle 3.  Loose stools lasted 4  to 5 days but they were more manageable this time.  She had a bit of acid reflux and started taking Protonix and is gotten  better.  Denies any nausea or vomiting.  Her taste and appetite are excellent.  REVIEW OF SYSTEMS:   Constitutional: Denies fevers, chills or abnormal weight loss Eyes: Denies blurriness of vision Ears, nose, mouth, throat, and face: Denies mucositis or sore throat Respiratory: Denies cough, dyspnea or wheezes Cardiovascular: Denies palpitation, chest discomfort Gastrointestinal: Denies nausea, heartburn or change in bowel habits Skin: Denies abnormal skin rashes Lymphatics: Denies new lymphadenopathy or easy bruising Neurological: Denies numbness, tingling or new weaknesses Behavioral/Psych: Mood is stable, no new changes  Extremities: No lower extremity edema Breast: denies any pain or lumps or nodules in either breasts All other systems were reviewed with the patient and are negative.  I have reviewed the past medical history, past surgical history, social history and family history with the patient and they are unchanged from previous note.  ALLERGIES:  is allergic to other.  MEDICATIONS:  Current Outpatient Medications  Medication Sig Dispense Refill  . cetirizine (ZYRTEC) 10 MG tablet Take 10 mg by mouth as needed.     Marland Kitchen dexamethasone (DECADRON) 4 MG tablet Take 1 tablet (4 mg total) by mouth daily. Take 1 tablet day before chemo and 1 tablet day after chemo with food 12 tablet 0  . Etonogestrel-Ethinyl Estradiol (NUVARING VA) Place vaginally. Stays in for three weeks then take it out for one week.    Marland Kitchen HYDROcodone-acetaminophen (NORCO/VICODIN) 5-325 MG tablet Take 1 tablet by mouth every 6 (six) hours as needed for moderate pain. (Patient not taking: Reported on 03/04/2019) 15 tablet 0  . ibuprofen (ADVIL,MOTRIN) 400 MG tablet Take 1 tablet (400 mg total) by mouth every 6 (six) hours as needed for pain. (Patient not taking: Reported on 03/04/2019) 30 tablet 0  . lidocaine-prilocaine (EMLA) cream Apply to affected area once 30 g 3  . LORazepam (ATIVAN) 0.5 MG tablet Take 1 tablet  (0.5 mg total) by mouth at bedtime as needed (Nausea or vomiting). 30 tablet 0  . nabumetone (RELAFEN) 500 MG tablet Take 1,000 mg by mouth 2 (two) times daily.    . ondansetron (ZOFRAN) 8 MG tablet Take 1 tablet (8 mg total) by mouth 2 (two) times daily as needed (Nausea or vomiting). Begin 4 days after chemotherapy. 30 tablet 1  . pantoprazole (PROTONIX) 40 MG tablet TAKE 1 TABLET BY MOUTH EVERY DAY 30 tablet 1  . prochlorperazine (COMPAZINE) 10 MG tablet Take 1 tablet (10 mg total) by mouth every 6 (six) hours as needed (Nausea or vomiting). 30 tablet 1  . rosuvastatin (CRESTOR) 20 MG tablet Take 20 mg by mouth daily.     No current facility-administered medications for this visit.    PHYSICAL EXAMINATION: ECOG PERFORMANCE STATUS: 1 - Symptomatic but completely ambulatory  Vitals:   03/26/19 0931  BP: 121/80  Pulse: (!) 103  Resp: 18  Temp: 99.5 F (37.5 C)  SpO2: 100%   Filed Weights   03/26/19 0931  Weight: 185 lb 4.8 oz (84.1 kg)    GENERAL: alert, no distress and comfortable SKIN: skin color, texture, turgor are normal, no rashes or significant lesions EYES: normal, Conjunctiva are pink and non-injected, sclera clear OROPHARYNX: no exudate, no erythema and lips, buccal mucosa, and tongue normal  NECK: supple, thyroid normal size, non-tender, without nodularity LYMPH: no palpable lymphadenopathy in the cervical, axillary or inguinal LUNGS: clear to auscultation and  percussion with normal breathing effort HEART: regular rate & rhythm and no murmurs and no lower extremity edema ABDOMEN: abdomen soft, non-tender and normal bowel sounds MUSCULOSKELETAL: no cyanosis of digits and no clubbing  NEURO: alert & oriented x 3 with fluent speech, no focal motor/sensory deficits EXTREMITIES: No lower extremity edema  LABORATORY DATA:  I have reviewed the data as listed CMP Latest Ref Rng & Units 03/04/2019 02/19/2019 02/13/2019  Glucose 70 - 99 mg/dL 182(H) 128(H) 123(H)  BUN 6 -  20 mg/dL _0 Creatinine 0.44 - 1.00 mg/dL 0.89 0.76 0.82  Sodium 135 - 145 mmol/L 139 136 136  Potassium 3.5 - 5.1 mmol/L 3.8 4.0 4.1  Chloride 98 - 111 mmol/L 105 103 103  CO2 22 - 32 mmol/L 22 24 21(L)  Calcium 8.9 - 10.3 mg/dL 9.2 8.9 9.4  Total Protein 6.5 - 8.1 g/dL 7.2 7.3 8.1  Total Bilirubin 0.3 - 1.2 mg/dL 0.3 0.3 0.4  Alkaline Phos 38 - 126 U/L 80 87 81  AST 15 - 41 U/L _1 ALT 0 - 44 U/L 18 32 18    Lab Results  Component Value Date   WBC 9.8 03/26/2019   HGB 10.6 (L) 03/26/2019   HCT 31.8 (L) 03/26/2019   MCV 91.4 03/26/2019   PLT 303 03/26/2019   NEUTROABS 8.5 (H) 03/26/2019    ASSESSMENT & PLAN:  Malignant neoplasm of upper-outer quadrant of left breast in female, estrogen receptor positive ([place]) 01/18/2019:Patient palpated a left breast lump for 2 weeks. Mammogram showed a 2.3cm left breast mass at the 12 o'clock position, with 1 enlarged left axillary lymph node measuring 3.5cm. 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 Neoadjuvant chemotherapy with TCH Perjeta 6 cycles followed by Herceptin maintenance for 1 year 2. Followed by breast conserving surgery if possible with sentinel lymph node study 3. Followed by adjuvant radiation therapy if patient had lumpectomy No adverse effects from participation in the neuropathy clinical trial SWOG 1714 --------------------------------------------------------------------------------------------------------------------------------------- Current treatment: Cycle 3TCH Perjeta Echocardiogram 01/29/2019: EF 60 to 65%  Chemo toxicities: 1.Loose stools: She is taking Imodium and is gotten better 2.Acid reflux: Protonix 3.  Chemotherapy-induced anemia: Being observed  Monitoring closely for toxicities. Return to clinic in 3 weeks for cycle 4    No orders of the defined types were placed in this encounter.  The patient  has a good understanding of the overall plan. she agrees with it. she will call with any problems that may develop before the next visit here.  Nicholas Lose, MD 03/26/2019  Julious Oka Dorshimer, am acting as scribe for Dr. Nicholas Lose.  I have reviewed the above document for accuracy and completeness, and I agree with the above.

## 2019-03-26 ENCOUNTER — Inpatient Hospital Stay: Payer: BC Managed Care – PPO

## 2019-03-26 ENCOUNTER — Other Ambulatory Visit: Payer: Self-pay

## 2019-03-26 ENCOUNTER — Inpatient Hospital Stay (HOSPITAL_BASED_OUTPATIENT_CLINIC_OR_DEPARTMENT_OTHER): Payer: BC Managed Care – PPO | Admitting: Hematology and Oncology

## 2019-03-26 VITALS — BP 109/73 | HR 101 | Temp 99.5°F | Resp 15

## 2019-03-26 DIAGNOSIS — C50412 Malignant neoplasm of upper-outer quadrant of left female breast: Secondary | ICD-10-CM

## 2019-03-26 DIAGNOSIS — Z170 Estrogen receptor positive status [ER+]: Secondary | ICD-10-CM | POA: Diagnosis not present

## 2019-03-26 DIAGNOSIS — Z95828 Presence of other vascular implants and grafts: Secondary | ICD-10-CM | POA: Insufficient documentation

## 2019-03-26 DIAGNOSIS — Z5189 Encounter for other specified aftercare: Secondary | ICD-10-CM | POA: Diagnosis not present

## 2019-03-26 LAB — CBC WITH DIFFERENTIAL (CANCER CENTER ONLY)
Abs Immature Granulocytes: 0.02 10*3/uL (ref 0.00–0.07)
Basophils Absolute: 0 10*3/uL (ref 0.0–0.1)
Basophils Relative: 0 %
Eosinophils Absolute: 0 10*3/uL (ref 0.0–0.5)
Eosinophils Relative: 0 %
HCT: 31.8 % — ABNORMAL LOW (ref 36.0–46.0)
Hemoglobin: 10.6 g/dL — ABNORMAL LOW (ref 12.0–15.0)
Immature Granulocytes: 0 %
Lymphocytes Relative: 10 %
Lymphs Abs: 1 10*3/uL (ref 0.7–4.0)
MCH: 30.5 pg (ref 26.0–34.0)
MCHC: 33.3 g/dL (ref 30.0–36.0)
MCV: 91.4 fL (ref 80.0–100.0)
Monocytes Absolute: 0.3 10*3/uL (ref 0.1–1.0)
Monocytes Relative: 3 %
Neutro Abs: 8.5 10*3/uL — ABNORMAL HIGH (ref 1.7–7.7)
Neutrophils Relative %: 87 %
Platelet Count: 303 10*3/uL (ref 150–400)
RBC: 3.48 MIL/uL — ABNORMAL LOW (ref 3.87–5.11)
RDW: 14.2 % (ref 11.5–15.5)
WBC Count: 9.8 10*3/uL (ref 4.0–10.5)
nRBC: 0 % (ref 0.0–0.2)

## 2019-03-26 LAB — CMP (CANCER CENTER ONLY)
ALT: 17 U/L (ref 0–44)
AST: 13 U/L — ABNORMAL LOW (ref 15–41)
Albumin: 3.3 g/dL — ABNORMAL LOW (ref 3.5–5.0)
Alkaline Phosphatase: 87 U/L (ref 38–126)
Anion gap: 11 (ref 5–15)
BUN: 13 mg/dL (ref 6–20)
CO2: 20 mmol/L — ABNORMAL LOW (ref 22–32)
Calcium: 8.7 mg/dL — ABNORMAL LOW (ref 8.9–10.3)
Chloride: 107 mmol/L (ref 98–111)
Creatinine: 0.82 mg/dL (ref 0.44–1.00)
GFR, Est AFR Am: >60 mL/min (ref >60)
GFR, Estimated: >60 mL/min (ref >60)
Glucose, Bld: 186 mg/dL — ABNORMAL HIGH (ref 70–99)
Potassium: 3.7 mmol/L (ref 3.5–5.1)
Sodium: 138 mmol/L (ref 135–145)
Total Bilirubin: 0.3 mg/dL (ref 0.3–1.2)
Total Protein: 7.4 g/dL (ref 6.5–8.1)

## 2019-03-26 LAB — PREGNANCY, URINE: Preg Test, Ur: NEGATIVE

## 2019-03-26 MED ORDER — SODIUM CHLORIDE 0.9 % IV SOLN
700.0000 mg | Freq: Once | INTRAVENOUS | Status: AC
  Administered 2019-03-26: 700 mg via INTRAVENOUS
  Filled 2019-03-26: qty 70

## 2019-03-26 MED ORDER — SODIUM CHLORIDE 0.9 % IV SOLN
75.0000 mg/m2 | Freq: Once | INTRAVENOUS | Status: AC
  Administered 2019-03-26: 14:00:00 140 mg via INTRAVENOUS
  Filled 2019-03-26: qty 14

## 2019-03-26 MED ORDER — TRASTUZUMAB-DKST CHEMO 150 MG IV SOLR
6.0000 mg/kg | Freq: Once | INTRAVENOUS | Status: AC
  Administered 2019-03-26: 483 mg via INTRAVENOUS
  Filled 2019-03-26: qty 23

## 2019-03-26 MED ORDER — DIPHENHYDRAMINE HCL 25 MG PO CAPS
ORAL_CAPSULE | ORAL | Status: AC
  Filled 2019-03-26: qty 2

## 2019-03-26 MED ORDER — PALONOSETRON HCL INJECTION 0.25 MG/5ML
INTRAVENOUS | Status: AC
  Filled 2019-03-26: qty 5

## 2019-03-26 MED ORDER — ACETAMINOPHEN 325 MG PO TABS
ORAL_TABLET | ORAL | Status: AC
  Filled 2019-03-26: qty 2

## 2019-03-26 MED ORDER — SODIUM CHLORIDE 0.9% FLUSH
10.0000 mL | Freq: Once | INTRAVENOUS | Status: AC
  Administered 2019-03-26: 09:00:00 10 mL
  Filled 2019-03-26: qty 10

## 2019-03-26 MED ORDER — SODIUM CHLORIDE 0.9 % IV SOLN
Freq: Once | INTRAVENOUS | Status: AC
  Filled 2019-03-26: qty 250

## 2019-03-26 MED ORDER — SODIUM CHLORIDE 0.9 % IV SOLN
Freq: Once | INTRAVENOUS | Status: AC
  Filled 2019-03-26: qty 5

## 2019-03-26 MED ORDER — HEPARIN SOD (PORK) LOCK FLUSH 100 UNIT/ML IV SOLN
500.0000 [IU] | Freq: Once | INTRAVENOUS | Status: AC | PRN
  Administered 2019-03-26: 15:00:00 500 [IU]
  Filled 2019-03-26: qty 5

## 2019-03-26 MED ORDER — DIPHENHYDRAMINE HCL 25 MG PO CAPS
50.0000 mg | ORAL_CAPSULE | Freq: Once | ORAL | Status: AC
  Administered 2019-03-26: 10:00:00 50 mg via ORAL

## 2019-03-26 MED ORDER — SODIUM CHLORIDE 0.9 % IV SOLN
420.0000 mg | Freq: Once | INTRAVENOUS | Status: AC
  Administered 2019-03-26: 420 mg via INTRAVENOUS
  Filled 2019-03-26: qty 14

## 2019-03-26 MED ORDER — SODIUM CHLORIDE 0.9% FLUSH
10.0000 mL | INTRAVENOUS | Status: DC | PRN
  Administered 2019-03-26: 10 mL
  Filled 2019-03-26: qty 10

## 2019-03-26 MED ORDER — ACETAMINOPHEN 325 MG PO TABS
650.0000 mg | ORAL_TABLET | Freq: Once | ORAL | Status: AC
  Administered 2019-03-26: 650 mg via ORAL

## 2019-03-26 MED ORDER — PALONOSETRON HCL INJECTION 0.25 MG/5ML
0.2500 mg | Freq: Once | INTRAVENOUS | Status: AC
  Administered 2019-03-26: 0.25 mg via INTRAVENOUS

## 2019-03-26 NOTE — Assessment & Plan Note (Signed)
01/18/2019:Patient palpated a left breast lump for 2 weeks. Mammogram showed a 2.3cm left breast mass at the 12 o'clock position, with 1 enlarged left axillary lymph node measuring 3.5cm. 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 Neoadjuvant chemotherapy with TCH Perjeta 6 cycles followed by Herceptin maintenance for 1 year 2. Followed by breast conserving surgery if possible with sentinel lymph node study 3. Followed by adjuvant radiation therapy if patient had lumpectomy No adverse effects from participation in the neuropathy clinical trial SWOG 1714 --------------------------------------------------------------------------------------------------------------------------------------- Current treatment: Cycle 3TCH Perjeta Echocardiogram 01/29/2019: EF 60 to 65%  Chemo toxicities: 1.Loose stools: Patient had full-blown diarrhea for 24 hours and it subsided.  She did take Imodium which helped. 2.Change in taste 3.Acid reflux: Protonix 4.  Chemotherapy-induced anemia: Being observed  Monitoring closely for toxicities. Return to clinic in 3 weeks for cycle 4

## 2019-03-26 NOTE — Patient Instructions (Signed)
[facility] Discharge Instructions for Patients Receiving Chemotherapy  Today you received the following chemotherapy agents Trastuzumab (OGIVRI), Pertuzumab (PERJETA), Docetaxel (TAXOTERE) & Carboplatin (PARAPLATIN).  To help prevent nausea and vomiting after your treatment, we encourage you to take your nausea medication as prescribed.   If you develop nausea and vomiting that is not controlled by your nausea medication, call the clinic.   BELOW ARE SYMPTOMS THAT SHOULD BE REPORTED IMMEDIATELY:  *FEVER GREATER THAN 100.5 F  *CHILLS WITH OR WITHOUT FEVER  NAUSEA AND VOMITING THAT IS NOT CONTROLLED WITH YOUR NAUSEA MEDICATION  *UNUSUAL SHORTNESS OF BREATH  *UNUSUAL BRUISING OR BLEEDING  TENDERNESS IN MOUTH AND THROAT WITH OR WITHOUT PRESENCE OF ULCERS  *URINARY PROBLEMS  *BOWEL PROBLEMS  UNUSUAL RASH Items with * indicate a potential emergency and should be followed up as soon as possible.  Feel free to call the clinic should you have any questions or concerns. The clinic phone number is [phone].  Please show the [facility] at check-in to the Emergency Department and triage nurse.  Coronavirus (COVID-19) Are you at risk?  Are you at risk for the Coronavirus (COVID-19)?  To be considered HIGH RISK for Coronavirus (COVID-19), you have to meet the following criteria:  . Traveled to [place], [place], [place], [place] or [place]; or in the [place] to [place], [place], [place], or [place]; and have fever, cough, and shortness of breath within the last 2 weeks of travel OR . Been in close contact with a person diagnosed with COVID-19 within the last 2 weeks and have fever, cough, and shortness of breath . IF YOU DO NOT MEET THESE CRITERIA, YOU ARE CONSIDERED LOW RISK FOR COVID-19.  What to do if you are HIGH RISK for COVID-19?  Marland Kitchen If you are having a medical emergency, call 911. . Seek medical care right away. Before you go to a doctor's  office, urgent care or emergency department, call ahead and tell them about your recent travel, contact with someone diagnosed with COVID-19, and your symptoms. You should receive instructions from your physician's office regarding next steps of care.  . When you arrive at healthcare provider, tell the healthcare staff immediately you have returned from visiting [place], [place], [place], [place] or [place]; or traveled in the [place] to [place], [place], [place], or [place]; in the last two weeks or you have been in close contact with a person diagnosed with COVID-19 in the last 2 weeks.   . Tell the health care staff about your symptoms: fever, cough and shortness of breath. . After you have been seen by a medical provider, you will be either: o Tested for (COVID-19) and discharged home on quarantine except to seek medical care if symptoms worsen, and asked to  - Stay home and avoid contact with others until you get your results (4-5 days)  - Avoid travel on public transportation if possible (such as bus, train, or airplane) or o Sent to the Emergency Department by EMS for evaluation, COVID-19 testing, and possible admission depending on your condition and test results.  What to do if you are LOW RISK for COVID-19?  Reduce your risk of any infection by using the same precautions used for avoiding the common cold or flu:  Marland Kitchen Wash your hands often with soap and warm water for at least 20 seconds.  If soap and water are not readily available, use an alcohol-based hand sanitizer with at least  60% alcohol.  . If coughing or sneezing, cover your mouth and nose by coughing or sneezing into the elbow areas of your shirt or coat, into a tissue or into your sleeve (not your hands). . Avoid shaking hands with others and consider head nods or verbal greetings only. . Avoid touching your eyes, nose, or mouth with unwashed hands.  . Avoid close contact with people who are sick. . Avoid places or  events with large numbers of people in one location, like concerts or sporting events. . Carefully consider travel plans you have or are making. . If you are planning any travel outside or inside the [place], visit the CDC's Travelers' Health webpage for the latest health notices. . If you have some symptoms but not all symptoms, continue to monitor at home and seek medical attention if your symptoms worsen. . If you are having a medical emergency, call 911.   [place] / e-Visit: [email or web address]         [facility]: [facility]: [phone]                   [facility]: [phone]

## 2019-03-28 ENCOUNTER — Telehealth: Payer: Self-pay | Admitting: Hematology and Oncology

## 2019-03-28 ENCOUNTER — Inpatient Hospital Stay: Payer: BC Managed Care – PPO

## 2019-03-28 ENCOUNTER — Other Ambulatory Visit: Payer: Self-pay

## 2019-03-28 VITALS — BP 118/73 | HR 88 | Temp 99.1°F | Resp 16

## 2019-03-28 DIAGNOSIS — Z5189 Encounter for other specified aftercare: Secondary | ICD-10-CM | POA: Diagnosis not present

## 2019-03-28 DIAGNOSIS — Z170 Estrogen receptor positive status [ER+]: Secondary | ICD-10-CM

## 2019-03-28 DIAGNOSIS — C50412 Malignant neoplasm of upper-outer quadrant of left female breast: Secondary | ICD-10-CM

## 2019-03-28 MED ORDER — PEGFILGRASTIM-JMDB 6 MG/0.6ML ~~LOC~~ SOSY
PREFILLED_SYRINGE | SUBCUTANEOUS | Status: AC
  Filled 2019-03-28: qty 0.6

## 2019-03-28 MED ORDER — PEGFILGRASTIM-JMDB 6 MG/0.6ML ~~LOC~~ SOSY
6.0000 mg | PREFILLED_SYRINGE | Freq: Once | SUBCUTANEOUS | Status: AC
  Administered 2019-03-28: 6 mg via SUBCUTANEOUS

## 2019-03-28 NOTE — Telephone Encounter (Signed)
Scheduled per 12/29 los, patient has been called and notified.  

## 2019-04-01 ENCOUNTER — Encounter: Payer: Self-pay | Admitting: *Deleted

## 2019-04-03 ENCOUNTER — Telehealth: Payer: Self-pay | Admitting: Hematology and Oncology

## 2019-04-03 NOTE — Telephone Encounter (Signed)
Received a message from pathology that her tumor was reevaluated and was felt to be 2+ by IHC and therefore FISH was done which was negative. Another specimen is being tested currently and we will know this result soon. We are treating her for HER-2 positivity and if it is heterogeneous HER-2 amplification then she may not respond adequately to her treatment. 

## 2019-04-08 ENCOUNTER — Other Ambulatory Visit: Payer: Self-pay | Admitting: Hematology and Oncology

## 2019-04-17 ENCOUNTER — Encounter: Payer: Self-pay | Admitting: *Deleted

## 2019-04-17 NOTE — Progress Notes (Signed)
Patient Care Team: [name], NP as PCP - General Mauro Kaufmann, RN as Oncology Nurse Navigator Rockwell Germany, RN as Oncology Nurse Navigator Donnie Mesa, MD as Consulting Physician (General Surgery) Nicholas Lose, MD as Consulting Physician (Hematology and Oncology) Kyung Rudd, MD as Consulting Physician (Radiation Oncology)  DIAGNOSIS:    ICD-10-CM   1. Malignant neoplasm of upper-outer quadrant of left breast in female, estrogen receptor positive ([place])  C50.412    Z17.0     SUMMARY OF ONCOLOGIC HISTORY: Oncology History  Malignant neoplasm of upper-outer quadrant of left breast in female, estrogen receptor positive ([place])  01/18/2019 Initial Diagnosis   Patient palpated a left breast lump for 2 weeks. Mammogram showed a 2.3cm left breast mass at the 12 o'clock position, with 1 enlarged left axillary lymph node measuring 3.5cm. 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   01/23/2019 Cancer Staging   Staging form: Breast, AJCC 8th Edition - Clinical stage from 01/23/2019: Stage IIA (cT2, cN1(f), cM0, G2, ER+, PR-, HER2+) - Signed by Nicholas Lose, MD on 01/23/2019   02/13/2019 -  Chemotherapy   The patient had palonosetron (ALOXI) injection 0.25 mg, 0.25 mg, Intravenous,  Once, 3 of 6 cycles Administration: 0.25 mg (02/13/2019), 0.25 mg (03/04/2019), 0.25 mg (03/26/2019) pegfilgrastim-jmdb (FULPHILA) injection 6 mg, 6 mg, Subcutaneous,  Once, 3 of 6 cycles Administration: 6 mg (02/15/2019), 6 mg (03/06/2019), 6 mg (03/28/2019) CARBOplatin (PARAPLATIN) 700 mg in sodium chloride 0.9 % 250 mL chemo infusion, 700 mg (100 % of original dose 700 mg), Intravenous,  Once, 3 of 6 cycles Dose modification: 700 mg (original dose 700 mg, Cycle 1) Administration: 700 mg (02/13/2019), 700 mg (03/04/2019), 700 mg (03/26/2019) DOCEtaxel (TAXOTERE) 140 mg in sodium chloride 0.9 % 250 mL chemo infusion, 75 mg/m2 = 140 mg, Intravenous,  Once, 3 of 6  cycles Administration: 140 mg (02/13/2019), 140 mg (03/04/2019), 140 mg (03/26/2019) pertuzumab (PERJETA) 420 mg in sodium chloride 0.9 % 250 mL chemo infusion, 420 mg (100 % of original dose 420 mg), Intravenous, Once, 3 of 6 cycles Dose modification: 420 mg (original dose 420 mg, Cycle 1, Reason: Provider Judgment) Administration: 420 mg (02/13/2019), 420 mg (03/04/2019), 420 mg (03/26/2019) fosaprepitant (EMEND) 150 mg, dexamethasone (DECADRON) 12 mg in sodium chloride 0.9 % 145 mL IVPB, , Intravenous,  Once, 3 of 6 cycles Administration:  (02/13/2019),  (03/04/2019),  (03/26/2019) trastuzumab-dkst (OGIVRI) 651 mg in sodium chloride 0.9 % 250 mL chemo infusion, 8 mg/kg = 651 mg, Intravenous,  Once, 3 of 6 cycles Administration: 651 mg (02/13/2019), 483 mg (03/04/2019), 483 mg (03/26/2019)  for chemotherapy treatment.     Genetic Testing   Negative genetic testing. No pathogenic variants identified on the [facility] Common Hereditary Cancers Panel. The Common Hereditary Cancers Panel offered by [facility] includes sequencing and/or deletion duplication testing of the following 48 genes: APC, ATM, AXIN2, BARD1, BMPR1A, BRCA1, BRCA2, BRIP1, CDH1, CDKN2A (p14ARF), CDKN2A (p16INK4a), CKD4, CHEK2, CTNNA1, DICER1, EPCAM (Deletion/duplication testing only), GREM1 (promoter region deletion/duplication testing only), KIT, MEN1, MLH1, MSH2, MSH3, MSH6, MUTYH, NBN, NF1, NHTL1, PALB2, PDGFRA, PMS2, POLD1, POLE, PTEN, RAD50, RAD51C, RAD51D, RNF43, SDHB, SDHC, SDHD, SMAD4, SMARCA4. STK11, TP53, TSC1, TSC2, and VHL.  The following genes were evaluated for sequence changes only: SDHA and HOXB13 c.251G>A variant only. The report date is 02/01/2019.      CHIEF COMPLIANT: Cycle4TCH Perjeta  INTERVAL HISTORY: Paige Perry is a 44 y.o. with above-mentioned history of left breast cancer. She  is currently undergoing neoadjuvant chemotherapy with TCH Perjeta.Her biopsy was reevaluated by pathology and felt to be 2+ by IHC.  FISH testing revealed it was HER-2 negative. She presents to the clinic todayforfollow-up.  Patient is experiencing the left breast pain and discomfort especially in the axilla going into the upper outer aspect of the left breast.  This has been going on for about a week.  She also feels extremely tired especially getting up and down the stairs or even some basic activities throughout the day.  Denies any neuropathy but there is darkening of the skin around the hands as well as the nailbeds.  ALLERGIES:  is allergic to other.  MEDICATIONS:  Current Outpatient Medications  Medication Sig Dispense Refill  . cetirizine (ZYRTEC) 10 MG tablet Take 10 mg by mouth as needed.     Marland Kitchen dexamethasone (DECADRON) 4 MG tablet Take 1 tablet (4 mg total) by mouth daily. Take 1 tablet day before chemo and 1 tablet day after chemo with food 12 tablet 0  . Etonogestrel-Ethinyl Estradiol (NUVARING VA) Place vaginally. Stays in for three weeks then take it out for one week.    Marland Kitchen HYDROcodone-acetaminophen (NORCO/VICODIN) 5-325 MG tablet Take 1 tablet by mouth every 6 (six) hours as needed for moderate pain. (Patient not taking: Reported on 03/04/2019) 15 tablet 0  . ibuprofen (ADVIL,MOTRIN) 400 MG tablet Take 1 tablet (400 mg total) by mouth every 6 (six) hours as needed for pain. (Patient not taking: Reported on 03/04/2019) 30 tablet 0  . lidocaine-prilocaine (EMLA) cream Apply to affected area once 30 g 3  . LORazepam (ATIVAN) 0.5 MG tablet Take 1 tablet (0.5 mg total) by mouth at bedtime as needed (Nausea or vomiting). 30 tablet 0  . nabumetone (RELAFEN) 500 MG tablet Take 1,000 mg by mouth 2 (two) times daily.    . ondansetron (ZOFRAN) 8 MG tablet Take 1 tablet (8 mg total) by mouth 2 (two) times daily as needed (Nausea or vomiting). Begin 4 days after chemotherapy. 30 tablet 1  . pantoprazole (PROTONIX) 40 MG tablet TAKE 1 TABLET BY MOUTH EVERY DAY 90 tablet 1  . prochlorperazine (COMPAZINE) 10 MG tablet Take 1  tablet (10 mg total) by mouth every 6 (six) hours as needed (Nausea or vomiting). 30 tablet 1  . rosuvastatin (CRESTOR) 20 MG tablet Take 20 mg by mouth daily.     No current facility-administered medications for this visit.    PHYSICAL EXAMINATION: ECOG PERFORMANCE STATUS: 1 - Symptomatic but completely ambulatory  Vitals:   04/18/19 0837  BP: 124/82  Pulse: 91  Resp: 20  Temp: 97.7 F (36.5 C)  SpO2: 100%   Filed Weights   04/18/19 0837  Weight: 189 lb 11.2 oz (86 kg)    LABORATORY DATA:  I have reviewed the data as listed CMP Latest Ref Rng & Units 04/18/2019 03/26/2019 03/04/2019  Glucose 70 - 99 mg/dL 114(H) 186(H) 182(H)  BUN 6 - 20 mg/dL 13 13 12   Creatinine 0.44 - 1.00 mg/dL 0.78 0.82 0.89  Sodium 135 - 145 mmol/L 139 138 139  Potassium 3.5 - 5.1 mmol/L 3.6 3.7 3.8  Chloride 98 - 111 mmol/L 109 107 105  CO2 22 - 32 mmol/L 22 20(L) 22  Calcium 8.9 - 10.3 mg/dL 8.9 8.7(L) 9.2  Total Protein 6.5 - 8.1 g/dL 7.0 7.4 7.2  Total Bilirubin 0.3 - 1.2 mg/dL 0.3 0.3 0.3  Alkaline Phos 38 - 126 U/L 78 87 80  AST 15 - 41  U/L 15 13(L) 15  ALT 0 - 44 U/L 17 17 18     Lab Results  Component Value Date   WBC 11.5 (H) 04/18/2019   HGB 9.9 (L) 04/18/2019   HCT 29.2 (L) 04/18/2019   MCV 91.8 04/18/2019   PLT 270 04/18/2019   NEUTROABS 8.5 (H) 04/18/2019    ASSESSMENT & PLAN:  Malignant neoplasm of upper-outer quadrant of left breast in female, estrogen receptor positive ([place]) 01/18/2019:Patient palpated a left breast lump for 2 weeks. Mammogram showed a 2.3cm left breast mass at the 12 o'clock position, with 1 enlarged left axillary lymph node measuring 3.5cm. 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 Neoadjuvant chemotherapy with TCH Perjeta 6 cycles followed by Herceptin maintenance for 1 year 2. Followed by breast conserving surgery if possible with sentinel lymph node study 3. Followed by  adjuvant radiation therapy if patient had lumpectomy No adverse effects from participation in the neuropathy clinical trial SWOG 1714 --------------------------------------------------------------------------------------------------------------------------------------- Pathology work-up regarding her-2: The initial immunohistochemistry was 3+ HER-2 positive breast cancer.  Accidentally FISH was ordered which came back negative for HER-2.  This led to additional FISH test on the lymph node which was also HER-2 negative.  They went back and looked at the immunohistochemistry of the breast biopsy and it was reclassified as 2+ by IHC.  Based on all of this work-up it turns out that the HER-2 is truly negative.  Treatment plan change: Based on negative HER-2 results, I recommended that we discontinue Perjeta. After lengthy discussion we discussed different options and decided to drop the Perjeta but continue with the [facility].  Pain in the left breast: If the pain persist for over a week then we may have to get a mammogram and ultrasound.  To my physical exam there is no palpable nodularity or concern.  Chemo toxicities: 1.  Diarrhea: Improved with Imodium. 2.  Chemo induced anemia: Being monitored, today's hemoglobin is 9.9. 3.  Alopecia 4.  Severe fatigue  We are discontinuing Perjeta with this treatment but will continue the Herceptin with the hope that even if she was weekly HER-2 positive that she might get some benefit from adding Herceptin. If on the final path she is HER-2 completely negative then we may decide to not continue with the maintenance Herceptin.  Return to clinic in 3 weeks for cycle 5    No orders of the defined types were placed in this encounter.  The patient has a good understanding of the overall plan. she agrees with it. she will call with any problems that may develop before the next visit here.  Total time spent: 30 mins including face to face time and time spent for  planning, charting and coordination of care  Nicholas Lose, MD 04/18/2019  I, Cloyde Reams Dorshimer, am acting as scribe for Dr. Nicholas Lose.  I have reviewed the above documentation for accuracy and completeness, and I agree with the above.

## 2019-04-18 ENCOUNTER — Other Ambulatory Visit: Payer: Self-pay

## 2019-04-18 ENCOUNTER — Inpatient Hospital Stay: Payer: BC Managed Care – PPO

## 2019-04-18 ENCOUNTER — Inpatient Hospital Stay: Payer: BC Managed Care – PPO | Attending: Hematology and Oncology

## 2019-04-18 ENCOUNTER — Inpatient Hospital Stay (HOSPITAL_BASED_OUTPATIENT_CLINIC_OR_DEPARTMENT_OTHER): Payer: BC Managed Care – PPO | Admitting: Hematology and Oncology

## 2019-04-18 DIAGNOSIS — Z170 Estrogen receptor positive status [ER+]: Secondary | ICD-10-CM

## 2019-04-18 DIAGNOSIS — Z79899 Other long term (current) drug therapy: Secondary | ICD-10-CM | POA: Insufficient documentation

## 2019-04-18 DIAGNOSIS — C773 Secondary and unspecified malignant neoplasm of axilla and upper limb lymph nodes: Secondary | ICD-10-CM | POA: Diagnosis not present

## 2019-04-18 DIAGNOSIS — Z5112 Encounter for antineoplastic immunotherapy: Secondary | ICD-10-CM | POA: Diagnosis present

## 2019-04-18 DIAGNOSIS — Z793 Long term (current) use of hormonal contraceptives: Secondary | ICD-10-CM | POA: Insufficient documentation

## 2019-04-18 DIAGNOSIS — Z95828 Presence of other vascular implants and grafts: Secondary | ICD-10-CM

## 2019-04-18 DIAGNOSIS — Z7952 Long term (current) use of systemic steroids: Secondary | ICD-10-CM | POA: Insufficient documentation

## 2019-04-18 DIAGNOSIS — Z5189 Encounter for other specified aftercare: Secondary | ICD-10-CM | POA: Diagnosis present

## 2019-04-18 DIAGNOSIS — C50412 Malignant neoplasm of upper-outer quadrant of left female breast: Secondary | ICD-10-CM | POA: Insufficient documentation

## 2019-04-18 DIAGNOSIS — Z5111 Encounter for antineoplastic chemotherapy: Secondary | ICD-10-CM | POA: Insufficient documentation

## 2019-04-18 DIAGNOSIS — E785 Hyperlipidemia, unspecified: Secondary | ICD-10-CM | POA: Diagnosis not present

## 2019-04-18 DIAGNOSIS — Z791 Long term (current) use of non-steroidal anti-inflammatories (NSAID): Secondary | ICD-10-CM | POA: Insufficient documentation

## 2019-04-18 LAB — CMP (CANCER CENTER ONLY)
ALT: 17 U/L (ref 0–44)
AST: 15 U/L (ref 15–41)
Albumin: 3.3 g/dL — ABNORMAL LOW (ref 3.5–5.0)
Alkaline Phosphatase: 78 U/L (ref 38–126)
Anion gap: 8 (ref 5–15)
BUN: 13 mg/dL (ref 6–20)
CO2: 22 mmol/L (ref 22–32)
Calcium: 8.9 mg/dL (ref 8.9–10.3)
Chloride: 109 mmol/L (ref 98–111)
Creatinine: 0.78 mg/dL (ref 0.44–1.00)
GFR, Est AFR Am: >60 mL/min (ref >60)
GFR, Estimated: >60 mL/min (ref >60)
Glucose, Bld: 114 mg/dL — ABNORMAL HIGH (ref 70–99)
Potassium: 3.6 mmol/L (ref 3.5–5.1)
Sodium: 139 mmol/L (ref 135–145)
Total Bilirubin: 0.3 mg/dL (ref 0.3–1.2)
Total Protein: 7 g/dL (ref 6.5–8.1)

## 2019-04-18 LAB — CBC WITH DIFFERENTIAL (CANCER CENTER ONLY)
Abs Immature Granulocytes: 0.04 10*3/uL (ref 0.00–0.07)
Basophils Absolute: 0 10*3/uL (ref 0.0–0.1)
Basophils Relative: 0 %
Eosinophils Absolute: 0 10*3/uL (ref 0.0–0.5)
Eosinophils Relative: 0 %
HCT: 29.2 % — ABNORMAL LOW (ref 36.0–46.0)
Hemoglobin: 9.9 g/dL — ABNORMAL LOW (ref 12.0–15.0)
Immature Granulocytes: 0 %
Lymphocytes Relative: 17 %
Lymphs Abs: 1.9 10*3/uL (ref 0.7–4.0)
MCH: 31.1 pg (ref 26.0–34.0)
MCHC: 33.9 g/dL (ref 30.0–36.0)
MCV: 91.8 fL (ref 80.0–100.0)
Monocytes Absolute: 1.1 10*3/uL — ABNORMAL HIGH (ref 0.1–1.0)
Monocytes Relative: 9 %
Neutro Abs: 8.5 10*3/uL — ABNORMAL HIGH (ref 1.7–7.7)
Neutrophils Relative %: 74 %
Platelet Count: 270 10*3/uL (ref 150–400)
RBC: 3.18 MIL/uL — ABNORMAL LOW (ref 3.87–5.11)
RDW: 15.9 % — ABNORMAL HIGH (ref 11.5–15.5)
WBC Count: 11.5 10*3/uL — ABNORMAL HIGH (ref 4.0–10.5)
nRBC: 0 % (ref 0.0–0.2)

## 2019-04-18 LAB — PREGNANCY, URINE: Preg Test, Ur: NEGATIVE

## 2019-04-18 MED ORDER — SODIUM CHLORIDE 0.9 % IV SOLN
75.0000 mg/m2 | Freq: Once | INTRAVENOUS | Status: AC
  Administered 2019-04-18: 140 mg via INTRAVENOUS
  Filled 2019-04-18: qty 14

## 2019-04-18 MED ORDER — HEPARIN SOD (PORK) LOCK FLUSH 100 UNIT/ML IV SOLN
500.0000 [IU] | Freq: Once | INTRAVENOUS | Status: AC | PRN
  Administered 2019-04-18: 500 [IU]
  Filled 2019-04-18: qty 5

## 2019-04-18 MED ORDER — SODIUM CHLORIDE 0.9 % IV SOLN
700.0000 mg | Freq: Once | INTRAVENOUS | Status: AC
  Administered 2019-04-18: 700 mg via INTRAVENOUS
  Filled 2019-04-18: qty 70

## 2019-04-18 MED ORDER — DIPHENHYDRAMINE HCL 50 MG/ML IJ SOLN
INTRAMUSCULAR | Status: AC
  Filled 2019-04-18: qty 1

## 2019-04-18 MED ORDER — ACETAMINOPHEN 325 MG PO TABS
ORAL_TABLET | ORAL | Status: AC
  Filled 2019-04-18: qty 2

## 2019-04-18 MED ORDER — DIPHENHYDRAMINE HCL 25 MG PO CAPS
50.0000 mg | ORAL_CAPSULE | Freq: Once | ORAL | Status: AC
  Administered 2019-04-18: 11:00:00 50 mg via ORAL

## 2019-04-18 MED ORDER — PALONOSETRON HCL INJECTION 0.25 MG/5ML
0.2500 mg | Freq: Once | INTRAVENOUS | Status: AC
  Administered 2019-04-18: 0.25 mg via INTRAVENOUS

## 2019-04-18 MED ORDER — SODIUM CHLORIDE 0.9% FLUSH
10.0000 mL | Freq: Once | INTRAVENOUS | Status: AC
  Administered 2019-04-18: 10 mL
  Filled 2019-04-18: qty 10

## 2019-04-18 MED ORDER — SODIUM CHLORIDE 0.9 % IV SOLN
Freq: Once | INTRAVENOUS | Status: AC
  Filled 2019-04-18: qty 5

## 2019-04-18 MED ORDER — SODIUM CHLORIDE 0.9 % IV SOLN
Freq: Once | INTRAVENOUS | Status: AC
  Filled 2019-04-18: qty 250

## 2019-04-18 MED ORDER — DIPHENHYDRAMINE HCL 25 MG PO CAPS
ORAL_CAPSULE | ORAL | Status: AC
  Filled 2019-04-18: qty 2

## 2019-04-18 MED ORDER — PALONOSETRON HCL INJECTION 0.25 MG/5ML
INTRAVENOUS | Status: AC
  Filled 2019-04-18: qty 5

## 2019-04-18 MED ORDER — TRASTUZUMAB-DKST CHEMO 150 MG IV SOLR
6.0000 mg/kg | Freq: Once | INTRAVENOUS | Status: AC
  Administered 2019-04-18: 12:00:00 483 mg via INTRAVENOUS
  Filled 2019-04-18: qty 23

## 2019-04-18 MED ORDER — ACETAMINOPHEN 325 MG PO TABS
650.0000 mg | ORAL_TABLET | Freq: Once | ORAL | Status: AC
  Administered 2019-04-18: 650 mg via ORAL

## 2019-04-18 MED ORDER — SODIUM CHLORIDE 0.9% FLUSH
10.0000 mL | INTRAVENOUS | Status: DC | PRN
  Administered 2019-04-18: 15:00:00 10 mL
  Filled 2019-04-18: qty 10

## 2019-04-18 NOTE — Patient Instructions (Signed)

## 2019-04-18 NOTE — Assessment & Plan Note (Signed)
01/18/2019:Patient palpated a left breast lump for 2 weeks. Mammogram showed a 2.3cm left breast mass at the 12 o'clock position, with 1 enlarged left axillary lymph node measuring 3.5cm. 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 Neoadjuvant chemotherapy with TCH Perjeta 6 cycles followed by Herceptin maintenance for 1 year 2. Followed by breast conserving surgery if possible with sentinel lymph node study 3. Followed by adjuvant radiation therapy if patient had lumpectomy No adverse effects from participation in the neuropathy clinical trial SWOG 1714 --------------------------------------------------------------------------------------------------------------------------------------- Pathology work-up regarding her-2: The initial immunohistochemistry was 3+ HER-2 positive breast cancer.  Accidentally FISH was ordered which came back negative for HER-2.  This led to additional FISH test on the lymph node which was also HER-2 negative.  They went back and looked at the immunohistochemistry of the breast biopsy and it was reclassified as 2+ by IHC.  Based on all of this work-up it turns out that the HER-2 is truly negative.  Treatment plan change: Based on negative HER-2 results, I recommended that we discontinue Herceptin and Perjeta. Change in treatment to Taxotere and Cytoxan for the next 3 cycles.  This will conclude 6 cycles of Taxotere with 3 cycles of carbo and 3 cycles of Cytoxan.  Chemo toxicities: 1.  Diarrhea: Improved with Imodium. 2.  Chemo induced anemia: Being monitored 3.  Alopecia  Return to clinic in 3 weeks for cycle 5

## 2019-04-18 NOTE — Patient Instructions (Signed)
Paige Perry Discharge Instructions for Patients Receiving Chemotherapy  Today you received the following chemotherapy agents Trastuzumab (OGIVRI), Pertuzumab (PERJETA), Docetaxel (TAXOTERE) & Carboplatin (PARAPLATIN).  To help prevent nausea and vomiting after your treatment, we encourage you to take your nausea medication as prescribed.   If you develop nausea and vomiting that is not controlled by your nausea medication, call the clinic.   BELOW ARE SYMPTOMS THAT SHOULD BE REPORTED IMMEDIATELY:  *FEVER GREATER THAN 100.5 F  *CHILLS WITH OR WITHOUT FEVER  NAUSEA AND VOMITING THAT IS NOT CONTROLLED WITH YOUR NAUSEA MEDICATION  *UNUSUAL SHORTNESS OF BREATH  *UNUSUAL BRUISING OR BLEEDING  TENDERNESS IN MOUTH AND THROAT WITH OR WITHOUT PRESENCE OF ULCERS  *URINARY PROBLEMS  *BOWEL PROBLEMS  UNUSUAL RASH Items with * indicate a potential emergency and should be followed up as soon as possible.  Feel free to call the clinic should you have any questions or concerns. The clinic phone number is [phone].  Please show the [facility] at check-in to the Emergency Department and triage nurse.  Coronavirus (COVID-19) Are you at risk?  Are you at risk for the Coronavirus (COVID-19)?  To be considered HIGH RISK for Coronavirus (COVID-19), you have to meet the following criteria:  . Traveled to [place], [place], [place], [place] or [place]; or in the [place] to [place], [place], [place], or [place]; and have fever, cough, and shortness of breath within the last 2 weeks of travel OR . Been in close contact with a person diagnosed with COVID-19 within the last 2 weeks and have fever, cough, and shortness of breath . IF YOU DO NOT MEET THESE CRITERIA, YOU ARE CONSIDERED LOW RISK FOR COVID-19.  What to do if you are HIGH RISK for COVID-19?  Marland Kitchen If you are having a medical emergency, call 911. . Seek medical care right away. Before you go to a doctor's  office, urgent care or emergency department, call ahead and tell them about your recent travel, contact with someone diagnosed with COVID-19, and your symptoms. You should receive instructions from your physician's office regarding next steps of care.  . When you arrive at healthcare provider, tell the healthcare staff immediately you have returned from visiting [place], [place], [place], [place] or [place]; or traveled in the [place] to [place], [place], [place], or [place]; in the last two weeks or you have been in close contact with a person diagnosed with COVID-19 in the last 2 weeks.   . Tell the health care staff about your symptoms: fever, cough and shortness of breath. . After you have been seen by a medical provider, you will be either: o Tested for (COVID-19) and discharged home on quarantine except to seek medical care if symptoms worsen, and asked to  - Stay home and avoid contact with others until you get your results (4-5 days)  - Avoid travel on public transportation if possible (such as bus, train, or airplane) or o Sent to the Emergency Department by EMS for evaluation, COVID-19 testing, and possible admission depending on your condition and test results.  What to do if you are LOW RISK for COVID-19?  Reduce your risk of any infection by using the same precautions used for avoiding the common cold or flu:  Marland Kitchen Wash your hands often with soap and warm water for at least 20 seconds.  If soap and water are not readily available, use an alcohol-based hand sanitizer with at least  60% alcohol.  . If coughing or sneezing, cover your mouth and nose by coughing or sneezing into the elbow areas of your shirt or coat, into a tissue or into your sleeve (not your hands). . Avoid shaking hands with others and consider head nods or verbal greetings only. . Avoid touching your eyes, nose, or mouth with unwashed hands.  . Avoid close contact with people who are sick. . Avoid places or  events with large numbers of people in one location, like concerts or sporting events. . Carefully consider travel plans you have or are making. . If you are planning any travel outside or inside the [place], visit the CDC's Travelers' Health webpage for the latest health notices. . If you have some symptoms but not all symptoms, continue to monitor at home and seek medical attention if your symptoms worsen. . If you are having a medical emergency, call 911.   [place] / e-Visit: [email or web address]         [facility]: [facility]: [phone]                   [facility]: [phone]

## 2019-04-20 ENCOUNTER — Other Ambulatory Visit: Payer: Self-pay

## 2019-04-20 ENCOUNTER — Inpatient Hospital Stay: Payer: BC Managed Care – PPO

## 2019-04-20 VITALS — BP 128/76 | HR 124 | Temp 99.1°F

## 2019-04-20 DIAGNOSIS — C50412 Malignant neoplasm of upper-outer quadrant of left female breast: Secondary | ICD-10-CM

## 2019-04-20 DIAGNOSIS — Z5189 Encounter for other specified aftercare: Secondary | ICD-10-CM | POA: Diagnosis not present

## 2019-04-20 MED ORDER — PEGFILGRASTIM-JMDB 6 MG/0.6ML ~~LOC~~ SOSY
6.0000 mg | PREFILLED_SYRINGE | Freq: Once | SUBCUTANEOUS | Status: AC
  Administered 2019-04-20: 6 mg via SUBCUTANEOUS

## 2019-05-06 NOTE — Progress Notes (Signed)
Patient Care Team: [name], NP as PCP - General Mauro Kaufmann, RN as Oncology Nurse Navigator Rockwell Germany, RN as Oncology Nurse Navigator Donnie Mesa, MD as Consulting Physician (General Surgery) Nicholas Lose, MD as Consulting Physician (Hematology and Oncology) Kyung Rudd, MD as Consulting Physician (Radiation Oncology)  DIAGNOSIS:    ICD-10-CM   1. Malignant neoplasm of upper-outer quadrant of left breast in female, estrogen receptor positive ([place])  C50.412    Z17.0     SUMMARY OF ONCOLOGIC HISTORY: Oncology History  Malignant neoplasm of upper-outer quadrant of left breast in female, estrogen receptor positive ([place])  01/18/2019 Initial Diagnosis   Patient palpated a left breast lump for 2 weeks. Mammogram showed a 2.3cm left breast mass at the 12 o'clock position, with 1 enlarged left axillary lymph node measuring 3.5cm. 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   01/23/2019 Cancer Staging   Staging form: Breast, AJCC 8th Edition - Clinical stage from 01/23/2019: Stage IIA (cT2, cN1(f), cM0, G2, ER+, PR-, HER2+) - Signed by Nicholas Lose, MD on 01/23/2019   02/13/2019 -  Chemotherapy   The patient had palonosetron (ALOXI) injection 0.25 mg, 0.25 mg, Intravenous,  Once, 4 of 6 cycles Administration: 0.25 mg (02/13/2019), 0.25 mg (03/04/2019), 0.25 mg (03/26/2019), 0.25 mg (04/18/2019) pegfilgrastim-jmdb (FULPHILA) injection 6 mg, 6 mg, Subcutaneous,  Once, 4 of 6 cycles Administration: 6 mg (02/15/2019), 6 mg (03/06/2019), 6 mg (03/28/2019), 6 mg (04/20/2019) CARBOplatin (PARAPLATIN) 700 mg in sodium chloride 0.9 % 250 mL chemo infusion, 700 mg (100 % of original dose 700 mg), Intravenous,  Once, 4 of 6 cycles Dose modification: 700 mg (original dose 700 mg, Cycle 1) Administration: 700 mg (02/13/2019), 700 mg (03/04/2019), 700 mg (03/26/2019), 700 mg (04/18/2019) DOCEtaxel (TAXOTERE) 140 mg in sodium chloride 0.9 % 250 mL chemo  infusion, 75 mg/m2 = 140 mg, Intravenous,  Once, 4 of 6 cycles Administration: 140 mg (02/13/2019), 140 mg (03/04/2019), 140 mg (03/26/2019), 140 mg (04/18/2019) pertuzumab (PERJETA) 420 mg in sodium chloride 0.9 % 250 mL chemo infusion, 420 mg (100 % of original dose 420 mg), Intravenous, Once, 3 of 3 cycles Dose modification: 420 mg (original dose 420 mg, Cycle 1, Reason: Provider Judgment) Administration: 420 mg (02/13/2019), 420 mg (03/04/2019), 420 mg (03/26/2019) fosaprepitant (EMEND) 150 mg, dexamethasone (DECADRON) 12 mg in sodium chloride 0.9 % 145 mL IVPB, , Intravenous,  Once, 4 of 6 cycles Administration:  (02/13/2019),  (03/04/2019),  (03/26/2019),  (04/18/2019) trastuzumab-dkst (OGIVRI) 651 mg in sodium chloride 0.9 % 250 mL chemo infusion, 8 mg/kg = 651 mg, Intravenous,  Once, 4 of 6 cycles Administration: 651 mg (02/13/2019), 483 mg (03/04/2019), 483 mg (03/26/2019), 483 mg (04/18/2019)  for chemotherapy treatment.     Genetic Testing   Negative genetic testing. No pathogenic variants identified on the [facility] Common Hereditary Cancers Panel. The Common Hereditary Cancers Panel offered by [facility] includes sequencing and/or deletion duplication testing of the following 48 genes: APC, ATM, AXIN2, BARD1, BMPR1A, BRCA1, BRCA2, BRIP1, CDH1, CDKN2A (p14ARF), CDKN2A (p16INK4a), CKD4, CHEK2, CTNNA1, DICER1, EPCAM (Deletion/duplication testing only), GREM1 (promoter region deletion/duplication testing only), KIT, MEN1, MLH1, MSH2, MSH3, MSH6, MUTYH, NBN, NF1, NHTL1, PALB2, PDGFRA, PMS2, POLD1, POLE, PTEN, RAD50, RAD51C, RAD51D, RNF43, SDHB, SDHC, SDHD, SMAD4, SMARCA4. STK11, TP53, TSC1, TSC2, and VHL.  The following genes were evaluated for sequence changes only: SDHA and HOXB13 c.251G>A variant only. The report date is 02/01/2019.      CHIEF COMPLIANT: Cycle5TCH  INTERVAL HISTORY: Paige Perry is a 44 y.o. with above-mentioned history of left breast cancer. She is currently undergoing  neoadjuvant chemotherapy with TCH, as Perjeta was discontinued.She presents to the clinic todayforcycle 5.    Overall she feels markedly better.  She has noticed left arm numbness intermittently since her last treatment.  It has not affected her ability to function.  Denies any nausea or vomiting.  Energy levels continue to be low.  Interestingly her hemoglobin today is markedly improved.  ALLERGIES:  is allergic to other.  MEDICATIONS:  Current Outpatient Medications  Medication Sig Dispense Refill  . cetirizine (ZYRTEC) 10 MG tablet Take 10 mg by mouth as needed.     Marland Kitchen dexamethasone (DECADRON) 4 MG tablet Take 1 tablet (4 mg total) by mouth daily. Take 1 tablet day before chemo and 1 tablet day after chemo with food 12 tablet 0  . Etonogestrel-Ethinyl Estradiol (NUVARING VA) Place vaginally. Stays in for three weeks then take it out for one week.    Marland Kitchen HYDROcodone-acetaminophen (NORCO/VICODIN) 5-325 MG tablet Take 1 tablet by mouth every 6 (six) hours as needed for moderate pain. (Patient not taking: Reported on 03/04/2019) 15 tablet 0  . ibuprofen (ADVIL,MOTRIN) 400 MG tablet Take 1 tablet (400 mg total) by mouth every 6 (six) hours as needed for pain. (Patient not taking: Reported on 03/04/2019) 30 tablet 0  . lidocaine-prilocaine (EMLA) cream Apply to affected area once 30 g 3  . LORazepam (ATIVAN) 0.5 MG tablet Take 1 tablet (0.5 mg total) by mouth at bedtime as needed (Nausea or vomiting). 30 tablet 0  . nabumetone (RELAFEN) 500 MG tablet Take 1,000 mg by mouth 2 (two) times daily.    . ondansetron (ZOFRAN) 8 MG tablet Take 1 tablet (8 mg total) by mouth 2 (two) times daily as needed (Nausea or vomiting). Begin 4 days after chemotherapy. 30 tablet 1  . pantoprazole (PROTONIX) 40 MG tablet TAKE 1 TABLET BY MOUTH EVERY DAY 90 tablet 1  . prochlorperazine (COMPAZINE) 10 MG tablet Take 1 tablet (10 mg total) by mouth every 6 (six) hours as needed (Nausea or vomiting). 30 tablet 1  . rosuvastatin  (CRESTOR) 20 MG tablet Take 20 mg by mouth daily.     No current facility-administered medications for this visit.    PHYSICAL EXAMINATION: ECOG PERFORMANCE STATUS: 1 - Symptomatic but completely ambulatory  Vitals:   05/07/19 0828  BP: 135/76  Pulse: (!) 124  Resp: 18  Temp: 98.5 F (36.9 C)  SpO2: 98%   Filed Weights   05/07/19 0828  Weight: 189 lb 8 oz (86 kg)    LABORATORY DATA:  I have reviewed the data as listed CMP Latest Ref Rng & Units 04/18/2019 03/26/2019 03/04/2019  Glucose 70 - 99 mg/dL 114(H) 186(H) 182(H)  BUN 6 - 20 mg/dL 13 13 12   Creatinine 0.44 - 1.00 mg/dL 0.78 0.82 0.89  Sodium 135 - 145 mmol/L 139 138 139  Potassium 3.5 - 5.1 mmol/L 3.6 3.7 3.8  Chloride 98 - 111 mmol/L 109 107 105  CO2 22 - 32 mmol/L 22 20(L) 22  Calcium 8.9 - 10.3 mg/dL 8.9 8.7(L) 9.2  Total Protein 6.5 - 8.1 g/dL 7.0 7.4 7.2  Total Bilirubin 0.3 - 1.2 mg/dL 0.3 0.3 0.3  Alkaline Phos 38 - 126 U/L 78 87 80  AST 15 - 41 U/L 15 13(L) 15  ALT 0 - 44 U/L 17 17 18     Lab Results  Component Value Date  WBC 9.7 05/07/2019   HGB 11.2 (L) 05/07/2019   HCT 33.9 (L) 05/07/2019   MCV 96.0 05/07/2019   PLT 246 05/07/2019   NEUTROABS 8.0 (H) 05/07/2019    ASSESSMENT & PLAN:  Malignant neoplasm of upper-outer quadrant of left breast in female, estrogen receptor positive ([place]) positive ([place]) 01/18/2019:Patient palpated a left breast lump for 2 weeks. Mammogram showed a 2.3cm left breast mass at the 12 o'clock position, with 1 enlarged left axillary lymph node measuring 3.5cm. 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  ER positive, PR negative, HER-2 positive (initial IHC 3+) accidentally FISH was done which was negative. T2N1 stage IIa clinical stage  Treatment plan: 1. Neoadjuvant chemotherapy with TCH Perjeta 6 cycles followed by Herceptin maintenance for 1 year 2. Followed by breast conserving surgery if possible with sentinel lymph node study 3.  Followed by adjuvant radiation therapy if patient had lumpectomy No adverse effects from participation in the neuropathy clinical trial SWOG 1714 --------------------------------------------------------------------------------------------------------------------------------------- Current treatment: Cycle 5 TCH (Perjeta was discontinued with cycle 4 when we recognized that the HER-2 may be negative) Chemo toxicities: 1.  Diarrhea: No longer a problem with her Perjeta 2.  Chemo induced anemia: Today's hemoglobin is 3.  Profound fatigue 4.  Alopecia  We will await to see what the final pathology is regarding the HER-2.  If it is HER-2 negative we may decide not to continue maintenance Herceptin. We discussed about leaving the port in with her final surgery. I ordered a breast MRI to be done after the last chemo. I sent a message to Dr. Georgette Dover to get her in for a follow-up to discuss surgery. We will present her in the tumor board afterwards.  Return to clinic in 3 weeks for cycle 6.    No orders of the defined types were placed in this encounter.  The patient has a good understanding of the overall plan. she agrees with it. she will call with any problems that may develop before the next visit here.  Total time spent: 30 mins including face to face time and time spent for planning, charting and coordination of care  Nicholas Lose, MD 05/07/2019  I, Cloyde Reams Dorshimer, am acting as scribe for Dr. Nicholas Lose.  I have reviewed the above documentation for accuracy and completeness, and I agree with the above.

## 2019-05-07 ENCOUNTER — Encounter: Payer: Self-pay | Admitting: *Deleted

## 2019-05-07 ENCOUNTER — Other Ambulatory Visit: Payer: Self-pay

## 2019-05-07 ENCOUNTER — Inpatient Hospital Stay: Payer: BC Managed Care – PPO

## 2019-05-07 ENCOUNTER — Inpatient Hospital Stay (HOSPITAL_BASED_OUTPATIENT_CLINIC_OR_DEPARTMENT_OTHER): Payer: BC Managed Care – PPO | Admitting: Hematology and Oncology

## 2019-05-07 ENCOUNTER — Inpatient Hospital Stay: Payer: BC Managed Care – PPO | Attending: Hematology and Oncology

## 2019-05-07 VITALS — HR 100

## 2019-05-07 DIAGNOSIS — Z5189 Encounter for other specified aftercare: Secondary | ICD-10-CM | POA: Diagnosis present

## 2019-05-07 DIAGNOSIS — E785 Hyperlipidemia, unspecified: Secondary | ICD-10-CM | POA: Diagnosis not present

## 2019-05-07 DIAGNOSIS — Z791 Long term (current) use of non-steroidal anti-inflammatories (NSAID): Secondary | ICD-10-CM | POA: Insufficient documentation

## 2019-05-07 DIAGNOSIS — Z5111 Encounter for antineoplastic chemotherapy: Secondary | ICD-10-CM | POA: Diagnosis present

## 2019-05-07 DIAGNOSIS — C50412 Malignant neoplasm of upper-outer quadrant of left female breast: Secondary | ICD-10-CM

## 2019-05-07 DIAGNOSIS — R200 Anesthesia of skin: Secondary | ICD-10-CM | POA: Insufficient documentation

## 2019-05-07 DIAGNOSIS — Z7952 Long term (current) use of systemic steroids: Secondary | ICD-10-CM | POA: Diagnosis not present

## 2019-05-07 DIAGNOSIS — Z5112 Encounter for antineoplastic immunotherapy: Secondary | ICD-10-CM | POA: Insufficient documentation

## 2019-05-07 DIAGNOSIS — Z95828 Presence of other vascular implants and grafts: Secondary | ICD-10-CM

## 2019-05-07 DIAGNOSIS — Z170 Estrogen receptor positive status [ER+]: Secondary | ICD-10-CM | POA: Diagnosis not present

## 2019-05-07 DIAGNOSIS — C773 Secondary and unspecified malignant neoplasm of axilla and upper limb lymph nodes: Secondary | ICD-10-CM | POA: Diagnosis not present

## 2019-05-07 DIAGNOSIS — Z793 Long term (current) use of hormonal contraceptives: Secondary | ICD-10-CM | POA: Insufficient documentation

## 2019-05-07 DIAGNOSIS — R5383 Other fatigue: Secondary | ICD-10-CM | POA: Insufficient documentation

## 2019-05-07 DIAGNOSIS — Z79899 Other long term (current) drug therapy: Secondary | ICD-10-CM | POA: Diagnosis not present

## 2019-05-07 LAB — CMP (CANCER CENTER ONLY)
ALT: 22 U/L (ref 0–44)
AST: 16 U/L (ref 15–41)
Albumin: 3.6 g/dL (ref 3.5–5.0)
Alkaline Phosphatase: 92 U/L (ref 38–126)
Anion gap: 10 (ref 5–15)
BUN: 14 mg/dL (ref 6–20)
CO2: 21 mmol/L — ABNORMAL LOW (ref 22–32)
Calcium: 9.2 mg/dL (ref 8.9–10.3)
Chloride: 108 mmol/L (ref 98–111)
Creatinine: 0.81 mg/dL (ref 0.44–1.00)
GFR, Est AFR Am: >60 mL/min (ref >60)
GFR, Estimated: >60 mL/min (ref >60)
Glucose, Bld: 138 mg/dL — ABNORMAL HIGH (ref 70–99)
Potassium: 3.9 mmol/L (ref 3.5–5.1)
Sodium: 139 mmol/L (ref 135–145)
Total Bilirubin: 0.3 mg/dL (ref 0.3–1.2)
Total Protein: 7.6 g/dL (ref 6.5–8.1)

## 2019-05-07 LAB — CBC WITH DIFFERENTIAL (CANCER CENTER ONLY)
Abs Immature Granulocytes: 0.03 10*3/uL (ref 0.00–0.07)
Basophils Absolute: 0 10*3/uL (ref 0.0–0.1)
Basophils Relative: 0 %
Eosinophils Absolute: 0 10*3/uL (ref 0.0–0.5)
Eosinophils Relative: 0 %
HCT: 33.9 % — ABNORMAL LOW (ref 36.0–46.0)
Hemoglobin: 11.2 g/dL — ABNORMAL LOW (ref 12.0–15.0)
Immature Granulocytes: 0 %
Lymphocytes Relative: 13 %
Lymphs Abs: 1.3 10*3/uL (ref 0.7–4.0)
MCH: 31.7 pg (ref 26.0–34.0)
MCHC: 33 g/dL (ref 30.0–36.0)
MCV: 96 fL (ref 80.0–100.0)
Monocytes Absolute: 0.4 10*3/uL (ref 0.1–1.0)
Monocytes Relative: 4 %
Neutro Abs: 8 10*3/uL — ABNORMAL HIGH (ref 1.7–7.7)
Neutrophils Relative %: 83 %
Platelet Count: 246 10*3/uL (ref 150–400)
RBC: 3.53 MIL/uL — ABNORMAL LOW (ref 3.87–5.11)
RDW: 16.4 % — ABNORMAL HIGH (ref 11.5–15.5)
WBC Count: 9.7 10*3/uL (ref 4.0–10.5)
nRBC: 0 % (ref 0.0–0.2)

## 2019-05-07 LAB — PREGNANCY, URINE: Preg Test, Ur: NEGATIVE

## 2019-05-07 MED ORDER — SODIUM CHLORIDE 0.9 % IV SOLN
Freq: Once | INTRAVENOUS | Status: AC
  Filled 2019-05-07: qty 250

## 2019-05-07 MED ORDER — PALONOSETRON HCL INJECTION 0.25 MG/5ML
0.2500 mg | Freq: Once | INTRAVENOUS | Status: AC
  Administered 2019-05-07: 0.25 mg via INTRAVENOUS

## 2019-05-07 MED ORDER — SODIUM CHLORIDE 0.9 % IV SOLN
Freq: Once | INTRAVENOUS | Status: AC
  Filled 2019-05-07: qty 5

## 2019-05-07 MED ORDER — DIPHENHYDRAMINE HCL 25 MG PO CAPS
ORAL_CAPSULE | ORAL | Status: AC
  Filled 2019-05-07: qty 2

## 2019-05-07 MED ORDER — SODIUM CHLORIDE 0.9% FLUSH
10.0000 mL | INTRAVENOUS | Status: DC | PRN
  Administered 2019-05-07: 10 mL
  Filled 2019-05-07: qty 10

## 2019-05-07 MED ORDER — SODIUM CHLORIDE 0.9 % IV SOLN
65.0000 mg/m2 | Freq: Once | INTRAVENOUS | Status: AC
  Administered 2019-05-07: 120 mg via INTRAVENOUS
  Filled 2019-05-07: qty 12

## 2019-05-07 MED ORDER — SODIUM CHLORIDE 0.9 % IV SOLN
700.0000 mg | Freq: Once | INTRAVENOUS | Status: AC
  Administered 2019-05-07: 700 mg via INTRAVENOUS
  Filled 2019-05-07: qty 70

## 2019-05-07 MED ORDER — HEPARIN SOD (PORK) LOCK FLUSH 100 UNIT/ML IV SOLN
500.0000 [IU] | Freq: Once | INTRAVENOUS | Status: AC | PRN
  Administered 2019-05-07: 500 [IU]
  Filled 2019-05-07: qty 5

## 2019-05-07 MED ORDER — ACETAMINOPHEN 325 MG PO TABS
650.0000 mg | ORAL_TABLET | Freq: Once | ORAL | Status: AC
  Administered 2019-05-07: 650 mg via ORAL

## 2019-05-07 MED ORDER — ACETAMINOPHEN 325 MG PO TABS
ORAL_TABLET | ORAL | Status: AC
  Filled 2019-05-07: qty 2

## 2019-05-07 MED ORDER — DIPHENHYDRAMINE HCL 25 MG PO CAPS
50.0000 mg | ORAL_CAPSULE | Freq: Once | ORAL | Status: AC
  Administered 2019-05-07: 50 mg via ORAL

## 2019-05-07 MED ORDER — PALONOSETRON HCL INJECTION 0.25 MG/5ML
INTRAVENOUS | Status: AC
  Filled 2019-05-07: qty 5

## 2019-05-07 MED ORDER — TRASTUZUMAB-DKST CHEMO 150 MG IV SOLR
6.0000 mg/kg | Freq: Once | INTRAVENOUS | Status: AC
  Administered 2019-05-07: 483 mg via INTRAVENOUS
  Filled 2019-05-07: qty 23

## 2019-05-07 MED ORDER — SODIUM CHLORIDE 0.9% FLUSH
10.0000 mL | Freq: Once | INTRAVENOUS | Status: AC
  Administered 2019-05-07: 10 mL
  Filled 2019-05-07: qty 10

## 2019-05-07 NOTE — Progress Notes (Signed)
Per MD okay to treat with HR 121

## 2019-05-07 NOTE — Assessment & Plan Note (Signed)
positive ([place]) 01/18/2019:Patient palpated a left breast lump for 2 weeks. Mammogram showed a 2.3cm left breast mass at the 12 o'clock position, with 1 enlarged left axillary lymph node measuring 3.5cm. 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  ER positive, PR negative, HER-2 positive (initial IHC 3+) accidentally FISH was done which was negative. T2N1 stage IIa clinical stage  Treatment plan: 1. Neoadjuvant chemotherapy with TCH Perjeta 6 cycles followed by Herceptin maintenance for 1 year 2. Followed by breast conserving surgery if possible with sentinel lymph node study 3. Followed by adjuvant radiation therapy if patient had lumpectomy No adverse effects from participation in the neuropathy clinical trial SWOG 1714 --------------------------------------------------------------------------------------------------------------------------------------- Current treatment: Cycle 5 TCH (Perjeta was discontinued with cycle 4 when we recognized that the HER-2 may be negative) Chemo toxicities: 1.  Diarrhea: No longer a problem with her Perjeta 2.  Chemo induced anemia: Today's hemoglobin is 3.  Profound fatigue 4.  Alopecia  We will await to see what the final pathology is regarding the HER-2.  If it is HER-2 negative we may decide not to continue maintenance Herceptin.  Return to clinic in 3 weeks for cycle 6.

## 2019-05-07 NOTE — Patient Instructions (Signed)
[facility] Discharge Instructions for Patients Receiving Chemotherapy  Today you received the following chemotherapy agents Trastuzumab (OGIVRI), Pertuzumab (PERJETA), Docetaxel (TAXOTERE) & Carboplatin (PARAPLATIN).  To help prevent nausea and vomiting after your treatment, we encourage you to take your nausea medication as prescribed.   If you develop nausea and vomiting that is not controlled by your nausea medication, call the clinic.   BELOW ARE SYMPTOMS THAT SHOULD BE REPORTED IMMEDIATELY:  *FEVER GREATER THAN 100.5 F  *CHILLS WITH OR WITHOUT FEVER  NAUSEA AND VOMITING THAT IS NOT CONTROLLED WITH YOUR NAUSEA MEDICATION  *UNUSUAL SHORTNESS OF BREATH  *UNUSUAL BRUISING OR BLEEDING  TENDERNESS IN MOUTH AND THROAT WITH OR WITHOUT PRESENCE OF ULCERS  *URINARY PROBLEMS  *BOWEL PROBLEMS  UNUSUAL RASH Items with * indicate a potential emergency and should be followed up as soon as possible.  Feel free to call the clinic should you have any questions or concerns. The clinic phone number is [phone].  Please show the [place] [facility] at check-in to the Emergency Department and triage nurse.  Coronavirus (COVID-19) Are you at risk?  Are you at risk for the Coronavirus (COVID-19)?  To be considered HIGH RISK for Coronavirus (COVID-19), you have to meet the following criteria:  . Traveled to [place], [place], [place], [place] or [place]; or in the [place] to [place], [place], [place], or [place]; and have fever, cough, and shortness of breath within the last 2 weeks of travel OR . Been in close contact with a person diagnosed with COVID-19 within the last 2 weeks and have fever, cough, and shortness of breath . IF YOU DO NOT MEET THESE CRITERIA, YOU ARE CONSIDERED LOW RISK FOR COVID-19.  What to do if you are HIGH RISK for COVID-19?  Marland Kitchen If you are having a medical emergency, call 911. . Seek medical care right away. Before you go to a doctor's  office, urgent care or emergency department, call ahead and tell them about your recent travel, contact with someone diagnosed with COVID-19, and your symptoms. You should receive instructions from your physician's office regarding next steps of care.  . When you arrive at healthcare provider, tell the healthcare staff immediately you have returned from visiting [place], [place], [place], [place] or [place]; or traveled in the [place] to [place], [place], [place], or [place]; in the last two weeks or you have been in close contact with a person diagnosed with COVID-19 in the last 2 weeks.   . Tell the health care staff about your symptoms: fever, cough and shortness of breath. . After you have been seen by a medical provider, you will be either: o Tested for (COVID-19) and discharged home on quarantine except to seek medical care if symptoms worsen, and asked to  - Stay home and avoid contact with others until you get your results (4-5 days)  - Avoid travel on public transportation if possible (such as bus, train, or airplane) or o Sent to the Emergency Department by EMS for evaluation, COVID-19 testing, and possible admission depending on your condition and test results.  What to do if you are LOW RISK for COVID-19?  Reduce your risk of any infection by using the same precautions used for avoiding the common cold or flu:  Marland Kitchen Wash your hands often with soap and warm water for at least 20 seconds.  If soap and water are not readily available, use an alcohol-based hand sanitizer with at least  60% alcohol.  . If coughing or sneezing, cover your mouth and nose by coughing or sneezing into the elbow areas of your shirt or coat, into a tissue or into your sleeve (not your hands). . Avoid shaking hands with others and consider head nods or verbal greetings only. . Avoid touching your eyes, nose, or mouth with unwashed hands.  . Avoid close contact with people who are sick. . Avoid places or  events with large numbers of people in one location, like concerts or sporting events. . Carefully consider travel plans you have or are making. . If you are planning any travel outside or inside the [place], visit the CDC's Travelers' Health webpage for the latest health notices. . If you have some symptoms but not all symptoms, continue to monitor at home and seek medical attention if your symptoms worsen. . If you are having a medical emergency, call 911.   [place] / e-Visit: [email or web address]         [facility]: [facility]: [phone]                   [facility]: [phone]

## 2019-05-07 NOTE — Patient Instructions (Signed)

## 2019-05-08 ENCOUNTER — Encounter: Payer: Self-pay | Admitting: *Deleted

## 2019-05-08 ENCOUNTER — Telehealth: Payer: Self-pay | Admitting: Hematology and Oncology

## 2019-05-08 NOTE — Telephone Encounter (Signed)
I talk with patient regarding schedule  

## 2019-05-09 ENCOUNTER — Other Ambulatory Visit: Payer: Self-pay

## 2019-05-09 ENCOUNTER — Inpatient Hospital Stay: Payer: BC Managed Care – PPO

## 2019-05-09 VITALS — BP 126/69 | HR 116 | Temp 97.8°F | Resp 18

## 2019-05-09 DIAGNOSIS — Z5189 Encounter for other specified aftercare: Secondary | ICD-10-CM | POA: Diagnosis not present

## 2019-05-09 DIAGNOSIS — C50412 Malignant neoplasm of upper-outer quadrant of left female breast: Secondary | ICD-10-CM

## 2019-05-09 DIAGNOSIS — Z170 Estrogen receptor positive status [ER+]: Secondary | ICD-10-CM

## 2019-05-09 MED ORDER — PEGFILGRASTIM-JMDB 6 MG/0.6ML ~~LOC~~ SOSY
PREFILLED_SYRINGE | SUBCUTANEOUS | Status: AC
  Filled 2019-05-09: qty 0.6

## 2019-05-09 MED ORDER — PEGFILGRASTIM-JMDB 6 MG/0.6ML ~~LOC~~ SOSY
6.0000 mg | PREFILLED_SYRINGE | Freq: Once | SUBCUTANEOUS | Status: AC
  Administered 2019-05-09: 6 mg via SUBCUTANEOUS

## 2019-05-09 NOTE — Patient Instructions (Signed)

## 2019-05-27 NOTE — Progress Notes (Signed)
Patient Care Team: [name], NP as PCP - General Mauro Kaufmann, RN as Oncology Nurse Navigator Rockwell Germany, RN as Oncology Nurse Navigator Donnie Mesa, MD as Consulting Physician (General Surgery) Nicholas Lose, MD as Consulting Physician (Hematology and Oncology) Kyung Rudd, MD as Consulting Physician (Radiation Oncology)  DIAGNOSIS:    ICD-10-CM   1. Malignant neoplasm of upper-outer quadrant of left breast in female, estrogen receptor positive ([place])  C50.412    Z17.0     SUMMARY OF ONCOLOGIC HISTORY: Oncology History  Malignant neoplasm of upper-outer quadrant of left breast in female, estrogen receptor positive ([place])  01/18/2019 Initial Diagnosis   Patient palpated a left breast lump for 2 weeks. Mammogram showed a 2.3cm left breast mass at the 12 o'clock position, with 1 enlarged left axillary lymph node measuring 3.5cm. 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   01/23/2019 Cancer Staging   Staging form: Breast, AJCC 8th Edition - Clinical stage from 01/23/2019: Stage IIA (cT2, cN1(f), cM0, G2, ER+, PR-, HER2+) - Signed by Nicholas Lose, MD on 01/23/2019   02/13/2019 -  Chemotherapy   The patient had palonosetron (ALOXI) injection 0.25 mg, 0.25 mg, Intravenous,  Once, 5 of 6 cycles Administration: 0.25 mg (02/13/2019), 0.25 mg (03/04/2019), 0.25 mg (05/07/2019), 0.25 mg (03/26/2019), 0.25 mg (04/18/2019) pegfilgrastim-jmdb (FULPHILA) injection 6 mg, 6 mg, Subcutaneous,  Once, 5 of 6 cycles Administration: 6 mg (02/15/2019), 6 mg (03/06/2019), 6 mg (05/09/2019), 6 mg (03/28/2019), 6 mg (04/20/2019) CARBOplatin (PARAPLATIN) 700 mg in sodium chloride 0.9 % 250 mL chemo infusion, 700 mg (100 % of original dose 700 mg), Intravenous,  Once, 5 of 6 cycles Dose modification: 700 mg (original dose 700 mg, Cycle 1) Administration: 700 mg (02/13/2019), 700 mg (03/04/2019), 700 mg (05/07/2019), 700 mg (03/26/2019), 700 mg (04/18/2019) DOCEtaxel  (TAXOTERE) 140 mg in sodium chloride 0.9 % 250 mL chemo infusion, 75 mg/m2 = 140 mg, Intravenous,  Once, 5 of 6 cycles Dose modification: 65 mg/m2 (original dose 75 mg/m2, Cycle 5, Reason: Dose not tolerated) Administration: 140 mg (02/13/2019), 140 mg (03/04/2019), 120 mg (05/07/2019), 140 mg (03/26/2019), 140 mg (04/18/2019) pertuzumab (PERJETA) 420 mg in sodium chloride 0.9 % 250 mL chemo infusion, 420 mg (100 % of original dose 420 mg), Intravenous, Once, 3 of 3 cycles Dose modification: 420 mg (original dose 420 mg, Cycle 1, Reason: Provider Judgment) Administration: 420 mg (02/13/2019), 420 mg (03/04/2019), 420 mg (03/26/2019) fosaprepitant (EMEND) 150 mg, dexamethasone (DECADRON) 12 mg in sodium chloride 0.9 % 145 mL IVPB, , Intravenous,  Once, 5 of 6 cycles Administration:  (02/13/2019),  (03/04/2019),  (05/07/2019),  (03/26/2019),  (04/18/2019) trastuzumab-dkst (OGIVRI) 651 mg in sodium chloride 0.9 % 250 mL chemo infusion, 8 mg/kg = 651 mg, Intravenous,  Once, 5 of 6 cycles Administration: 651 mg (02/13/2019), 483 mg (03/04/2019), 483 mg (05/07/2019), 483 mg (03/26/2019), 483 mg (04/18/2019)  for chemotherapy treatment.     Genetic Testing   Negative genetic testing. No pathogenic variants identified on the [facility] Common Hereditary Cancers Panel. The Common Hereditary Cancers Panel offered by [facility] includes sequencing and/or deletion duplication testing of the following 48 genes: APC, ATM, AXIN2, BARD1, BMPR1A, BRCA1, BRCA2, BRIP1, CDH1, CDKN2A (p14ARF), CDKN2A (p16INK4a), CKD4, CHEK2, CTNNA1, DICER1, EPCAM (Deletion/duplication testing only), GREM1 (promoter region deletion/duplication testing only), KIT, MEN1, MLH1, MSH2, MSH3, MSH6, MUTYH, NBN, NF1, NHTL1, PALB2, PDGFRA, PMS2, POLD1, POLE, PTEN, RAD50, RAD51C, RAD51D, RNF43, SDHB, SDHC, SDHD, SMAD4, SMARCA4. STK11, TP53, TSC1, TSC2, and VHL.  The following genes were evaluated for sequence changes only: SDHA and HOXB13 c.251G>A variant only. The  report date is 02/01/2019.      CHIEF COMPLIANT: Cycle6TCH   INTERVAL HISTORY: KINLIE JANICE is a 44 y.o. with above-mentioned history of left breast cancer. She is currently undergoing neoadjuvant chemotherapy with TCH, as Perjeta was discontinued.She presents to the clinic todayforcycle 6.   ALLERGIES:  is allergic to other.  MEDICATIONS:  Current Outpatient Medications  Medication Sig Dispense Refill  . cetirizine (ZYRTEC) 10 MG tablet Take 10 mg by mouth as needed.     Marland Kitchen ibuprofen (ADVIL,MOTRIN) 400 MG tablet Take 1 tablet (400 mg total) by mouth every 6 (six) hours as needed for pain. (Patient not taking: Reported on 03/04/2019) 30 tablet 0  . lidocaine-prilocaine (EMLA) cream Apply to affected area once 30 g 3  . LORazepam (ATIVAN) 0.5 MG tablet Take 1 tablet (0.5 mg total) by mouth at bedtime as needed (Nausea or vomiting). 30 tablet 0  . nabumetone (RELAFEN) 500 MG tablet Take 1,000 mg by mouth 2 (two) times daily.    . pantoprazole (PROTONIX) 40 MG tablet TAKE 1 TABLET BY MOUTH EVERY DAY 90 tablet 1  . rosuvastatin (CRESTOR) 20 MG tablet Take 20 mg by mouth daily.     No current facility-administered medications for this visit.    PHYSICAL EXAMINATION: ECOG PERFORMANCE STATUS: 1 - Symptomatic but completely ambulatory  Vitals:   05/28/19 0858  BP: 126/83  Pulse: 89  Resp: 17  Temp: 98.2 F (36.8 C)  SpO2: 100%   Filed Weights   05/28/19 0858  Weight: 191 lb (86.6 kg)    LABORATORY DATA:  I have reviewed the data as listed CMP Latest Ref Rng & Units 05/07/2019 04/18/2019 03/26/2019  Glucose 70 - 99 mg/dL 138(H) 114(H) 186(H)  BUN 6 - 20 mg/dL _0 Creatinine 0.44 - 1.00 mg/dL 0.81 0.78 0.82  Sodium 135 - 145 mmol/L 139 139 138  Potassium 3.5 - 5.1 mmol/L 3.9 3.6 3.7  Chloride 98 - 111 mmol/L 108 109 107  CO2 22 - 32 mmol/L 21(L) 22 20(L)  Calcium 8.9 - 10.3 mg/dL 9.2 8.9 8.7(L)  Total Protein 6.5 - 8.1 g/dL 7.6 7.0 7.4  Total Bilirubin 0.3 - 1.2  mg/dL 0.3 0.3 0.3  Alkaline Phos 38 - 126 U/L 92 78 87  AST 15 - 41 U/L 16 15 13(L)  ALT 0 - 44 U/L _1 Lab Results  Component Value Date   WBC 8.1 05/28/2019   HGB 9.9 (L) 05/28/2019   HCT 29.7 (L) 05/28/2019   MCV 100.0 05/28/2019   PLT 186 05/28/2019   NEUTROABS 4.9 05/28/2019    ASSESSMENT & PLAN:  Malignant neoplasm of upper-outer quadrant of left breast in female, estrogen receptor positive ([place]) 01/18/2019:Patient palpated a left breast lump for 2 weeks. Mammogram showed a 2.3cm left breast mass at the 12 o'clock position, with 1 enlarged left axillary lymph node measuring 3.5cm. 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  ER positive, PR negative, HER-2 positive (initial IHC 3+) accidentally FISH was done which was negative. T2N1 stage IIa clinical stage  Treatment plan: 1. Neoadjuvant chemotherapy with TCH Perjeta 6 cycles followed by Herceptin maintenance for 1 year 2. Followed by breast conserving surgery if possible with sentinel lymph node study 3. Followed by adjuvant radiation therapy if patient had lumpectomy No adverse effects from participation in the neuropathy clinical  trial SWOG 1714 --------------------------------------------------------------------------------------------------------------------------------------- Current treatment: Cycle 6 TCH (Perjeta was discontinued with cycle 4 when we recognized that the HER-2 may be negative) Chemo toxicities: 1.  Diarrhea: No longer a problem with her Perjeta 2.  Chemo induced anemia: Today's hemoglobin is 3.  Profound fatigue 4.  Alopecia  We will await to see what the final pathology is regarding the HER-2.  If it is HER-2 negative we may decide not to continue maintenance Herceptin. I would like her to keep the port in place. Breast MRI has been ordered Tumor board discussion Return to clinic 1 week after surgery to discuss the final pathology report and to decide on  adjuvant treatment.    No orders of the defined types were placed in this encounter.  The patient has a good understanding of the overall plan. she agrees with it. she will call with any problems that may develop before the next visit here.  Total time spent: 30 mins including face to face time and time spent for planning, charting and coordination of care  Gudena, Vinay, MD 05/28/2019  I, Molly Dorshimer, am acting as scribe for Dr. Vinay Gudena.  I have reviewed the above documentation for accuracy and completeness, and I agree with the above.       

## 2019-05-28 ENCOUNTER — Inpatient Hospital Stay (HOSPITAL_BASED_OUTPATIENT_CLINIC_OR_DEPARTMENT_OTHER): Payer: BC Managed Care – PPO | Admitting: Hematology and Oncology

## 2019-05-28 ENCOUNTER — Inpatient Hospital Stay: Payer: BC Managed Care – PPO

## 2019-05-28 ENCOUNTER — Encounter: Payer: Self-pay | Admitting: *Deleted

## 2019-05-28 ENCOUNTER — Other Ambulatory Visit: Payer: Self-pay | Admitting: Hematology and Oncology

## 2019-05-28 ENCOUNTER — Other Ambulatory Visit: Payer: Self-pay

## 2019-05-28 ENCOUNTER — Inpatient Hospital Stay: Payer: BC Managed Care – PPO | Attending: Hematology and Oncology

## 2019-05-28 DIAGNOSIS — Z5189 Encounter for other specified aftercare: Secondary | ICD-10-CM | POA: Insufficient documentation

## 2019-05-28 DIAGNOSIS — Z791 Long term (current) use of non-steroidal anti-inflammatories (NSAID): Secondary | ICD-10-CM | POA: Diagnosis not present

## 2019-05-28 DIAGNOSIS — C773 Secondary and unspecified malignant neoplasm of axilla and upper limb lymph nodes: Secondary | ICD-10-CM | POA: Diagnosis not present

## 2019-05-28 DIAGNOSIS — E785 Hyperlipidemia, unspecified: Secondary | ICD-10-CM | POA: Diagnosis not present

## 2019-05-28 DIAGNOSIS — Z5112 Encounter for antineoplastic immunotherapy: Secondary | ICD-10-CM | POA: Diagnosis present

## 2019-05-28 DIAGNOSIS — C50412 Malignant neoplasm of upper-outer quadrant of left female breast: Secondary | ICD-10-CM

## 2019-05-28 DIAGNOSIS — Z79899 Other long term (current) drug therapy: Secondary | ICD-10-CM | POA: Diagnosis not present

## 2019-05-28 DIAGNOSIS — Z170 Estrogen receptor positive status [ER+]: Secondary | ICD-10-CM | POA: Diagnosis not present

## 2019-05-28 DIAGNOSIS — Z5111 Encounter for antineoplastic chemotherapy: Secondary | ICD-10-CM | POA: Insufficient documentation

## 2019-05-28 LAB — CMP (CANCER CENTER ONLY)
ALT: 34 U/L (ref 0–44)
AST: 22 U/L (ref 15–41)
Albumin: 3.4 g/dL — ABNORMAL LOW (ref 3.5–5.0)
Alkaline Phosphatase: 86 U/L (ref 38–126)
Anion gap: 9 (ref 5–15)
BUN: 13 mg/dL (ref 6–20)
CO2: 25 mmol/L (ref 22–32)
Calcium: 9.1 mg/dL (ref 8.9–10.3)
Chloride: 108 mmol/L (ref 98–111)
Creatinine: 0.81 mg/dL (ref 0.44–1.00)
GFR, Est AFR Am: >60 mL/min (ref >60)
GFR, Estimated: >60 mL/min (ref >60)
Glucose, Bld: 107 mg/dL — ABNORMAL HIGH (ref 70–99)
Potassium: 3.5 mmol/L (ref 3.5–5.1)
Sodium: 142 mmol/L (ref 135–145)
Total Bilirubin: 0.4 mg/dL (ref 0.3–1.2)
Total Protein: 6.7 g/dL (ref 6.5–8.1)

## 2019-05-28 LAB — CBC WITH DIFFERENTIAL (CANCER CENTER ONLY)
Abs Immature Granulocytes: 0.02 10*3/uL (ref 0.00–0.07)
Basophils Absolute: 0 10*3/uL (ref 0.0–0.1)
Basophils Relative: 0 %
Eosinophils Absolute: 0 10*3/uL (ref 0.0–0.5)
Eosinophils Relative: 0 %
HCT: 29.7 % — ABNORMAL LOW (ref 36.0–46.0)
Hemoglobin: 9.9 g/dL — ABNORMAL LOW (ref 12.0–15.0)
Immature Granulocytes: 0 %
Lymphocytes Relative: 24 %
Lymphs Abs: 2 10*3/uL (ref 0.7–4.0)
MCH: 33.3 pg (ref 26.0–34.0)
MCHC: 33.3 g/dL (ref 30.0–36.0)
MCV: 100 fL (ref 80.0–100.0)
Monocytes Absolute: 1.2 10*3/uL — ABNORMAL HIGH (ref 0.1–1.0)
Monocytes Relative: 15 %
Neutro Abs: 4.9 10*3/uL (ref 1.7–7.7)
Neutrophils Relative %: 61 %
Platelet Count: 186 10*3/uL (ref 150–400)
RBC: 2.97 MIL/uL — ABNORMAL LOW (ref 3.87–5.11)
RDW: 15.8 % — ABNORMAL HIGH (ref 11.5–15.5)
WBC Count: 8.1 10*3/uL (ref 4.0–10.5)
nRBC: 0 % (ref 0.0–0.2)

## 2019-05-28 LAB — PREGNANCY, URINE: Preg Test, Ur: NEGATIVE

## 2019-05-28 MED ORDER — ACETAMINOPHEN 325 MG PO TABS
ORAL_TABLET | ORAL | Status: AC
  Filled 2019-05-28: qty 2

## 2019-05-28 MED ORDER — HEPARIN SOD (PORK) LOCK FLUSH 100 UNIT/ML IV SOLN
500.0000 [IU] | Freq: Once | INTRAVENOUS | Status: AC | PRN
  Administered 2019-05-28: 500 [IU]
  Filled 2019-05-28: qty 5

## 2019-05-28 MED ORDER — SODIUM CHLORIDE 0.9 % IV SOLN
700.0000 mg | Freq: Once | INTRAVENOUS | Status: AC
  Administered 2019-05-28: 700 mg via INTRAVENOUS
  Filled 2019-05-28: qty 70

## 2019-05-28 MED ORDER — PALONOSETRON HCL INJECTION 0.25 MG/5ML
INTRAVENOUS | Status: AC
  Filled 2019-05-28: qty 5

## 2019-05-28 MED ORDER — DIPHENHYDRAMINE HCL 25 MG PO CAPS
50.0000 mg | ORAL_CAPSULE | Freq: Once | ORAL | Status: AC
  Administered 2019-05-28: 50 mg via ORAL

## 2019-05-28 MED ORDER — TRASTUZUMAB-DKST CHEMO 150 MG IV SOLR
6.0000 mg/kg | Freq: Once | INTRAVENOUS | Status: AC
  Administered 2019-05-28: 483 mg via INTRAVENOUS
  Filled 2019-05-28: qty 23

## 2019-05-28 MED ORDER — SODIUM CHLORIDE 0.9 % IV SOLN
Freq: Once | INTRAVENOUS | Status: AC
  Filled 2019-05-28: qty 250

## 2019-05-28 MED ORDER — DIPHENHYDRAMINE HCL 25 MG PO CAPS
ORAL_CAPSULE | ORAL | Status: AC
  Filled 2019-05-28: qty 2

## 2019-05-28 MED ORDER — SODIUM CHLORIDE 0.9 % IV SOLN
65.0000 mg/m2 | Freq: Once | INTRAVENOUS | Status: AC
  Administered 2019-05-28: 120 mg via INTRAVENOUS
  Filled 2019-05-28: qty 12

## 2019-05-28 MED ORDER — PALONOSETRON HCL INJECTION 0.25 MG/5ML
0.2500 mg | Freq: Once | INTRAVENOUS | Status: AC
  Administered 2019-05-28: 0.25 mg via INTRAVENOUS

## 2019-05-28 MED ORDER — ACETAMINOPHEN 325 MG PO TABS
650.0000 mg | ORAL_TABLET | Freq: Once | ORAL | Status: AC
  Administered 2019-05-28: 650 mg via ORAL

## 2019-05-28 MED ORDER — SODIUM CHLORIDE 0.9 % IV SOLN
Freq: Once | INTRAVENOUS | Status: AC
  Filled 2019-05-28: qty 5

## 2019-05-28 MED ORDER — SODIUM CHLORIDE 0.9% FLUSH
10.0000 mL | INTRAVENOUS | Status: DC | PRN
  Administered 2019-05-28: 10 mL
  Filled 2019-05-28: qty 10

## 2019-05-28 NOTE — Assessment & Plan Note (Signed)
01/18/2019:Patient palpated a left breast lump for 2 weeks. Mammogram showed a 2.3cm left breast mass at the 12 o'clock position, with 1 enlarged left axillary lymph node measuring 3.5cm. 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  ER positive, PR negative, HER-2 positive (initial IHC 3+) accidentally FISH was done which was negative. T2N1 stage IIa clinical stage  Treatment plan: 1. Neoadjuvant chemotherapy with TCH Perjeta 6 cycles followed by Herceptin maintenance for 1 year 2. Followed by breast conserving surgery if possible with sentinel lymph node study 3. Followed by adjuvant radiation therapy if patient had lumpectomy No adverse effects from participation in the neuropathy clinical trial SWOG [id number] --------------------------------------------------------------------------------------------------------------------------------------- Current treatment: Cycle 6 TCH (Perjeta was discontinued with cycle 4 when we recognized that the HER-2 may be negative) Chemo toxicities: 1.  Diarrhea: No longer a problem with her Perjeta 2.  Chemo induced anemia: Today's hemoglobin is 3.  Profound fatigue 4.  Alopecia  We will await to see what the final pathology is regarding the HER-2.  If it is HER-2 negative we may decide not to continue maintenance Herceptin.  Breast MRI has been ordered Tumor board discussion MyChart virtual visit after the breast MRI to discuss results

## 2019-05-28 NOTE — Patient Instructions (Signed)
[name] Discharge Instructions for Patients Receiving Chemotherapy  Today you received the following chemotherapy agents: Trastuzumab, Docetaxel, and Carboplatin  To help prevent nausea and vomiting after your treatment, we encourage you to take your nausea medication  as prescribed.    If you develop nausea and vomiting that is not controlled by your nausea medication, call the clinic.   BELOW ARE SYMPTOMS THAT SHOULD BE REPORTED IMMEDIATELY:  *FEVER GREATER THAN 100.5 F  *CHILLS WITH OR WITHOUT FEVER  NAUSEA AND VOMITING THAT IS NOT CONTROLLED WITH YOUR NAUSEA MEDICATION  *UNUSUAL SHORTNESS OF BREATH  *UNUSUAL BRUISING OR BLEEDING  TENDERNESS IN MOUTH AND THROAT WITH OR WITHOUT PRESENCE OF ULCERS  *URINARY PROBLEMS  *BOWEL PROBLEMS  UNUSUAL RASH Items with * indicate a potential emergency and should be followed up as soon as possible.  Feel free to call the clinic should you have any questions or concerns. The clinic phone number is [phone].  Please show the CHEMO ALERT CARD at check-in to the Emergency Department and triage nurse.   

## 2019-05-28 NOTE — Patient Instructions (Signed)

## 2019-05-29 ENCOUNTER — Ambulatory Visit
Admission: RE | Admit: 2019-05-29 | Discharge: 2019-05-29 | Disposition: A | Discharge status: Home / Self Care | Payer: BC Managed Care – PPO | Source: Ambulatory Visit | Attending: Hematology and Oncology | Admitting: Hematology and Oncology

## 2019-05-29 ENCOUNTER — Encounter: Payer: Self-pay | Admitting: *Deleted

## 2019-05-29 DIAGNOSIS — C50412 Malignant neoplasm of upper-outer quadrant of left female breast: Secondary | ICD-10-CM

## 2019-05-29 IMAGING — MR MR BREAST BILAT WO/W CM
8 of 12 series · 33 of 48 positions shown · IV contrast (9ml Gadavist)
Comparison: Previous exam(s).

CLINICAL DATA: 43-year-old female with known left breast cancer
metastatic left axillary lymphadenopathy presents for re-evaluation
after neoadjuvant chemotherapy.

LABS:  None performed on site.
EXAM:
BILATERAL BREAST MRI WITH AND WITHOUT CONTRAST
TECHNIQUE: Multiplanar, multisequence MR images of both breasts were obtained
prior to and following the intravenous administration of 9 ml of
Gadavist.

[Series 2: t2_tirm_tra ipat (a-p) · axial · 3.0mm · 0.70mm/px · 1 of 60 slices shown]
[im 1/60]
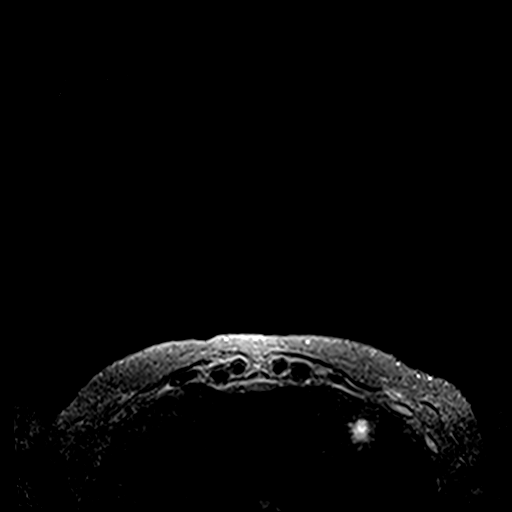

[Series 3: fl3d pre-cm no · axial · non-contrast · 1.2mm · 0.94mm/px · z∈[-96,+95]mm · 5 of 160 slices shown]
[im 1/160]
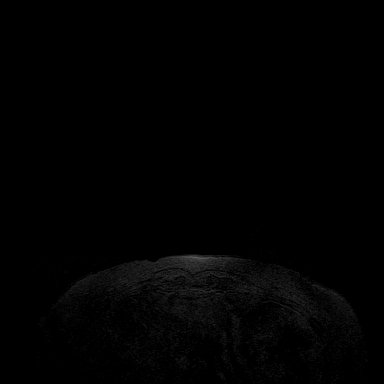
[im 40/160]
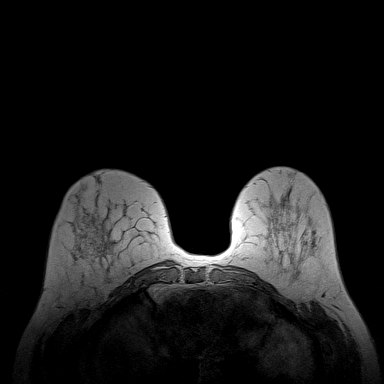
[im 80/160]
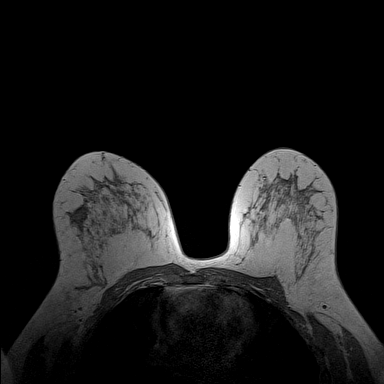
[im 120/160]
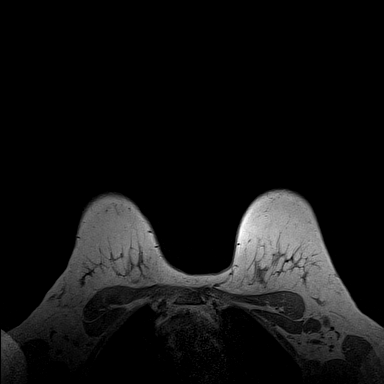
[im 160/160]
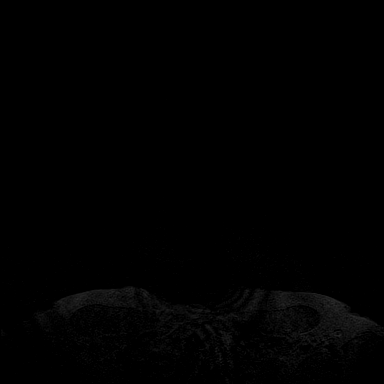

[Series 4: fl3d pre-cm · axial · non-contrast · 1.2mm · 0.94mm/px · z∈[-86,+85]mm · 5 of 144 slices shown]
[im 1/144]
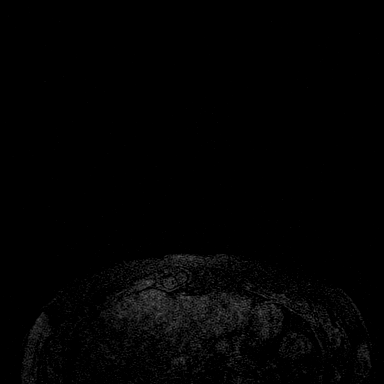
[im 36/144]
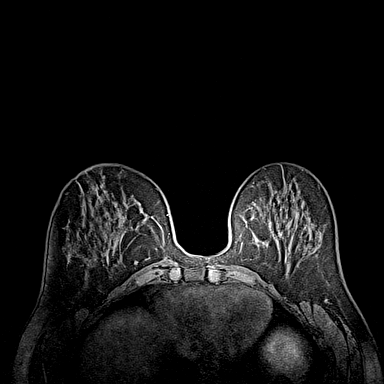
[im 72/144]
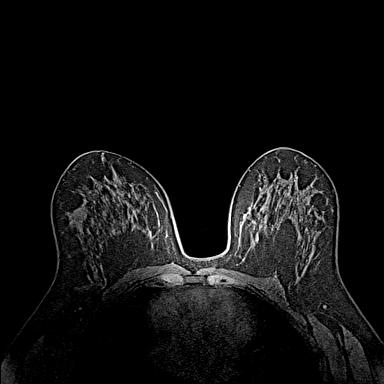
[im 108/144]
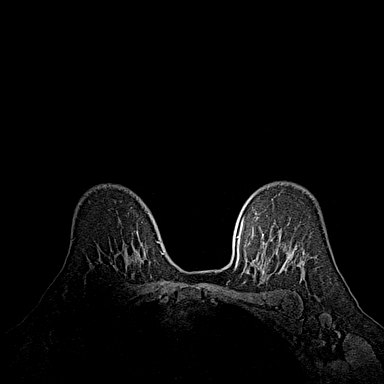
[im 144/144]
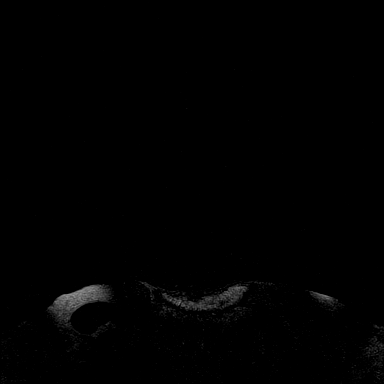

[Series 5: fl3d post-cm 20 · axial · 1.2mm · 0.94mm/px · z∈[-86,+85]mm · 5 of 144 slices shown (1 of 3)]
[im 1/144]
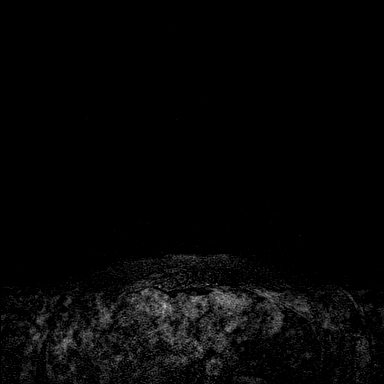
[im 36/144]
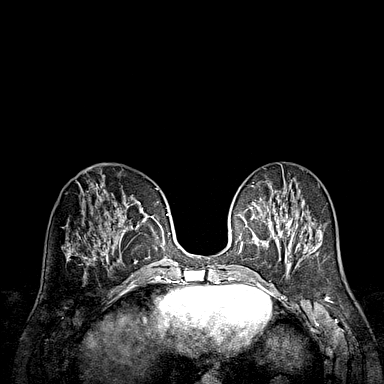
[im 72/144]
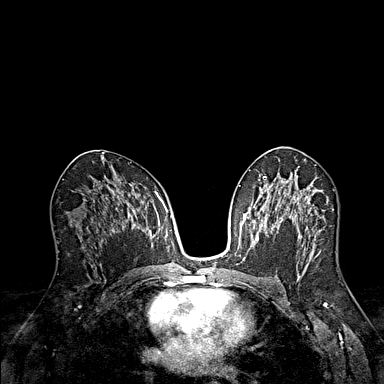
[im 108/144]
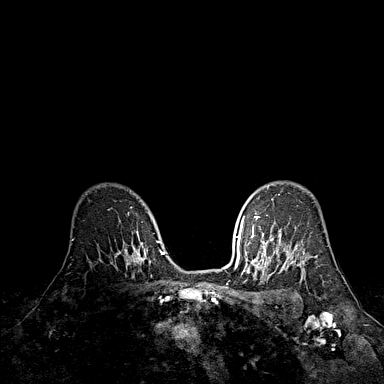
[im 144/144]
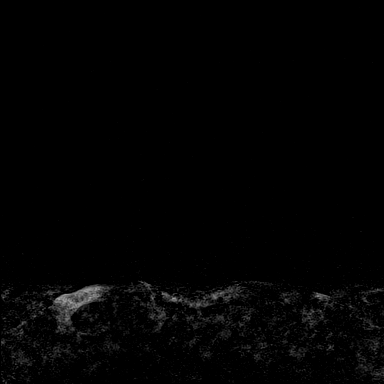

[Series 6: fl3d post-cm 20 · axial · 1.2mm · 0.94mm/px · z∈[-86,+85]mm · 5 of 144 slices shown (2 of 3)]
[im 1/144]
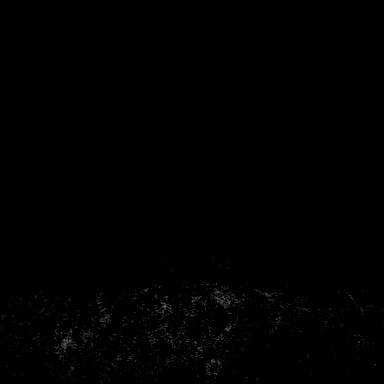
[im 36/144]
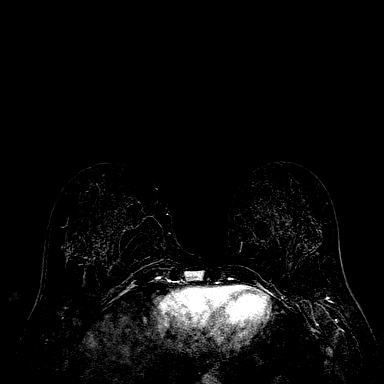
[im 72/144]
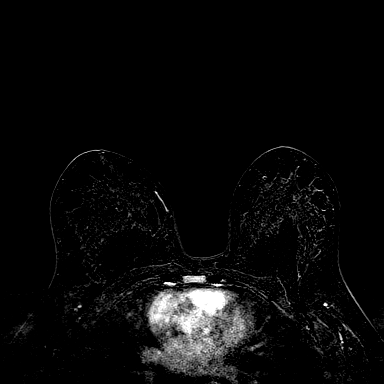
[im 108/144]
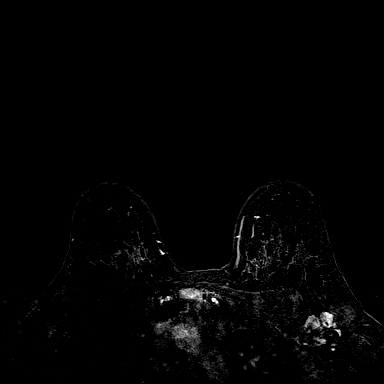
[im 144/144]
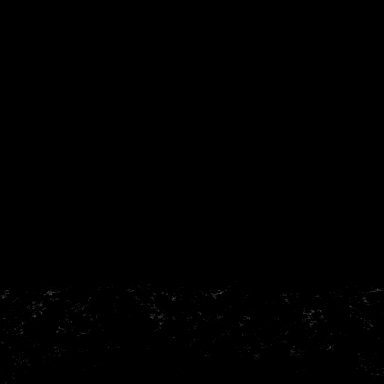

[Series 7: fl3d post-cm 20 · axial · 172.8mm · 0.94mm/px · 1 of 1 slices shown (3 of 3)]
[im 1/1]
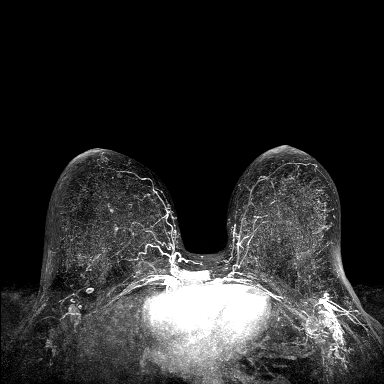

[Series 8: fl3d post-cm 3min · axial · 1.2mm · 0.94mm/px · z∈[-86,+85]mm · 6 of 144 slices shown]
[im 1/144]
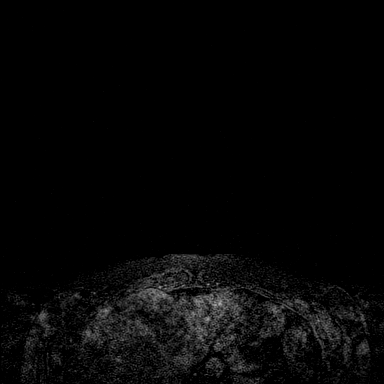
[im 29/144]
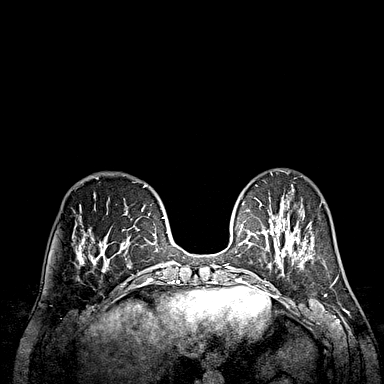
[im 58/144]
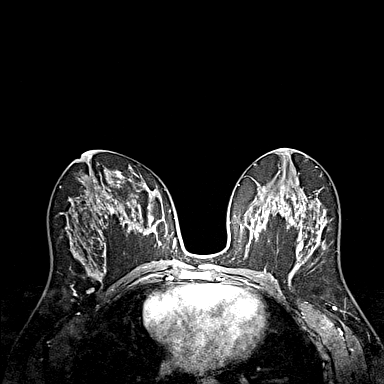
[im 86/144]
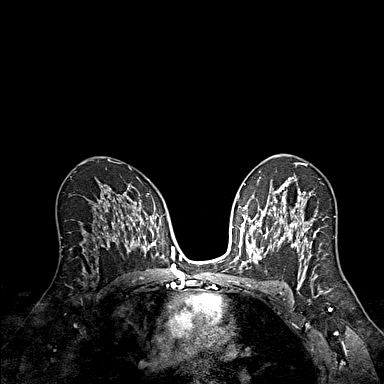
[im 115/144]
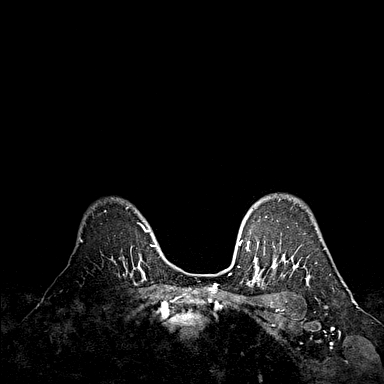
[im 144/144]
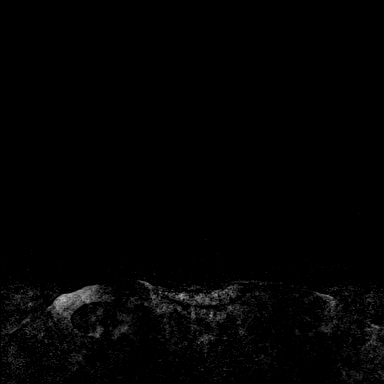

[Series 9: fl3d post-cm 3min_sub · axial · 1.2mm · 0.94mm/px · z∈[-86,+51]mm · 5 of 144 slices shown]
[im 1/144]
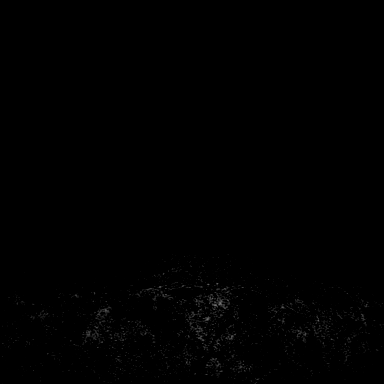
[im 29/144]
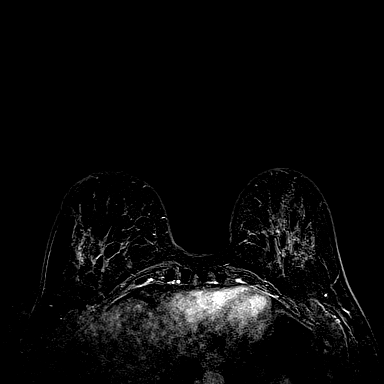
[im 58/144]
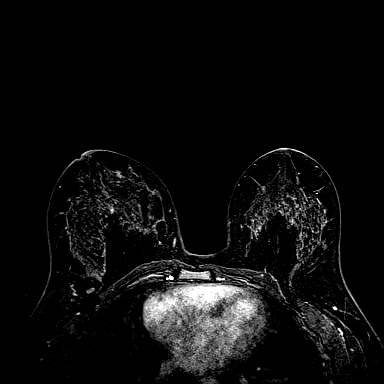
[im 86/144]
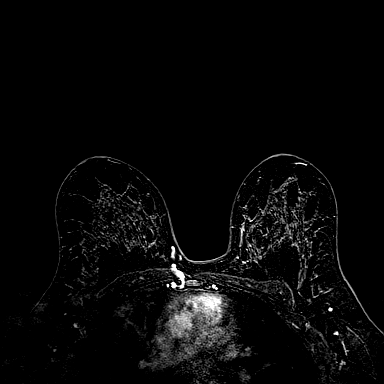
[im 115/144]
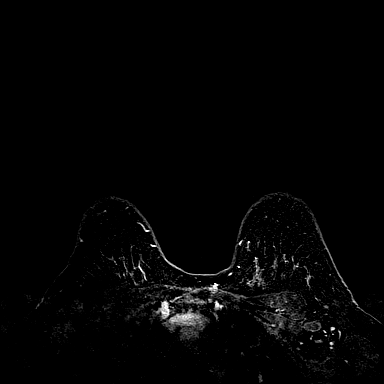

[33 of 48 positions shown; findings below may reference images not displayed]

Three-dimensional MR images were rendered by post-processing of the
original MR data on an independent workstation. The
three-dimensional MR images were interpreted, and findings are
reported in the following complete MRI report for this study. Three
dimensional images were evaluated at the independent DynaCad
workstation
FINDINGS: Breast composition: c. Heterogeneous fibroglandular tissue.

Background parenchymal enhancement: Moderate.

Right breast: No mass or abnormal enhancement. Note is made of port
placement along the medial right chest wall.

Left breast: Susceptibility artifact from post biopsy clip is noted
in the upper-outer left breast posteriorly (series 5, image 37/144).
There has been complete resolution of the associated enhancing mass.
No additional suspicious findings are identified in the remainder of
the left breast.

Lymph nodes: Interval improvement in 2 previously markedly enlarged
left axillary lymph nodes. Again no suspicious right axillary or
internal mammary chain lymphadenopathy.

Ancillary findings:  None.
IMPRESSION: 1. Complete imaging resolution of the known malignancy in the
upper-outer left breast suggesting excellent response to
chemotherapy.
2. Marked interval improvement of two prominent left axillary lymph
nodes.
3. No suspicious findings on the right.

RECOMMENDATION:
Per clinical treatment plan.

BI-RADS CATEGORY  6: Known biopsy-proven malignancy.

## 2019-05-29 MED ORDER — GADOBUTROL 1 MMOL/ML IV SOLN
9.0000 mL | Freq: Once | INTRAVENOUS | Status: AC | PRN
  Administered 2019-05-29: 09:00:00 9 mL via INTRAVENOUS

## 2019-05-30 ENCOUNTER — Inpatient Hospital Stay: Payer: BC Managed Care – PPO

## 2019-05-30 ENCOUNTER — Other Ambulatory Visit: Payer: Self-pay

## 2019-05-30 VITALS — BP 109/69 | HR 89 | Temp 98.5°F | Resp 18

## 2019-05-30 DIAGNOSIS — C50412 Malignant neoplasm of upper-outer quadrant of left female breast: Secondary | ICD-10-CM

## 2019-05-30 DIAGNOSIS — Z170 Estrogen receptor positive status [ER+]: Secondary | ICD-10-CM

## 2019-05-30 MED ORDER — PEGFILGRASTIM-JMDB 6 MG/0.6ML ~~LOC~~ SOSY
PREFILLED_SYRINGE | SUBCUTANEOUS | Status: AC
  Filled 2019-05-30: qty 0.6

## 2019-05-30 MED ORDER — PEGFILGRASTIM-JMDB 6 MG/0.6ML ~~LOC~~ SOSY
6.0000 mg | PREFILLED_SYRINGE | Freq: Once | SUBCUTANEOUS | Status: AC
  Administered 2019-05-30: 6 mg via SUBCUTANEOUS

## 2019-05-30 NOTE — Patient Instructions (Signed)

## 2019-05-31 ENCOUNTER — Ambulatory Visit: Payer: Self-pay | Admitting: Surgery

## 2019-05-31 DIAGNOSIS — C50912 Malignant neoplasm of unspecified site of left female breast: Secondary | ICD-10-CM

## 2019-05-31 NOTE — H&P (View-Only) (Signed)
History of Present Illness Paige Perry. Ammara Raj MD; 05/31/2019 1:48 PM) The patient is a 44 year old female who presents with breast cancer. BREAST [place]  This is a 44 year old female in good health who presented in October 2020 with a recent discovery of a palpable mass in her left breast. This is been present for about a month. She underwent diagnostic mammogram and ultrasound on 01/14/19. Showed a 2.3 cm mass in the left breast at 12:00. She also had a single enlarged left axillary lymph node measuring 3.5 cm. She underwent biopsy of the mass as well as the axillary lymph node. 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   On 02/05/19 she underwent ultrasound-guided port placement. She began neoadjuvant chemotherapy. She had a follow-up MRI on 05/29/19. This showed complete imaging resolution of the malignancy in the upper outer left breast. The 2 enlarged left axillary lymph nodes are markedly improved. She presents now for surgical planning after completing her chemotherapy earlier in the week.  CLINICAL DATA: 44 year old female with known left breast cancer metastatic left axillary lymphadenopathy presents for re-evaluation after neoadjuvant chemotherapy.  LABS: None performed on site.  EXAM: BILATERAL BREAST MRI WITH AND WITHOUT CONTRAST  TECHNIQUE: Multiplanar, multisequence MR images of both breasts were obtained prior to and following the intravenous administration of 9 ml of Gadavist.  Three-dimensional MR images were rendered by post-processing of the original MR data on an independent workstation. The three-dimensional MR images were interpreted, and findings are reported in the following complete MRI report for this study. Three dimensional images were evaluated at the independent DynaCad workstation  COMPARISON: Previous  exam(s).  FINDINGS: Breast composition: c. Heterogeneous fibroglandular tissue.  Background parenchymal enhancement: Moderate.  Right breast: No mass or abnormal enhancement. Note is made of port placement along the medial right chest wall.  Left breast: Susceptibility artifact from post biopsy clip is noted in the upper-outer left breast posteriorly (series 5, image 37/144). There has been complete resolution of the associated enhancing mass. No additional suspicious findings are identified in the remainder of the left breast.  Lymph nodes: Interval improvement in 2 previously markedly enlarged left axillary lymph nodes. Again no suspicious right axillary or internal mammary chain lymphadenopathy.  Ancillary findings: None.  IMPRESSION: 1. Complete imaging resolution of the known malignancy in the upper-outer left breast suggesting excellent response to chemotherapy. 2. Marked interval improvement of two prominent left axillary lymph nodes. 3. No suspicious findings on the right.  RECOMMENDATION: Per clinical treatment plan.  BI-RADS CATEGORY 6: Known biopsy-proven malignancy.   Electronically Signed By: Kristopher Oppenheim M.D. On: 05/29/2019 10:17   Problem List/Past Medical Rodman Key K. Virdie Penning, MD; 05/31/2019 1:48 PM) INVASIVE DUCTAL CARCINOMA OF LEFT BREAST, STAGE 2 (C50.912)  Diagnostic Studies History (Xaviar Lunn K. Mohsin Crum, MD; 05/31/2019 1:48 PM) Colonoscopy never Mammogram within last year Pap Smear 1-5 years ago  Allergies Mammie Lorenzo, LPN; 0/11/8117 1:47 AM) Crissie Sickles Oil *CHEMICALS*  Medication History Mammie Lorenzo, LPN; 10/27/9560 1:30 AM) Pantoprazole Sodium (40MG Tablet DR, Oral) Active. Aspirin (325MG Tablet, Oral) Active. ZyrTEC Allergy (10MG Capsule, Oral) Active. Cholecalciferol (25 MCG(1000 UT) Tablet Disint, Oral) Active. NuvaRing (0.12-0.015MG/24HR Ring, Vaginal) Active. Ibuprofen (400MG Tablet, Oral) Active. Medications  Reconciled  Social History Paige Perry. Lailoni Baquera, MD; 05/31/2019 1:48 PM) Alcohol use Recently quit alcohol use. Caffeine use Carbonated beverages. Illicit drug use Remotely quit drug use. Tobacco use  Never smoker.  Family History Paige Perry. Joziyah Roblero, MD; 05/31/2019 1:48 PM) Arthritis Mother. Breast Cancer Family Members In General. Heart Disease Family Members In General. Migraine Headache Sister.  Pregnancy / Birth History Paige Perry. Skilar Marcou, MD; 05/31/2019 1:48 PM) Age at menarche 59 years. Contraceptive History Oral contraceptives. Gravida 2 Irregular periods Maternal age 52-20 Para 2  Other Problems Paige Perry. Shailynn Fong, MD; 05/31/2019 1:48 PM) Arthritis Hypercholesterolemia    Vitals Claiborne Billings Dockery LPN; 4/0/9811 9:14 AM) 05/31/2019 9:47 AM Weight: 191.2 lb Height: 63in Body Surface Area: 1.9 m Body Mass Index: 33.87 kg/m  Temp.: 97.39F(Thermal Scan)  Pulse: 142 (Regular)  BP: 118/68 (Sitting, Left Arm, Standard)        Physical Exam Rodman Key K. Randie Tallarico MD; 05/31/2019 1:49 PM)  The physical exam findings are as follows: Note:Constitutional: WDWN in NAD, conversant, no obvious deformities; lying in bed comfortably Eyes: Pupils equal, round; sclera anicteric; moist conjunctiva; no lid lag HENT: Oral mucosa moist; good dentition Neck: No masses palpated, trachea midline; no thyromegaly Lungs: CTA bilaterally; normal respiratory effort Breasts: Symmetric, no palpable mass in either breast. No axillary lymphadenopathy. No nipple retraction or discharge. The right chest port site shows the tip of the Prolene suture protruding up in the skin. I was able to trim this area. She should put antibiotic ointment and cover with a Band-Aid for the next couple of days. CV: Regular rate and rhythm; no murmurs; extremities well-perfused with no edema Abd: +bowel sounds, soft, non-tender, no palpable organomegaly; no palpable hernias Musc: Unable to assess gait; no  apparent clubbing or cyanosis in extremities Lymphatic: No palpable cervical or axillary lymphadenopathy Skin: Warm, dry; no sign of jaundice Psychiatric - alert and oriented x 4; calm mood and affect    Assessment & Plan Rodman Key K. Vincy Feliz MD; 05/31/2019 1:49 PM)  INVASIVE DUCTAL CARCINOMA OF LEFT BREAST, STAGE 2 (C50.912)  Current Plans Schedule for Surgery - Left radioactive seed localized lumpectomy with targeted axillary lymph node dissection. The surgical procedure has been discussed with the patient. Potential risks, benefits, alternative treatments, and expected outcomes have been explained. All of the patient's questions at this time have been answered. The likelihood of reaching the patient's treatment goal is good. The patient understand the proposed surgical procedure and wishes to proceed. Note:Surgery will be followed by adjuvant radiation therapy as well as Herceptin.   Paige Perry. Georgette Dover, MD, [facility] Surgery  General/ Trauma Surgery   05/31/2019 1:50 PM

## 2019-05-31 NOTE — H&P (Signed)
History of Present Illness Paige Perry. Nickalaus Crooke MD; 05/31/2019 1:48 PM) The patient is a 44 year old female who presents with breast cancer. BREAST [place]  This is a 44 year old female in good health who presented in October 2020 with a recent discovery of a palpable mass in her left breast. This is been present for about a month. She underwent diagnostic mammogram and ultrasound on 01/14/19. Showed a 2.3 cm mass in the left breast at 12:00. She also had a single enlarged left axillary lymph node measuring 3.5 cm. She underwent biopsy of the mass as well as the axillary lymph node. 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   On 02/05/19 she underwent ultrasound-guided port placement. She began neoadjuvant chemotherapy. She had a follow-up MRI on 05/29/19. This showed complete imaging resolution of the malignancy in the upper outer left breast. The 2 enlarged left axillary lymph nodes are markedly improved. She presents now for surgical planning after completing her chemotherapy earlier in the week.  CLINICAL DATA: 44 year old female with known left breast cancer metastatic left axillary lymphadenopathy presents for re-evaluation after neoadjuvant chemotherapy.  LABS: None performed on site.  EXAM: BILATERAL BREAST MRI WITH AND WITHOUT CONTRAST  TECHNIQUE: Multiplanar, multisequence MR images of both breasts were obtained prior to and following the intravenous administration of 9 ml of Gadavist.  Three-dimensional MR images were rendered by post-processing of the original MR data on an independent workstation. The three-dimensional MR images were interpreted, and findings are reported in the following complete MRI report for this study. Three dimensional images were evaluated at the independent DynaCad workstation  COMPARISON: Previous  exam(s).  FINDINGS: Breast composition: c. Heterogeneous fibroglandular tissue.  Background parenchymal enhancement: Moderate.  Right breast: No mass or abnormal enhancement. Note is made of port placement along the medial right chest wall.  Left breast: Susceptibility artifact from post biopsy clip is noted in the upper-outer left breast posteriorly (series 5, image 37/144). There has been complete resolution of the associated enhancing mass. No additional suspicious findings are identified in the remainder of the left breast.  Lymph nodes: Interval improvement in 2 previously markedly enlarged left axillary lymph nodes. Again no suspicious right axillary or internal mammary chain lymphadenopathy.  Ancillary findings: None.  IMPRESSION: 1. Complete imaging resolution of the known malignancy in the upper-outer left breast suggesting excellent response to chemotherapy. 2. Marked interval improvement of two prominent left axillary lymph nodes. 3. No suspicious findings on the right.  RECOMMENDATION: Per clinical treatment plan.  BI-RADS CATEGORY 6: Known biopsy-proven malignancy.   Electronically Signed By: Kristopher Oppenheim M.D. On: 05/29/2019 10:17   Problem List/Past Medical Rodman Key K. Carlas Vandyne, MD; 05/31/2019 1:48 PM) INVASIVE DUCTAL CARCINOMA OF LEFT BREAST, STAGE 2 (C50.912)  Diagnostic Studies History (Calley Drenning K. Britany Callicott, MD; 05/31/2019 1:48 PM) Colonoscopy never Mammogram within last year Pap Smear 1-5 years ago  Allergies Mammie Lorenzo, LPN; 2/0/9470 9:62 AM) Crissie Sickles Oil *CHEMICALS*  Medication History Mammie Lorenzo, LPN; 10/29/6627 4:76 AM) Pantoprazole Sodium (40MG Tablet DR, Oral) Active. Aspirin (325MG Tablet, Oral) Active. ZyrTEC Allergy (10MG Capsule, Oral) Active. Cholecalciferol (25 MCG(1000 UT) Tablet Disint, Oral) Active. NuvaRing (0.12-0.015MG/24HR Ring, Vaginal) Active. Ibuprofen (400MG Tablet, Oral) Active. Medications  Reconciled  Social History Paige Perry. Wolfe Camarena, MD; 05/31/2019 1:48 PM) Alcohol use Recently quit alcohol use. Caffeine use Carbonated beverages. Illicit drug use Remotely quit drug use. Tobacco use  Never smoker.  Family History Paige Perry. Kahlel Peake, MD; 05/31/2019 1:48 PM) Arthritis Mother. Breast Cancer Family Members In General. Heart Disease Family Members In General. Migraine Headache Sister.  Pregnancy / Birth History Paige Perry. Aalani Aikens, MD; 05/31/2019 1:48 PM) Age at menarche 21 years. Contraceptive History Oral contraceptives. Gravida 2 Irregular periods Maternal age 6-20 Para 2  Other Problems Paige Perry. Jaion Lagrange, MD; 05/31/2019 1:48 PM) Arthritis Hypercholesterolemia    Vitals Claiborne Billings Dockery LPN; 0/09/8673 4:49 AM) 05/31/2019 9:47 AM Weight: 191.2 lb Height: 63in Body Surface Area: 1.9 m Body Mass Index: 33.87 kg/m  Temp.: 97.75F(Thermal Scan)  Pulse: 142 (Regular)  BP: 118/68 (Sitting, Left Arm, Standard)        Physical Exam Rodman Key K. Zymeir Salminen MD; 05/31/2019 1:49 PM)  The physical exam findings are as follows: Note:Constitutional: WDWN in NAD, conversant, no obvious deformities; lying in bed comfortably Eyes: Pupils equal, round; sclera anicteric; moist conjunctiva; no lid lag HENT: Oral mucosa moist; good dentition Neck: No masses palpated, trachea midline; no thyromegaly Lungs: CTA bilaterally; normal respiratory effort Breasts: Symmetric, no palpable mass in either breast. No axillary lymphadenopathy. No nipple retraction or discharge. The right chest port site shows the tip of the Prolene suture protruding up in the skin. I was able to trim this area. She should put antibiotic ointment and cover with a Band-Aid for the next couple of days. CV: Regular rate and rhythm; no murmurs; extremities well-perfused with no edema Abd: +bowel sounds, soft, non-tender, no palpable organomegaly; no palpable hernias Musc: Unable to assess gait; no  apparent clubbing or cyanosis in extremities Lymphatic: No palpable cervical or axillary lymphadenopathy Skin: Warm, dry; no sign of jaundice Psychiatric - alert and oriented x 4; calm mood and affect    Assessment & Plan Rodman Key K. Ashantae Pangallo MD; 05/31/2019 1:49 PM)  INVASIVE DUCTAL CARCINOMA OF LEFT BREAST, STAGE 2 (C50.912)  Current Plans Schedule for Surgery - Left radioactive seed localized lumpectomy with targeted axillary lymph node dissection. The surgical procedure has been discussed with the patient. Potential risks, benefits, alternative treatments, and expected outcomes have been explained. All of the patient's questions at this time have been answered. The likelihood of reaching the patient's treatment goal is good. The patient understand the proposed surgical procedure and wishes to proceed. Note:Surgery will be followed by adjuvant radiation therapy as well as Herceptin.   Paige Perry. Georgette Dover, MD, [facility] Surgery  General/ Trauma Surgery   05/31/2019 1:50 PM

## 2019-06-04 ENCOUNTER — Other Ambulatory Visit: Payer: Self-pay | Admitting: Surgery

## 2019-06-04 DIAGNOSIS — C50912 Malignant neoplasm of unspecified site of left female breast: Secondary | ICD-10-CM

## 2019-06-05 ENCOUNTER — Encounter: Payer: Self-pay | Admitting: *Deleted

## 2019-06-05 ENCOUNTER — Other Ambulatory Visit: Payer: Self-pay

## 2019-06-05 ENCOUNTER — Encounter (HOSPITAL_BASED_OUTPATIENT_CLINIC_OR_DEPARTMENT_OTHER): Payer: Self-pay | Admitting: Surgery

## 2019-06-06 ENCOUNTER — Telehealth: Payer: Self-pay | Admitting: Hematology and Oncology

## 2019-06-06 NOTE — Telephone Encounter (Signed)
Scheduled appt per 3/10 sch message - pt aware of appt date and time   

## 2019-06-10 ENCOUNTER — Other Ambulatory Visit (HOSPITAL_COMMUNITY)
Admission: RE | Admit: 2019-06-10 | Discharge: 2019-06-10 | Disposition: A | Discharge status: Home / Self Care | Payer: BC Managed Care – PPO | Source: Ambulatory Visit | Attending: Surgery | Admitting: Surgery

## 2019-06-10 DIAGNOSIS — Z01812 Encounter for preprocedural laboratory examination: Secondary | ICD-10-CM | POA: Insufficient documentation

## 2019-06-10 DIAGNOSIS — Z20822 Contact with and (suspected) exposure to covid-19: Secondary | ICD-10-CM | POA: Insufficient documentation

## 2019-06-10 LAB — SARS CORONAVIRUS 2 (TAT 6-24 HRS): SARS Coronavirus 2: NEGATIVE

## 2019-06-12 ENCOUNTER — Other Ambulatory Visit: Payer: Self-pay

## 2019-06-12 ENCOUNTER — Ambulatory Visit
Admission: RE | Admit: 2019-06-12 | Discharge: 2019-06-12 | Disposition: A | Discharge status: Home / Self Care | Payer: BC Managed Care – PPO | Source: Ambulatory Visit | Attending: Surgery | Admitting: Surgery

## 2019-06-12 ENCOUNTER — Other Ambulatory Visit: Payer: Self-pay | Admitting: Surgery

## 2019-06-12 DIAGNOSIS — C50912 Malignant neoplasm of unspecified site of left female breast: Secondary | ICD-10-CM

## 2019-06-12 IMAGING — US US PLC BREAST LOC DEV 1ST LESION INC US GUIDE*L*
1 series · 4 of 4 positions shown · non-contrast
Comparison: Previous exam(s).

CLINICAL DATA: Patient presents for ultrasound-guided placement of
seed in the LEFT axilla. Patient has undergone neoadjuvant treatment
for metastatic RIGHT breast cancer.

EXAM:
ULTRASOUND GUIDED RADIOACTIVE SEED LOCALIZATION OF THE RIGHT BREAST

[Series 1: us plc breast loc dev 1st lesion inc us guide*left · 0.07mm/px · 4 of 4 slices shown]
[im 1/4]
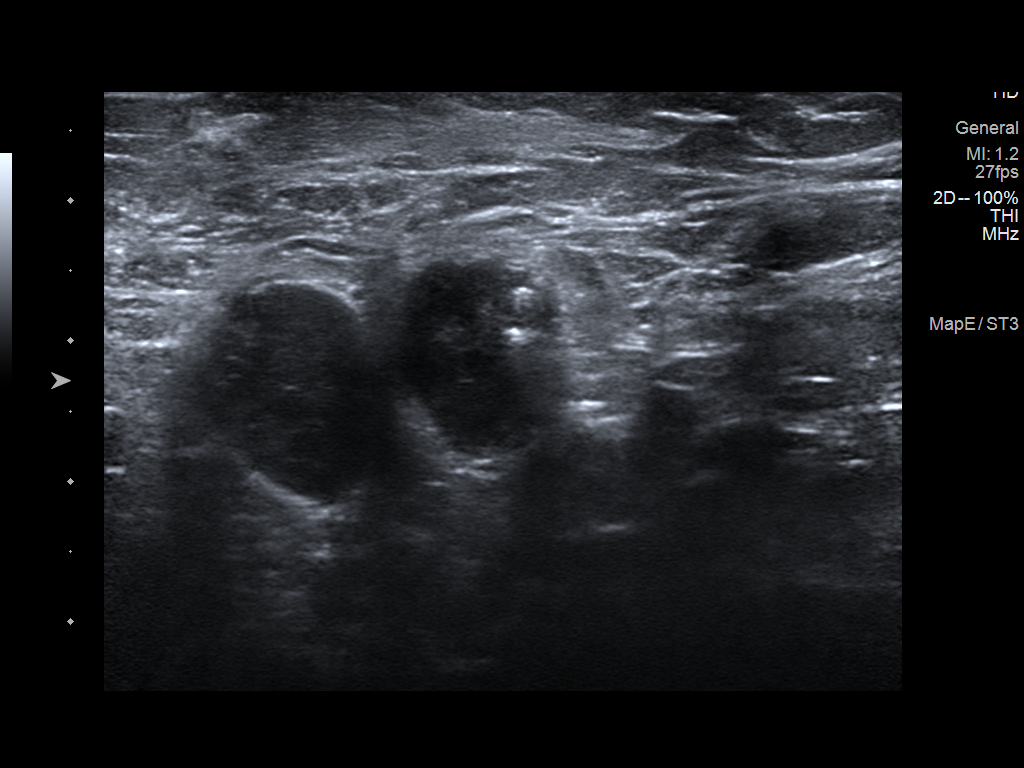
[im 2/4]
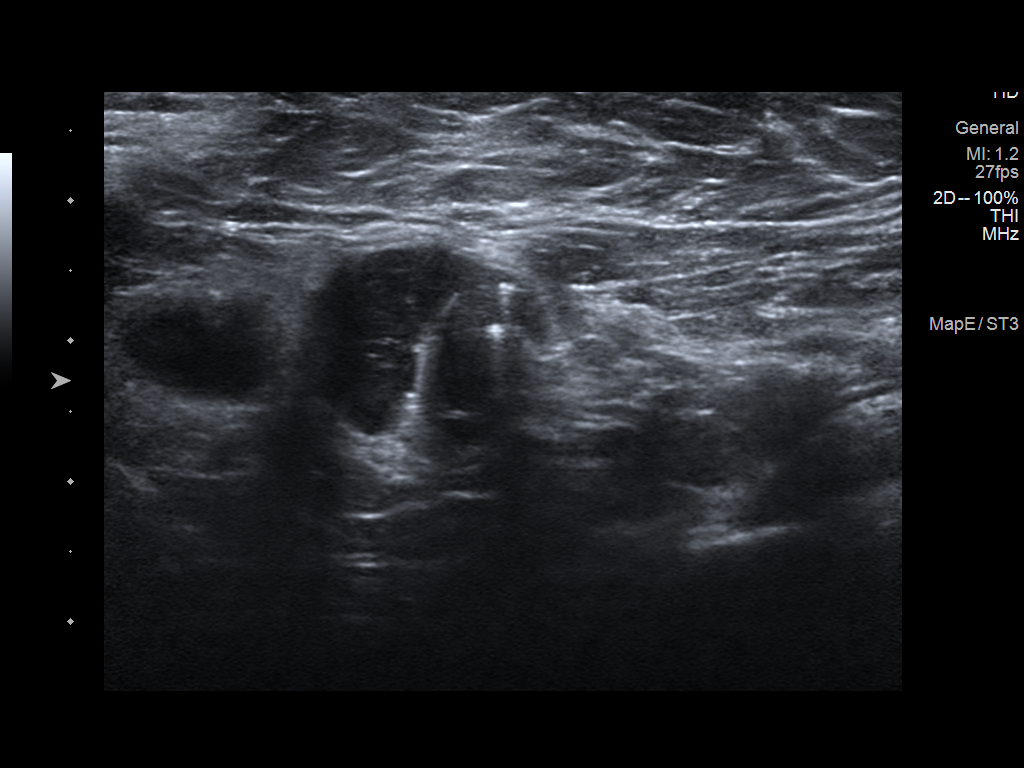
[im 3/4]
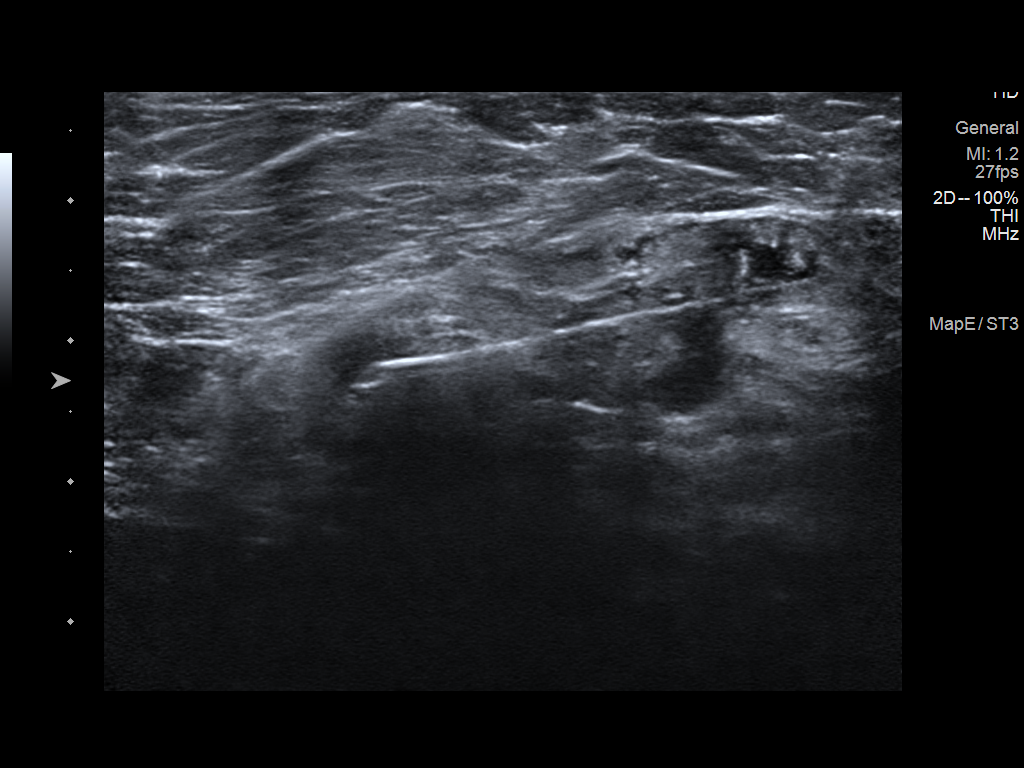
[im 4/4]
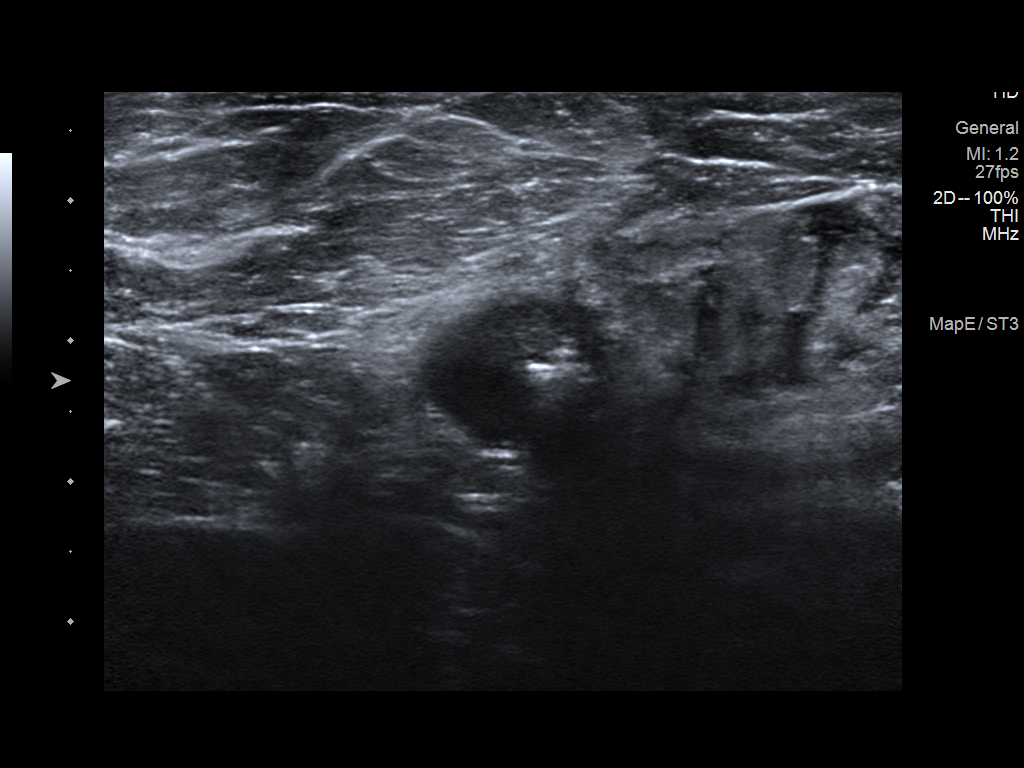

[4 of 4 positions shown; findings below may reference images not displayed]

FINDINGS: Patient presents for radioactive seed localization prior to
lumpectomy and axillary dissection. I met with the patient and we
discussed the procedure of seed localization including benefits and
alternatives. We discussed the high likelihood of a successful
procedure. We discussed the risks of the procedure including
infection, bleeding, tissue injury and further surgery. We discussed
the low dose of radioactivity involved in the procedure. Informed,
written consent was given.

The usual time-out protocol was performed immediately prior to the
procedure.

Using ultrasound guidance, sterile technique, 1% lidocaine and an
[PC] radioactive seed, the Q shaped clip in the LEFT axilla was
localized using a LATERAL to MEDIAL approach. The follow-up
mammogram images confirm the seed in the expected location and were
marked for Dr. MIRKA.

Follow-up survey of the patient confirms presence of the radioactive
seed.

Order number of [PC] seed:  [PHONE_NUMBER].

Total activity:  0.242 millicuries reference Date: [DATE]

The patient tolerated the procedure well and was released from the
[REDACTED]. She was given instructions regarding seed removal.
IMPRESSION: Radioactive seed localization left breast. No apparent
complications.

## 2019-06-12 IMAGING — MG MM PLC BREAST LOC DEV 1ST LESION INC MAMMO GUIDE*L*
8 of 10 series · 8 of 10 positions shown · non-contrast
Comparison: Previous exam(s).

CLINICAL DATA: Patient presents for seed localization of LEFT
breast lesion prior to lumpectomy. Patient has undergone neoadjuvant
treatment since diagnosis of LEFT breast cancer with metastasis to
LEFT axillary lymph node in [DATE].

EXAM:
MAMMOGRAPHIC GUIDED RADIOACTIVE SEED LOCALIZATION OF THE LEFT BREAST

[L ML (1 of 4)]
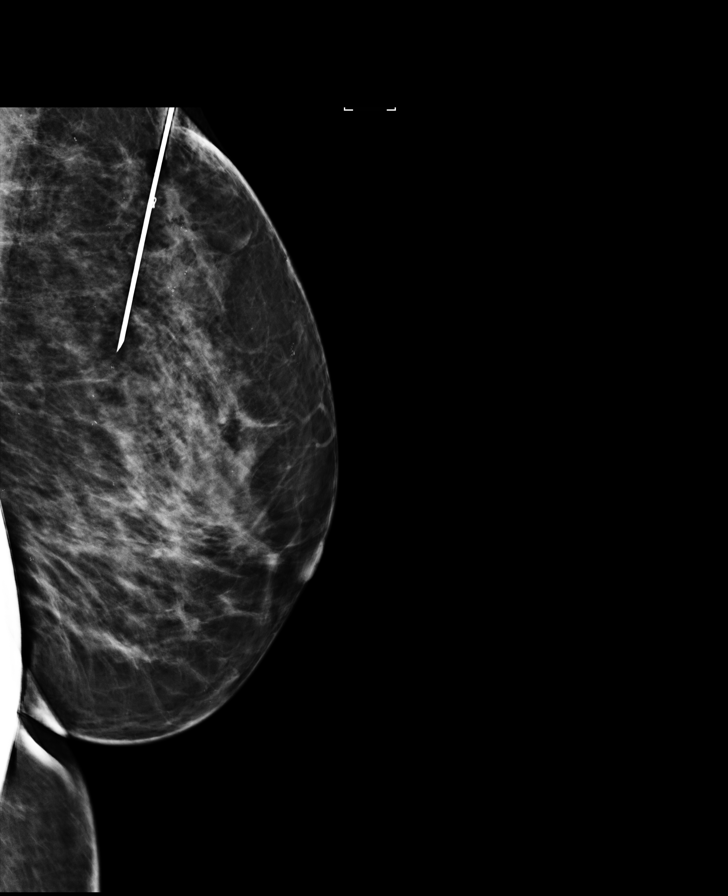

[L ML (2 of 4)]
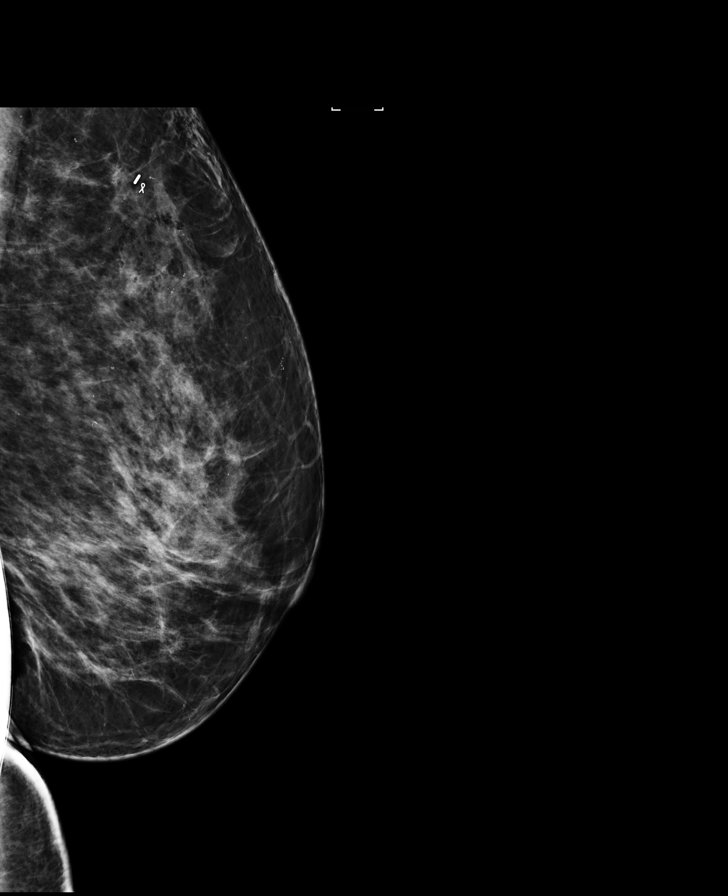

[L ML (3 of 4)]
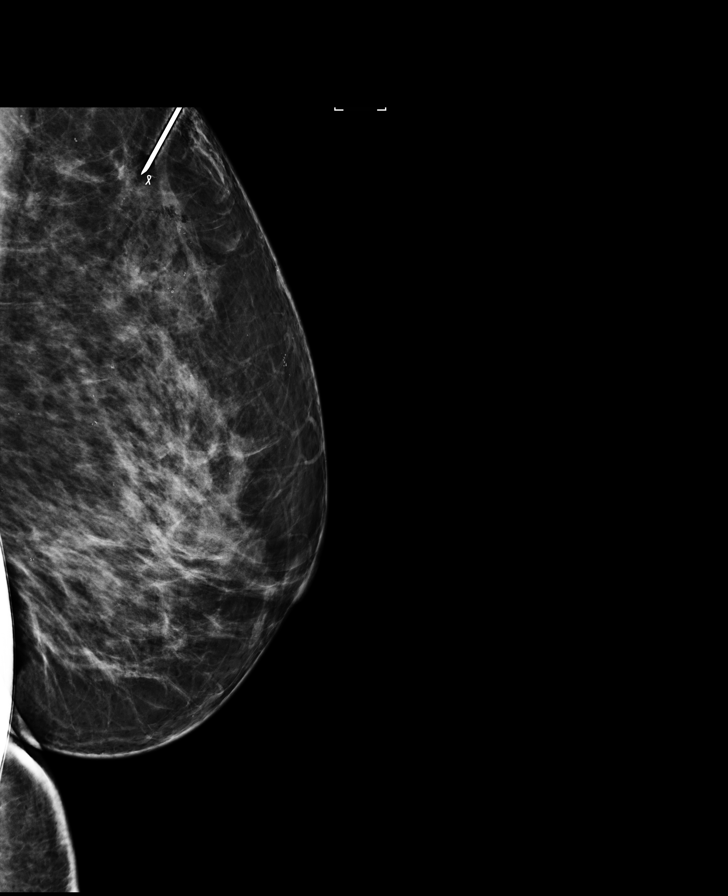

[L MLO]
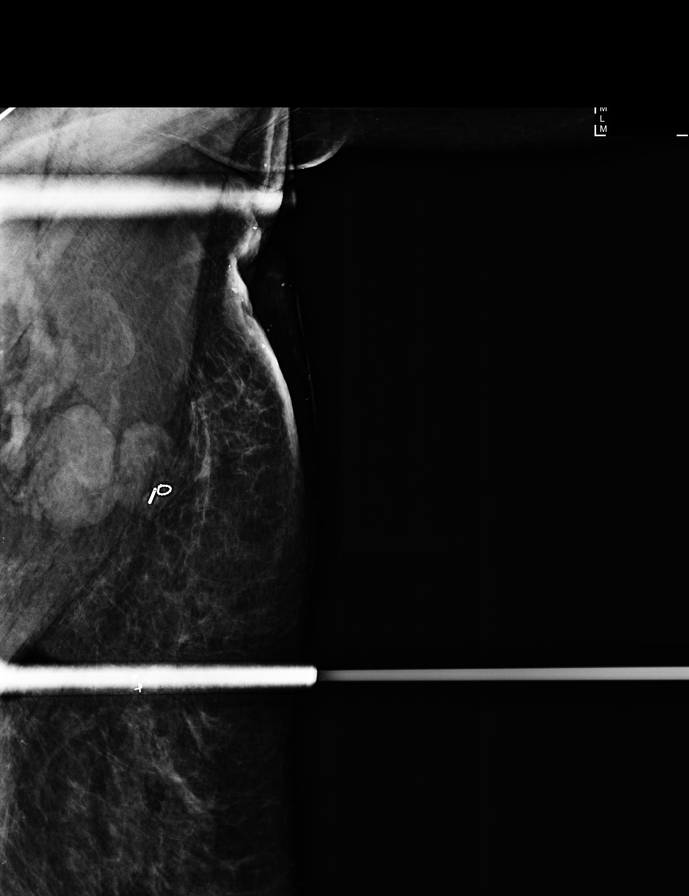

[L ML (4 of 4)]
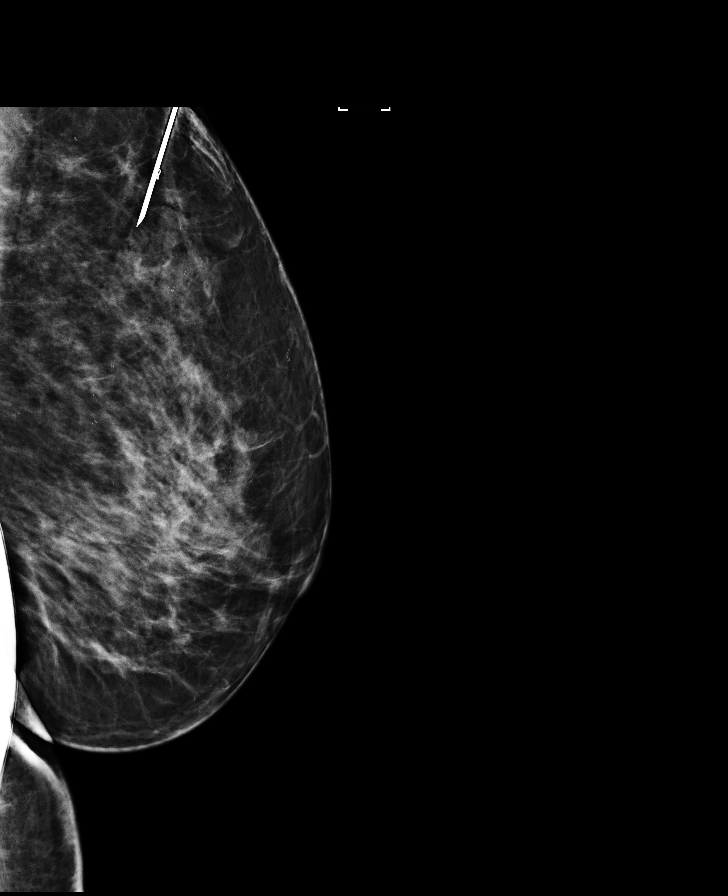

[L CC (1 of 3)]
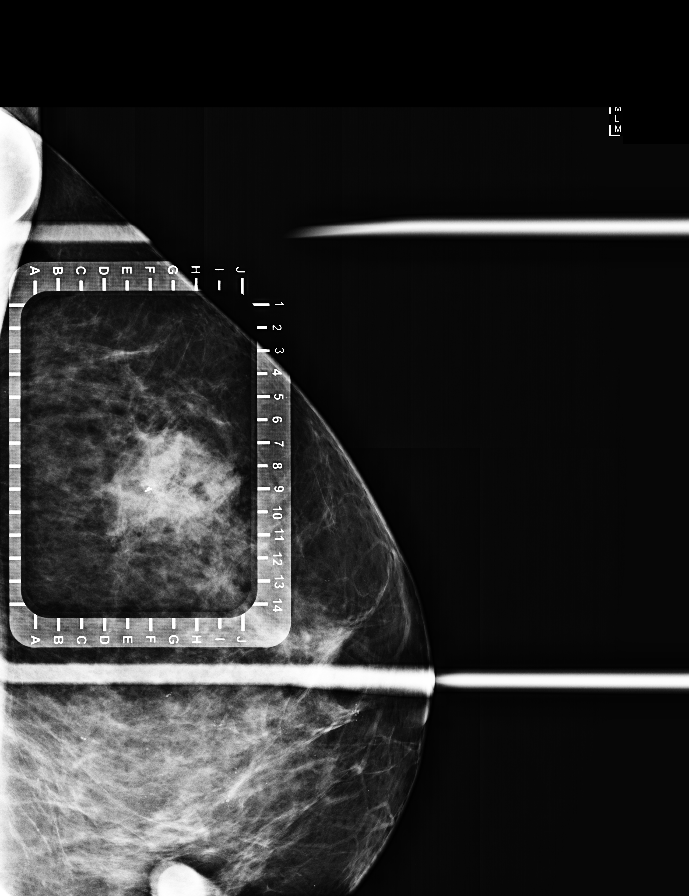

[L CC (2 of 3)]
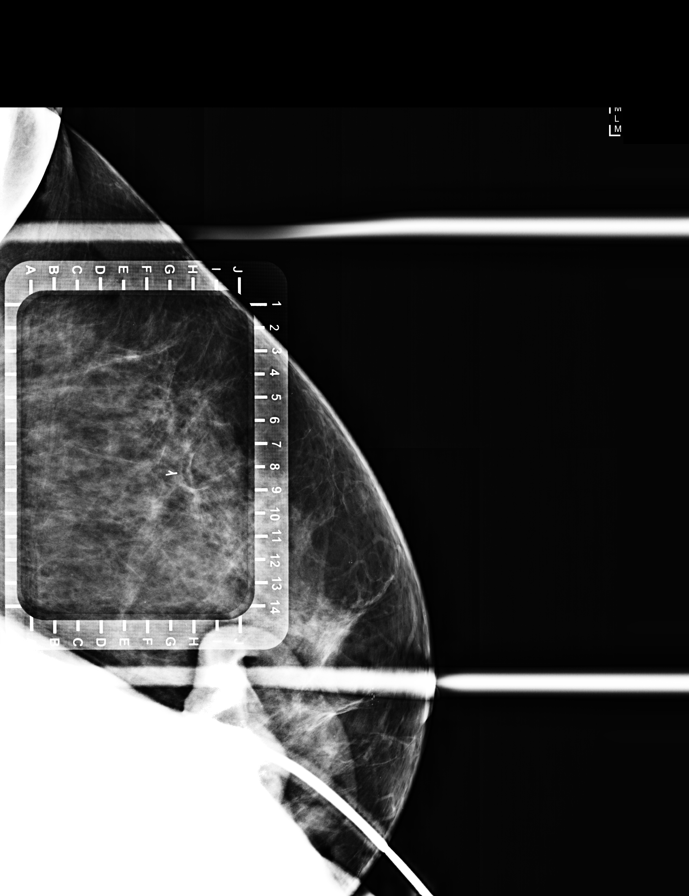

[L CC (3 of 3)]
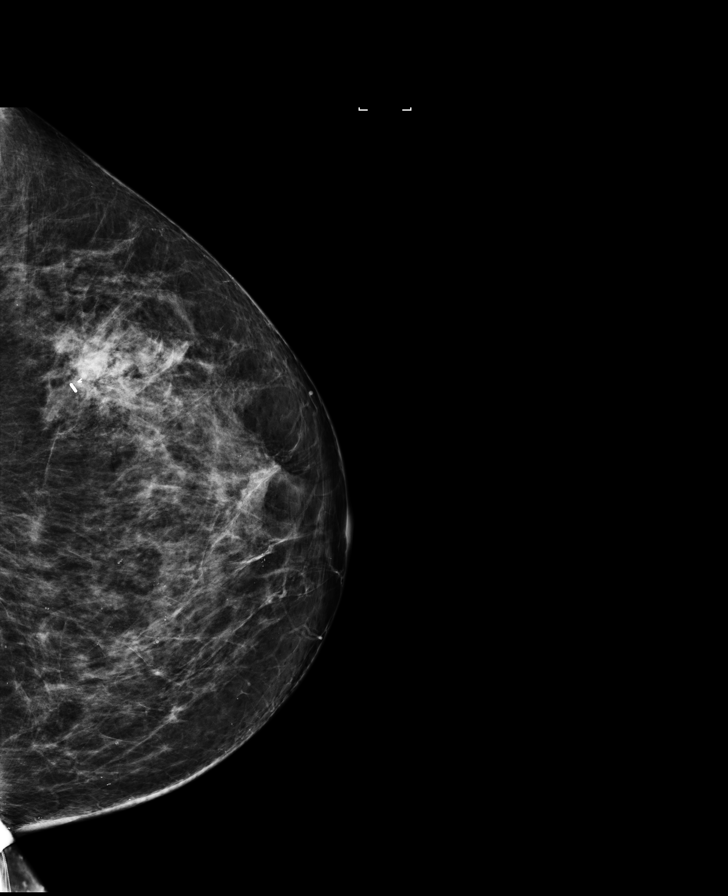

[8 of 10 positions shown; findings below may reference images not displayed]

FINDINGS: Patient presents for radioactive seed localization prior to
lumpectomy. I met with the patient and we discussed the procedure of
seed localization including benefits and alternatives. We discussed
the high likelihood of a successful procedure. We discussed the
risks of the procedure including infection, bleeding, tissue injury
and further surgery. We discussed the low dose of radioactivity
involved in the procedure. Informed, written consent was given.

The usual time-out protocol was performed immediately prior to the
procedure.

Using mammographic guidance, sterile technique, 1% lidocaine and an
[1N] radioactive seed, the ribbon shaped clip in the UPPER-OUTER
QUADRANT of the LEFT breast was localized using a craniocaudal
approach. The follow-up mammogram images confirm the seed in the
expected location and were marked for Dr. EOSEF.

Follow-up survey of the patient confirms presence of the radioactive
seed.

Order number of [1N] seed:  [PHONE_NUMBER].

Total activity:  0.242 millicuries reference Date: [DATE]

The patient tolerated the procedure well and was released from the
[REDACTED]. She was given instructions regarding seed removal.
IMPRESSION: Radioactive seed localization left breast. No apparent
complications.

## 2019-06-12 MED ORDER — ENSURE PRE-SURGERY PO LIQD: 296.0000 mL | Freq: Once | ORAL | Status: DC

## 2019-06-12 NOTE — Progress Notes (Signed)

## 2019-06-13 ENCOUNTER — Encounter: Payer: Self-pay | Admitting: *Deleted

## 2019-06-13 ENCOUNTER — Other Ambulatory Visit: Payer: Self-pay

## 2019-06-13 ENCOUNTER — Ambulatory Visit (HOSPITAL_BASED_OUTPATIENT_CLINIC_OR_DEPARTMENT_OTHER): Payer: BC Managed Care – PPO | Admitting: Anesthesiology

## 2019-06-13 ENCOUNTER — Ambulatory Visit
Admission: RE | Admit: 2019-06-13 | Discharge: 2019-06-13 | Disposition: A | Discharge status: Home / Self Care | Payer: BC Managed Care – PPO | Source: Ambulatory Visit | Attending: Surgery | Admitting: Surgery

## 2019-06-13 ENCOUNTER — Ambulatory Visit (HOSPITAL_BASED_OUTPATIENT_CLINIC_OR_DEPARTMENT_OTHER)
Admission: RE | Admit: 2019-06-13 | Discharge: 2019-06-13 | Disposition: A | Discharge status: Home / Self Care | Payer: BC Managed Care – PPO | Attending: Surgery | Admitting: Surgery

## 2019-06-13 ENCOUNTER — Encounter (HOSPITAL_BASED_OUTPATIENT_CLINIC_OR_DEPARTMENT_OTHER): Payer: Self-pay | Admitting: Surgery

## 2019-06-13 ENCOUNTER — Encounter (HOSPITAL_BASED_OUTPATIENT_CLINIC_OR_DEPARTMENT_OTHER)
Admission: RE | Disposition: A | Discharge status: Home / Self Care | Payer: Self-pay | Source: Home / Self Care | Attending: Surgery

## 2019-06-13 DIAGNOSIS — K219 Gastro-esophageal reflux disease without esophagitis: Secondary | ICD-10-CM | POA: Insufficient documentation

## 2019-06-13 DIAGNOSIS — Z6834 Body mass index (BMI) 34.0-34.9, adult: Secondary | ICD-10-CM | POA: Diagnosis not present

## 2019-06-13 DIAGNOSIS — Z9221 Personal history of antineoplastic chemotherapy: Secondary | ICD-10-CM | POA: Insufficient documentation

## 2019-06-13 DIAGNOSIS — Z79899 Other long term (current) drug therapy: Secondary | ICD-10-CM | POA: Diagnosis not present

## 2019-06-13 DIAGNOSIS — Z793 Long term (current) use of hormonal contraceptives: Secondary | ICD-10-CM | POA: Diagnosis not present

## 2019-06-13 DIAGNOSIS — Z170 Estrogen receptor positive status [ER+]: Secondary | ICD-10-CM | POA: Insufficient documentation

## 2019-06-13 DIAGNOSIS — E669 Obesity, unspecified: Secondary | ICD-10-CM | POA: Insufficient documentation

## 2019-06-13 DIAGNOSIS — C50412 Malignant neoplasm of upper-outer quadrant of left female breast: Secondary | ICD-10-CM | POA: Insufficient documentation

## 2019-06-13 DIAGNOSIS — C50912 Malignant neoplasm of unspecified site of left female breast: Secondary | ICD-10-CM

## 2019-06-13 DIAGNOSIS — Z7982 Long term (current) use of aspirin: Secondary | ICD-10-CM | POA: Insufficient documentation

## 2019-06-13 DIAGNOSIS — C773 Secondary and unspecified malignant neoplasm of axilla and upper limb lymph nodes: Secondary | ICD-10-CM | POA: Diagnosis not present

## 2019-06-13 HISTORY — DX: Other specified postprocedural states: Z98.890

## 2019-06-13 HISTORY — PX: BREAST LUMPECTOMY WITH RADIOACTIVE SEED AND AXILLARY LYMPH NODE DISSECTION: SHX6656

## 2019-06-13 HISTORY — DX: Nausea with vomiting, unspecified: R11.2

## 2019-06-13 HISTORY — DX: Unspecified asthma, uncomplicated: J45.909

## 2019-06-13 HISTORY — PX: WOUND EXPLORATION: SHX6188

## 2019-06-13 LAB — POCT PREGNANCY, URINE: Preg Test, Ur: NEGATIVE

## 2019-06-13 IMAGING — DX MM BREAST SURGICAL SPECIMEN
1 series · 2 of 2 positions shown · non-contrast
Comparison: Previous exam(s).

CLINICAL DATA: Evaluate specimen

EXAM:
SPECIMEN RADIOGRAPH OF THE LEFT BREAST

[Series 2: specimen digital x-ray, derived · left · 0.10mm/px · 2 of 2 slices shown]
[im 1/2]
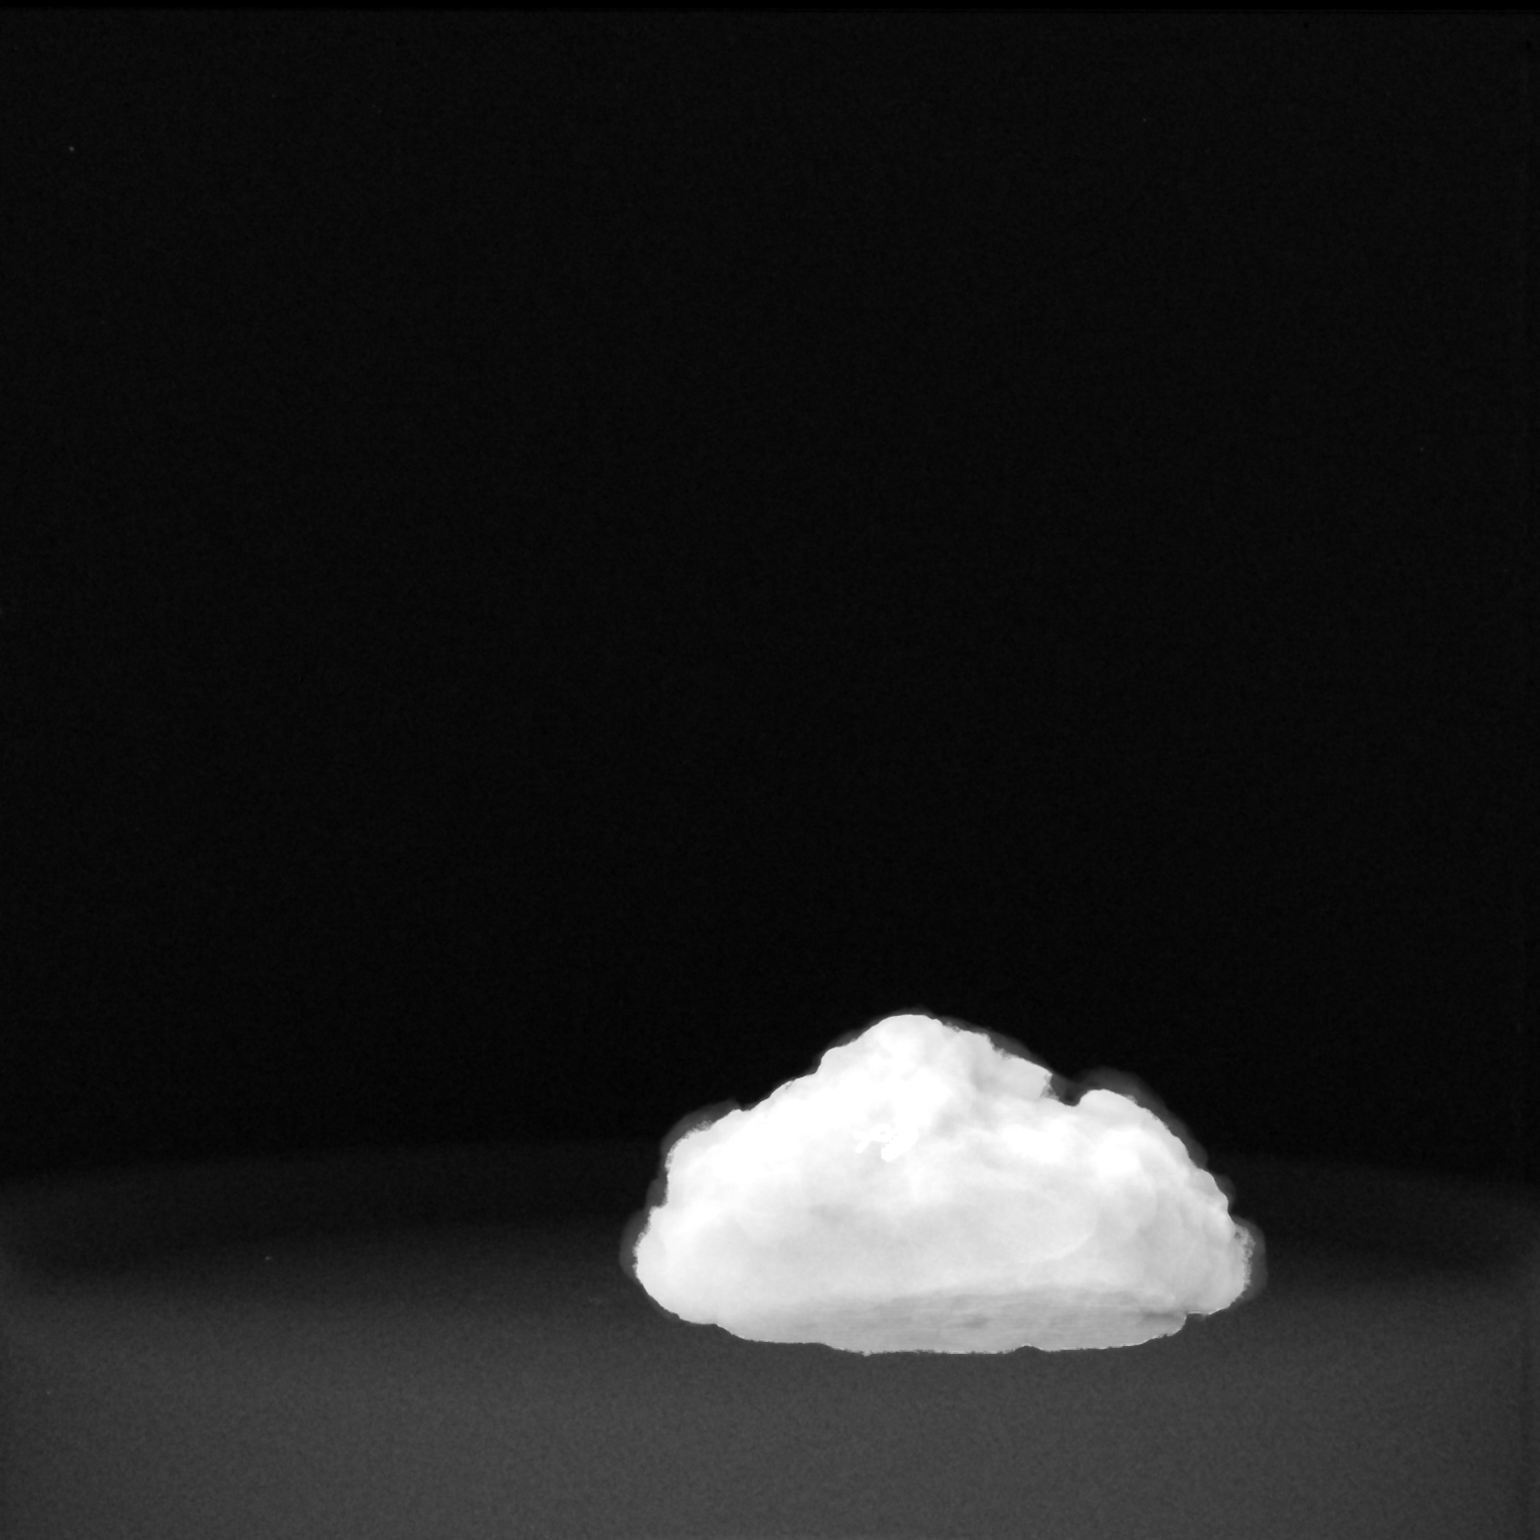
[im 2/2]
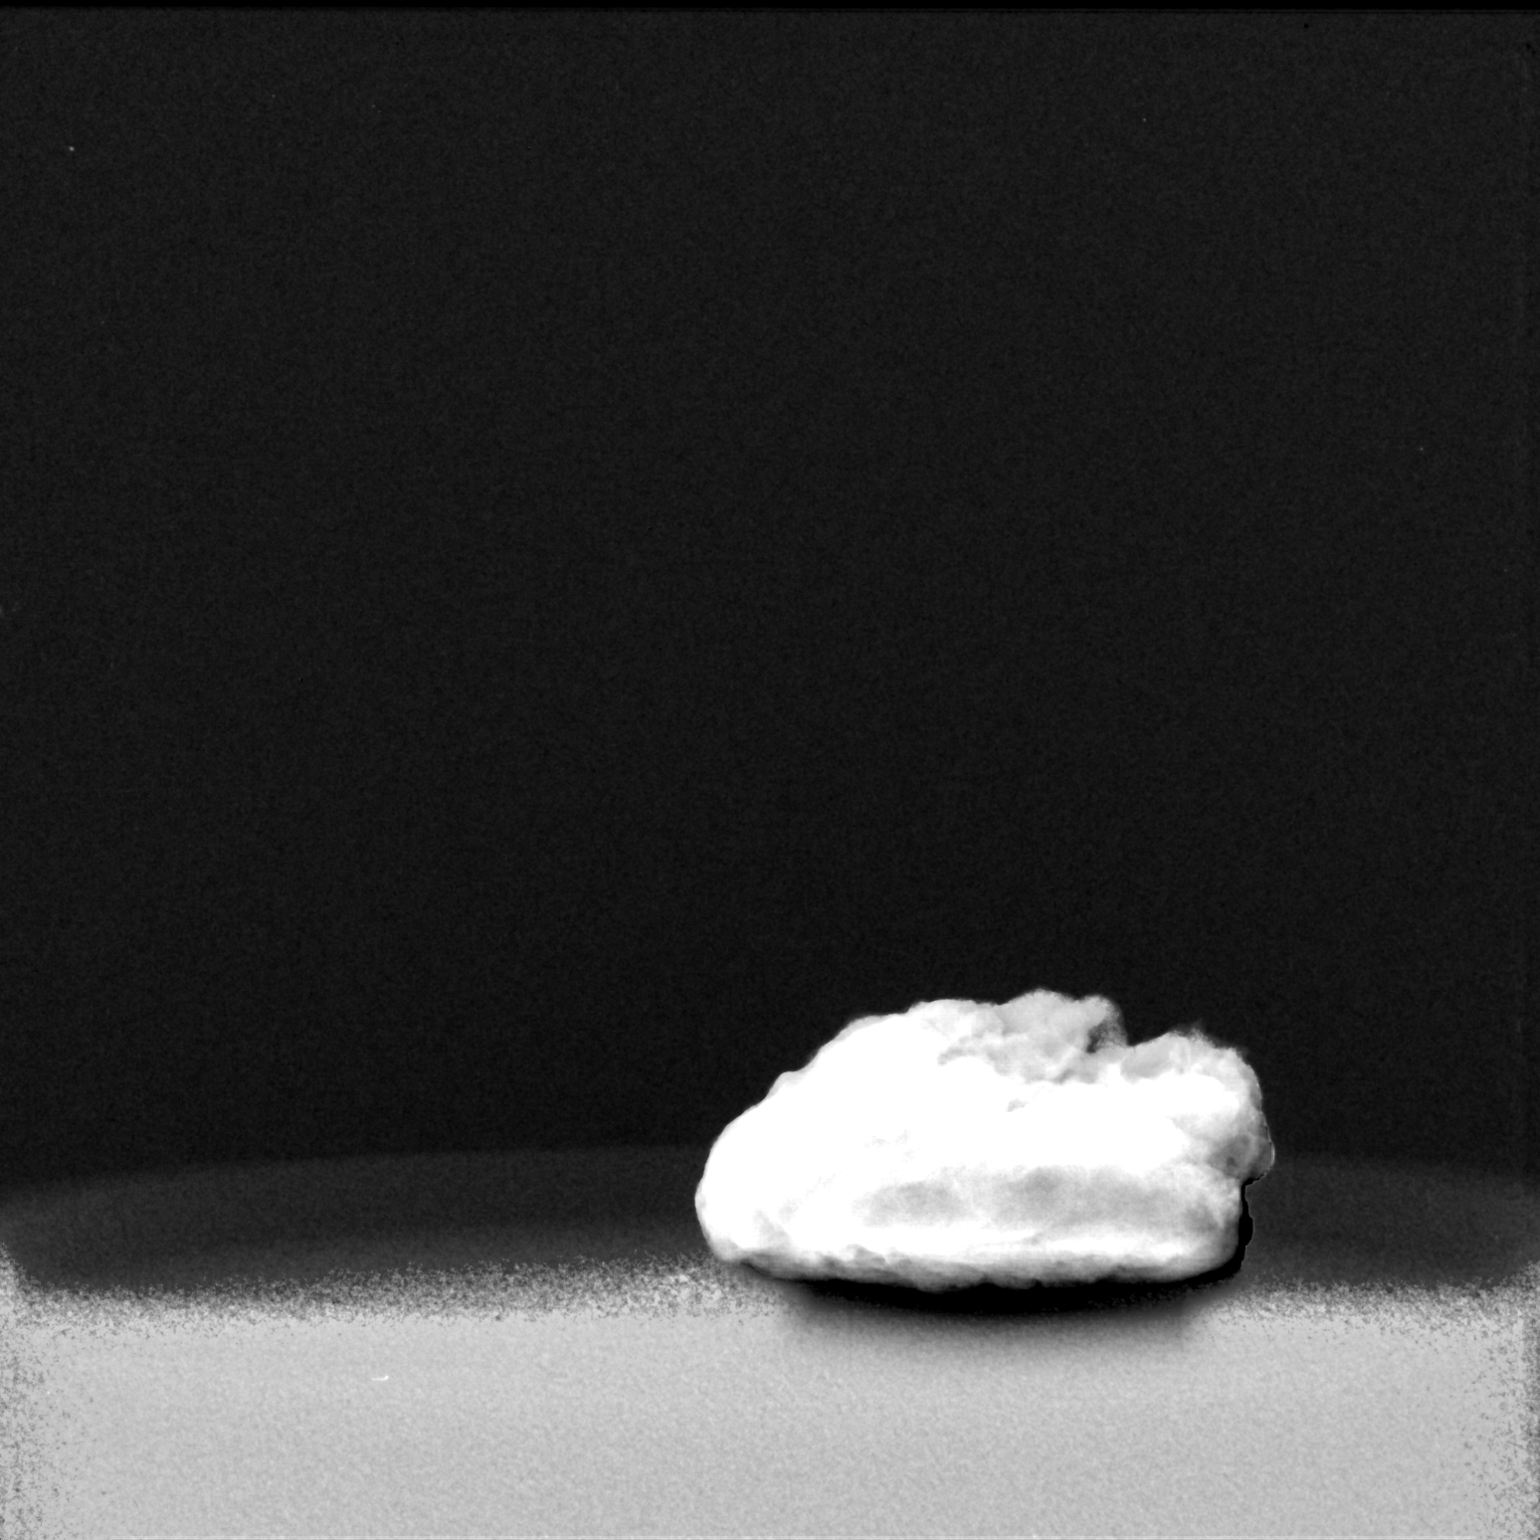

[2 of 2 positions shown; findings below may reference images not displayed]

FINDINGS: Status post excision of the left breast and left axilla. The
radioactive seed and biopsy marker clips are present, completely
intact, and were marked for pathology.
IMPRESSION: Specimen radiograph of the left breast.

## 2019-06-13 IMAGING — DX MM BREAST SURGICAL SPECIMEN
1 series · 2 of 2 positions shown · non-contrast
Comparison: Previous exam(s).

CLINICAL DATA: Evaluate specimen

EXAM:
SPECIMEN RADIOGRAPH OF THE LEFT BREAST

[Series 1: specimen digital x-ray · left · 0.10mm/px · 2 of 2 slices shown]
[im 1/2]
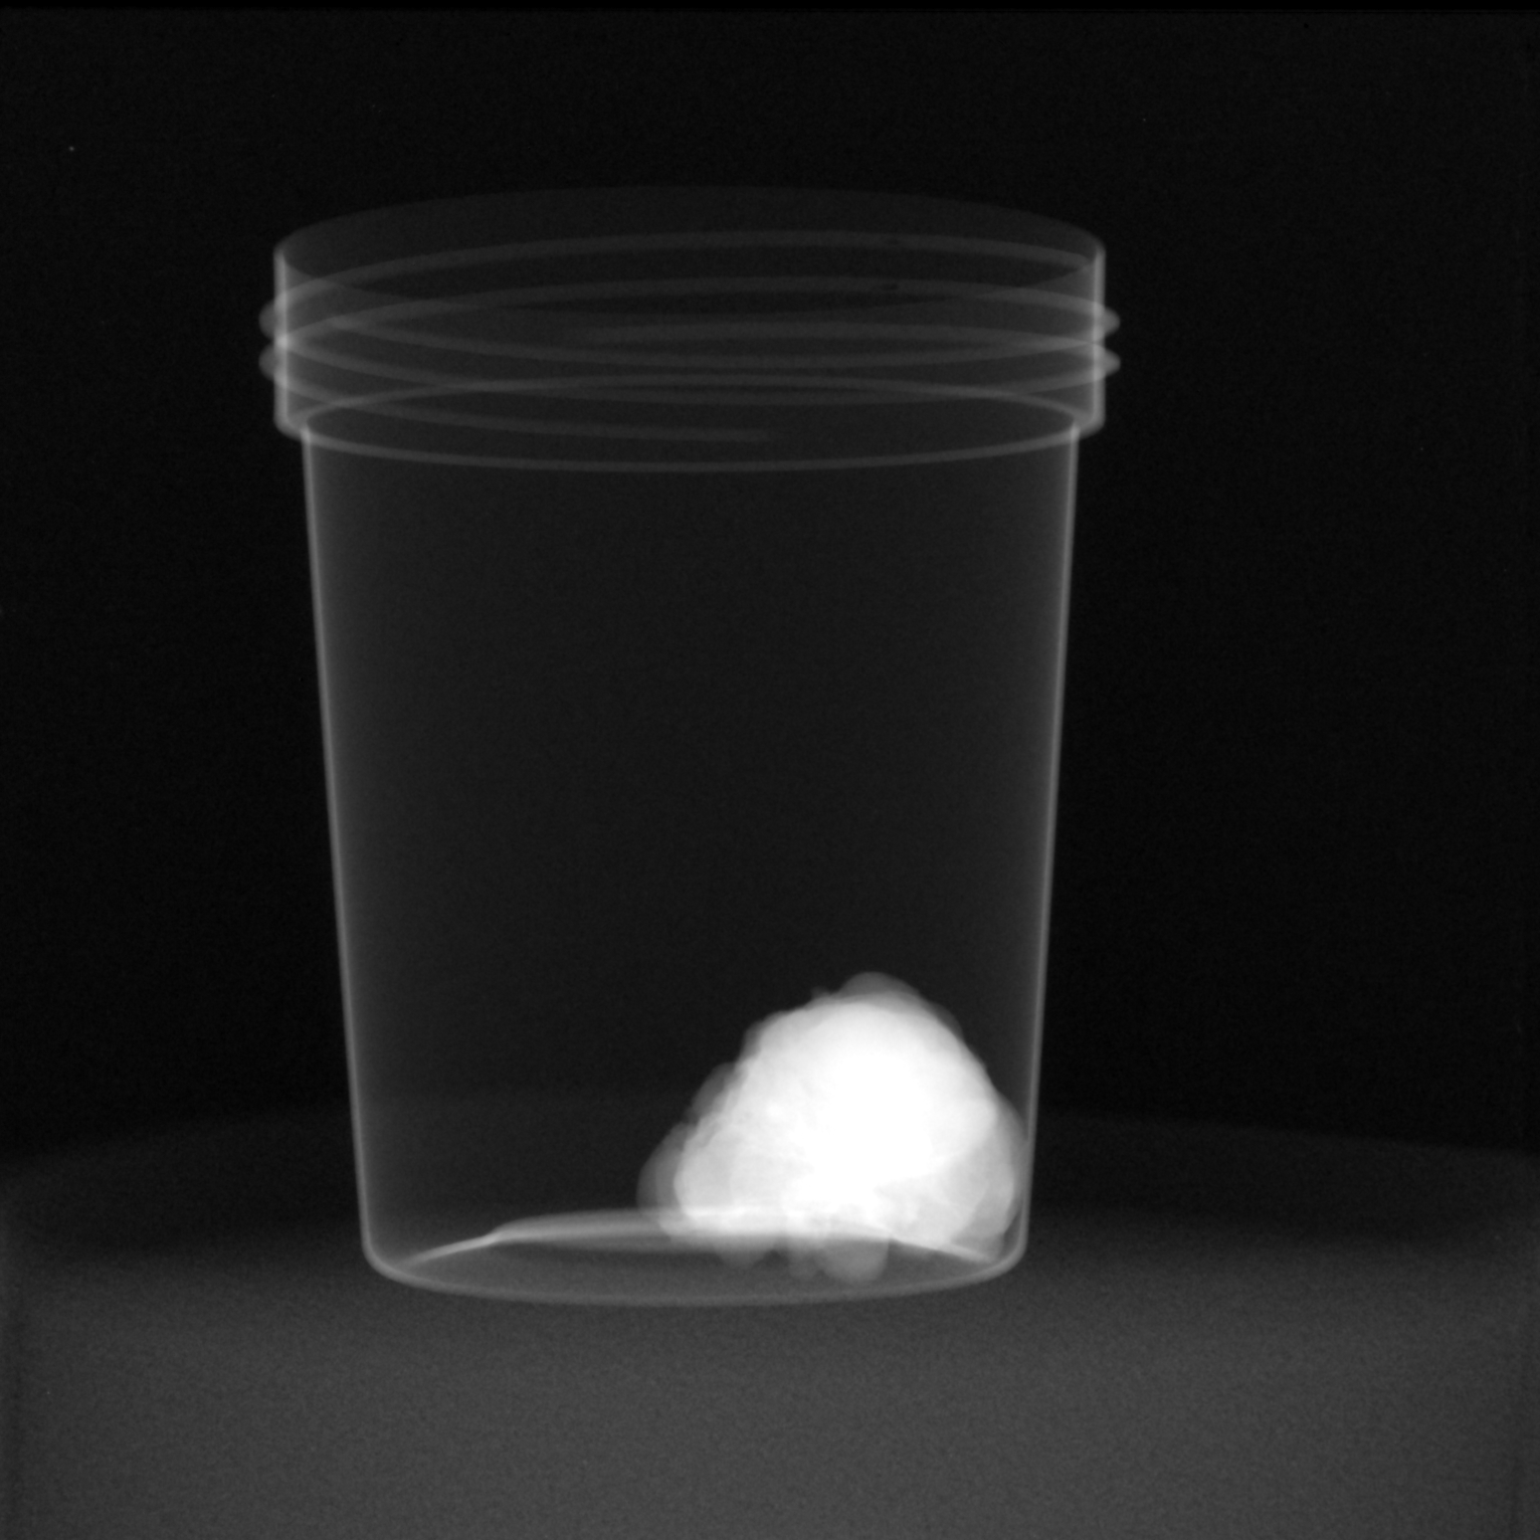
[im 2/2]
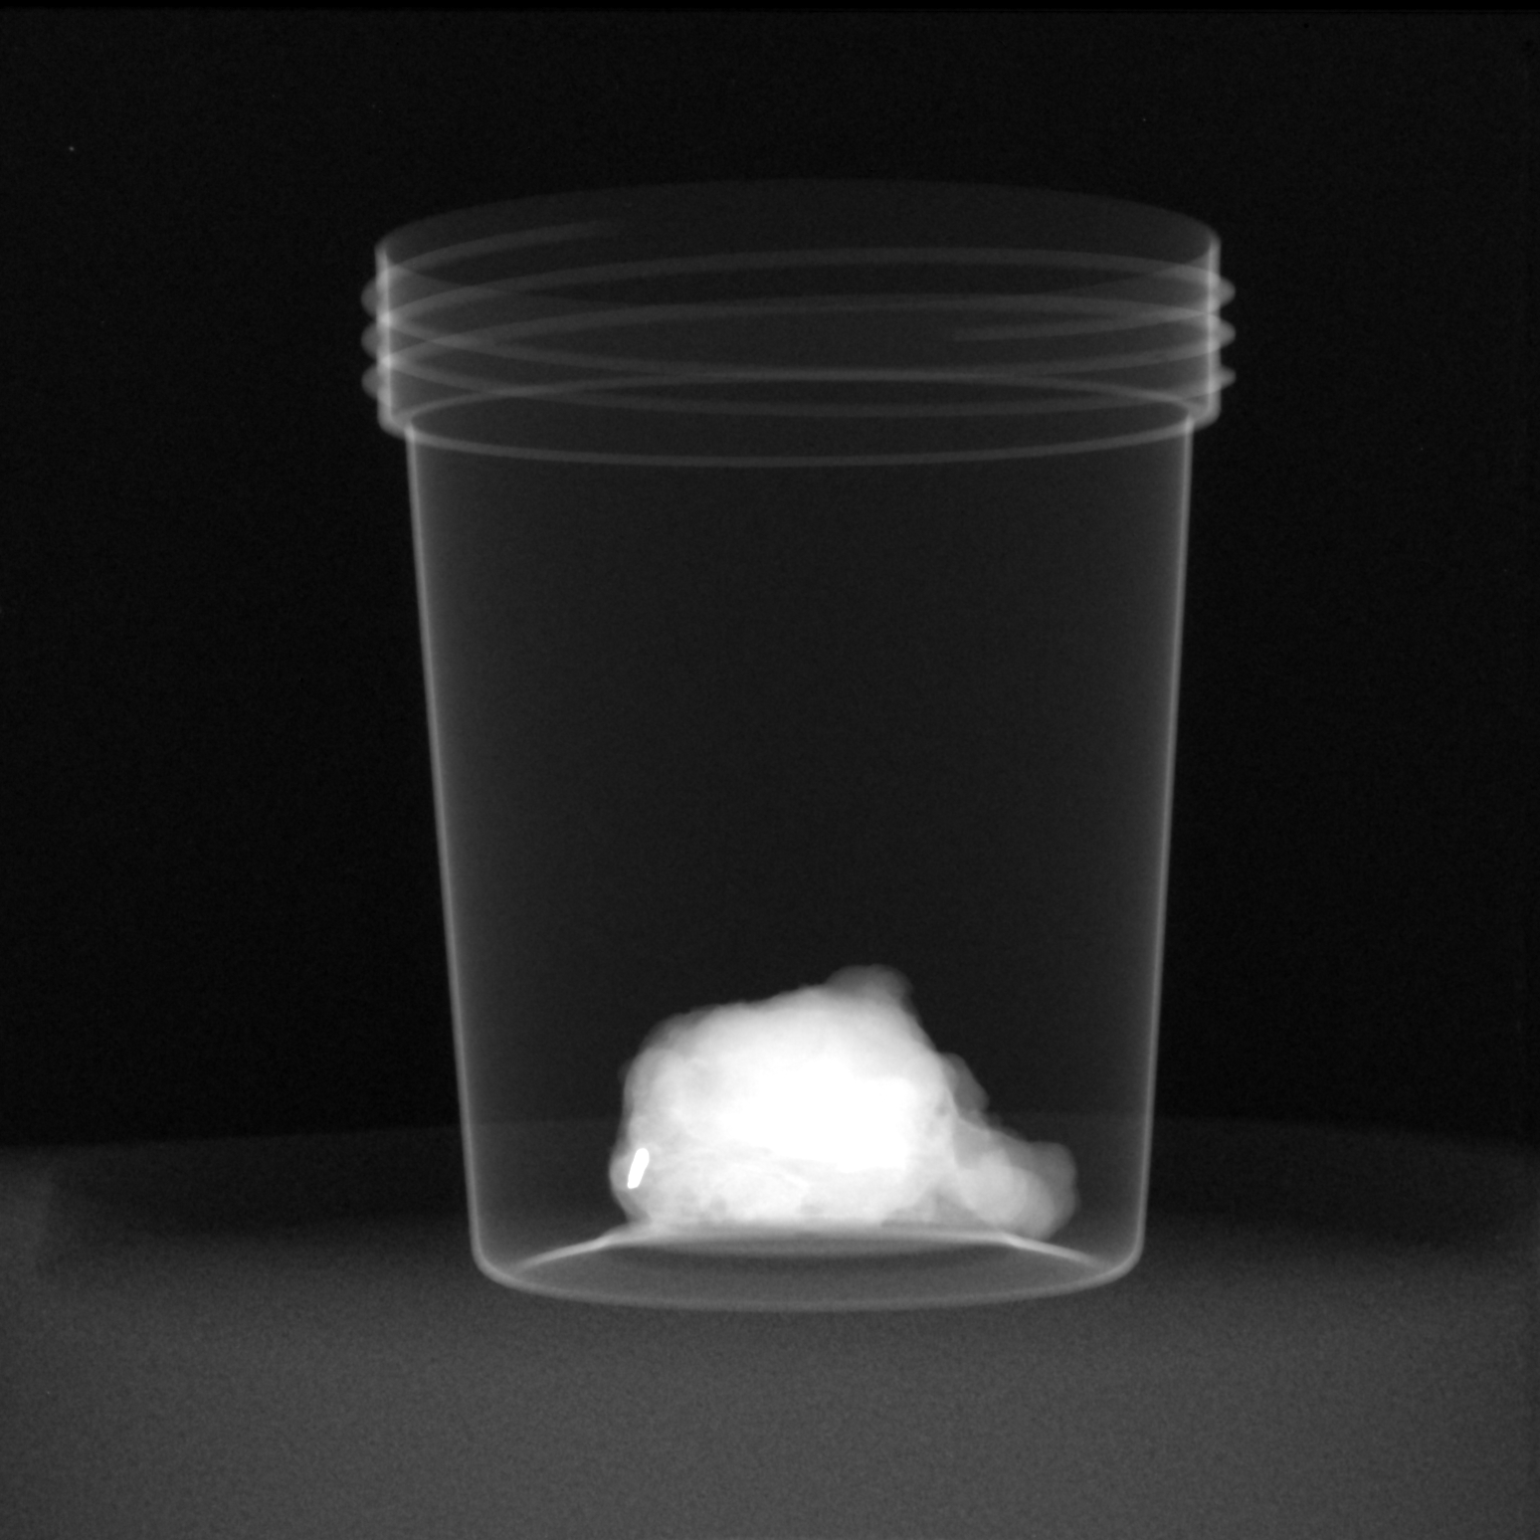

[2 of 2 positions shown; findings below may reference images not displayed]

FINDINGS: Status post excision of the left breast and left axilla. The
radioactive seed and biopsy marker clips are present, completely
intact, and were marked for pathology.
IMPRESSION: Specimen radiograph of the left breast.

## 2019-06-13 SURGERY — BREAST LUMPECTOMY WITH RADIOACTIVE SEED AND AXILLARY LYMPH NODE DISSECTION
Anesthesia: General | Site: Chest | Laterality: Right

## 2019-06-13 MED ORDER — SODIUM CHLORIDE (PF) 0.9 % IJ SOLN
INTRAMUSCULAR | Status: AC
  Filled 2019-06-13: qty 10

## 2019-06-13 MED ORDER — FENTANYL CITRATE (PF) 100 MCG/2ML IJ SOLN
INTRAMUSCULAR | Status: AC
  Filled 2019-06-13: qty 2

## 2019-06-13 MED ORDER — GABAPENTIN 300 MG PO CAPS
ORAL_CAPSULE | ORAL | Status: AC
  Filled 2019-06-13: qty 1

## 2019-06-13 MED ORDER — CEFAZOLIN SODIUM-DEXTROSE 2-4 GM/100ML-% IV SOLN
2.0000 g | INTRAVENOUS | Status: AC
  Administered 2019-06-13: 2 g via INTRAVENOUS

## 2019-06-13 MED ORDER — MIDAZOLAM HCL 2 MG/2ML IJ SOLN
INTRAMUSCULAR | Status: AC
  Filled 2019-06-13: qty 2

## 2019-06-13 MED ORDER — CHLORHEXIDINE GLUCONATE CLOTH 2 % EX PADS: 6.0000 | MEDICATED_PAD | Freq: Once | CUTANEOUS | Status: DC

## 2019-06-13 MED ORDER — ACETAMINOPHEN 500 MG PO TABS
ORAL_TABLET | ORAL | Status: AC
  Filled 2019-06-13: qty 2

## 2019-06-13 MED ORDER — ONDANSETRON HCL 4 MG/2ML IJ SOLN
INTRAMUSCULAR | Status: AC
  Filled 2019-06-13: qty 2

## 2019-06-13 MED ORDER — DEXAMETHASONE SODIUM PHOSPHATE 10 MG/ML IJ SOLN
INTRAMUSCULAR | Status: DC | PRN
  Administered 2019-06-13: 4 mg via INTRAVENOUS

## 2019-06-13 MED ORDER — PROPOFOL 10 MG/ML IV BOLUS
INTRAVENOUS | Status: DC | PRN
  Administered 2019-06-13: 150 mg via INTRAVENOUS
  Administered 2019-06-13: 30 mg via INTRAVENOUS

## 2019-06-13 MED ORDER — FENTANYL CITRATE (PF) 100 MCG/2ML IJ SOLN
100.0000 ug | INTRAMUSCULAR | Status: DC | PRN
  Administered 2019-06-13: 100 ug via INTRAVENOUS

## 2019-06-13 MED ORDER — FENTANYL CITRATE (PF) 100 MCG/2ML IJ SOLN: 25.0000 ug | INTRAMUSCULAR | Status: DC | PRN

## 2019-06-13 MED ORDER — BUPIVACAINE HCL (PF) 0.25 % IJ SOLN
INTRAMUSCULAR | Status: AC
  Filled 2019-06-13: qty 150

## 2019-06-13 MED ORDER — CEFAZOLIN SODIUM-DEXTROSE 2-4 GM/100ML-% IV SOLN
INTRAVENOUS | Status: AC
  Filled 2019-06-13: qty 100

## 2019-06-13 MED ORDER — BUPIVACAINE HCL (PF) 0.25 % IJ SOLN
INTRAMUSCULAR | Status: DC | PRN
  Administered 2019-06-13: 10 mL

## 2019-06-13 MED ORDER — MIDAZOLAM HCL 2 MG/2ML IJ SOLN
1.0000 mg | INTRAMUSCULAR | Status: DC | PRN
  Administered 2019-06-13: 2 mg via INTRAVENOUS

## 2019-06-13 MED ORDER — ACETAMINOPHEN 500 MG PO TABS
1000.0000 mg | ORAL_TABLET | ORAL | Status: AC
  Administered 2019-06-13: 1000 mg via ORAL

## 2019-06-13 MED ORDER — GABAPENTIN 300 MG PO CAPS
300.0000 mg | ORAL_CAPSULE | ORAL | Status: AC
  Administered 2019-06-13: 300 mg via ORAL

## 2019-06-13 MED ORDER — PROPOFOL 500 MG/50ML IV EMUL
INTRAVENOUS | Status: AC
  Filled 2019-06-13: qty 50

## 2019-06-13 MED ORDER — SCOPOLAMINE 1 MG/3DAYS TD PT72
MEDICATED_PATCH | TRANSDERMAL | Status: AC
  Filled 2019-06-13: qty 1

## 2019-06-13 MED ORDER — PHENYLEPHRINE HCL (PRESSORS) 10 MG/ML IV SOLN
INTRAVENOUS | Status: DC | PRN
  Administered 2019-06-13 (×2): 40 ug via INTRAVENOUS

## 2019-06-13 MED ORDER — PROMETHAZINE HCL 25 MG/ML IJ SOLN: 6.2500 mg | INTRAMUSCULAR | Status: DC | PRN

## 2019-06-13 MED ORDER — ONDANSETRON HCL 4 MG/2ML IJ SOLN
INTRAMUSCULAR | Status: DC | PRN
  Administered 2019-06-13: 4 mg via INTRAVENOUS

## 2019-06-13 MED ORDER — BUPIVACAINE-EPINEPHRINE (PF) 0.5% -1:200000 IJ SOLN
INTRAMUSCULAR | Status: DC | PRN
  Administered 2019-06-13: 30 mL via PERINEURAL

## 2019-06-13 MED ORDER — SCOPOLAMINE 1 MG/3DAYS TD PT72
1.0000 | MEDICATED_PATCH | Freq: Once | TRANSDERMAL | Status: DC
  Administered 2019-06-13: 1.5 mg via TRANSDERMAL

## 2019-06-13 MED ORDER — DEXAMETHASONE SODIUM PHOSPHATE 10 MG/ML IJ SOLN
INTRAMUSCULAR | Status: AC
  Filled 2019-06-13: qty 1

## 2019-06-13 MED ORDER — FENTANYL CITRATE (PF) 100 MCG/2ML IJ SOLN
INTRAMUSCULAR | Status: DC | PRN
  Administered 2019-06-13 (×4): 25 ug via INTRAVENOUS

## 2019-06-13 MED ORDER — METHYLENE BLUE 0.5 % INJ SOLN
INTRAVENOUS | Status: AC
  Filled 2019-06-13: qty 10

## 2019-06-13 MED ORDER — LACTATED RINGERS IV SOLN: INTRAVENOUS | Status: DC

## 2019-06-13 MED ORDER — OXYCODONE HCL 5 MG PO TABS: 5.0000 mg | ORAL_TABLET | Freq: Four times a day (QID) | ORAL | 0 refills | Status: DC | PRN

## 2019-06-13 MED ORDER — LIDOCAINE 2% (20 MG/ML) 5 ML SYRINGE
INTRAMUSCULAR | Status: DC | PRN
  Administered 2019-06-13: 80 mg via INTRAVENOUS

## 2019-06-13 SURGICAL SUPPLY — 50 items
APL PRP STRL LF DISP 70% ISPRP (MISCELLANEOUS) ×2
APL SKNCLS STERI-STRIP NONHPOA (GAUZE/BANDAGES/DRESSINGS) ×2
APPLIER CLIP 9.375 MED OPEN (MISCELLANEOUS) ×4
APR CLP MED 9.3 20 MLT OPN (MISCELLANEOUS) ×2
BENZOIN TINCTURE PRP APPL 2/3 (GAUZE/BANDAGES/DRESSINGS) ×4 IMPLANT
BLADE HEX COATED 2.75 (ELECTRODE) ×4 IMPLANT
BLADE SURG 15 STRL LF DISP TIS (BLADE) ×2 IMPLANT
BLADE SURG 15 STRL SS (BLADE) ×4
CANISTER SUCT 1200ML W/VALVE (MISCELLANEOUS) ×2 IMPLANT
CHLORAPREP W/TINT 26 (MISCELLANEOUS) ×4 IMPLANT
CLIP APPLIE 9.375 MED OPEN (MISCELLANEOUS) IMPLANT
CLOSURE WOUND 1/2 X4 (GAUZE/BANDAGES/DRESSINGS) ×1
COVER BACK TABLE 60X90IN (DRAPES) ×4 IMPLANT
COVER MAYO STAND STRL (DRAPES) ×4 IMPLANT
COVER PROBE W GEL 5X96 (DRAPES) ×4 IMPLANT
DRAPE LAPAROSCOPIC ABDOMINAL (DRAPES) ×4 IMPLANT
DRAPE UTILITY XL STRL (DRAPES) ×4 IMPLANT
DRSG TEGADERM 4X4.75 (GAUZE/BANDAGES/DRESSINGS) ×8 IMPLANT
ELECT REM PT RETURN 9FT ADLT (ELECTROSURGICAL) ×4
ELECTRODE REM PT RTRN 9FT ADLT (ELECTROSURGICAL) ×2 IMPLANT
GAUZE SPONGE 4X4 12PLY STRL LF (GAUZE/BANDAGES/DRESSINGS) ×4 IMPLANT
GLOVE BIO SURGEON STRL SZ 6.5 (GLOVE) ×1 IMPLANT
GLOVE BIO SURGEON STRL SZ7 (GLOVE) ×4 IMPLANT
GLOVE BIO SURGEONS STRL SZ 6.5 (GLOVE) ×1
GLOVE BIOGEL PI IND STRL 6.5 (GLOVE) ×1 IMPLANT
GLOVE BIOGEL PI IND STRL 7.5 (GLOVE) ×2 IMPLANT
GLOVE BIOGEL PI INDICATOR 6.5 (GLOVE) ×2
GLOVE BIOGEL PI INDICATOR 7.5 (GLOVE) ×2
GLOVE SURG SS PI 6.5 STRL IVOR (GLOVE) ×2 IMPLANT
GOWN STRL REUS W/ TWL LRG LVL3 (GOWN DISPOSABLE) ×5 IMPLANT
GOWN STRL REUS W/TWL LRG LVL3 (GOWN DISPOSABLE) ×12
KIT MARKER MARGIN INK (KITS) ×4 IMPLANT
NDL HYPO 25X1 1.5 SAFETY (NEEDLE) ×4 IMPLANT
NEEDLE HYPO 25X1 1.5 SAFETY (NEEDLE) ×4 IMPLANT
NS IRRIG 1000ML POUR BTL (IV SOLUTION) ×2 IMPLANT
PACK BASIN DAY SURGERY FS (CUSTOM PROCEDURE TRAY) ×4 IMPLANT
PENCIL SMOKE EVACUATOR (MISCELLANEOUS) ×4 IMPLANT
SLEEVE SCD COMPRESS KNEE MED (MISCELLANEOUS) ×4 IMPLANT
SPONGE LAP 4X18 RFD (DISPOSABLE) ×6 IMPLANT
STRIP CLOSURE SKIN 1/2X4 (GAUZE/BANDAGES/DRESSINGS) ×3 IMPLANT
SUT MON AB 4-0 PC3 18 (SUTURE) ×7 IMPLANT
SUT VIC AB 3-0 SH 27 (SUTURE) ×8
SUT VIC AB 3-0 SH 27X BRD (SUTURE) ×2 IMPLANT
SYR BULB 3OZ (MISCELLANEOUS) ×4 IMPLANT
SYR CONTROL 10ML LL (SYRINGE) ×4 IMPLANT
TOWEL GREEN STERILE FF (TOWEL DISPOSABLE) ×4 IMPLANT
TRAY FAXITRON CT DISP (TRAY / TRAY PROCEDURE) ×4 IMPLANT
TUBE CONNECTING 20'X1/4 (TUBING) ×1
TUBE CONNECTING 20X1/4 (TUBING) ×3 IMPLANT
YANKAUER SUCT BULB TIP NO VENT (SUCTIONS) ×3 IMPLANT

## 2019-06-13 NOTE — Interval H&P Note (Signed)
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  The various methods of treatment have been discussed with the patient and family. After consideration of risks, benefits and other options for treatment, the patient has consented to  Procedure(s) with comments: LEFT BREAST LUMPECTOMY WITH RADIOACTIVE SEED AND AXILLARY TARGETED LYMPH NODE DISSECTION (Left) - LMA, PEC BLOCK as a surgical intervention.  The patient's history has been reviewed, patient examined, no change in status, stable for surgery.  I have reviewed the patient's chart and labs.  Questions were answered to the patient's satisfaction.     Maia Petties

## 2019-06-13 NOTE — Progress Notes (Signed)
Assisted Dr. Turk with left, ultrasound guided, pectoralis block. Side rails up, monitors on throughout procedure. See vital signs in flow sheet. Tolerated Procedure well. 

## 2019-06-13 NOTE — Anesthesia Postprocedure Evaluation (Signed)
Anesthesia Post Note  Patient: Paige Perry  Procedure(s) Performed: LEFT BREAST LUMPECTOMY WITH RADIOACTIVE SEED AND AXILLARY TARGETED LYMPH NODE DISSECTION (Left Breast) RIGHT CHEST WOUND EXPLORATION (Right Chest)     Patient location during evaluation: PACU Anesthesia Type: General Level of consciousness: awake and alert Pain management: pain level controlled Vital Signs Assessment: post-procedure vital signs reviewed and stable Respiratory status: spontaneous breathing, nonlabored ventilation and respiratory function stable Cardiovascular status: blood pressure returned to baseline and stable Postop Assessment: no apparent nausea or vomiting Anesthetic complications: no    Last Vitals:  Vitals:   06/13/19 1015 06/13/19 1036  BP: (!) 117/94 122/86  Pulse: 97 (!) 104  Resp: 15 16  Temp:  36.7 C  SpO2: 100% 99%    Last Pain:  Vitals:   06/13/19 1036  TempSrc:   PainSc: 3                  Catalina Gravel

## 2019-06-13 NOTE — Op Note (Signed)
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 This is been present for about a month. She underwent diagnostic mammogram and ultrasound on 01/14/19. Showed a 2.3 cm mass in the left breast at 12:00. She also had a single enlarged left axillary lymph node measuring 3.5 cm. She underwent biopsy of the mass as well as the axillary lymph node. 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   On 02/05/19 she underwent ultrasound-guided port placement. She began neoadjuvant chemotherapy. She had a follow-up MRI on 05/29/19. This showed complete imaging resolution of the malignancy in the upper outer left breast. The 2 enlarged left axillary lymph nodes are markedly improved. She presents now for lumpectomy at the site of the original tumor as well as targeted axillary lymph node dissection.  Radioactive seeds were placed yesterday and the previously biopsied axillary lymph node as well as at the site of the original tumor.  Description of procedure: The patient is brought to the operating room placed in the supine position on the operating room table.  After an adequate level of general anesthesia was obtained her entire chest and left axilla were prepped with ChloraPrep and draped in sterile fashion.  A timeout was taken to ensure the proper patient and proper procedure.  I interrogated the left breast and the left axilla with the neoprobe.  The radioactive seed is located in the upper outer quadrant and the radioactive seed in the axilla  is in the anterior axilla.  I felt that these were relatively close together so I planned an incision between the 2 sites.  We infiltrated this area with 0.25% Marcaine.  I opened the incision.  We dissected medially towards the breast lumpectomy site.  We used the neoprobe to guide [place] towards the radioactive seed. We excised an area of tissue around the radioactive seed 2 cm in diameter. The specimen was removed and was oriented with a paint kit. Specimen mammogram showed the radioactive seed as well as the biopsy clip within the specimen. This was sent for pathologic examination. There is no residual radioactivity within the biopsy cavity. We inspected carefully for hemostasis.   We then turned our attention to the more lateral aspect of the incision.  We dissected into the axilla with cautery.  The neoprobe was used to guide [place] toward the area with the enlarged lymph nodes.  We encountered at least 4 5 clustered lymph nodes.  These were excised.  Specimen mammogram revealed the seed and the biopsy clip within one of the lymph nodes.  There is an additional enlarged lymph node that was taken separately.  No other palpable nodes remain.  These were all sent for pathologic examination as left axillary contents.  The wound was thoroughly irrigated. The wound was closed with a deep layer of 3-0 Vicryl and a subcuticular layer of 4-0 Monocryl.   The patient also had some irritation at the incision of her right subclavian port site.  This area was anesthetized with local anesthetic and I made a 5 mm incision over this area.  The Prolene suture was sticking up  and irritating the back of her dermis.  I remove the Prolene suture.  The incision was closed with 4-0 Monocryl.  Benzoin Steri-Strips were applied to both incisions. The patient was then extubated and brought to the recovery room in stable condition. All sponge, instrument, and needle counts are correct.  Imogene Burn. Georgette Dover, MD, [facility]   General/ Trauma Surgery  06/13/2019 9:23 AM

## 2019-06-13 NOTE — Transfer of Care (Signed)
Immediate Anesthesia Transfer of Care Note  Patient: Paige Perry  Procedure(s) Performed: LEFT BREAST LUMPECTOMY WITH RADIOACTIVE SEED AND AXILLARY TARGETED LYMPH NODE DISSECTION (Left Breast) RIGHT CHEST WOUND EXPLORATION (Right Chest)  Patient Location: PACU  Anesthesia Type:GA combined with regional for post-op pain  Level of Consciousness: sedated  Airway & Oxygen Therapy: Patient Spontanous Breathing and Patient connected to nasal cannula oxygen  Post-op Assessment: Report given to RN and Post -op Vital signs reviewed and stable  Post vital signs: Reviewed and stable  Last Vitals:  Vitals Value Taken Time  BP 100/68 06/13/19 0925  Temp    Pulse 112 06/13/19 0926  Resp 9 06/13/19 0926  SpO2 100 % 06/13/19 0926  Vitals shown include unvalidated device data.  Last Pain:  Vitals:   06/13/19 0635  TempSrc: Oral  PainSc: 0-No pain      Patients Stated Pain Goal: 2 ([id number])  Complications: No apparent anesthesia complications

## 2019-06-13 NOTE — Discharge Instructions (Signed)
[place] Office Phone Number [phone]  BREAST BIOPSY/ PARTIAL MASTECTOMY: POST OP INSTRUCTIONS  Always review your discharge instruction sheet given to you by the facility where your surgery was performed.  IF YOU HAVE DISABILITY OR FAMILY LEAVE FORMS, YOU MUST BRING THEM TO THE OFFICE FOR PROCESSING.  DO NOT GIVE THEM TO YOUR DOCTOR.  1. A prescription for pain medication may be given to you upon discharge.  Take your pain medication as prescribed, if needed.  If narcotic pain medicine is not needed, then you may take acetaminophen (Tylenol) or ibuprofen (Advil) as needed.  No Tylenol until 1145   2. Take your usually prescribed medications unless otherwise directed 3. If you need a refill on your pain medication, please contact your pharmacy.  They will contact our office to request authorization.  Prescriptions will not be filled after 5pm or on week-ends. 4. You should eat very light the first 24 hours after surgery, such as soup, crackers, pudding, etc.  Resume your normal diet the day after surgery. 5. Most patients will experience some swelling and bruising in the breast.  Ice packs and a good support bra will help.  Swelling and bruising can take several days to resolve.  6. It is common to experience some constipation if taking pain medication after surgery.  Increasing fluid intake and taking a stool softener will usually help or prevent this problem from occurring.  A mild laxative (Milk of Magnesia or Miralax) should be taken according to package directions if there are no bowel movements after 48 hours. 7. Unless discharge instructions indicate otherwise, you may remove your bandages 24-48 hours after surgery, and you may shower at that time.  You may have steri-strips (small skin tapes) in place directly over the incision.  These strips should be left on the skin for 7-10 days.  If your surgeon used skin glue on the incision, you may shower in 24 hours.  The  glue will flake off over the next 2-3 weeks.  Any sutures or staples will be removed at the office during your follow-up visit. 8. ACTIVITIES:  You may resume regular daily activities (gradually increasing) beginning the next day.  Wearing a good support bra or sports bra minimizes pain and swelling.  You may have sexual intercourse when it is comfortable. a. You may drive when you no longer are taking prescription pain medication, you can comfortably wear a seatbelt, and you can safely maneuver your car and apply brakes. b. RETURN TO WORK:  ______________________________________________________________________________________ 9. You should see your doctor in the office for a follow-up appointment approximately two weeks after your surgery.  Your doctor's nurse will typically make your follow-up appointment when she calls you with your pathology report.  Expect your pathology report 2-3 business days after your surgery.  You may call to check if you do not hear from [place] after three days. 10. OTHER INSTRUCTIONS: _______________________________________________________________________________________________ _____________________________________________________________________________________________________________________________________ _____________________________________________________________________________________________________________________________________ _____________________________________________________________________________________________________________________________________  WHEN TO CALL YOUR DOCTOR: 1. Fever over 101.0 2. Nausea and/or vomiting. 3. Extreme swelling or bruising. 4. Continued bleeding from incision. 5. Increased pain, redness, or drainage from the incision.  The clinic staff is available to answer your questions during regular business hours.  Please don't hesitate to call and ask to speak to one of the nurses for clinical concerns.  If you have a medical  emergency, go to the nearest emergency room or call 911.  A surgeon from [facility] is always on call at the hospital.  For further questions, please visit [email or web address]     Post Anesthesia Home Care Instructions  Activity: Get plenty of rest for the remainder of the day. A responsible individual must stay with you for 24 hours following the procedure.  For the next 24 hours, DO NOT: -Drive a car -Paediatric nurse -Drink alcoholic beverages -Take any medication unless instructed by your physician -Make any legal decisions or sign important papers.  Meals: Start with liquid foods such as gelatin or soup. Progress to regular foods as tolerated. Avoid greasy, spicy, heavy foods. If nausea and/or vomiting occur, drink only clear liquids until the nausea and/or vomiting subsides. Call your physician if vomiting continues.  Special Instructions/Symptoms: Your throat may feel dry or sore from the anesthesia or the breathing tube placed in your throat during surgery. If this causes discomfort, gargle with warm salt water. The discomfort should disappear within 24 hours.  If you had a scopolamine patch placed behind your ear for the management of post- operative nausea and/or vomiting:  1. The medication in the patch is effective for 72 hours, after which it should be removed.  Wrap patch in a tissue and discard in the trash. Wash hands thoroughly with soap and water. 2. You may remove the patch earlier than 72 hours if you experience unpleasant side effects which may include dry mouth, dizziness or visual disturbances. 3. Avoid touching the patch. Wash your hands with soap and water after contact with the patch.

## 2019-06-13 NOTE — Anesthesia Postprocedure Evaluation (Signed)
Anesthesia Post Note  Patient: KEMYA PHILBERT  Procedure(s) Performed: LEFT BREAST LUMPECTOMY WITH RADIOACTIVE SEED AND AXILLARY TARGETED LYMPH NODE DISSECTION (Left Breast) RIGHT CHEST WOUND EXPLORATION (Right Chest)     Patient location during evaluation: PACU Anesthesia Type: General Level of consciousness: awake and alert Pain management: pain level controlled Vital Signs Assessment: post-procedure vital signs reviewed and stable Respiratory status: spontaneous breathing, nonlabored ventilation and respiratory function stable Cardiovascular status: blood pressure returned to baseline and stable Postop Assessment: no apparent nausea or vomiting Anesthetic complications: no    Last Vitals:  Vitals:   06/13/19 1015 06/13/19 1036  BP: (!) 117/94 122/86  Pulse: 97 (!) 104  Resp: 15 16  Temp:  36.7 C  SpO2: 100% 99%    Last Pain:  Vitals:   06/13/19 1036  TempSrc:   PainSc: 3                  Catalina Gravel

## 2019-06-13 NOTE — Anesthesia Procedure Notes (Signed)
Anesthesia Regional Block: Pectoralis block   Pre-Anesthetic Checklist: ,, timeout performed, Correct Patient, Correct Site, Correct Laterality, Correct Procedure, Correct Position, site marked, Risks and benefits discussed,  Surgical consent,  Pre-op evaluation,  At surgeon's request and post-op pain management  Laterality: Left  Prep: chloraprep       Needles:  Injection technique: Single-shot  Needle Type: Echogenic Needle     Needle Length: 9cm  Needle Gauge: 21     Additional Needles:   Procedures:,,,, ultrasound used (permanent image in chart),,,,  Narrative:  Start time: 06/13/2019 7:00 AM End time: 06/13/2019 7:15 AM Injection made incrementally with aspirations every 5 mL.  Performed by: Personally  Anesthesiologist: Catalina Gravel, MD  Additional Notes: No pain on injection. No increased resistance to injection. Injection made in 5cc increments.  Good needle visualization.  Patient tolerated procedure well.

## 2019-06-13 NOTE — Anesthesia Procedure Notes (Signed)
Procedure Name: LMA Insertion Date/Time: 06/13/2019 8:05 AM Performed by: Maryella Shivers, CRNA Pre-anesthesia Checklist: Patient identified, Emergency Drugs available, Suction available and Patient being monitored Patient Re-evaluated:Patient Re-evaluated prior to induction Oxygen Delivery Method: Circle system utilized Preoxygenation: Pre-oxygenation with 100% oxygen Induction Type: IV induction Ventilation: Mask ventilation without difficulty LMA: LMA inserted LMA Size: 4.0 Number of attempts: 1 Airway Equipment and Method: Bite block Placement Confirmation: positive ETCO2 Tube secured with: Tape Dental Injury: Teeth and Oropharynx as per pre-operative assessment

## 2019-06-13 NOTE — Anesthesia Preprocedure Evaluation (Signed)
Anesthesia Evaluation  Patient identified by MRN, date of birth, ID band Patient awake    Reviewed: Allergy & Precautions, NPO status , Patient's Chart, lab work & pertinent test results  History of Anesthesia Complications (+) PONV and history of anesthetic complications  Airway Mallampati: II  TM Distance: >3 FB Neck ROM: Full    Dental  (+) Teeth Intact, Dental Advisory Given   Pulmonary neg pulmonary ROS,    Pulmonary exam normal breath sounds clear to auscultation       Cardiovascular negative cardio ROS Normal cardiovascular exam Rhythm:Regular Rate:Normal     Neuro/Psych negative neurological ROS     GI/Hepatic Neg liver ROS, GERD  Medicated,  Endo/Other  Obesity   Renal/GU negative Renal ROS     Musculoskeletal negative musculoskeletal ROS (+)   Abdominal   Peds  Hematology negative hematology ROS (+)   Anesthesia Other Findings Day of surgery medications reviewed with the patient.  Breast cancer  Reproductive/Obstetrics                             Anesthesia Physical Anesthesia Plan  ASA: II  Anesthesia Plan: General   Post-op Pain Management:  Regional for Post-op pain   Induction: Intravenous  PONV Risk Score and Plan: 4 or greater and Midazolam, Scopolamine patch - Pre-op, Dexamethasone and Ondansetron  Airway Management Planned: LMA  Additional Equipment:   Intra-op Plan:   Post-operative Plan: Extubation in OR  Informed Consent: I have reviewed the patients History and Physical, chart, labs and discussed the procedure including the risks, benefits and alternatives for the proposed anesthesia with the patient or authorized representative who has indicated his/her understanding and acceptance.     Dental advisory given  Plan Discussed with: CRNA  Anesthesia Plan Comments:         Anesthesia Quick Evaluation

## 2019-06-14 ENCOUNTER — Encounter: Payer: Self-pay | Admitting: *Deleted

## 2019-06-18 ENCOUNTER — Encounter: Payer: Self-pay | Admitting: *Deleted

## 2019-06-19 NOTE — Progress Notes (Signed)
Patient Care Team: [name], NP as PCP - General Mauro Kaufmann, RN as Oncology Nurse Navigator Rockwell Germany, RN as Oncology Nurse Navigator Donnie Mesa, MD as Consulting Physician (General Surgery) Nicholas Lose, MD as Consulting Physician (Hematology and Oncology) Kyung Rudd, MD as Consulting Physician (Radiation Oncology)  DIAGNOSIS:    ICD-10-CM   1. Malignant neoplasm of upper-outer quadrant of left breast in female, estrogen receptor positive ([place])  C50.412    Z17.0     SUMMARY OF ONCOLOGIC HISTORY: Oncology History  Malignant neoplasm of upper-outer quadrant of left breast in female, estrogen receptor positive ([place])  01/18/2019 Initial Diagnosis   Patient palpated a left breast lump for 2 weeks. Mammogram showed a 2.3cm left breast mass at the 12 o'clock position, with 1 enlarged left axillary lymph node measuring 3.5cm. 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   01/23/2019 Cancer Staging   Staging form: Breast, AJCC 8th Edition - Clinical stage from 01/23/2019: Stage IIA (cT2, cN1(f), cM0, G2, ER+, PR-, HER2+) - Signed by Nicholas Lose, MD on 01/23/2019   02/13/2019 -  Chemotherapy   The patient had palonosetron (ALOXI) injection 0.25 mg, 0.25 mg, Intravenous,  Once, 6 of 6 cycles Administration: 0.25 mg (02/13/2019), 0.25 mg (03/04/2019), 0.25 mg (05/07/2019), 0.25 mg (05/28/2019), 0.25 mg (03/26/2019), 0.25 mg (04/18/2019) pegfilgrastim-jmdb (FULPHILA) injection 6 mg, 6 mg, Subcutaneous,  Once, 6 of 6 cycles Administration: 6 mg (02/15/2019), 6 mg (03/06/2019), 6 mg (05/09/2019), 6 mg (05/30/2019), 6 mg (03/28/2019), 6 mg (04/20/2019) CARBOplatin (PARAPLATIN) 700 mg in sodium chloride 0.9 % 250 mL chemo infusion, 700 mg (100 % of original dose 700 mg), Intravenous,  Once, 6 of 6 cycles Dose modification: 700 mg (original dose 700 mg, Cycle 1) Administration: 700 mg (02/13/2019), 700 mg (03/04/2019), 700 mg (05/07/2019), 700 mg  (05/28/2019), 700 mg (03/26/2019), 700 mg (04/18/2019) DOCEtaxel (TAXOTERE) 140 mg in sodium chloride 0.9 % 250 mL chemo infusion, 75 mg/m2 = 140 mg, Intravenous,  Once, 6 of 6 cycles Dose modification: 65 mg/m2 (original dose 75 mg/m2, Cycle 5, Reason: Dose not tolerated) Administration: 140 mg (02/13/2019), 140 mg (03/04/2019), 120 mg (05/07/2019), 120 mg (05/28/2019), 140 mg (03/26/2019), 140 mg (04/18/2019) pertuzumab (PERJETA) 420 mg in sodium chloride 0.9 % 250 mL chemo infusion, 420 mg (100 % of original dose 420 mg), Intravenous, Once, 3 of 3 cycles Dose modification: 420 mg (original dose 420 mg, Cycle 1, Reason: Provider Judgment) Administration: 420 mg (02/13/2019), 420 mg (03/04/2019), 420 mg (03/26/2019) fosaprepitant (EMEND) 150 mg, dexamethasone (DECADRON) 12 mg in sodium chloride 0.9 % 145 mL IVPB, , Intravenous,  Once, 6 of 6 cycles Administration:  (02/13/2019),  (03/04/2019),  (05/07/2019),  (05/28/2019),  (03/26/2019),  (04/18/2019) trastuzumab-dkst (OGIVRI) 651 mg in sodium chloride 0.9 % 250 mL chemo infusion, 8 mg/kg = 651 mg, Intravenous,  Once, 6 of 6 cycles Administration: 651 mg (02/13/2019), 483 mg (03/04/2019), 483 mg (05/07/2019), 483 mg (05/28/2019), 483 mg (03/26/2019), 483 mg (04/18/2019)  for chemotherapy treatment.     Genetic Testing   Negative genetic testing. No pathogenic variants identified on the [facility] Common Hereditary Cancers Panel. The Common Hereditary Cancers Panel offered by [facility] includes sequencing and/or deletion duplication testing of the following 48 genes: APC, ATM, AXIN2, BARD1, BMPR1A, BRCA1, BRCA2, BRIP1, CDH1, CDKN2A (p14ARF), CDKN2A (p16INK4a), CKD4, CHEK2, CTNNA1, DICER1, EPCAM (Deletion/duplication testing only), GREM1 (promoter region deletion/duplication testing only), KIT, MEN1, MLH1, MSH2, MSH3, MSH6, MUTYH, NBN, NF1, NHTL1, PALB2, PDGFRA, PMS2, POLD1,  POLE, PTEN, RAD50, RAD51C, RAD51D, RNF43, SDHB, SDHC, SDHD, SMAD4, SMARCA4. STK11, TP53, TSC1, TSC2, and  VHL.  The following genes were evaluated for sequence changes only: SDHA and HOXB13 c.251G>A variant only. The report date is 02/01/2019.    06/13/2019 Surgery   Left lumpectomy (Tsuei): no residual carcinoma, 2/3 left axillary lymph nodes positive for carcinoma.   06/20/2019 Cancer Staging   Staging form: Breast, AJCC 8th Edition - Pathologic stage from 06/20/2019: No Stage Recommended (ypT0, pN1, cM0, ER+, PR-, HER2-) - Signed by Nicholas Lose, MD on 06/20/2019     CHIEF COMPLIANT: Follow-up s/p lumpectomy to review pathology  INTERVAL HISTORY: Paige Perry is a 44 y.o. with above-mentioned history of left breast cancer who completed neoadjuvant chemotherapy. She underwent a left lumpectomy on 06/13/19 with Dr. Georgette Dover for which pathology showed no residual carcinoma, 2/3 left axillary lymph nodes positive for carcinoma. She presents to the clinic today to discuss the pathology report and further treatment.   ALLERGIES:  is allergic to other.  MEDICATIONS:  Current Outpatient Medications  Medication Sig Dispense Refill  . cetirizine (ZYRTEC) 10 MG tablet Take 10 mg by mouth as needed.     Marland Kitchen ibuprofen (ADVIL,MOTRIN) 400 MG tablet Take 1 tablet (400 mg total) by mouth every 6 (six) hours as needed for pain. (Patient not taking: Reported on 03/04/2019) 30 tablet 0  . lidocaine-prilocaine (EMLA) cream Apply to affected area once 30 g 3  . LORazepam (ATIVAN) 0.5 MG tablet Take 1 tablet (0.5 mg total) by mouth at bedtime as needed (Nausea or vomiting). 30 tablet 0  . nabumetone (RELAFEN) 500 MG tablet Take 1,000 mg by mouth 2 (two) times daily.    Marland Kitchen oxyCODONE (OXY IR/ROXICODONE) 5 MG immediate release tablet Take 1 tablet (5 mg total) by mouth every 6 (six) hours as needed for severe pain. 15 tablet 0  . pantoprazole (PROTONIX) 40 MG tablet TAKE 1 TABLET BY MOUTH EVERY DAY 90 tablet 1  . rosuvastatin (CRESTOR) 20 MG tablet Take 20 mg by mouth daily.     No current facility-administered medications  for this visit.    PHYSICAL EXAMINATION: ECOG PERFORMANCE STATUS: 1 - Symptomatic but completely ambulatory  Vitals:   06/20/19 1028  BP: 113/67  Pulse: (!) 105  Resp: 20  Temp: 98.5 F (36.9 C)  SpO2: 100%   Filed Weights   06/20/19 1028  Weight: 194 lb 11.2 oz (88.3 kg)    LABORATORY DATA:  I have reviewed the data as listed CMP Latest Ref Rng & Units 05/28/2019 05/07/2019 04/18/2019  Glucose 70 - 99 mg/dL 107(H) 138(H) 114(H)  BUN 6 - 20 mg/dL 13 14 13   Creatinine 0.44 - 1.00 mg/dL 0.81 0.81 0.78  Sodium 135 - 145 mmol/L 142 139 139  Potassium 3.5 - 5.1 mmol/L 3.5 3.9 3.6  Chloride 98 - 111 mmol/L 108 108 109  CO2 22 - 32 mmol/L 25 21(L) 22  Calcium 8.9 - 10.3 mg/dL 9.1 9.2 8.9  Total Protein 6.5 - 8.1 g/dL 6.7 7.6 7.0  Total Bilirubin 0.3 - 1.2 mg/dL 0.4 0.3 0.3  Alkaline Phos 38 - 126 U/L 86 92 78  AST 15 - 41 U/L 22 16 15   ALT 0 - 44 U/L 34 22 17    Lab Results  Component Value Date   WBC 8.1 05/28/2019   HGB 9.9 (L) 05/28/2019   HCT 29.7 (L) 05/28/2019   MCV 100.0 05/28/2019   PLT 186 05/28/2019   NEUTROABS 4.9  05/28/2019    ASSESSMENT & PLAN:  Malignant neoplasm of upper-outer quadrant of left breast in female, estrogen receptor positive ([place]) 01/18/2019:Patient palpated a left breast lump for 2 weeks. Mammogram showed a 2.3cm left breast mass at the 12 o'clock position, with 1 enlarged left axillary lymph node measuring 3.5cm. 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 T2N1 stage IIa clinical stage  Treatment plan: 1. Neoadjuvant chemotherapy with TCH Perjeta 6 cycles followed by Herceptin maintenance for 1 year 2. Followed by breast conserving surgery if possible with sentinel lymph node study 3. Followed by adjuvant radiation therapy if patient had lumpectomy No adverse effects from participation in the neuropathy  clinical trial SWOG 1714 --------------------------------------------------------------------------------------------------------------------------------------- 06/13/2019 left lumpectomy (Tsuei): no residual carcinoma, 2/3 left axillary lymph nodes positive for carcinoma. Pathology counseling: I discussed the final pathology report of the patient provided  a copy of this report. I discussed the margins as well as lymph node surgeries. We also discussed the final staging along with previously performed ER/PR and HER-2/neu testing.  Recommendation: 1.  Completion axillary lymph node dissection 2.  Adjuvant radiation 3.  Adjuvant Herceptin if the HER-2 on the lymph nodes is positive.  We will discuss her case in tumor board and confirm the plan. If the HER-2 is negative then she can have the port removed at the time of her axillary lymph node dissection. We will call her with the result of her discussion in the tumor board. Patient is unhappy with my recommendation for axillary lymph node dissection.  However I explained to her that ALND is the standard of care for anyone with positive lymph nodes after neoadjuvant chemo.  No orders of the defined types were placed in this encounter.  The patient has a good understanding of the overall plan. she agrees with it. she will call with any problems that may develop before the next visit here.  Total time spent: 30 mins including face to face time and time spent for planning, charting and coordination of care  Nicholas Lose, MD 06/20/2019  I, Cloyde Reams Dorshimer, am acting as scribe for Dr. Nicholas Lose.  I have reviewed the above documentation for accuracy and completeness, and I agree with the above.

## 2019-06-20 ENCOUNTER — Other Ambulatory Visit: Payer: Self-pay

## 2019-06-20 ENCOUNTER — Inpatient Hospital Stay (HOSPITAL_BASED_OUTPATIENT_CLINIC_OR_DEPARTMENT_OTHER): Payer: BC Managed Care – PPO | Admitting: Hematology and Oncology

## 2019-06-20 DIAGNOSIS — C50412 Malignant neoplasm of upper-outer quadrant of left female breast: Secondary | ICD-10-CM

## 2019-06-20 DIAGNOSIS — Z170 Estrogen receptor positive status [ER+]: Secondary | ICD-10-CM

## 2019-06-20 NOTE — Assessment & Plan Note (Signed)
01/18/2019:Patient palpated a left breast lump for 2 weeks. Mammogram showed a 2.3cm left breast mass at the 12 o'clock position, with 1 enlarged left axillary lymph node measuring 3.5cm. 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 T2N1 stage IIa clinical stage  Treatment plan: 1. Neoadjuvant chemotherapy with TCH Perjeta 6 cycles followed by Herceptin maintenance for 1 year 2. Followed by breast conserving surgery if possible with sentinel lymph node study 3. Followed by adjuvant radiation therapy if patient had lumpectomy No adverse effects from participation in the neuropathy clinical trial SWOG 1714 --------------------------------------------------------------------------------------------------------------------------------------- 06/13/2019 left lumpectomy (Tsuei): no residual carcinoma, 2/3 left axillary lymph nodes positive for carcinoma. Pathology counseling: I discussed the final pathology report of the patient provided  a copy of this report. I discussed the margins as well as lymph node surgeries. We also discussed the final staging along with previously performed ER/PR and HER-2/neu testing.  Recommendation: 1.  Completion axillary lymph node dissection 2.  Adjuvant radiation 3.  Adjuvant Herceptin  We will discuss her case in tumor board and confirm the plan.

## 2019-06-21 LAB — SURGICAL PATHOLOGY

## 2019-06-25 ENCOUNTER — Encounter: Payer: Self-pay | Admitting: *Deleted

## 2019-06-26 ENCOUNTER — Encounter: Payer: Self-pay | Admitting: *Deleted

## 2019-07-01 ENCOUNTER — Telehealth: Payer: Self-pay | Admitting: Hematology and Oncology

## 2019-07-01 NOTE — Telephone Encounter (Signed)
I informed the patient that our recommendation is for her to undergo axillary lymph node dissection because after neoadjuvant chemo 2 out of 3 lymph nodes were positive. We will request Dr. Georgette Dover to remove her port at the time of surgery. I sent a message to Dr. Georgette Dover to get her an appointment to see him. Thank you

## 2019-07-01 NOTE — Telephone Encounter (Signed)
I left a voicemail for the patient of the final HER-2 is negative. Therefore there is no need for Herceptin adjuvant therapy. Port can be removed.

## 2019-07-02 ENCOUNTER — Ambulatory Visit: Payer: Self-pay | Admitting: Surgery

## 2019-07-02 NOTE — H&P (Signed)
History of Present Illness (Paige Perry K. Paige Dolin MD; 07/02/2019 4:46 PM) The patient is a 44 year old female who presents with breast cancer. BREAST [facility]  Oncology - [facility] - Cousins  This is a 44 year old female in good health who presented in October 2020 with a recent discovery of a palpable mass in her left breast. This is been present for about a month. She underwent diagnostic mammogram and ultrasound on 01/14/19. Showed a 2.3 cm mass in the left breast at 12:00. She also had a single enlarged left axillary lymph node measuring 3.5 cm. She underwent biopsy of the mass as well as the axillary lymph node. 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   On 02/05/19 she underwent ultrasound-guided port placement. She began neoadjuvant chemotherapy. She had a follow-up MRI on 05/29/19. This showed complete imaging resolution of the malignancy in the upper outer left breast. The 2 enlarged left axillary lymph nodes are markedly improved.   On 06/13/19, she underwent left radioactive seed low-cholesterol lumpectomy and a left axillary targeted lymph node dissection. We performed through the same incision. She had no residual cancer at her original tumor site. Unfortunately, 2 out of 3 axillary lymph nodes were positive for cancer. After discussion with Dr. Gudena, she presents now to discuss completion lymph node dissection. She also no longer needs her port.   Problem List/Past Medical ([name], MD; 07/02/2019 4:46 PM) INVASIVE DUCTAL CARCINOMA OF LEFT BREAST, STAGE 2 (C50.912)  Diagnostic Studies History (Paige Perry K. Lachele Lievanos, MD; 07/02/2019 4:46 PM) Colonoscopy never Mammogram within last year Pap Smear 1-5 years ago  Allergies ([name], CMA; 07/02/2019 4:06 PM) Corn Oil *CHEMICALS* Allergies Reconciled  Medication History ([name], CMA; 07/02/2019 4:06 PM) Rosuvastatin Calcium  (20MG Tablet, Oral) Active. Pantoprazole Sodium (40MG Tablet DR, Oral) Active. Medications Reconciled  Social History (Paige Perry K. Jailynn Lavalais, MD; 07/02/2019 4:46 PM) Alcohol use Recently quit alcohol use. Caffeine use Carbonated beverages. Illicit drug use Remotely quit drug use. Tobacco use Never smoker.  Family History ([name], MD; 07/02/2019 4:46 PM) Arthritis Mother. Breast Cancer Family Members In General. Heart Disease Family Members In General. Migraine Headache Sister.  Pregnancy / Birth History (Paige Perry K. Selvin Yun, MD; 07/02/2019 4:46 PM) Age at menarche 11 years. Contraceptive History Oral contraceptives. Gravida 2 Irregular periods Maternal age 15-20 Para 2  Other Problems ([name], MD; 07/02/2019 4:46 PM) Arthritis Hypercholesterolemia    Vitals ([name] CMA; 07/02/2019 4:07 PM) 07/02/2019 4:06 PM Weight: 197.2 lb Height: 63in Body Surface Area: 1.92 m Body Mass Index: 34.93 kg/m  Temp.: 97.7F(Tympanic)  Pulse: 134 (Regular)  BP: 110/62 (Sitting, Left Arm, Standard)        Physical Exam (Mada Sadik K. Glory Graefe MD; 07/02/2019 4:48 PM)  The physical exam findings are as follows: Note:Constitutional: WDWN in NAD, conversant, no obvious deformities; Eyes: Pupils equal, round; sclera anicteric; moist conjunctiva; no lid lag HENT: Oral mucosa moist; good dentition Neck: No masses palpated, trachea midline; no thyromegaly Lungs: CTA bilaterally; normal respiratory effort Breasts: Symmetric, no palpable mass in either breast. No axillary lymphadenopathy. No nipple retraction or discharge. The right chest port site shows an eroded 1 cm opening with exposed port. No surrounding erythema or purulent drainage The left axillary incision is well-healed. Medially towards the breast, there is a palpable seroma but no skin changes. No hematoma or seroma identified in the axilla. She still has tenderness   in the axilla. CV: Regular rate  and rhythm; no murmurs; extremities well-perfused with no edema Abd: +bowel sounds, soft, non-tender, no palpable organomegaly; no palpable hernias Musc: Unable to assess gait; no apparent clubbing or cyanosis in extremities Lymphatic: No palpable cervical or axillary lymphadenopathy Skin: Warm, dry; no sign of jaundice Psychiatric - alert and oriented x 4; calm mood and affect    Assessment & Plan (Damain Broadus K. Gwendolin Briel MD; 07/02/2019 4:49 PM)  INVASIVE DUCTAL CARCINOMA OF LEFT BREAST, STAGE 2 (C50.912)  Current Plans Schedule for Surgery - Left axillary lymph node dissection. The surgical procedure has been discussed with the patient. Potential risks, benefits, alternative treatments, and expected outcomes have been explained. All of the patient's questions at this time have been answered. The likelihood of reaching the patient's treatment goal is good. The patient understand the proposed surgical procedure and wishes to proceed.  Metzli Pollick K. Amirra Herling, MD, FACS [facility]  General/ Trauma Surgery   07/02/2019 4:49 PM   

## 2019-07-02 NOTE — H&P (View-Only) (Signed)
History of Present Illness Paige Perry. Tsuei MD; 07/02/2019 4:46 PM) The patient is a 44 year old female who presents with breast cancer. BREAST [place]  This is a 44 year old female in good health who presented in October 2020 with a recent discovery of a palpable mass in her left breast. This is been present for about a month. She underwent diagnostic mammogram and ultrasound on 01/14/19. Showed a 2.3 cm mass in the left breast at 12:00. She also had a single enlarged left axillary lymph node measuring 3.5 cm. She underwent biopsy of the mass as well as the axillary lymph node. 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   On 02/05/19 she underwent ultrasound-guided port placement. She began neoadjuvant chemotherapy. She had a follow-up MRI on 05/29/19. This showed complete imaging resolution of the malignancy in the upper outer left breast. The 2 enlarged left axillary lymph nodes are markedly improved.   On 06/13/19, she underwent left radioactive seed low-cholesterol lumpectomy and a left axillary targeted lymph node dissection. We performed through the same incision. She had no residual cancer at her original tumor site. Unfortunately, 2 out of 3 axillary lymph nodes were positive for cancer. After discussion with Dr. Lindi Adie, she presents now to discuss completion lymph node dissection. She also no longer needs her port.   Problem List/Past Medical Rodman Key K. Tsuei, MD; 07/02/2019 4:46 PM) INVASIVE DUCTAL CARCINOMA OF LEFT BREAST, STAGE 2 (C50.912)  Diagnostic Studies History (Matthew K. Tsuei, MD; 07/02/2019 4:46 PM) Colonoscopy never Mammogram within last year Pap Smear 1-5 years ago  Allergies Sabino Gasser, [place]; 07/02/2019 4:06 PM) Corn Oil *CHEMICALS* Allergies Reconciled  Medication History Sabino Gasser, CMA; 07/02/2019 4:06 PM) Rosuvastatin Calcium  (20MG Tablet, Oral) Active. Pantoprazole Sodium (40MG Tablet DR, Oral) Active. Medications Reconciled  Social History Paige Perry. Tsuei, MD; 07/02/2019 4:46 PM) Alcohol use Recently quit alcohol use. Caffeine use Carbonated beverages. Illicit drug use Remotely quit drug use. Tobacco use Never smoker.  Family History Paige Perry. Tsuei, MD; 07/02/2019 4:46 PM) Arthritis Mother. Breast Cancer Family Members In General. Heart Disease Family Members In General. Migraine Headache Sister.  Pregnancy / Birth History Paige Perry. Tsuei, MD; 07/02/2019 4:46 PM) Age at menarche 31 years. Contraceptive History Oral contraceptives. Gravida 2 Irregular periods Maternal age 44-20 Para 2  Other Problems Paige Perry. Tsuei, MD; 07/02/2019 4:46 PM) Arthritis Hypercholesterolemia    Vitals Sabino Gasser CMA; 07/02/2019 4:07 PM) 07/02/2019 4:06 PM Weight: 197.2 lb Height: 63in Body Surface Area: 1.92 m Body Mass Index: 34.93 kg/m  Temp.: 97.72F(Tympanic)  Pulse: 134 (Regular)  BP: 110/62 (Sitting, Left Arm, Standard)        Physical Exam Rodman Key K. Tsuei MD; 07/02/2019 4:48 PM)  The physical exam findings are as follows: Note:Constitutional: WDWN in NAD, conversant, no obvious deformities; Eyes: Pupils equal, round; sclera anicteric; moist conjunctiva; no lid lag HENT: Oral mucosa moist; good dentition Neck: No masses palpated, trachea midline; no thyromegaly Lungs: CTA bilaterally; normal respiratory effort Breasts: Symmetric, no palpable mass in either breast. No axillary lymphadenopathy. No nipple retraction or discharge. The right chest port site shows an eroded 1 cm opening with exposed port. No surrounding erythema or purulent drainage The left axillary incision is well-healed. Medially towards the breast, there is a palpable seroma but no skin changes. No hematoma or seroma identified in the axilla. She still has tenderness  in the axilla. CV: Regular rate  and rhythm; no murmurs; extremities well-perfused with no edema Abd: +bowel sounds, soft, non-tender, no palpable organomegaly; no palpable hernias Musc: Unable to assess gait; no apparent clubbing or cyanosis in extremities Lymphatic: No palpable cervical or axillary lymphadenopathy Skin: Warm, dry; no sign of jaundice Psychiatric - alert and oriented x 4; calm mood and affect    Assessment & Plan Rodman Key K. Tsuei MD; 07/02/2019 4:49 PM)  INVASIVE DUCTAL CARCINOMA OF LEFT BREAST, STAGE 2 (C50.912)  Current Plans Schedule for Surgery - Left axillary lymph node dissection. The surgical procedure has been discussed with the patient. Potential risks, benefits, alternative treatments, and expected outcomes have been explained. All of the patient's questions at this time have been answered. The likelihood of reaching the patient's treatment goal is good. The patient understand the proposed surgical procedure and wishes to proceed.  Paige Perry. Georgette Dover, MD, [facility]  General/ Trauma Surgery   07/02/2019 4:49 PM

## 2019-07-03 ENCOUNTER — Encounter: Payer: Self-pay | Admitting: *Deleted

## 2019-07-04 ENCOUNTER — Other Ambulatory Visit: Payer: Self-pay

## 2019-07-04 ENCOUNTER — Encounter (HOSPITAL_BASED_OUTPATIENT_CLINIC_OR_DEPARTMENT_OTHER): Payer: Self-pay | Admitting: Surgery

## 2019-07-08 ENCOUNTER — Other Ambulatory Visit: Payer: Self-pay

## 2019-07-08 ENCOUNTER — Other Ambulatory Visit: Payer: Self-pay | Admitting: *Deleted

## 2019-07-08 ENCOUNTER — Encounter: Payer: Self-pay | Admitting: *Deleted

## 2019-07-08 ENCOUNTER — Encounter: Payer: Self-pay | Admitting: Physical Therapy

## 2019-07-08 ENCOUNTER — Ambulatory Visit: Payer: BC Managed Care – PPO | Attending: Surgery | Admitting: Physical Therapy

## 2019-07-08 ENCOUNTER — Other Ambulatory Visit (HOSPITAL_COMMUNITY)
Admission: RE | Admit: 2019-07-08 | Discharge: 2019-07-08 | Disposition: A | Discharge status: Home / Self Care | Payer: BC Managed Care – PPO | Source: Ambulatory Visit | Attending: Surgery | Admitting: Surgery

## 2019-07-08 DIAGNOSIS — C50412 Malignant neoplasm of upper-outer quadrant of left female breast: Secondary | ICD-10-CM

## 2019-07-08 DIAGNOSIS — Z20822 Contact with and (suspected) exposure to covid-19: Secondary | ICD-10-CM | POA: Diagnosis not present

## 2019-07-08 DIAGNOSIS — Z170 Estrogen receptor positive status [ER+]: Secondary | ICD-10-CM

## 2019-07-08 DIAGNOSIS — Z01812 Encounter for preprocedural laboratory examination: Secondary | ICD-10-CM | POA: Insufficient documentation

## 2019-07-08 LAB — SARS CORONAVIRUS 2 (TAT 6-24 HRS): SARS Coronavirus 2: NEGATIVE

## 2019-07-08 NOTE — Therapy (Signed)
[place], [place], [address] Phone: [phone]   Fax:  [phone]  Physical Therapy Treatment  Patient Details  Name: Paige Perry MRN: [id number] Date of Birth: October 27, 1975 Referring Provider (PT): Dr. Donnie Mesa   Encounter Date: 07/08/2019   The patient was assessed using the L-Dex machine today to produce a lymphedema index baseline score. The patient will be reassessed on a regular basis (typically every 3 months) to obtain new L-Dex scores. If the score is > 6.5 points away from his/her baseline score indicating onset of subclinical lymphedema, it will be recommended to wear a compression garment for 4 weeks, 12 hours per day and then be reassessed. If the score continues to be > 6.5 points from baseline at reassessment, we will initiate lymphedema treatment. Assessing in this manner has a 95% rate of preventing clinically significant lymphedema.  Her L-Dex score today was -4.9. She will undergo a left axillary lymph node dissection on 07/11/2019 and will be reassessed with L-Dex screenings every 3 months.  Past Medical History:  Diagnosis Date  . Asthma   . Cancer [facility])    breast  . Family history of breast cancer   . Family history of breast cancer   . Family history of prostate cancer   . Family history of throat cancer   . Hypercholesteremia   . PONV (postoperative nausea and vomiting)     Past Surgical History:  Procedure Laterality Date  . BREAST LUMPECTOMY WITH RADIOACTIVE SEED AND AXILLARY LYMPH NODE DISSECTION Left 06/13/2019   Procedure: LEFT BREAST LUMPECTOMY WITH RADIOACTIVE SEED AND AXILLARY TARGETED LYMPH NODE DISSECTION;  Surgeon: Donnie Mesa, MD;  Location: [place];  Service: General;  Laterality: Left;  . FINGER SURGERY    . PORTACATH PLACEMENT Right 02/05/2019   Procedure: INSERTION PORT-A-CATH WITH ULTRASOUND;  Surgeon: Donnie Mesa, MD;  Location:  [place];  Service: General;  Laterality: Right;  . WOUND EXPLORATION Right 06/13/2019   Procedure: RIGHT CHEST WOUND EXPLORATION;  Surgeon: Donnie Mesa, MD;  Location: [place];  Service: General;  Laterality: Right;    There were no vitals filed for this visit.  Subjective Assessment - 07/08/19 1503    Subjective  Pt here today for an L-Dex screening as she is having an ALND 07/11/2019.    Currently in Pain?  No/denies        Patient will benefit from skilled therapeutic intervention in order to improve the following deficits and impairments:     Visit Diagnosis: Malignant neoplasm of upper-outer quadrant of left breast in female, estrogen receptor positive [facility])     Problem List Patient Active Problem List   Diagnosis Date Noted  . Port-A-Cath in place 03/26/2019  . Genetic testing 02/01/2019  . Family history of prostate cancer   . Family history of throat cancer   . Family history of breast cancer   . Malignant neoplasm of upper-outer quadrant of left breast in female, estrogen receptor positive ([place]) 01/18/2019  . Other allergic rhinitis 12/03/2014   Annia Friendly, PT 07/08/19 3:39 PM  [place], [place], [address] Phone: [phone]   Fax:  [phone]  Name: GEANNINE DHONDT MRN: [id number] Date of Birth: [birth year]

## 2019-07-08 NOTE — Progress Notes (Signed)

## 2019-07-11 ENCOUNTER — Ambulatory Visit (HOSPITAL_BASED_OUTPATIENT_CLINIC_OR_DEPARTMENT_OTHER): Payer: BC Managed Care – PPO | Admitting: Certified Registered"

## 2019-07-11 ENCOUNTER — Other Ambulatory Visit: Payer: Self-pay

## 2019-07-11 ENCOUNTER — Encounter (HOSPITAL_BASED_OUTPATIENT_CLINIC_OR_DEPARTMENT_OTHER): Payer: Self-pay | Admitting: Surgery

## 2019-07-11 ENCOUNTER — Encounter (HOSPITAL_BASED_OUTPATIENT_CLINIC_OR_DEPARTMENT_OTHER)
Admission: RE | Disposition: A | Discharge status: Home / Self Care | Payer: Self-pay | Source: Home / Self Care | Attending: Surgery

## 2019-07-11 ENCOUNTER — Ambulatory Visit (HOSPITAL_BASED_OUTPATIENT_CLINIC_OR_DEPARTMENT_OTHER)
Admission: RE | Admit: 2019-07-11 | Discharge: 2019-07-11 | Disposition: A | Discharge status: Home / Self Care | Payer: BC Managed Care – PPO | Attending: Surgery | Admitting: Surgery

## 2019-07-11 DIAGNOSIS — Z6834 Body mass index (BMI) 34.0-34.9, adult: Secondary | ICD-10-CM | POA: Insufficient documentation

## 2019-07-11 DIAGNOSIS — Z9221 Personal history of antineoplastic chemotherapy: Secondary | ICD-10-CM | POA: Diagnosis not present

## 2019-07-11 DIAGNOSIS — Z803 Family history of malignant neoplasm of breast: Secondary | ICD-10-CM | POA: Insufficient documentation

## 2019-07-11 DIAGNOSIS — E669 Obesity, unspecified: Secondary | ICD-10-CM | POA: Insufficient documentation

## 2019-07-11 DIAGNOSIS — C773 Secondary and unspecified malignant neoplasm of axilla and upper limb lymph nodes: Secondary | ICD-10-CM | POA: Insufficient documentation

## 2019-07-11 DIAGNOSIS — Z853 Personal history of malignant neoplasm of breast: Secondary | ICD-10-CM | POA: Diagnosis not present

## 2019-07-11 HISTORY — PX: AXILLARY LYMPH NODE DISSECTION: SHX5229

## 2019-07-11 SURGERY — LYMPHADENECTOMY, AXILLARY
Anesthesia: General | Site: Axilla | Laterality: Left

## 2019-07-11 MED ORDER — MIDAZOLAM HCL 2 MG/2ML IJ SOLN
INTRAMUSCULAR | Status: AC
  Filled 2019-07-11: qty 2

## 2019-07-11 MED ORDER — LACTATED RINGERS IV SOLN: INTRAVENOUS | Status: DC

## 2019-07-11 MED ORDER — LIDOCAINE 2% (20 MG/ML) 5 ML SYRINGE
INTRAMUSCULAR | Status: DC | PRN
  Administered 2019-07-11: 60 mg via INTRAVENOUS

## 2019-07-11 MED ORDER — FENTANYL CITRATE (PF) 100 MCG/2ML IJ SOLN
INTRAMUSCULAR | Status: AC
  Filled 2019-07-11: qty 2

## 2019-07-11 MED ORDER — CEFAZOLIN SODIUM-DEXTROSE 2-4 GM/100ML-% IV SOLN
2.0000 g | INTRAVENOUS | Status: AC
  Administered 2019-07-11: 2 g via INTRAVENOUS

## 2019-07-11 MED ORDER — ONDANSETRON HCL 4 MG/2ML IJ SOLN
INTRAMUSCULAR | Status: DC | PRN
  Administered 2019-07-11: 4 mg via INTRAVENOUS

## 2019-07-11 MED ORDER — DROPERIDOL 2.5 MG/ML IJ SOLN
INTRAMUSCULAR | Status: DC | PRN
  Administered 2019-07-11: .625 mg via INTRAVENOUS

## 2019-07-11 MED ORDER — DEXAMETHASONE SODIUM PHOSPHATE 10 MG/ML IJ SOLN
INTRAMUSCULAR | Status: AC
  Filled 2019-07-11: qty 1

## 2019-07-11 MED ORDER — CHLORHEXIDINE GLUCONATE CLOTH 2 % EX PADS: 6.0000 | MEDICATED_PAD | Freq: Once | CUTANEOUS | Status: DC

## 2019-07-11 MED ORDER — ACETAMINOPHEN 500 MG PO TABS
1000.0000 mg | ORAL_TABLET | ORAL | Status: AC
  Administered 2019-07-11: 12:00:00 1000 mg via ORAL

## 2019-07-11 MED ORDER — ACETAMINOPHEN 500 MG PO TABS
ORAL_TABLET | ORAL | Status: AC
  Filled 2019-07-11: qty 2

## 2019-07-11 MED ORDER — HYDROMORPHONE HCL 1 MG/ML IJ SOLN
INTRAMUSCULAR | Status: AC
  Filled 2019-07-11: qty 0.5

## 2019-07-11 MED ORDER — FENTANYL CITRATE (PF) 100 MCG/2ML IJ SOLN
INTRAMUSCULAR | Status: DC | PRN
  Administered 2019-07-11 (×4): 25 ug via INTRAVENOUS

## 2019-07-11 MED ORDER — PROPOFOL 10 MG/ML IV BOLUS
INTRAVENOUS | Status: DC | PRN
  Administered 2019-07-11: 200 mg via INTRAVENOUS

## 2019-07-11 MED ORDER — GABAPENTIN 300 MG PO CAPS
300.0000 mg | ORAL_CAPSULE | ORAL | Status: AC
  Administered 2019-07-11: 300 mg via ORAL

## 2019-07-11 MED ORDER — CEFAZOLIN SODIUM-DEXTROSE 2-4 GM/100ML-% IV SOLN
INTRAVENOUS | Status: AC
  Filled 2019-07-11: qty 100

## 2019-07-11 MED ORDER — ONDANSETRON HCL 4 MG/2ML IJ SOLN
INTRAMUSCULAR | Status: AC
  Filled 2019-07-11: qty 2

## 2019-07-11 MED ORDER — HYDROMORPHONE HCL 1 MG/ML IJ SOLN
0.2500 mg | INTRAMUSCULAR | Status: DC | PRN
  Administered 2019-07-11: 0.5 mg via INTRAVENOUS

## 2019-07-11 MED ORDER — GABAPENTIN 300 MG PO CAPS
ORAL_CAPSULE | ORAL | Status: AC
  Filled 2019-07-11: qty 1

## 2019-07-11 MED ORDER — OXYCODONE HCL 5 MG/5ML PO SOLN: 5.0000 mg | Freq: Once | ORAL | Status: DC | PRN

## 2019-07-11 MED ORDER — MEPERIDINE HCL 25 MG/ML IJ SOLN: 6.2500 mg | INTRAMUSCULAR | Status: DC | PRN

## 2019-07-11 MED ORDER — FENTANYL CITRATE (PF) 100 MCG/2ML IJ SOLN
50.0000 ug | INTRAMUSCULAR | Status: DC | PRN
  Administered 2019-07-11: 100 ug via INTRAVENOUS

## 2019-07-11 MED ORDER — BUPIVACAINE HCL 0.25 % IJ SOLN
INTRAMUSCULAR | Status: DC | PRN
  Administered 2019-07-11: 10 mL

## 2019-07-11 MED ORDER — MIDAZOLAM HCL 2 MG/2ML IJ SOLN
1.0000 mg | INTRAMUSCULAR | Status: DC | PRN
  Administered 2019-07-11: 2 mg via INTRAVENOUS

## 2019-07-11 MED ORDER — PROMETHAZINE HCL 25 MG/ML IJ SOLN: 6.2500 mg | INTRAMUSCULAR | Status: DC | PRN

## 2019-07-11 MED ORDER — LIDOCAINE 2% (20 MG/ML) 5 ML SYRINGE
INTRAMUSCULAR | Status: AC
  Filled 2019-07-11: qty 5

## 2019-07-11 MED ORDER — OXYCODONE HCL 5 MG PO TABS: 5.0000 mg | ORAL_TABLET | Freq: Once | ORAL | Status: DC | PRN

## 2019-07-11 MED ORDER — ROPIVACAINE HCL 5 MG/ML IJ SOLN
INTRAMUSCULAR | Status: DC | PRN
  Administered 2019-07-11: 30 mL via PERINEURAL

## 2019-07-11 MED ORDER — DEXAMETHASONE SODIUM PHOSPHATE 4 MG/ML IJ SOLN
INTRAMUSCULAR | Status: DC | PRN
  Administered 2019-07-11: 5 mg via INTRAVENOUS

## 2019-07-11 SURGICAL SUPPLY — 51 items
APL PRP STRL LF DISP 70% ISPRP (MISCELLANEOUS) ×2
APL SKNCLS STERI-STRIP NONHPOA (GAUZE/BANDAGES/DRESSINGS) ×2
BENZOIN TINCTURE PRP APPL 2/3 (GAUZE/BANDAGES/DRESSINGS) ×4 IMPLANT
BINDER BREAST XLRG (GAUZE/BANDAGES/DRESSINGS) ×4 IMPLANT
BIOPATCH RED 1 DISK 7.0 (GAUZE/BANDAGES/DRESSINGS) ×1 IMPLANT
BIOPATCH RED 1IN DISK 7.0MM (GAUZE/BANDAGES/DRESSINGS) ×1
BLADE SURG 15 STRL LF DISP TIS (BLADE) ×2 IMPLANT
BLADE SURG 15 STRL SS (BLADE) ×4
CANISTER SUCT 1200ML W/VALVE (MISCELLANEOUS) ×4 IMPLANT
CHLORAPREP W/TINT 26 (MISCELLANEOUS) ×4 IMPLANT
CLOSURE WOUND 1/2 X4 (GAUZE/BANDAGES/DRESSINGS) ×1
COVER MAYO STAND STRL (DRAPES) ×4 IMPLANT
DRAIN CHANNEL 19F RND (DRAIN) ×4 IMPLANT
DRAPE UTILITY XL STRL (DRAPES) ×4 IMPLANT
DRSG TEGADERM 4X10 (GAUZE/BANDAGES/DRESSINGS) ×4 IMPLANT
DRSG TEGADERM 4X4.75 (GAUZE/BANDAGES/DRESSINGS) ×2 IMPLANT
ELECT BLADE 4.0 EZ CLEAN MEGAD (MISCELLANEOUS) ×4
ELECT REM PT RETURN 9FT ADLT (ELECTROSURGICAL) ×4
ELECTRODE BLDE 4.0 EZ CLN MEGD (MISCELLANEOUS) IMPLANT
ELECTRODE REM PT RTRN 9FT ADLT (ELECTROSURGICAL) ×2 IMPLANT
EVACUATOR SILICONE 100CC (DRAIN) ×4 IMPLANT
GAUZE SPONGE 4X4 12PLY STRL LF (GAUZE/BANDAGES/DRESSINGS) ×2 IMPLANT
GLOVE BIOGEL PI IND STRL 7.5 (GLOVE) ×2 IMPLANT
GLOVE BIOGEL PI INDICATOR 7.5 (GLOVE) ×2
GLOVE SURG SS PI 6.5 STRL IVOR (GLOVE) ×2 IMPLANT
GLOVE SURG SS PI 7.0 STRL IVOR (GLOVE) ×4 IMPLANT
GOWN STRL REUS W/ TWL LRG LVL3 (GOWN DISPOSABLE) ×2 IMPLANT
GOWN STRL REUS W/TWL LRG LVL3 (GOWN DISPOSABLE) ×8
NDL HYPO 25X1 1.5 SAFETY (NEEDLE) ×2 IMPLANT
NEEDLE HYPO 25X1 1.5 SAFETY (NEEDLE) ×4 IMPLANT
NS IRRIG 1000ML POUR BTL (IV SOLUTION) ×2 IMPLANT
PACK BASIN DAY SURGERY FS (CUSTOM PROCEDURE TRAY) ×4 IMPLANT
PACK UNIVERSAL I (CUSTOM PROCEDURE TRAY) ×4 IMPLANT
PENCIL SMOKE EVACUATOR (MISCELLANEOUS) ×4 IMPLANT
PIN SAFETY STERILE (MISCELLANEOUS) ×4 IMPLANT
SLEEVE SCD COMPRESS KNEE MED (MISCELLANEOUS) ×4 IMPLANT
SPONGE INTESTINAL PEANUT (DISPOSABLE) ×4 IMPLANT
SPONGE LAP 18X18 RF (DISPOSABLE) ×2 IMPLANT
SPONGE LAP 4X18 RFD (DISPOSABLE) ×4 IMPLANT
STAPLER VISISTAT 35W (STAPLE) ×2 IMPLANT
STRIP CLOSURE SKIN 1/2X4 (GAUZE/BANDAGES/DRESSINGS) ×3 IMPLANT
SUT CHROMIC 3 0 SH 27 (SUTURE) IMPLANT
SUT ETHILON 2 0 FS 18 (SUTURE) ×4 IMPLANT
SUT MON AB 4-0 PC3 18 (SUTURE) ×4 IMPLANT
SUT VIC AB 3-0 SH 27 (SUTURE) ×4
SUT VIC AB 3-0 SH 27X BRD (SUTURE) ×2 IMPLANT
SYR CONTROL 10ML LL (SYRINGE) ×4 IMPLANT
TOWEL GREEN STERILE FF (TOWEL DISPOSABLE) ×4 IMPLANT
TUBE CONNECTING 20'X1/4 (TUBING) ×1
TUBE CONNECTING 20X1/4 (TUBING) ×3 IMPLANT
YANKAUER SUCT BULB TIP NO VENT (SUCTIONS) ×4 IMPLANT

## 2019-07-11 NOTE — Anesthesia Preprocedure Evaluation (Signed)
Anesthesia Evaluation  Patient identified by MRN, date of birth, ID band Patient awake    Reviewed: Allergy & Precautions, NPO status , Patient's Chart, lab work & pertinent test results  History of Anesthesia Complications (+) PONV and history of anesthetic complications  Airway Mallampati: II  TM Distance: >3 FB Neck ROM: Full    Dental  (+) Teeth Intact, Dental Advisory Given   Pulmonary asthma ,    Pulmonary exam normal breath sounds clear to auscultation       Cardiovascular negative cardio ROS Normal cardiovascular exam Rhythm:Regular Rate:Normal     Neuro/Psych negative neurological ROS     GI/Hepatic Neg liver ROS, GERD  Medicated,  Endo/Other  Obesity   Renal/GU negative Renal ROS     Musculoskeletal negative musculoskeletal ROS (+)   Abdominal (+) + obese,   Peds  Hematology negative hematology ROS (+)   Anesthesia Other Findings Day of surgery medications reviewed with the patient.  Breast cancer  Reproductive/Obstetrics                             Anesthesia Physical  Anesthesia Plan  ASA: III  Anesthesia Plan: General   Post-op Pain Management:    Induction: Intravenous  PONV Risk Score and Plan: 4 or greater and Midazolam, Dexamethasone, Ondansetron and Droperidol  Airway Management Planned: LMA  Additional Equipment:   Intra-op Plan:   Post-operative Plan: Extubation in OR  Informed Consent: I have reviewed the patients History and Physical, chart, labs and discussed the procedure including the risks, benefits and alternatives for the proposed anesthesia with the patient or authorized representative who has indicated his/her understanding and acceptance.     Dental advisory given  Plan Discussed with: CRNA  Anesthesia Plan Comments:         Anesthesia Quick Evaluation

## 2019-07-11 NOTE — Op Note (Signed)
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  She underwent port placement as well as neoadjuvant chemotherapy.  She had complete resolution of her primary cancer.  On 06/13/2019 we performed a left lumpectomy and targeted axillary lymph node dissection.  She had no remaining cancer at her primary tumor site but she did have 2+ axillary lymph nodes via completion dissection.  115 twice daily  Description of procedure: Patient is brought to the operating room and placed in the supine position on the operating room table with her arms extended on arm boards.  After an adequate level of general anesthesia was obtained, her left chest and axilla were prepped with ChloraPrep and draped sterile fashion.  Timeout was taken to ensure the proper patient and proper procedure.  We infiltrated her old scar with 0.25% Marcaine.  I made an incision through her old scar.  We dissected through the scar tissue in the subcutaneous adipose tissue up into the axilla.  We identified the edge of a pectoralis muscle.  We opened the clavipectoral fascia of and into the axilla.  Retractor was applied to hold the muscle superiorly.  We bluntly dissected the axillary contents of the chest wall.  We dissected superiorly until we could clearly identify the axillary vein.  The long thoracic condyle was also identified and preserved on the chest wall.  We dissected laterally until I was able to identify the thoracodorsal bundle.  We then took the axillary contents from superior to inferior.  We avoided due to large nerve bundles at were able to dissect  axillary contents free.  2 small veins were ligated with clips.  Several firm and large palpable lymph nodes were identified within the axillary contents which included level 1 and level 2.  This was sent for pathologic examination.  We inspected carefully for hemostasis.  I created a small stab incision and inserted a 19 French drain which was placed into the axilla.  The drain was secured with 3-0 Ethilon.  The incision was closed with a deep layer of 3-0 Vicryl and a subcuticular layer 4-0 Monocryl.  Steri-Strips were applied.  Patient was then extubated and brought to the recovery room in stable condition.  All sponge, instrument, and needle counts are correct.  Paige Perry. Paige Dover, MD, [facility] Surgery  General/ Trauma Surgery   07/11/2019 4:51 PM

## 2019-07-11 NOTE — Progress Notes (Signed)
Assisted Dr. Miller with left, ultrasound guided, pectoralis block. Side rails up, monitors on throughout procedure. See vital signs in flow sheet. Tolerated Procedure well. 

## 2019-07-11 NOTE — Transfer of Care (Signed)
Immediate Anesthesia Transfer of Care Note  Patient: Paige Perry  Procedure(s) Performed: LEFT AXILLARY LYMPH NODE DISSECTION (Left Axilla)  Patient Location: PACU  Anesthesia Type:GA combined with regional for post-op pain  Level of Consciousness: sedated  Airway & Oxygen Therapy: Patient Spontanous Breathing and Patient connected to nasal cannula oxygen  Post-op Assessment: Report given to RN and Post -op Vital signs reviewed and stable  Post vital signs: Reviewed and stable  Last Vitals:  Vitals Value Taken Time  BP 115/79 07/11/19 1522  Temp    Pulse 76 07/11/19 1528  Resp 16 07/11/19 1528  SpO2 100 % 07/11/19 1528  Vitals shown include unvalidated device data.  Last Pain:  Vitals:   07/11/19 1159  TempSrc: Oral  PainSc: 0-No pain         Complications: No apparent anesthesia complications

## 2019-07-11 NOTE — Anesthesia Procedure Notes (Signed)
Anesthesia Regional Block: Pectoralis block   Pre-Anesthetic Checklist: ,, timeout performed, Correct Patient, Correct Site, Correct Laterality, Correct Procedure, Correct Position, site marked, Risks and benefits discussed,  Surgical consent,  Pre-op evaluation,  At surgeon's request and post-op pain management  Laterality: Left  Prep: chloraprep       Needles:  Injection technique: Single-shot  Needle Type: Stimiplex     Needle Length: 9cm  Needle Gauge: 21     Additional Needles:   Procedures:,,,, ultrasound used (permanent image in chart),,,,  Narrative:  Start time: 07/11/2019 1:20 PM End time: 07/11/2019 1:25 PM Injection made incrementally with aspirations every 5 mL.  Performed by: Personally  Anesthesiologist: Lynda Rainwater, MD

## 2019-07-11 NOTE — Anesthesia Procedure Notes (Signed)
Procedure Name: LMA Insertion Date/Time: 07/11/2019 1:43 PM Performed by: Maryella Shivers, CRNA Pre-anesthesia Checklist: Patient identified, Emergency Drugs available, Suction available and Patient being monitored Patient Re-evaluated:Patient Re-evaluated prior to induction Oxygen Delivery Method: Circle system utilized Preoxygenation: Pre-oxygenation with 100% oxygen Induction Type: IV induction Ventilation: Mask ventilation without difficulty LMA: LMA inserted LMA Size: 4.0 Number of attempts: 1 Airway Equipment and Method: Bite block Placement Confirmation: positive ETCO2 Tube secured with: Tape Dental Injury: Teeth and Oropharynx as per pre-operative assessment

## 2019-07-11 NOTE — Discharge Instructions (Signed)
[facility] [phone]   POST OP INSTRUCTIONS  Always review your discharge instruction sheet given to you by the facility where your surgery was performed. IF YOU HAVE DISABILITY OR FAMILY LEAVE FORMS, YOU MUST BRING THEM TO THE OFFICE FOR PROCESSING.   DO NOT GIVE THEM TO YOUR DOCTOR. A prescription for pain medication may be given to you upon discharge.  Take your pain medication as prescribed, if needed.  If narcotic pain medicine is not needed, then you may take acetaminophen (Tylenol) or ibuprofen (Advil) as needed. No Tylenol until 6:30pm if needed. 1. Take your usually prescribed medications unless otherwise directed. 2. If you need a refill on your pain medication, please contact your pharmacy.  They will contact our office to request authorization.  Prescriptions will not be filled after 5pm or on week-ends. 3. You should follow a light diet the first few days after arrival home, such as soup and crackers, etc.  Resume your normal diet the day after surgery. 4. Most patients will experience some swelling and bruising in the underarm.  Ice packs will help.  Swelling and bruising can take several days to resolve.  5. It is common to experience some constipation if taking pain medication after surgery.  Increasing fluid intake and taking a stool softener (such as Colace) will usually help or prevent this problem from occurring.  A mild laxative (Milk of Magnesia or Miralax) should be taken according to package instructions if there are no bowel movements after 48 hours. 6. DRAINS:  If you have drains in place, it is important to keep a list of the amount of drainage produced each day in your drains.  Before leaving the hospital, you should be instructed on drain care.  Call our office if you have any questions about your drains.  You cannot shower with the drain in place, so you will need to just use sponge baths until the drain is removed. 7. ACTIVITIES:  You may  resume regular (light) daily activities beginning the next day--such as daily self-care, walking, climbing stairs--gradually increasing activities as tolerated.  You may have sexual intercourse when it is comfortable.  Refrain from any heavy lifting or straining until approved by your doctor. a. You may drive when you are no longer taking prescription pain medication, you can comfortably wear a seatbelt, and you can safely maneuver your car and apply brakes. b. RETURN TO WORK:  __________________________________________________________ 8. You should see your doctor in the office for a follow-up appointment approximately 3-5 days after your surgery.  Your doctor's nurse will typically make your follow-up appointment when she calls you with your pathology report.  Expect your pathology report 2-3 business days after your surgery.  You may call to check if you do not hear from [place] after three days.   9. OTHER INSTRUCTIONS: ______________________________________________________________________________________________ ____________________________________________________________________________________________ WHEN TO CALL YOUR DOCTOR: 1. Fever over 101.0 2. Nausea and/or vomiting 3. Extreme swelling or bruising 4. Continued bleeding from incision. 5. Increased pain, redness, or drainage from the incision. The clinic staff is available to answer your questions during regular business hours.  Please don't hesitate to call and ask to speak to one of the nurses for clinical concerns.  If you have a medical emergency, go to the nearest emergency room or call 911.  A surgeon from [facility] is always on call at the hospital. [address], [place], [place], [place]  [address] ? [address], [place], [place]   [address] [phone]  [phone] ? [phone] ? FAX [phone] Web site: [place] Instructions  Activity: Get plenty of rest for the remainder of the day. A  responsible individual must stay with you for 24 hours following the procedure.  For the next 24 hours, DO NOT: -Drive a car -Paediatric nurse -Drink alcoholic beverages -Take any medication unless instructed by your physician -Make any legal decisions or sign important papers.  Meals: Start with liquid foods such as gelatin or soup. Progress to regular foods as tolerated. Avoid greasy, spicy, heavy foods. If nausea and/or vomiting occur, drink only clear liquids until the nausea and/or vomiting subsides. Call your physician if vomiting continues.  Special Instructions/Symptoms: Your throat may feel dry or sore from the anesthesia or the breathing tube placed in your throat during surgery. If this causes discomfort, gargle with warm salt water. The discomfort should disappear within 24 hours.  If you had a scopolamine patch placed behind your ear for the management of post- operative nausea and/or vomiting:  1. The medication in the patch is effective for 72 hours, after which it should be removed.  Wrap patch in a tissue and discard in the trash. Wash hands thoroughly with soap and water. 2. You may remove the patch earlier than 72 hours if you experience unpleasant side effects which may include dry mouth, dizziness or visual disturbances. 3. Avoid touching the patch. Wash your hands with soap and water after contact with the patch.    About my Jackson-Pratt Bulb Drain  What is a Jackson-Pratt bulb? A Jackson-Pratt is a soft, round device used to collect drainage. It is connected to a long, thin drainage catheter, which is held in place by one or two small stiches near your surgical incision site. When the bulb is squeezed, it forms a vacuum, forcing the drainage to empty into the bulb.  Emptying the Jackson-Pratt bulb- To empty the bulb: 1. Release the plug on the top of the bulb. 2. Pour the bulb's contents into a measuring container which your nurse will provide. 3. Record the time  emptied and amount of drainage. Empty the drain(s) as often as your     doctor or nurse recommends.  Date                  Time                    Amount (Drain 1)                 Amount (Drain 2)  _____________________________________________________________________  _____________________________________________________________________  _____________________________________________________________________  _____________________________________________________________________  _____________________________________________________________________  _____________________________________________________________________  _____________________________________________________________________  _____________________________________________________________________  Squeezing the Jackson-Pratt Bulb- To squeeze the bulb: 1. Make sure the plug at the top of the bulb is open. 2. Squeeze the bulb tightly in your fist. You will hear air squeezing from the bulb. 3. Replace the plug while the bulb is squeezed. 4. Use a safety pin to attach the bulb to your clothing. This will keep the catheter from     pulling at the bulb insertion site.  When to call your doctor- Call your doctor if:  Drain site becomes red, swollen or hot.  You have a fever greater than 101 degrees F.  There is oozing at the drain site.  Drain falls out (apply a guaze bandage over the drain hole and secure it with tape).  Drainage increases daily not related to activity patterns. (You will usually have more  drainage when you are active than when you are resting.)  Drainage has a bad odor.

## 2019-07-11 NOTE — Interval H&P Note (Signed)
History and Physical Interval Note:  07/11/2019 1:10 PM  [place]  has presented today for surgery, with the diagnosis of LEFT BREAST CANCER.  The various methods of treatment have been discussed with the patient and family. After consideration of risks, benefits and other options for treatment, the patient has consented to  Procedure(s) with comments: LEFT AXILLARY LYMPH NODE DISSECTION (Left) - LMA as a surgical intervention.  The patient's history has been reviewed, patient examined, no change in status, stable for surgery.  I have reviewed the patient's chart and labs.  Questions were answered to the patient's satisfaction.     Maia Petties

## 2019-07-12 NOTE — Anesthesia Postprocedure Evaluation (Signed)
Anesthesia Post Note  Patient: Paige Perry  Procedure(s) Performed: LEFT AXILLARY LYMPH NODE DISSECTION (Left Axilla)     Patient location during evaluation: PACU Anesthesia Type: General Level of consciousness: awake and alert Pain management: pain level controlled Vital Signs Assessment: post-procedure vital signs reviewed and stable Respiratory status: spontaneous breathing, nonlabored ventilation and respiratory function stable Cardiovascular status: blood pressure returned to baseline and stable Postop Assessment: no apparent nausea or vomiting Anesthetic complications: no    Last Vitals:  Vitals:   07/11/19 1700 07/11/19 1730  BP: 107/80 112/76  Pulse: 88 72  Resp: 11 18  Temp:  37.1 C  SpO2: 95% 100%    Last Pain:  Vitals:   07/11/19 1730  TempSrc:   PainSc: 2                  Lynda Rainwater

## 2019-07-15 LAB — SURGICAL PATHOLOGY

## 2019-07-16 ENCOUNTER — Encounter: Payer: Self-pay | Admitting: *Deleted

## 2019-07-24 ENCOUNTER — Encounter: Payer: Self-pay | Admitting: *Deleted

## 2019-07-31 ENCOUNTER — Telehealth: Payer: Self-pay

## 2019-07-31 NOTE — Telephone Encounter (Signed)
Appointment reminder patient verbalize she understood this is a in person appointment

## 2019-08-01 ENCOUNTER — Encounter: Payer: Self-pay | Admitting: *Deleted

## 2019-08-01 ENCOUNTER — Telehealth: Payer: Self-pay | Admitting: Hematology and Oncology

## 2019-08-01 ENCOUNTER — Ambulatory Visit
Admission: RE | Admit: 2019-08-01 | Discharge: 2019-08-01 | Disposition: A | Discharge status: Home / Self Care | Payer: BC Managed Care – PPO | Source: Ambulatory Visit | Attending: Radiation Oncology | Admitting: Radiation Oncology

## 2019-08-01 ENCOUNTER — Encounter: Payer: Self-pay | Admitting: Radiation Oncology

## 2019-08-01 ENCOUNTER — Other Ambulatory Visit: Payer: Self-pay | Admitting: Radiation Oncology

## 2019-08-01 ENCOUNTER — Other Ambulatory Visit: Payer: Self-pay

## 2019-08-01 VITALS — BP 104/83 | Temp 98.5°F | Resp 18 | Ht 63.0 in | Wt 193.8 lb

## 2019-08-01 DIAGNOSIS — C50412 Malignant neoplasm of upper-outer quadrant of left female breast: Secondary | ICD-10-CM

## 2019-08-01 DIAGNOSIS — Z170 Estrogen receptor positive status [ER+]: Secondary | ICD-10-CM

## 2019-08-01 DIAGNOSIS — Z510 Encounter for antineoplastic radiation therapy: Secondary | ICD-10-CM | POA: Diagnosis present

## 2019-08-01 LAB — PREGNANCY, URINE: Preg Test, Ur: NEGATIVE

## 2019-08-01 NOTE — Progress Notes (Signed)
Radiation Oncology         [phone] ________________________________  Name: FLOREINE Perry        MRN: [id number]  Date of Service: 08/01/2019 DOB: 06/16/1975  UO:[id number], Joelene Millin, NP  Nicholas Lose, MD     REFERRING PHYSICIAN: Nicholas Lose, MD   DIAGNOSIS: The encounter diagnosis was Malignant neoplasm of upper-outer quadrant of left breast in female, estrogen receptor positive ([place]).   HISTORY OF PRESENT ILLNESS: Paige Perry is a 44 y.o. female originally seen in the multidisciplinary breast clinic for a new diagnosis of left breast cancer. The patient was noted to have a palpable mass in the left breast.  She sought further evaluation and diagnostic imaging revealed a mass in the left breast at 12:00.  Further diagnostic ultrasound measured this area at 2.3 x 1.5 x 1.4 cm.  Her left axilla revealed 1 markedly enlarged lymph node measuring 3.5 x 2.3 x 2.6 cm.  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  With the HER-2 component of her cancer, she proceeded with neoadjuvant chemotherapy beginning on 02/13/2019 with her last dose on 05/28/2019.  She proceeded with MRI of the breast on 05/29/2019 which revealed complete resolution of her known disease in the left breast and marked interval improvement in 2 left axillary lymph nodes.  She underwent left breast lumpectomy and targeted axillary lymph node dissection on 06/13/2019.  Final pathology revealed no residual carcinoma in the left breast specimen, metastatic cancer was noted within 2 of the 3 sampled lymph nodes with LVSI present.  She underwent reexcision of the axilla on 07/11/2019 and 3 lymph nodes were removed, all 3 contained metastatic disease with the largest focus measuring 1.1 cm without evidence of extranodal extension.  She is seen today to discuss the rationale for  adjuvant radiotherapy to the breast and regional lymph nodes on the left side.    PREVIOUS RADIATION THERAPY: No   PAST MEDICAL HISTORY:  Past Medical History:  Diagnosis Date  . Asthma   . Cancer [facility])    breast  . Family history of breast cancer   . Family history of breast cancer   . Family history of prostate cancer   . Family history of throat cancer   . Hypercholesteremia   . PONV (postoperative nausea and vomiting)        PAST SURGICAL HISTORY: Past Surgical History:  Procedure Laterality Date  . AXILLARY LYMPH NODE DISSECTION Left 07/11/2019   Procedure: LEFT AXILLARY LYMPH NODE DISSECTION;  Surgeon: Donnie Mesa, MD;  Location: [place];  Service: General;  Laterality: Left;  . BREAST LUMPECTOMY WITH RADIOACTIVE SEED AND AXILLARY LYMPH NODE DISSECTION Left 06/13/2019   Procedure: LEFT BREAST LUMPECTOMY WITH RADIOACTIVE SEED AND AXILLARY TARGETED LYMPH NODE DISSECTION;  Surgeon: Donnie Mesa, MD;  Location: [place];  Service: General;  Laterality: Left;  . FINGER SURGERY    . PORTACATH PLACEMENT Right 02/05/2019   Procedure: INSERTION PORT-A-CATH WITH ULTRASOUND;  Surgeon: Donnie Mesa, MD;  Location: [place];  Service: General;  Laterality: Right;  . WOUND EXPLORATION Right 06/13/2019   Procedure: RIGHT CHEST WOUND EXPLORATION;  Surgeon: Donnie Mesa, MD;  Location: [place];  Service: General;  Laterality: Right;     FAMILY HISTORY:  Family History  Problem Relation Age of  Onset  . Hypertension Mother   . Heart failure Mother   . Breast cancer Maternal Aunt        dx 44s  . Breast cancer Paternal Aunt   . Prostate cancer Father 3  . Throat cancer Cousin      SOCIAL HISTORY:  reports that she has never smoked. She has never used smokeless tobacco. She reports current alcohol use. She reports that she does not use drugs.  The patient is single and lives in [place].  She works at  [employer] and has a 45 year old and 45 year old.   ALLERGIES: Latex, Other, and Adhesive [tape]   MEDICATIONS:  Current Outpatient Medications  Medication Sig Dispense Refill  . LORazepam (ATIVAN) 0.5 MG tablet Take 1 tablet (0.5 mg total) by mouth at bedtime as needed (Nausea or vomiting). 30 tablet 0  . nabumetone (RELAFEN) 500 MG tablet Take 1,000 mg by mouth 2 (two) times daily.    Marland Kitchen oxyCODONE (OXY IR/ROXICODONE) 5 MG immediate release tablet Take 1 tablet (5 mg total) by mouth every 6 (six) hours as needed for severe pain. 15 tablet 0  . pantoprazole (PROTONIX) 40 MG tablet TAKE 1 TABLET BY MOUTH EVERY DAY 90 tablet 1  . rosuvastatin (CRESTOR) 20 MG tablet Take 20 mg by mouth daily.    Marland Kitchen etonogestrel-ethinyl estradiol (NUVARING) 0.12-0.015 MG/24HR vaginal ring      No current facility-administered medications for this encounter.     REVIEW OF SYSTEMS: On review of systems, the patient reports that she is doing well overall. She reports some mild soreness in her LUE and is working with PT exercises to improve this as well as some miled edema of the LUE compared to the left. No other complaints are noted.  PHYSICAL EXAM:  Wt Readings from Last 3 Encounters:  07/11/19 195 lb 1.7 oz (88.5 kg)  06/20/19 194 lb 11.2 oz (88.3 kg)  06/13/19 194 lb 7.1 oz (88.2 kg)   Temp Readings from Last 3 Encounters:  07/11/19 98.8 F (37.1 C)  06/20/19 98.5 F (36.9 C) (Temporal)  06/13/19 98 F (36.7 C)   BP Readings from Last 3 Encounters:  07/11/19 112/76  06/20/19 113/67  06/13/19 122/86   Pulse Readings from Last 3 Encounters:  07/11/19 72  06/20/19 (!) 105  06/13/19 (!) 104    In general this is a well appearing African American female in no acute distress. She's alert and oriented x4 and appropriate throughout the examination. Cardiopulmonary assessment is negative for acute distress and she exhibits normal effort.  Bilateral breast exam is deferred.    ECOG = 0  0 -  Asymptomatic (Fully active, able to carry on all predisease activities without restriction)  1 - Symptomatic but completely ambulatory (Restricted in physically strenuous activity but ambulatory and able to carry out work of a light or sedentary nature. For example, light housework, office work)  2 - Symptomatic, <50% in bed during the day (Ambulatory and capable of all self care but unable to carry out any work activities. Up and about more than 50% of waking hours)  3 - Symptomatic, >50% in bed, but not bedbound (Capable of only limited self-care, confined to bed or chair 50% or more of waking hours)  4 - Bedbound (Completely disabled. Cannot carry on any self-care. Totally confined to bed or chair)  5 - Death   Eustace Pen MM, Creech RH, Tormey DC, et al. [phone]). "Toxicity and response criteria of the [employer]  [employer] Group". [place] Oncol. 5 (6): 649-55    LABORATORY DATA:  Lab Results  Component Value Date   WBC 8.1 05/28/2019   HGB 9.9 (L) 05/28/2019   HCT 29.7 (L) 05/28/2019   MCV 100.0 05/28/2019   PLT 186 05/28/2019   Lab Results  Component Value Date   NA 142 05/28/2019   K 3.5 05/28/2019   CL 108 05/28/2019   CO2 25 05/28/2019   Lab Results  Component Value Date   ALT 34 05/28/2019   AST 22 05/28/2019   ALKPHOS 86 05/28/2019   BILITOT 0.4 05/28/2019      RADIOGRAPHY: No results found.     IMPRESSION/PLAN: 1. 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 Dr. Lisbeth Renshaw discusses the final results of her pathology findings and reviews the nature of left HER-2 amplified breast disease.  She is doing well since completing chemotherapy.  We discussed the rationale again for proceeding with adjuvant radiotherapy to the breast on the left side and regional lymph nodes.  We discussed the risks, benefits, short, and long term effects of radiotherapy, and the patient is interested in proceeding at the  appropriate interval. We would also plan her treatment with deep inspiration breath hold technique. Written consent is obtained and placed in the chart, a copy was provided to the patient. 2. Contraceptive counseling.  The patient is not sexually active but is counseled that pregnancy would need to be avoided in the midst of radiotherapy.  Stat urine pregnancy testing was ordered this morning, and was negative.   In a visit lasting 45 minutes, greater than 50% of the time was spent face to face discussing her case, and coordinating the patient's care.  The above documentation reflects my direct findings during this shared patient visit. Please see the separate note by Dr. Lisbeth Renshaw on this date for the remainder of the patient's plan of care.    Carola Rhine, PAC

## 2019-08-01 NOTE — Telephone Encounter (Signed)
Unable to reach pt. Left voicemail- scheduled appt on 7/7 per 5/6 sch msg.

## 2019-08-06 DIAGNOSIS — Z510 Encounter for antineoplastic radiation therapy: Secondary | ICD-10-CM | POA: Diagnosis not present

## 2019-08-08 ENCOUNTER — Ambulatory Visit: Payer: BC Managed Care – PPO | Admitting: Radiation Oncology

## 2019-08-09 ENCOUNTER — Ambulatory Visit: Payer: BC Managed Care – PPO | Admitting: Radiation Oncology

## 2019-08-09 ENCOUNTER — Ambulatory Visit: Payer: BC Managed Care – PPO

## 2019-08-12 ENCOUNTER — Ambulatory Visit: Payer: BC Managed Care – PPO

## 2019-08-12 ENCOUNTER — Other Ambulatory Visit: Payer: Self-pay | Admitting: *Deleted

## 2019-08-12 DIAGNOSIS — C50412 Malignant neoplasm of upper-outer quadrant of left female breast: Secondary | ICD-10-CM

## 2019-08-13 ENCOUNTER — Ambulatory Visit: Payer: BC Managed Care – PPO

## 2019-08-14 ENCOUNTER — Ambulatory Visit: Payer: BC Managed Care – PPO

## 2019-08-15 ENCOUNTER — Ambulatory Visit: Payer: BC Managed Care – PPO

## 2019-08-16 ENCOUNTER — Ambulatory Visit: Payer: BC Managed Care – PPO

## 2019-08-18 ENCOUNTER — Ambulatory Visit: Payer: BC Managed Care – PPO

## 2019-08-19 ENCOUNTER — Other Ambulatory Visit: Payer: Self-pay

## 2019-08-19 ENCOUNTER — Ambulatory Visit
Admission: RE | Admit: 2019-08-19 | Discharge: 2019-08-19 | Disposition: A | Discharge status: Home / Self Care | Payer: BC Managed Care – PPO | Source: Ambulatory Visit | Attending: Radiation Oncology | Admitting: Radiation Oncology

## 2019-08-19 ENCOUNTER — Ambulatory Visit: Payer: BC Managed Care – PPO

## 2019-08-19 DIAGNOSIS — Z510 Encounter for antineoplastic radiation therapy: Secondary | ICD-10-CM | POA: Diagnosis not present

## 2019-08-20 ENCOUNTER — Other Ambulatory Visit: Payer: Self-pay

## 2019-08-20 ENCOUNTER — Encounter: Payer: Self-pay | Admitting: Physical Therapy

## 2019-08-20 ENCOUNTER — Ambulatory Visit
Admission: RE | Admit: 2019-08-20 | Discharge: 2019-08-20 | Disposition: A | Discharge status: Home / Self Care | Payer: BC Managed Care – PPO | Source: Ambulatory Visit | Attending: Radiation Oncology | Admitting: Radiation Oncology

## 2019-08-20 ENCOUNTER — Ambulatory Visit: Payer: BC Managed Care – PPO | Attending: Surgery | Admitting: Physical Therapy

## 2019-08-20 DIAGNOSIS — Z483 Aftercare following surgery for neoplasm: Secondary | ICD-10-CM

## 2019-08-20 DIAGNOSIS — R293 Abnormal posture: Secondary | ICD-10-CM | POA: Insufficient documentation

## 2019-08-20 DIAGNOSIS — M79602 Pain in left arm: Secondary | ICD-10-CM | POA: Diagnosis present

## 2019-08-20 DIAGNOSIS — C50412 Malignant neoplasm of upper-outer quadrant of left female breast: Secondary | ICD-10-CM | POA: Diagnosis present

## 2019-08-20 DIAGNOSIS — Z170 Estrogen receptor positive status [ER+]: Secondary | ICD-10-CM | POA: Diagnosis present

## 2019-08-20 DIAGNOSIS — Z510 Encounter for antineoplastic radiation therapy: Secondary | ICD-10-CM | POA: Diagnosis not present

## 2019-08-20 NOTE — Therapy (Signed)
[place], [place], [address] Phone: [phone]   Fax:  [phone]  Physical Therapy Evaluation  Patient Details  Name: Paige Perry MRN: [id number] Date of Birth: [birth year] Referring Provider (PT): Dr Lindi Adie   Encounter Date: 08/20/2019  PT End of Session - 08/20/19 1825    Visit Number  1    Number of Visits  9    Date for PT Re-Evaluation  10/21/19   PT may be on hold during radiation   PT Start Time  1300    PT Stop Time  1345    PT Time Calculation (min)  45 min    Activity Tolerance  Patient tolerated treatment well    Behavior During Therapy  [facility] for tasks assessed/performed       Past Medical History:  Diagnosis Date  . Asthma   . Cancer [facility])    breast  . Family history of breast cancer   . Family history of breast cancer   . Family history of prostate cancer   . Family history of throat cancer   . Hypercholesteremia   . PONV (postoperative nausea and vomiting)     Past Surgical History:  Procedure Laterality Date  . AXILLARY LYMPH NODE DISSECTION Left 07/11/2019   Procedure: LEFT AXILLARY LYMPH NODE DISSECTION;  Surgeon: Donnie Mesa, MD;  Location: [place];  Service: General;  Laterality: Left;  . BREAST LUMPECTOMY WITH RADIOACTIVE SEED AND AXILLARY LYMPH NODE DISSECTION Left 06/13/2019   Procedure: LEFT BREAST LUMPECTOMY WITH RADIOACTIVE SEED AND AXILLARY TARGETED LYMPH NODE DISSECTION;  Surgeon: Donnie Mesa, MD;  Location: [place];  Service: General;  Laterality: Left;  . FINGER SURGERY    . PORTACATH PLACEMENT Right 02/05/2019   Procedure: INSERTION PORT-A-CATH WITH ULTRASOUND;  Surgeon: Donnie Mesa, MD;  Location: [place];  Service: General;  Laterality: Right;  . WOUND EXPLORATION Right 06/13/2019   Procedure: RIGHT CHEST WOUND EXPLORATION;  Surgeon: Donnie Mesa, MD;  Location: [place];   Service: General;  Laterality: Right;    There were no vitals filed for this visit.   Subjective Assessment - 08/20/19 1822    Subjective  Pt says she is in pain , every day all day.  She says the pain has gotten worse since surgery and it is worse at night    Pertinent History  Patient was diagnosed on 01/14/2019 with left grade III invasive ductal carcinoma breast cancer. It measures 2.3 cm and is located in the upper outer quadrant. It is ER positive, PR negative, and HER2 postive with a Ki67 of 80%. She has a known positive axillary lymph node. She underwent a lumpectomy with sentinal node diseection on 3/18/ 2021 and needs to go back for a full ALND on 07/11/2019. She started radiation on 08/19/2019         [facility] PT Assessment - 08/20/19 0001      Assessment   Medical Diagnosis  Left breast cancer    Referring Provider (PT)  Dr Lindi Adie    Onset Date/Surgical Date  01/14/19    Hand Dominance  Left    Prior Therapy  none      Precautions   Precautions  Other (comment)      Restrictions   Weight Bearing Restrictions  No      Balance Screen   Has the patient fallen in the past 6 months  No    Has the patient  had a decrease in activity level because of a fear of falling?   No    Is the patient reluctant to leave their home because of a fear of falling?   No      Home Environment   Living Environment  Private residence    Living Arrangements  Children   Lives with 63 y.o. son   Available Help at Discharge  Family   Sister is moving in     Prior Function   Level of Independence  Independent    Vocation  Full time employment    Civil engineer, contracting at [employer]  She does not exercise      Cognition   Overall Cognitive Status  Within Functional Limits for tasks assessed      Observation/Other Assessments   Observations  visible and palpable  guitar string cord in left medial elbow with tightness in muclses of forearm.  Pt also has firm tightness in upper  traps  healing scar in left axilla with mild fullness proximal to it.       Sensation   Light Touch  Not tested      Coordination   Gross Motor Movements are Fluid and Coordinated  Yes      Posture/Postural Control   Posture/Postural Control  Postural limitations    Postural Limitations  Rounded Shoulders;Forward head      ROM / Strength   AROM / PROM / Strength  AROM;Strength      AROM   Overall AROM   Within functional limits for tasks performed    Overall AROM Comments  Cervical AROM is WNL    AROM Assessment Site  Shoulder    Right/Left Shoulder  Right;Left    Right Shoulder Flexion  160 Degrees    Right Shoulder ABduction  170 Degrees    Right Shoulder External Rotation  90 Degrees    Left Shoulder Flexion  160 Degrees    Left Shoulder ABduction  170 Degrees    Left Shoulder External Rotation  90 Degrees      Strength   Overall Strength  Within functional limits for tasks performed        LYMPHEDEMA/ONCOLOGY QUESTIONNAIRE - 08/20/19 1822      Type   Cancer Type  left breast cancer       Surgeries   Lumpectomy Date  06/13/19    Axillary Lymph Node Dissection Date  07/11/19    Number Lymph Nodes Removed  --   full ALND     Treatment   Past Chemotherapy Treatment  Yes    Active Radiation Treatment  Yes      What other symptoms do you have   Are you Having Heaviness or Tightness  Yes    Are you having Pain  Yes    Are you having pitting edema  No    Is it Hard or Difficult finding clothes that fit  No    Is there Decreased scar mobility  No    Stemmer Sign  No      Right Upper Extremity Lymphedema   10 cm Proximal to Olecranon Process  32.5 cm    Olecranon Process  27 cm    15 cm Proximal to Ulnar Styloid Process  27 cm    10 cm Proximal to Ulnar Styloid Process  23 cm    Just Proximal to Ulnar Styloid Process  16 cm    Across Hand at [employer]  Space  20 cm    At [place] of 2nd Digit  6 cm      Left Upper Extremity Lymphedema   10 cm Proximal to  Olecranon Process  34 cm    Olecranon Process  27 cm    15 cm Proximal to Ulnar Styloid Process  26.7 cm    10 cm Proximal to Ulnar Styloid Process  23.5 cm    Just Proximal to Ulnar Styloid Process  16.6 cm    Across Hand at [employer]  20.2 cm    At [place] of 2nd Digit  5.8 cm               Objective measurements completed on examination: See above findings.      [facility] Adult PT Treatment/Exercise - 08/20/19 0001      Manual Therapy   Manual Therapy  Manual Lymphatic Drainage (MLD)    Manual therapy comments  provided tg soft to left arm for comfort     Manual Lymphatic Drainage (MLD)  briefly, short neck, superficial and deep abdominal, left shoulder arm to hand with hand elevated to decrease lymphatic congestion in left arm. cording still palpable after treatmentr                  PT Long Term Goals - 08/20/19 1837      PT LONG TERM GOAL #1   Title  Pt will report that the pain in her arm is decreased by 50%    Time  8    Period  Weeks    Status  New      PT LONG TERM GOAL #2   Title  Pt will be verbalize lymphedema risk reduction practices    Time  8    Period  Weeks    Status  New      PT LONG TERM GOAL #3   Title  Pt will be verbalize understanding of Strength ABC program to decrease lymphedema risk    Time  8    Period  Weeks    Status  New             Plan - 08/20/19 1828    Clinical Impression Statement  Pt returns to PT with pain in her left arm that goes to wrist . She has palpable and visible cord in left medial elbow.  Upper arm circumference is slightly more on the left. She started radiation on yesterday and works full time at a computer.  She wants to get relief from her pain.    Personal Factors and Comorbidities  Comorbidity 3+    Comorbidities  lumpectomy, full ALND, chemotherapy, ongoing radiation    Examination-Activity Limitations  Reach Overhead;Sleep;Lift    Stability/Clinical Decision Making  Evolving/Moderate  complexity    Clinical Decision Making  Moderate    Rehab Potential  Good    PT Frequency  2x / week    PT Duration  8 weeks   PT likely to be on hold during radiation   PT Treatment/Interventions  ADLs/Self Care Home Management;Therapeutic exercise;Patient/family education;Orthotic Fit/Training;Manual lymph drainage;Compression bandaging;Manual techniques;Taping    PT Next Visit Plan  see if tg soft helped...  Arrange for pt to get a compression sleeve and glove at [place] .  Perform manual techniques and stretching to decrease cording. Teach lymphedema risk reduction practices and get her set up for ABC class  and perform SOZO screening  HEP for stretching    Consulted  and Agree with Plan of Care  Patient       Patient will benefit from skilled therapeutic intervention in order to improve the following deficits and impairments:  Postural dysfunction, Decreased range of motion, Decreased knowledge of precautions, Pain, Impaired UE functional use, Increased fascial restricitons, Increased edema, Decreased scar mobility  Visit Diagnosis: Aftercare following surgery for neoplasm - Plan: PT plan of care cert/re-cert  Pain in left arm - Plan: PT plan of care cert/re-cert     Problem List Patient Active Problem List   Diagnosis Date Noted  . Port-A-Cath in place 03/26/2019  . Genetic testing 02/01/2019  . Family history of prostate cancer   . Family history of throat cancer   . Family history of breast cancer   . Malignant neoplasm of upper-outer quadrant of left breast in female, estrogen receptor positive ([place]) 01/18/2019  . Other allergic rhinitis 12/03/2014   Donato Heinz. Owens Shark PT  [place] Levo 08/20/2019, 6:47 PM  [place], [place], [address] Phone: [phone]   Fax:  [phone]  Name: Paige Perry MRN: [id number] Date of Birth: 30-Jan-1976

## 2019-08-21 ENCOUNTER — Ambulatory Visit
Admission: RE | Admit: 2019-08-21 | Discharge: 2019-08-21 | Disposition: A | Discharge status: Home / Self Care | Payer: BC Managed Care – PPO | Source: Ambulatory Visit | Attending: Radiation Oncology | Admitting: Radiation Oncology

## 2019-08-21 ENCOUNTER — Other Ambulatory Visit: Payer: Self-pay

## 2019-08-21 DIAGNOSIS — Z510 Encounter for antineoplastic radiation therapy: Secondary | ICD-10-CM | POA: Diagnosis not present

## 2019-08-22 ENCOUNTER — Other Ambulatory Visit: Payer: Self-pay

## 2019-08-22 ENCOUNTER — Ambulatory Visit
Admission: RE | Admit: 2019-08-22 | Discharge: 2019-08-22 | Disposition: A | Discharge status: Home / Self Care | Payer: BC Managed Care – PPO | Source: Ambulatory Visit | Attending: Radiation Oncology | Admitting: Radiation Oncology

## 2019-08-22 DIAGNOSIS — Z510 Encounter for antineoplastic radiation therapy: Secondary | ICD-10-CM | POA: Diagnosis not present

## 2019-08-22 NOTE — Progress Notes (Signed)
Pt here for patient teaching.  Pt given Radiation and You booklet, skin care instructions, [place]ra deodorant and Sonafine.  Reviewed areas of pertinence such as fatigue, hair loss, skin changes, breast tenderness and breast swelling . Pt able to give teach back of to pat skin and use unscented/gentle soap,apply Sonafine bid, avoid applying anything to skin within 4 hours of treatment, avoid wearing an under wire bra and to use an electric razor if they must shave. Pt verbalizes understanding of information given and will contact nursing with any questions or concerns.     Zakia M. Mariadelcarmen Corella RN, BSN             

## 2019-08-23 ENCOUNTER — Ambulatory Visit
Admission: RE | Admit: 2019-08-23 | Discharge: 2019-08-23 | Disposition: A | Discharge status: Home / Self Care | Payer: BC Managed Care – PPO | Source: Ambulatory Visit | Attending: Radiation Oncology | Admitting: Radiation Oncology

## 2019-08-23 ENCOUNTER — Ambulatory Visit: Payer: BC Managed Care – PPO | Admitting: Rehabilitation

## 2019-08-23 ENCOUNTER — Encounter: Payer: Self-pay | Admitting: Rehabilitation

## 2019-08-23 ENCOUNTER — Other Ambulatory Visit: Payer: Self-pay

## 2019-08-23 DIAGNOSIS — C50412 Malignant neoplasm of upper-outer quadrant of left female breast: Secondary | ICD-10-CM

## 2019-08-23 DIAGNOSIS — Z510 Encounter for antineoplastic radiation therapy: Secondary | ICD-10-CM | POA: Diagnosis not present

## 2019-08-23 DIAGNOSIS — Z483 Aftercare following surgery for neoplasm: Secondary | ICD-10-CM | POA: Diagnosis not present

## 2019-08-23 DIAGNOSIS — M79602 Pain in left arm: Secondary | ICD-10-CM

## 2019-08-23 DIAGNOSIS — R293 Abnormal posture: Secondary | ICD-10-CM

## 2019-08-23 MED ORDER — ALRA NON-METALLIC DEODORANT (RAD-ONC)
1.0000 "application " | Freq: Once | TOPICAL | Status: AC
  Administered 2019-08-23: 1 via TOPICAL

## 2019-08-23 MED ORDER — SONAFINE EX EMUL
1.0000 "application " | Freq: Once | CUTANEOUS | Status: AC
  Administered 2019-08-23: 1 via TOPICAL

## 2019-08-23 NOTE — Therapy (Signed)
[place], [place], [address] Phone: [phone]   Fax:  [phone]  Physical Therapy Treatment  Patient Details  Name: Paige Perry MRN: [id number] Date of Birth: 08-31-1975 Referring Provider (PT): Dr Lindi Adie   Encounter Date: 08/23/2019  PT End of Session - 08/23/19 1146    Visit Number  2    Number of Visits  9    Date for PT Re-Evaluation  10/21/19    PT Start Time  0902    PT Stop Time  0955    PT Time Calculation (min)  53 min    Activity Tolerance  Patient tolerated treatment well    Behavior During Therapy  [facility] for tasks assessed/performed       Past Medical History:  Diagnosis Date  . Asthma   . Cancer [facility])    breast  . Family history of breast cancer   . Family history of breast cancer   . Family history of prostate cancer   . Family history of throat cancer   . Hypercholesteremia   . PONV (postoperative nausea and vomiting)     Past Surgical History:  Procedure Laterality Date  . AXILLARY LYMPH NODE DISSECTION Left 07/11/2019   Procedure: LEFT AXILLARY LYMPH NODE DISSECTION;  Surgeon: Donnie Mesa, MD;  Location: [place];  Service: General;  Laterality: Left;  . BREAST LUMPECTOMY WITH RADIOACTIVE SEED AND AXILLARY LYMPH NODE DISSECTION Left 06/13/2019   Procedure: LEFT BREAST LUMPECTOMY WITH RADIOACTIVE SEED AND AXILLARY TARGETED LYMPH NODE DISSECTION;  Surgeon: Donnie Mesa, MD;  Location: [place];  Service: General;  Laterality: Left;  . FINGER SURGERY    . PORTACATH PLACEMENT Right 02/05/2019   Procedure: INSERTION PORT-A-CATH WITH ULTRASOUND;  Surgeon: Donnie Mesa, MD;  Location: [place];  Service: General;  Laterality: Right;  . WOUND EXPLORATION Right 06/13/2019   Procedure: RIGHT CHEST WOUND EXPLORATION;  Surgeon: Donnie Mesa, MD;  Location: [place];  Service: General;  Laterality: Right;     There were no vitals filed for this visit.  Subjective Assessment - 08/23/19 0901    Subjective  I am doing ok.  The sleeve worked well and decreased some of the pain.    Pertinent History  Patient was diagnosed on 01/14/2019 with left grade III invasive ductal carcinoma breast cancer. It measures 2.3 cm and is located in the upper outer quadrant. It is ER positive, PR negative, and HER2 postive with a Ki67 of 80%. She has a known positive axillary lymph node. She underwent a lumpectomy with sentinal node diseection on 3/18/ 2021 and needs to go back for a full ALND (5 nodes?) on 07/11/2019. She started radiation on 08/19/2019    Currently in Pain?  No/denies   up to 3/10 at night   Pain Location  Arm    Pain Orientation  Left    Pain Descriptors / Indicators  Tightness;Numbness    Pain Type  Surgical pain;Acute pain    Pain Onset  1 to 4 weeks ago    Pain Frequency  Intermittent    Aggravating Factors   sleep, pressing on it    Effect of Pain on Daily Activities  lifting and arm use                        [facility] Adult PT Treatment/Exercise - 08/23/19 0001      Manual Therapy   Manual  Therapy  Edema management;Soft tissue mobilization;Passive ROM;Manual Lymphatic Drainage (MLD)    Manual therapy comments  added a size small tg soft to layer on the Rt arm as pt liked it but it was not tight enough around the hand    Edema Management  gave pt prescription for sleeve and glove and bras to use at [employer] or a special place    Soft tissue mobilization  in supine gently along the medial arm from wrist to axilla using cocoa butter.  Cueing to keep elbow in extension.  Very small cord evident at antecubital fossa but not in the rest of the arm.  More so overall muscular tightness and decreased skin mobility    Manual Lymphatic Drainage (MLD)  PT performed short neck, Rt axillary nodes, lt inguinal nodes, anterior interaxillary pathway, Left axillo inguinal pathway, Left  axillary nodes, then Left arm from proximal to distal and then retracing all steps with education on lymphatic anatomy and system throughout     Passive ROM  of the Left shoulder into flexion, and horizontal abduction including some wrist extension                  PT Long Term Goals - 08/20/19 1837      PT LONG TERM GOAL #1   Title  Pt will report that the pain in her arm is decreased by 50%    Time  8    Period  Weeks    Status  New      PT LONG TERM GOAL #2   Title  Pt will be verbalize lymphedema risk reduction practices    Time  8    Period  Weeks    Status  New      PT LONG TERM GOAL #3   Title  Pt will be verbalize understanding of Strength ABC program to decrease lymphedema risk    Time  8    Period  Weeks    Status  New            Plan - 08/23/19 1147    Clinical Impression Statement  Initiated PT treatment today focusing on MLD and gentle STM to the left UE as well as Left shoulder PROM and stretching to relieve pain and tingling in the Left arm.  Skin is still ok from radiation but avoided any lotion in the radiation field.  Pt had good loosening of the whole arm soft tissue during treatment and reported things felt much better post treatment.    Comorbidities  lumpectomy, ALND, chemotherapy, ongoing radiation    PT Next Visit Plan  Get appt for sleeve/bras?. Teach self UE MLD, Cont manual techniques and stretching to decrease cording. Continue STM/MLD for the left arm, include ROM activities, Can do sozo after 10/10/19    Recommended Other Services  will get sleeve and bras on [address].  Has prescription    Consulted and Agree with Plan of Care  Patient       Patient will benefit from skilled therapeutic intervention in order to improve the following deficits and impairments:  Postural dysfunction, Decreased range of motion, Decreased knowledge of precautions, Pain, Impaired UE functional use, Increased fascial restricitons, Increased edema,  Decreased scar mobility  Visit Diagnosis: Aftercare following surgery for neoplasm  Pain in left arm  Abnormal posture  Malignant neoplasm of upper-outer quadrant of left breast in female, estrogen receptor positive [facility])     Problem List Patient Active Problem List  Diagnosis Date Noted  . Port-A-Cath in place 03/26/2019  . Genetic testing 02/01/2019  . Family history of prostate cancer   . Family history of throat cancer   . Family history of breast cancer   . Malignant neoplasm of upper-outer quadrant of left breast in female, estrogen receptor positive ([place]) 01/18/2019  . Other allergic rhinitis 12/03/2014    Paige Perry 08/23/2019, 11:56 AM  [place], [place], [address] Phone: [phone]   Fax:  [phone]  Name: Paige Perry MRN: [id number] Date of Birth: 05/08/1975

## 2019-08-24 ENCOUNTER — Ambulatory Visit: Payer: BC Managed Care – PPO

## 2019-08-25 ENCOUNTER — Ambulatory Visit: Payer: BC Managed Care – PPO

## 2019-08-27 ENCOUNTER — Ambulatory Visit: Payer: BC Managed Care – PPO | Attending: Surgery

## 2019-08-27 ENCOUNTER — Ambulatory Visit
Admission: RE | Admit: 2019-08-27 | Discharge: 2019-08-27 | Disposition: A | Discharge status: Home / Self Care | Payer: BC Managed Care – PPO | Source: Ambulatory Visit | Attending: Radiation Oncology | Admitting: Radiation Oncology

## 2019-08-27 ENCOUNTER — Other Ambulatory Visit: Payer: Self-pay

## 2019-08-27 DIAGNOSIS — Z170 Estrogen receptor positive status [ER+]: Secondary | ICD-10-CM | POA: Diagnosis present

## 2019-08-27 DIAGNOSIS — C50412 Malignant neoplasm of upper-outer quadrant of left female breast: Secondary | ICD-10-CM | POA: Insufficient documentation

## 2019-08-27 DIAGNOSIS — R293 Abnormal posture: Secondary | ICD-10-CM | POA: Diagnosis present

## 2019-08-27 DIAGNOSIS — M79602 Pain in left arm: Secondary | ICD-10-CM | POA: Diagnosis present

## 2019-08-27 DIAGNOSIS — Z483 Aftercare following surgery for neoplasm: Secondary | ICD-10-CM

## 2019-08-27 DIAGNOSIS — Z510 Encounter for antineoplastic radiation therapy: Secondary | ICD-10-CM | POA: Insufficient documentation

## 2019-08-27 NOTE — Patient Instructions (Signed)
Start with circles near neck with hands above collarbones. 10 times      Cancer Rehab [phone] Deep Effective Breath   Standing, sitting, or laying down, place both hands on the belly. Take a deep breath IN, expanding the belly; then breath OUT, contracting the belly. Repeat __5__ times. Do __2-3__ sessions per day and before your self massage.  Axilla to Axilla - Sweep   On uninvolved side make 5 circles in the armpit, then pump _5__ times from involved armpit across chest to uninvolved armpit, making a pathway. Do _1__ time per day.  Copyright  VHI. All rights reserved.  Axilla to Inguinal Nodes - Sweep   On involved side, make 5 circles at groin at panty line, then pump _5__ times from armpit along side of trunk to outer hip, making your other pathway. Do __1_ time per day.  Copyright  VHI. All rights reserved.  Arm Posterior: Elbow to Shoulder - Sweep   Pump _5__ times from back of elbow to top of shoulder. Then inner to outer upper arm _5_ times, then outer arm again _5_ times. Then back to the pathways _2-3_ times. Do _1__ time per day.  Copyright  VHI. All rights reserved.  ARM: [place] Wrist to Elbow - Sweep   Pump or stationary circles _5__ times from wrist to elbow making sure to do both sides of the forearm. Then retrace your steps to the outer arm, and the pathways _2-3_ times each. Do _1__ time per day.  Copyright  VHI. All rights reserved.  ARM: Dorsum of Hand to Shoulder - Sweep   Pump or stationary circles _5__ times on back of hand including knuckle spaces and individual fingers if needed working up towards the wrist, then retrace all your steps working back up the forearm, doing both sides; upper outer arm and back to your pathways _2-3_ times each. Then do 5 circles again at uninvolved armpit and involved groin where you started! Good job!! Do __1_ time per day

## 2019-08-27 NOTE — Therapy (Signed)
[place], [place], [address] Phone: [phone]   Fax:  [phone]  Physical Therapy Treatment  Patient Details  Name: Paige Perry MRN: [id number] Date of Birth: 1975-04-27 Referring Provider (PT): Dr Lindi Adie   Encounter Date: 08/27/2019  PT End of Session - 08/27/19 1023    Visit Number  3    Number of Visits  9    Date for PT Re-Evaluation  10/21/19    PT Start Time  0902    PT Stop Time  1005    PT Time Calculation (min)  63 min    Activity Tolerance  Patient tolerated treatment well    Behavior During Therapy  [facility] for tasks assessed/performed       Past Medical History:  Diagnosis Date  . Asthma   . Cancer [facility])    breast  . Family history of breast cancer   . Family history of breast cancer   . Family history of prostate cancer   . Family history of throat cancer   . Hypercholesteremia   . PONV (postoperative nausea and vomiting)     Past Surgical History:  Procedure Laterality Date  . AXILLARY LYMPH NODE DISSECTION Left 07/11/2019   Procedure: LEFT AXILLARY LYMPH NODE DISSECTION;  Surgeon: Donnie Mesa, MD;  Location: [place];  Service: General;  Laterality: Left;  . BREAST LUMPECTOMY WITH RADIOACTIVE SEED AND AXILLARY LYMPH NODE DISSECTION Left 06/13/2019   Procedure: LEFT BREAST LUMPECTOMY WITH RADIOACTIVE SEED AND AXILLARY TARGETED LYMPH NODE DISSECTION;  Surgeon: Donnie Mesa, MD;  Location: [place];  Service: General;  Laterality: Left;  . FINGER SURGERY    . PORTACATH PLACEMENT Right 02/05/2019   Procedure: INSERTION PORT-A-CATH WITH ULTRASOUND;  Surgeon: Donnie Mesa, MD;  Location: [place];  Service: General;  Laterality: Right;  . WOUND EXPLORATION Right 06/13/2019   Procedure: RIGHT CHEST WOUND EXPLORATION;  Surgeon: Donnie Mesa, MD;  Location: [place];  Service: General;  Laterality: Right;     There were no vitals filed for this visit.  Subjective Assessment - 08/27/19 0904    Subjective  I've been stretching at home with the exercises and those are helping. I felt good after last session until the next day when the weather turned cooler. Today my arm is just a little achy, no pain though. I haven't been able to make an appt yet for compression garments due to the holiday, but I plan on doing that soon.    Pertinent History  Patient was diagnosed on 01/14/2019 with left grade III invasive ductal carcinoma breast cancer. It measures 2.3 cm and is located in the upper outer quadrant. It is ER positive, PR negative, and HER2 postive with a Ki67 of 80%. She has a known positive axillary lymph node. She underwent a lumpectomy with sentinal node diseection on 3/18/ 2021 and needs to go back for a full ALND (5 nodes?) on 07/11/2019. She started radiation on 08/19/2019    Patient Stated Goals  to get rid of the pain    Currently in Pain?  No/denies                        [facility] Adult PT Treatment/Exercise - 08/27/19 0001      Manual Therapy   Manual Therapy  Myofascial release;Soft tissue mobilization;Manual Lymphatic Drainage (MLD);Passive ROM    Soft tissue mobilization  Along antecubital fossa but no  cording visible or palpable today and pt reports not feeling tightness here anymore either    Myofascial Release  To Lt axilla during P/ROM as tolerated    Manual Lymphatic Drainage (MLD)  PTA performed In Supine: Short neck, 5 diaphragmatic breaths, Rt axillary nodes, Lt inguinal nodes, anterior interaxillary pathway, Left axillo inguinal pathway, then Left arm from proximal to distal and then retracing all steps and instructing pt throughout and having her return demo    Passive ROM  of the Left shoulder into flexion, abduction and D2 as tolerated. Pt struggled with muscle guarding initially but this did improve well by end of session             PT Education -  08/27/19 1037    Education Details  Self manual lymph drainage    Person(s) Educated  Patient    Methods  Explanation;Demonstration;Verbal cues;Handout    Comprehension  Verbalized understanding;Returned demonstration;Tactile cues required;Need further instruction          PT Long Term Goals - 08/20/19 1837      PT LONG TERM GOAL #1   Title  Pt will report that the pain in her arm is decreased by 50%    Time  8    Period  Weeks    Status  New      PT LONG TERM GOAL #2   Title  Pt will be verbalize lymphedema risk reduction practices    Time  8    Period  Weeks    Status  New      PT LONG TERM GOAL #3   Title  Pt will be verbalize understanding of Strength ABC program to decrease lymphedema risk    Time  8    Period  Weeks    Status  New            Plan - 08/27/19 1024    Clinical Impression Statement  Continued with manual therapy focusing more time on manual lymph drainage so as to instruct pt in same today. Reviewed briefly anatomy of lymphatic system and then principles of MLD while performing. Then had pt return demo at each step using VCs and hand over hand pressure to instruct in correct light pressure and skin stretch only, no sliding on skin. She was able to return good demo by end of instruction and able to verbalize fairly good understanding of general basics of principles. Handout also issued for instruction. Then continued with P/ROM of Lt shoulder with MFR to Lt axilla as tolerated by pt. She struggles with muscle guarding so unable to fully relax initally but this did improve well by end of session as did her end P/ROM. Pt repors her Lt upper quadrant feeling much looser again in general by end of session. She has yet to schedule an appt to be measured as it was a holiday weekend so plans on doing this soon for compression bra and sleeve/gauntlet. No cording noticed at antecubital fossa today and pt reports less tightness.    Personal Factors and Comorbidities   Comorbidity 3+    Comorbidities  lumpectomy, ALND, chemotherapy, ongoing radiation    Examination-Activity Limitations  Reach Overhead;Sleep;Lift    Rehab Potential  Good    PT Frequency  2x / week    PT Duration  8 weeks   PT likely on hold during radiation   PT Treatment/Interventions  ADLs/Self Care Home Management;Therapeutic exercise;Patient/family education;Orthotic Fit/Training;Manual lymph drainage;Compression bandaging;Manual techniques;Taping    PT Next Visit  Plan  Get appt for sleeve/bras?. Review self UE MLD and cont same, Cont manual techniques and stretching to decrease cording. Continue STM/MLD for the left arm, include ROM activities, Can do sozo after 10/10/19    PT Home Exercise Plan  Self MLD daily    Consulted and Agree with Plan of Care  Patient       Patient will benefit from skilled therapeutic intervention in order to improve the following deficits and impairments:  Postural dysfunction, Decreased range of motion, Decreased knowledge of precautions, Pain, Impaired UE functional use, Increased fascial restricitons, Increased edema, Decreased scar mobility  Visit Diagnosis: Aftercare following surgery for neoplasm  Pain in left arm  Abnormal posture  Malignant neoplasm of upper-outer quadrant of left breast in female, estrogen receptor positive ([place])     Problem List Patient Active Problem List   Diagnosis Date Noted  . Port-A-Cath in place 03/26/2019  . Genetic testing 02/01/2019  . Family history of prostate cancer   . Family history of throat cancer   . Family history of breast cancer   . Malignant neoplasm of upper-outer quadrant of left breast in female, estrogen receptor positive ([place]) 01/18/2019  . Other allergic rhinitis 12/03/2014    Otelia Limes, PTA 08/27/2019, 10:38 AM  [place], [place], [address] Phone: [phone]   Fax:  [phone]  Name:  VICENTA OLDS MRN: [id number] Date of Birth: [birth year]

## 2019-08-28 ENCOUNTER — Ambulatory Visit
Admission: RE | Admit: 2019-08-28 | Discharge: 2019-08-28 | Disposition: A | Discharge status: Home / Self Care | Payer: BC Managed Care – PPO | Source: Ambulatory Visit | Attending: Radiation Oncology | Admitting: Radiation Oncology

## 2019-08-28 ENCOUNTER — Other Ambulatory Visit: Payer: Self-pay

## 2019-08-28 DIAGNOSIS — Z510 Encounter for antineoplastic radiation therapy: Secondary | ICD-10-CM | POA: Diagnosis not present

## 2019-08-29 ENCOUNTER — Other Ambulatory Visit: Payer: Self-pay

## 2019-08-29 ENCOUNTER — Ambulatory Visit
Admission: RE | Admit: 2019-08-29 | Discharge: 2019-08-29 | Disposition: A | Discharge status: Home / Self Care | Payer: BC Managed Care – PPO | Source: Ambulatory Visit | Attending: Radiation Oncology | Admitting: Radiation Oncology

## 2019-08-29 DIAGNOSIS — Z510 Encounter for antineoplastic radiation therapy: Secondary | ICD-10-CM | POA: Diagnosis not present

## 2019-08-30 ENCOUNTER — Ambulatory Visit: Payer: BC Managed Care – PPO

## 2019-08-31 ENCOUNTER — Ambulatory Visit: Payer: BC Managed Care – PPO

## 2019-09-01 ENCOUNTER — Ambulatory Visit: Payer: BC Managed Care – PPO

## 2019-09-02 ENCOUNTER — Other Ambulatory Visit: Payer: Self-pay

## 2019-09-02 ENCOUNTER — Ambulatory Visit
Admission: RE | Admit: 2019-09-02 | Discharge: 2019-09-02 | Disposition: A | Discharge status: Home / Self Care | Payer: BC Managed Care – PPO | Source: Ambulatory Visit | Attending: Radiation Oncology | Admitting: Radiation Oncology

## 2019-09-02 DIAGNOSIS — Z510 Encounter for antineoplastic radiation therapy: Secondary | ICD-10-CM | POA: Diagnosis not present

## 2019-09-03 ENCOUNTER — Other Ambulatory Visit: Payer: Self-pay

## 2019-09-03 ENCOUNTER — Ambulatory Visit
Admission: RE | Admit: 2019-09-03 | Discharge: 2019-09-03 | Disposition: A | Discharge status: Home / Self Care | Payer: BC Managed Care – PPO | Source: Ambulatory Visit | Attending: Radiation Oncology | Admitting: Radiation Oncology

## 2019-09-03 DIAGNOSIS — Z510 Encounter for antineoplastic radiation therapy: Secondary | ICD-10-CM | POA: Diagnosis not present

## 2019-09-04 ENCOUNTER — Ambulatory Visit
Admission: RE | Admit: 2019-09-04 | Discharge: 2019-09-04 | Disposition: A | Discharge status: Home / Self Care | Payer: BC Managed Care – PPO | Source: Ambulatory Visit | Attending: Radiation Oncology | Admitting: Radiation Oncology

## 2019-09-04 ENCOUNTER — Encounter: Payer: Self-pay | Admitting: Rehabilitation

## 2019-09-04 ENCOUNTER — Other Ambulatory Visit: Payer: Self-pay

## 2019-09-04 ENCOUNTER — Ambulatory Visit: Payer: BC Managed Care – PPO | Admitting: Rehabilitation

## 2019-09-04 DIAGNOSIS — Z170 Estrogen receptor positive status [ER+]: Secondary | ICD-10-CM

## 2019-09-04 DIAGNOSIS — Z483 Aftercare following surgery for neoplasm: Secondary | ICD-10-CM

## 2019-09-04 DIAGNOSIS — Z510 Encounter for antineoplastic radiation therapy: Secondary | ICD-10-CM | POA: Diagnosis not present

## 2019-09-04 DIAGNOSIS — R293 Abnormal posture: Secondary | ICD-10-CM

## 2019-09-04 DIAGNOSIS — M79602 Pain in left arm: Secondary | ICD-10-CM

## 2019-09-04 NOTE — Therapy (Addendum)
[place], [place], [address] Phone: [phone]   Fax:  [phone]  Physical Therapy Treatment  Patient Details  Name: Paige Perry MRN: [id number] Date of Birth: [birth year] Referring Provider (PT): Dr Lindi Adie   Encounter Date: 09/04/2019  PT End of Session - 09/04/19 1400    Visit Number  4    Number of Visits  9    Date for PT Re-Evaluation  10/21/19    PT Start Time  1108    PT Stop Time  1153    PT Time Calculation (min)  45 min    Activity Tolerance  Patient tolerated treatment well    Behavior During Therapy  [facility] for tasks assessed/performed       Past Medical History:  Diagnosis Date  . Asthma   . Cancer [facility])    breast  . Family history of breast cancer   . Family history of breast cancer   . Family history of prostate cancer   . Family history of throat cancer   . Hypercholesteremia   . PONV (postoperative nausea and vomiting)     Past Surgical History:  Procedure Laterality Date  . AXILLARY LYMPH NODE DISSECTION Left 07/11/2019   Procedure: LEFT AXILLARY LYMPH NODE DISSECTION;  Surgeon: Donnie Mesa, MD;  Location: [place];  Service: General;  Laterality: Left;  . BREAST LUMPECTOMY WITH RADIOACTIVE SEED AND AXILLARY LYMPH NODE DISSECTION Left 06/13/2019   Procedure: LEFT BREAST LUMPECTOMY WITH RADIOACTIVE SEED AND AXILLARY TARGETED LYMPH NODE DISSECTION;  Surgeon: Donnie Mesa, MD;  Location: [place];  Service: General;  Laterality: Left;  . FINGER SURGERY    . PORTACATH PLACEMENT Right 02/05/2019   Procedure: INSERTION PORT-A-CATH WITH ULTRASOUND;  Surgeon: Donnie Mesa, MD;  Location: [place];  Service: General;  Laterality: Right;  . WOUND EXPLORATION Right 06/13/2019   Procedure: RIGHT CHEST WOUND EXPLORATION;  Surgeon: Donnie Mesa, MD;  Location: [place];  Service: General;  Laterality: Right;     There were no vitals filed for this visit.  Subjective Assessment - 09/04/19 1108    Subjective  It is definitely better.  I only have pain right after radiation. The sleeve also helps alot.  I was able to get 6 gauntlets.    Pertinent History  Patient was diagnosed on 01/14/2019 with left grade III invasive ductal carcinoma breast cancer. It measures 2.3 cm and is located in the upper outer quadrant. It is ER positive, PR negative, and HER2 postive with a Ki67 of 80%. She has a known positive axillary lymph node. She underwent a lumpectomy with sentinal node diseection on 3/18/ 2021 and needs to go back for a full ALND (5 nodes?) on 07/11/2019. She started radiation on 08/19/2019    Patient Stated Goals  to get rid of the pain    Currently in Pain?  No/denies                        [facility] Adult PT Treatment/Exercise - 09/04/19 0001      Manual Therapy   Manual Lymphatic Drainage (MLD)  PT performed In Supine: Short neck, 5 diaphragmatic breaths, Rt axillary nodes, Lt inguinal nodes, anterior interaxillary pathway, Left axillo inguinal pathway, then Left arm from proximal to distal and then retracing all steps and instructing pt throughout and having her return demo.  Pt with good understanding of technique    Passive  ROM  to the Lt shoulder into flexion, abduction, and ER withoutmuch guarding today             PT Education - 09/04/19 1400    Education Details  final HEP, garment use, garment care, donning and doffing, when to check back in    Person(s) Educated  Patient    Methods  Explanation;Demonstration;Verbal cues;Tactile cues;Handout    Comprehension  Verbalized understanding;Returned demonstration          PT Long Term Goals - 09/04/19 1637      PT LONG TERM GOAL #1   Title  Pt will report that the pain in her arm is decreased by 50%    Status  Achieved      PT LONG TERM GOAL #2   Title  Pt will be verbalize lymphedema risk reduction practices     Status  Achieved      PT LONG TERM GOAL #3   Title  Pt will be verbalize understanding of Strength ABC program to decrease lymphedema risk    Status  Deferred            Plan - 09/04/19 1401    Clinical Impression Statement  Pt arrives reporting that she really has not had the arm or breast pain lately and it is only immediately after radiation lasting for just a few minutes.  Pt is happy with her current status and would like today to be the last visit.  Pt is now ind with self MLD, will be receiving her compression garments soon, and is aware of remedial and stretching exercises for home.  Pt was educated on garment use, care, and is aware when to return for SOZO or future needs.    PT Treatment/Interventions  ADLs/Self Care Home Management;Therapeutic exercise;Patient/family education;Orthotic Fit/Training;Manual lymph drainage;Compression bandaging;Manual techniques;Taping    PT Next Visit Plan  SOZO    PT Home Exercise Plan  Self MLD daily, remedial exercises    Consulted and Agree with Plan of Care  Patient       Patient will benefit from skilled therapeutic intervention in order to improve the following deficits and impairments:     Visit Diagnosis: Aftercare following surgery for neoplasm  Pain in left arm  Abnormal posture  Malignant neoplasm of upper-outer quadrant of left breast in female, estrogen receptor positive ([place])     Problem List Patient Active Problem List   Diagnosis Date Noted  . Port-A-Cath in place 03/26/2019  . Genetic testing 02/01/2019  . Family history of prostate cancer   . Family history of throat cancer   . Family history of breast cancer   . Malignant neoplasm of upper-outer quadrant of left breast in female, estrogen receptor positive ([place]) 01/18/2019  . Other allergic rhinitis 12/03/2014   Shan Levans, PT  09/04/2019, 4:37 PM  [place], [place],  [address] Phone: [phone]   Fax:  [phone]  Name: DOMONIC HISCOX MRN: [id number] Date of Birth: [birth year]  PHYSICAL THERAPY DISCHARGE SUMMARY  Visits from Start of Care: 4  Current functional level related to goals / functional outcomes: See above.  Pt is happy with no pain remaining and current status   Remaining deficits: none   Education / Equipment: HEP Plan: Patient agrees to discharge.  Patient goals were met. Patient is being discharged due to being pleased with the current functional level.  ?????    Shan Levans, PT

## 2019-09-04 NOTE — Patient Instructions (Signed)
Access Code: [email or web address]: [email or web address]: 06/09/2021Prepared by: Marcene Brawn TevisExercises  Sawing with Compression Garment - 2 x daily - 7 x weekly - 15 reps - 1 sets  Trunk Sidebending with Compression Garment - 2 x daily - 7 x weekly - 15 reps - 1 sets  Scapular Retraction with Compression Garment - 2 x daily - 7 x weekly - 15 reps - 1 sets  Bicep Curls with Compression Garment - 2 x daily - 7 x weekly - 15 reps - 1 sets  Punch Up with Compression Garment - 2 x daily - 7 x weekly - 15 reps - 1 sets  Seated Shoulder Circles with Compression Garment - 2 x daily - 7 x weekly - 15 reps - 1 sets  Wrist Flexion and Extension with Compression Garment - 2 x daily - 7 x weekly - 15 reps - 1 sets  Standing 'L' Stretch at Counter - 1-2 x daily - 7 x weekly - 1 sets - 3 reps - 20-30 seconds hold  Seated Lateral Trunk Stretch on Swiss Ball - 1-2 x daily - 7 x weekly - 1 sets - 3 reps - 20-30 seconds hold Patient Education  Lymphedema: Exercise Considerations

## 2019-09-05 ENCOUNTER — Other Ambulatory Visit: Payer: Self-pay

## 2019-09-05 ENCOUNTER — Ambulatory Visit
Admission: RE | Admit: 2019-09-05 | Discharge: 2019-09-05 | Disposition: A | Discharge status: Home / Self Care | Payer: BC Managed Care – PPO | Source: Ambulatory Visit | Attending: Radiation Oncology | Admitting: Radiation Oncology

## 2019-09-05 DIAGNOSIS — Z510 Encounter for antineoplastic radiation therapy: Secondary | ICD-10-CM | POA: Diagnosis not present

## 2019-09-06 ENCOUNTER — Other Ambulatory Visit: Payer: Self-pay

## 2019-09-06 ENCOUNTER — Ambulatory Visit
Admission: RE | Admit: 2019-09-06 | Discharge: 2019-09-06 | Disposition: A | Discharge status: Home / Self Care | Payer: BC Managed Care – PPO | Source: Ambulatory Visit | Attending: Radiation Oncology | Admitting: Radiation Oncology

## 2019-09-06 DIAGNOSIS — Z510 Encounter for antineoplastic radiation therapy: Secondary | ICD-10-CM | POA: Diagnosis not present

## 2019-09-07 ENCOUNTER — Ambulatory Visit: Payer: BC Managed Care – PPO

## 2019-09-08 ENCOUNTER — Ambulatory Visit: Payer: BC Managed Care – PPO

## 2019-09-09 ENCOUNTER — Other Ambulatory Visit: Payer: Self-pay

## 2019-09-09 ENCOUNTER — Ambulatory Visit
Admission: RE | Admit: 2019-09-09 | Discharge: 2019-09-09 | Disposition: A | Discharge status: Home / Self Care | Payer: BC Managed Care – PPO | Source: Ambulatory Visit | Attending: Radiation Oncology | Admitting: Radiation Oncology

## 2019-09-09 DIAGNOSIS — Z510 Encounter for antineoplastic radiation therapy: Secondary | ICD-10-CM | POA: Diagnosis not present

## 2019-09-10 ENCOUNTER — Ambulatory Visit
Admission: RE | Admit: 2019-09-10 | Discharge: 2019-09-10 | Disposition: A | Discharge status: Home / Self Care | Payer: BC Managed Care – PPO | Source: Ambulatory Visit | Attending: Radiation Oncology | Admitting: Radiation Oncology

## 2019-09-10 ENCOUNTER — Other Ambulatory Visit: Payer: Self-pay

## 2019-09-10 DIAGNOSIS — Z510 Encounter for antineoplastic radiation therapy: Secondary | ICD-10-CM | POA: Diagnosis not present

## 2019-09-11 ENCOUNTER — Ambulatory Visit
Admission: RE | Admit: 2019-09-11 | Discharge: 2019-09-11 | Disposition: A | Discharge status: Home / Self Care | Payer: BC Managed Care – PPO | Source: Ambulatory Visit | Attending: Radiation Oncology | Admitting: Radiation Oncology

## 2019-09-11 ENCOUNTER — Other Ambulatory Visit: Payer: Self-pay

## 2019-09-11 DIAGNOSIS — Z510 Encounter for antineoplastic radiation therapy: Secondary | ICD-10-CM | POA: Diagnosis not present

## 2019-09-12 ENCOUNTER — Other Ambulatory Visit: Payer: Self-pay

## 2019-09-12 ENCOUNTER — Ambulatory Visit
Admission: RE | Admit: 2019-09-12 | Discharge: 2019-09-12 | Disposition: A | Discharge status: Home / Self Care | Payer: BC Managed Care – PPO | Source: Ambulatory Visit | Attending: Radiation Oncology | Admitting: Radiation Oncology

## 2019-09-12 DIAGNOSIS — Z510 Encounter for antineoplastic radiation therapy: Secondary | ICD-10-CM | POA: Diagnosis not present

## 2019-09-13 ENCOUNTER — Ambulatory Visit
Admission: RE | Admit: 2019-09-13 | Discharge: 2019-09-13 | Disposition: A | Discharge status: Home / Self Care | Payer: BC Managed Care – PPO | Source: Ambulatory Visit | Attending: Radiation Oncology | Admitting: Radiation Oncology

## 2019-09-13 ENCOUNTER — Other Ambulatory Visit: Payer: Self-pay

## 2019-09-13 DIAGNOSIS — Z510 Encounter for antineoplastic radiation therapy: Secondary | ICD-10-CM | POA: Diagnosis not present

## 2019-09-15 ENCOUNTER — Ambulatory Visit: Payer: BC Managed Care – PPO

## 2019-09-16 ENCOUNTER — Ambulatory Visit
Admission: RE | Admit: 2019-09-16 | Discharge: 2019-09-16 | Disposition: A | Discharge status: Home / Self Care | Payer: BC Managed Care – PPO | Source: Ambulatory Visit | Attending: Radiation Oncology | Admitting: Radiation Oncology

## 2019-09-16 ENCOUNTER — Other Ambulatory Visit: Payer: Self-pay

## 2019-09-16 DIAGNOSIS — Z510 Encounter for antineoplastic radiation therapy: Secondary | ICD-10-CM | POA: Diagnosis not present

## 2019-09-17 ENCOUNTER — Ambulatory Visit
Admission: RE | Admit: 2019-09-17 | Discharge: 2019-09-17 | Disposition: A | Discharge status: Home / Self Care | Payer: BC Managed Care – PPO | Source: Ambulatory Visit | Attending: Radiation Oncology | Admitting: Radiation Oncology

## 2019-09-17 ENCOUNTER — Other Ambulatory Visit: Payer: Self-pay

## 2019-09-17 DIAGNOSIS — Z510 Encounter for antineoplastic radiation therapy: Secondary | ICD-10-CM | POA: Diagnosis not present

## 2019-09-18 ENCOUNTER — Other Ambulatory Visit: Payer: Self-pay

## 2019-09-18 ENCOUNTER — Ambulatory Visit: Payer: BC Managed Care – PPO

## 2019-09-18 ENCOUNTER — Ambulatory Visit
Admission: RE | Admit: 2019-09-18 | Discharge: 2019-09-18 | Disposition: A | Discharge status: Home / Self Care | Payer: BC Managed Care – PPO | Source: Ambulatory Visit | Attending: Radiation Oncology | Admitting: Radiation Oncology

## 2019-09-18 DIAGNOSIS — Z510 Encounter for antineoplastic radiation therapy: Secondary | ICD-10-CM | POA: Diagnosis not present

## 2019-09-19 ENCOUNTER — Ambulatory Visit
Admission: RE | Admit: 2019-09-19 | Discharge: 2019-09-19 | Disposition: A | Discharge status: Home / Self Care | Payer: BC Managed Care – PPO | Source: Ambulatory Visit | Attending: Radiation Oncology | Admitting: Radiation Oncology

## 2019-09-19 ENCOUNTER — Other Ambulatory Visit: Payer: Self-pay

## 2019-09-19 DIAGNOSIS — Z510 Encounter for antineoplastic radiation therapy: Secondary | ICD-10-CM | POA: Diagnosis not present

## 2019-09-20 ENCOUNTER — Other Ambulatory Visit: Payer: Self-pay

## 2019-09-20 ENCOUNTER — Ambulatory Visit
Admission: RE | Admit: 2019-09-20 | Discharge: 2019-09-20 | Disposition: A | Discharge status: Home / Self Care | Payer: BC Managed Care – PPO | Source: Ambulatory Visit | Attending: Radiation Oncology | Admitting: Radiation Oncology

## 2019-09-20 DIAGNOSIS — Z510 Encounter for antineoplastic radiation therapy: Secondary | ICD-10-CM | POA: Diagnosis not present

## 2019-09-21 ENCOUNTER — Ambulatory Visit: Payer: BC Managed Care – PPO

## 2019-09-22 ENCOUNTER — Ambulatory Visit: Payer: BC Managed Care – PPO

## 2019-09-23 ENCOUNTER — Ambulatory Visit
Admission: RE | Admit: 2019-09-23 | Discharge: 2019-09-23 | Disposition: A | Discharge status: Home / Self Care | Payer: BC Managed Care – PPO | Source: Ambulatory Visit | Attending: Radiation Oncology | Admitting: Radiation Oncology

## 2019-09-23 ENCOUNTER — Other Ambulatory Visit: Payer: Self-pay

## 2019-09-23 DIAGNOSIS — Z510 Encounter for antineoplastic radiation therapy: Secondary | ICD-10-CM | POA: Diagnosis not present

## 2019-09-24 ENCOUNTER — Ambulatory Visit: Payer: BC Managed Care – PPO

## 2019-09-24 ENCOUNTER — Other Ambulatory Visit: Payer: Self-pay

## 2019-09-24 ENCOUNTER — Ambulatory Visit
Admission: RE | Admit: 2019-09-24 | Discharge: 2019-09-24 | Disposition: A | Discharge status: Home / Self Care | Payer: BC Managed Care – PPO | Source: Ambulatory Visit | Attending: Radiation Oncology | Admitting: Radiation Oncology

## 2019-09-24 DIAGNOSIS — Z510 Encounter for antineoplastic radiation therapy: Secondary | ICD-10-CM | POA: Diagnosis not present

## 2019-09-25 ENCOUNTER — Ambulatory Visit: Payer: BC Managed Care – PPO

## 2019-09-25 ENCOUNTER — Ambulatory Visit
Admission: RE | Admit: 2019-09-25 | Discharge: 2019-09-25 | Disposition: A | Discharge status: Home / Self Care | Payer: BC Managed Care – PPO | Source: Ambulatory Visit | Attending: Radiation Oncology | Admitting: Radiation Oncology

## 2019-09-25 ENCOUNTER — Other Ambulatory Visit: Payer: Self-pay

## 2019-09-25 DIAGNOSIS — Z510 Encounter for antineoplastic radiation therapy: Secondary | ICD-10-CM | POA: Diagnosis not present

## 2019-09-26 ENCOUNTER — Other Ambulatory Visit: Payer: Self-pay

## 2019-09-26 ENCOUNTER — Ambulatory Visit
Admission: RE | Admit: 2019-09-26 | Discharge: 2019-09-26 | Disposition: A | Discharge status: Home / Self Care | Payer: BC Managed Care – PPO | Source: Ambulatory Visit | Attending: Radiation Oncology | Admitting: Radiation Oncology

## 2019-09-26 ENCOUNTER — Ambulatory Visit: Payer: BC Managed Care – PPO

## 2019-09-26 DIAGNOSIS — C50412 Malignant neoplasm of upper-outer quadrant of left female breast: Secondary | ICD-10-CM | POA: Insufficient documentation

## 2019-09-26 DIAGNOSIS — Z170 Estrogen receptor positive status [ER+]: Secondary | ICD-10-CM | POA: Diagnosis present

## 2019-09-26 DIAGNOSIS — Z510 Encounter for antineoplastic radiation therapy: Secondary | ICD-10-CM | POA: Insufficient documentation

## 2019-09-27 ENCOUNTER — Ambulatory Visit
Admission: RE | Admit: 2019-09-27 | Discharge: 2019-09-27 | Disposition: A | Discharge status: Home / Self Care | Payer: BC Managed Care – PPO | Source: Ambulatory Visit | Attending: Radiation Oncology | Admitting: Radiation Oncology

## 2019-09-27 ENCOUNTER — Ambulatory Visit: Payer: BC Managed Care – PPO | Admitting: Radiation Oncology

## 2019-09-27 ENCOUNTER — Other Ambulatory Visit: Payer: Self-pay

## 2019-09-27 ENCOUNTER — Ambulatory Visit: Payer: BC Managed Care – PPO

## 2019-09-27 DIAGNOSIS — Z510 Encounter for antineoplastic radiation therapy: Secondary | ICD-10-CM | POA: Diagnosis not present

## 2019-09-29 ENCOUNTER — Other Ambulatory Visit: Payer: Self-pay | Admitting: Hematology and Oncology

## 2019-09-29 ENCOUNTER — Ambulatory Visit: Payer: BC Managed Care – PPO

## 2019-10-01 ENCOUNTER — Ambulatory Visit: Payer: BC Managed Care – PPO

## 2019-10-01 ENCOUNTER — Ambulatory Visit
Admission: RE | Admit: 2019-10-01 | Discharge: 2019-10-01 | Disposition: A | Discharge status: Home / Self Care | Payer: BC Managed Care – PPO | Source: Ambulatory Visit | Attending: Radiation Oncology | Admitting: Radiation Oncology

## 2019-10-01 ENCOUNTER — Other Ambulatory Visit: Payer: Self-pay

## 2019-10-01 DIAGNOSIS — Z510 Encounter for antineoplastic radiation therapy: Secondary | ICD-10-CM | POA: Diagnosis not present

## 2019-10-01 NOTE — Progress Notes (Signed)
Patient Care Team: [name], NP as PCP - General Mauro Kaufmann, RN as Oncology Nurse Navigator Rockwell Germany, RN as Oncology Nurse Navigator Donnie Mesa, MD as Consulting Physician (General Surgery) Nicholas Lose, MD as Consulting Physician (Hematology and Oncology) Kyung Rudd, MD as Consulting Physician (Radiation Oncology)  DIAGNOSIS:    ICD-10-CM   1. Malignant neoplasm of upper-outer quadrant of left breast in female, estrogen receptor positive ([place])  C50.412    Z17.0     SUMMARY OF ONCOLOGIC HISTORY: Oncology History  Malignant neoplasm of upper-outer quadrant of left breast in female, estrogen receptor positive ([place])  01/18/2019 Initial Diagnosis   Patient palpated a left breast lump for 2 weeks. Mammogram showed a 2.3cm left breast mass at the 12 o'clock position, with 1 enlarged left axillary lymph node measuring 3.5cm. 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   01/23/2019 Cancer Staging   Staging form: Breast, AJCC 8th Edition - Clinical stage from 01/23/2019: Stage IIA (cT2, cN1(f), cM0, G2, ER+, PR-, HER2+) - Signed by Nicholas Lose, MD on 01/23/2019   02/13/2019 -  Chemotherapy   The patient had palonosetron (ALOXI) injection 0.25 mg, 0.25 mg, Intravenous,  Once, 6 of 6 cycles Administration: 0.25 mg (02/13/2019), 0.25 mg (03/04/2019), 0.25 mg (05/07/2019), 0.25 mg (05/28/2019), 0.25 mg (03/26/2019), 0.25 mg (04/18/2019) pegfilgrastim-jmdb (FULPHILA) injection 6 mg, 6 mg, Subcutaneous,  Once, 6 of 6 cycles Administration: 6 mg (02/15/2019), 6 mg (03/06/2019), 6 mg (05/09/2019), 6 mg (05/30/2019), 6 mg (03/28/2019), 6 mg (04/20/2019) CARBOplatin (PARAPLATIN) 700 mg in sodium chloride 0.9 % 250 mL chemo infusion, 700 mg (100 % of original dose 700 mg), Intravenous,  Once, 6 of 6 cycles Dose modification: 700 mg (original dose 700 mg, Cycle 1) Administration: 700 mg (02/13/2019), 700 mg (03/04/2019), 700 mg (05/07/2019), 700 mg  (05/28/2019), 700 mg (03/26/2019), 700 mg (04/18/2019) DOCEtaxel (TAXOTERE) 140 mg in sodium chloride 0.9 % 250 mL chemo infusion, 75 mg/m2 = 140 mg, Intravenous,  Once, 6 of 6 cycles Dose modification: 65 mg/m2 (original dose 75 mg/m2, Cycle 5, Reason: Dose not tolerated) Administration: 140 mg (02/13/2019), 140 mg (03/04/2019), 120 mg (05/07/2019), 120 mg (05/28/2019), 140 mg (03/26/2019), 140 mg (04/18/2019) pertuzumab (PERJETA) 420 mg in sodium chloride 0.9 % 250 mL chemo infusion, 420 mg (100 % of original dose 420 mg), Intravenous, Once, 3 of 3 cycles Dose modification: 420 mg (original dose 420 mg, Cycle 1, Reason: Provider Judgment) Administration: 420 mg (02/13/2019), 420 mg (03/04/2019), 420 mg (03/26/2019) fosaprepitant (EMEND) 150 mg, dexamethasone (DECADRON) 12 mg in sodium chloride 0.9 % 145 mL IVPB, , Intravenous,  Once, 6 of 6 cycles Administration:  (02/13/2019),  (03/04/2019),  (05/07/2019),  (05/28/2019),  (03/26/2019),  (04/18/2019) trastuzumab-dkst (OGIVRI) 651 mg in sodium chloride 0.9 % 250 mL chemo infusion, 8 mg/kg = 651 mg, Intravenous,  Once, 6 of 6 cycles Administration: 651 mg (02/13/2019), 483 mg (03/04/2019), 483 mg (05/07/2019), 483 mg (05/28/2019), 483 mg (03/26/2019), 483 mg (04/18/2019)  for chemotherapy treatment.     Genetic Testing   Negative genetic testing. No pathogenic variants identified on the [facility] Common Hereditary Cancers Panel. The Common Hereditary Cancers Panel offered by [facility] includes sequencing and/or deletion duplication testing of the following 48 genes: APC, ATM, AXIN2, BARD1, BMPR1A, BRCA1, BRCA2, BRIP1, CDH1, CDKN2A (p14ARF), CDKN2A (p16INK4a), CKD4, CHEK2, CTNNA1, DICER1, EPCAM (Deletion/duplication testing only), GREM1 (promoter region deletion/duplication testing only), KIT, MEN1, MLH1, MSH2, MSH3, MSH6, MUTYH, NBN, NF1, NHTL1, PALB2, PDGFRA, PMS2, POLD1,  POLE, PTEN, RAD50, RAD51C, RAD51D, RNF43, SDHB, SDHC, SDHD, SMAD4, SMARCA4. STK11, TP53, TSC1, TSC2, and  VHL.  The following genes were evaluated for sequence changes only: SDHA and HOXB13 c.251G>A variant only. The report date is 02/01/2019.    06/13/2019 Surgery   Left lumpectomy ([facility]): no residual carcinoma, 2/3 left axillary lymph nodes positive for carcinoma.   06/20/2019 Cancer Staging   Staging form: Breast, AJCC 8th Edition - Pathologic stage from 06/20/2019: No Stage Recommended (ypT0, pN1, cM0, ER+, PR-, HER2-) - Signed by Nicholas Lose, MD on 06/20/2019   06/2019 Surgery   Axillary lymph node dissection: 3/3 lymph nodes positive for cancer   08/20/2019 -  Radiation Therapy   Adjuvant radiation     CHIEF COMPLIANT: Follow-up to discuss antiestrogen therapy  INTERVAL HISTORY: Paige Perry is a 44 y.o. with above-mentioned history of left breast cancer who completed neoadjuvant chemotherapy, underwent a left lumpectomy, and is currently undergoing radiation therapy. She presents to the clinic today to discuss antiestrogen therapy.  She had axillary lymph node dissection which also had 3+ lymph nodes.  She is almost done with radiation therapy and is experiencing some radiation dermatitis.  ALLERGIES:  is allergic to latex, other, and adhesive [tape].  MEDICATIONS:  Current Outpatient Medications  Medication Sig Dispense Refill  . etonogestrel-ethinyl estradiol (NUVARING) 0.12-0.015 MG/24HR vaginal ring     . LORazepam (ATIVAN) 0.5 MG tablet Take 1 tablet (0.5 mg total) by mouth at bedtime as needed (Nausea or vomiting). 30 tablet 0  . nabumetone (RELAFEN) 500 MG tablet Take 1,000 mg by mouth 2 (two) times daily.    Marland Kitchen oxyCODONE (OXY IR/ROXICODONE) 5 MG immediate release tablet Take 1 tablet (5 mg total) by mouth every 6 (six) hours as needed for severe pain. 15 tablet 0  . pantoprazole (PROTONIX) 40 MG tablet TAKE 1 TABLET BY MOUTH EVERY DAY 90 tablet 1  . rosuvastatin (CRESTOR) 20 MG tablet Take 20 mg by mouth daily.     No current facility-administered medications for this visit.      PHYSICAL EXAMINATION: ECOG PERFORMANCE STATUS: 1 - Symptomatic but completely ambulatory  Vitals:   10/02/19 1033  BP: 108/71  Pulse: 88  Resp: 17  Temp: 98.9 F (37.2 C)  SpO2: 100%   Filed Weights   10/02/19 1033  Weight: 193 lb 3.2 oz (87.6 kg)    LABORATORY DATA:  I have reviewed the data as listed CMP Latest Ref Rng & Units 05/28/2019 05/07/2019 04/18/2019  Glucose 70 - 99 mg/dL 107(H) 138(H) 114(H)  BUN 6 - 20 mg/dL 13 14 13   Creatinine 0.44 - 1.00 mg/dL 0.81 0.81 0.78  Sodium 135 - 145 mmol/L 142 139 139  Potassium 3.5 - 5.1 mmol/L 3.5 3.9 3.6  Chloride 98 - 111 mmol/L 108 108 109  CO2 22 - 32 mmol/L 25 21(L) 22  Calcium 8.9 - 10.3 mg/dL 9.1 9.2 8.9  Total Protein 6.5 - 8.1 g/dL 6.7 7.6 7.0  Total Bilirubin 0.3 - 1.2 mg/dL 0.4 0.3 0.3  Alkaline Phos 38 - 126 U/L 86 92 78  AST 15 - 41 U/L 22 16 15   ALT 0 - 44 U/L 34 22 17    Lab Results  Component Value Date   WBC 8.1 05/28/2019   HGB 9.9 (L) 05/28/2019   HCT 29.7 (L) 05/28/2019   MCV 100.0 05/28/2019   PLT 186 05/28/2019   NEUTROABS 4.9 05/28/2019    ASSESSMENT & PLAN:  Malignant neoplasm of upper-outer quadrant  of left breast in female, estrogen receptor positive ([place]) 01/18/2019:Patient palpated a left breast lump for 2 weeks. Mammogram showed a 2.3cm left breast mass at the 12 o'clock position, with 1 enlarged left axillary lymph node measuring 3.5cm. 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 T2N1 stage IIa clinical stage  Treatment plan: 1. Neoadjuvant chemotherapy with TCH Perjeta 6 cycles (maintenance anti-HER-2 therapy is not being given because the initial pathology was reread as HER-2 negative.  The final pathology on the lumpectomy was also HER-2 negative) ER 60% week, PR negative 2. 06/13/2019 left lumpectomy (Ts[facility]i): no residual carcinoma, 2/3 left axillary  lymph nodes positive for carcinoma. 07/15/2019: ALN D: 3/3 lymph nodes positive.  No extranodal extension 3. Followed by adjuvant radiation therapy started 08/20/2019 No adverse effects from participation in the neuropathy clinical trial SWOG 1714 --------------------------------------------------------------------------------------------------------------------------------------- Treatment plan: Adjuvant antiestrogen therapy is recommended based upon ER positive finding. I recommended tamoxifen 20 mg daily x10 years  Tamoxifen counseling: We discussed the risks and benefits of tamoxifen. These include but not limited to insomnia, hot flashes, mood changes, vaginal dryness, and weight gain. Although rare, serious side effects including endometrial cancer, risk of blood clots were also discussed. We strongly believe that the benefits far outweigh the risks. Patient understands these risks and consented to starting treatment. Planned treatment duration is 10 years.  Recommended participation antiestrogen therapy compliance study.  She signed the consent for the  Return to clinic in 3 months for survivorship care plan visit.  No orders of the defined types were placed in this encounter.  The patient has a good understanding of the overall plan. she agrees with it. she will call with any problems that may develop before the next visit here.  Total time spent: 30 mins including face to face time and time spent for planning, charting and coordination of care  Nicholas Lose, MD 10/02/2019  I, Cloyde Reams Dorshimer, am acting as scribe for Dr. Nicholas Lose.  I have reviewed the above documentation for accuracy and completeness, and I agree with the above.

## 2019-10-02 ENCOUNTER — Ambulatory Visit: Payer: BC Managed Care – PPO

## 2019-10-02 ENCOUNTER — Inpatient Hospital Stay: Payer: BC Managed Care – PPO | Attending: Hematology and Oncology | Admitting: Hematology and Oncology

## 2019-10-02 ENCOUNTER — Other Ambulatory Visit: Payer: Self-pay

## 2019-10-02 ENCOUNTER — Ambulatory Visit
Admission: RE | Admit: 2019-10-02 | Discharge: 2019-10-02 | Disposition: A | Discharge status: Home / Self Care | Payer: BC Managed Care – PPO | Source: Ambulatory Visit | Attending: Radiation Oncology | Admitting: Radiation Oncology

## 2019-10-02 DIAGNOSIS — Z79899 Other long term (current) drug therapy: Secondary | ICD-10-CM | POA: Diagnosis not present

## 2019-10-02 DIAGNOSIS — Z793 Long term (current) use of hormonal contraceptives: Secondary | ICD-10-CM | POA: Diagnosis not present

## 2019-10-02 DIAGNOSIS — Z510 Encounter for antineoplastic radiation therapy: Secondary | ICD-10-CM | POA: Diagnosis not present

## 2019-10-02 DIAGNOSIS — Z9221 Personal history of antineoplastic chemotherapy: Secondary | ICD-10-CM | POA: Insufficient documentation

## 2019-10-02 DIAGNOSIS — C50412 Malignant neoplasm of upper-outer quadrant of left female breast: Secondary | ICD-10-CM | POA: Diagnosis present

## 2019-10-02 DIAGNOSIS — Z170 Estrogen receptor positive status [ER+]: Secondary | ICD-10-CM | POA: Insufficient documentation

## 2019-10-02 DIAGNOSIS — Z923 Personal history of irradiation: Secondary | ICD-10-CM | POA: Diagnosis not present

## 2019-10-02 NOTE — Assessment & Plan Note (Signed)
01/18/2019:Patient palpated a left breast lump for 2 weeks. Mammogram showed a 2.3cm left breast mass at the 12 o'clock position, with 1 enlarged left axillary lymph node measuring 3.5cm. 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 T2N1 stage IIa clinical stage  Treatment plan: 1. Neoadjuvant chemotherapy with TCH Perjeta 6 cycles (maintenance anti-HER-2 therapy is not being given because the initial pathology was reread as HER-2 negative.  The final pathology on the lumpectomy was also HER-2 negative) ER 60% week, PR negative 2. 06/13/2019 left lumpectomy (Ts[facility]): no residual carcinoma, 2/3 left axillary lymph nodes positive for carcinoma. 07/15/2019: ALN D: 3/3 lymph nodes positive.  No extranodal extension 3. Followed by adjuvant radiation therapy started 08/20/2019 No adverse effects from participation in the neuropathy clinical trial SWOG 1714 --------------------------------------------------------------------------------------------------------------------------------------- Treatment plan: Adjuvant antiestrogen therapy is recommended based upon ER positive finding. I recommended tamoxifen 20 mg daily x10 years  Tamoxifen counseling: We discussed the risks and benefits of tamoxifen. These include but not limited to insomnia, hot flashes, mood changes, vaginal dryness, and weight gain. Although rare, serious side effects including endometrial cancer, risk of blood clots were also discussed. We strongly believe that the benefits far outweigh the risks. Patient understands these risks and consented to starting treatment. Planned treatment duration is 10 years.  Recommended participation antiestrogen therapy compliance study

## 2019-10-03 ENCOUNTER — Ambulatory Visit
Admission: RE | Admit: 2019-10-03 | Discharge: 2019-10-03 | Disposition: A | Discharge status: Home / Self Care | Payer: BC Managed Care – PPO | Source: Ambulatory Visit | Attending: Radiation Oncology | Admitting: Radiation Oncology

## 2019-10-03 ENCOUNTER — Other Ambulatory Visit: Payer: Self-pay

## 2019-10-03 ENCOUNTER — Ambulatory Visit: Payer: BC Managed Care – PPO

## 2019-10-03 DIAGNOSIS — Z510 Encounter for antineoplastic radiation therapy: Secondary | ICD-10-CM | POA: Diagnosis not present

## 2019-10-04 ENCOUNTER — Other Ambulatory Visit: Payer: Self-pay

## 2019-10-04 ENCOUNTER — Ambulatory Visit: Payer: BC Managed Care – PPO

## 2019-10-04 ENCOUNTER — Ambulatory Visit
Admission: RE | Admit: 2019-10-04 | Discharge: 2019-10-04 | Disposition: A | Discharge status: Home / Self Care | Payer: BC Managed Care – PPO | Source: Ambulatory Visit | Attending: Radiation Oncology | Admitting: Radiation Oncology

## 2019-10-04 DIAGNOSIS — Z510 Encounter for antineoplastic radiation therapy: Secondary | ICD-10-CM | POA: Diagnosis not present

## 2019-10-07 ENCOUNTER — Encounter: Payer: Self-pay | Admitting: Radiation Oncology

## 2019-10-07 ENCOUNTER — Ambulatory Visit: Payer: BC Managed Care – PPO | Attending: Surgery

## 2019-10-07 ENCOUNTER — Encounter: Payer: Self-pay | Admitting: *Deleted

## 2019-10-07 ENCOUNTER — Other Ambulatory Visit: Payer: Self-pay

## 2019-10-07 ENCOUNTER — Ambulatory Visit
Admission: RE | Admit: 2019-10-07 | Discharge: 2019-10-07 | Disposition: A | Discharge status: Home / Self Care | Payer: BC Managed Care – PPO | Source: Ambulatory Visit | Attending: Radiation Oncology | Admitting: Radiation Oncology

## 2019-10-07 DIAGNOSIS — Z510 Encounter for antineoplastic radiation therapy: Secondary | ICD-10-CM | POA: Diagnosis not present

## 2019-10-07 DIAGNOSIS — Z483 Aftercare following surgery for neoplasm: Secondary | ICD-10-CM | POA: Insufficient documentation

## 2019-10-07 NOTE — Therapy (Signed)
[place], [place], [address] Phone: [phone]   Fax:  [phone]  Physical Therapy Treatment  Patient Details  Name: Paige Perry MRN: [id number] Date of Birth: 10/11/1975 Referring Provider (PT): Dr Lindi Adie   Encounter Date: 10/07/2019   PT End of Session - 10/07/19 1632    Visit Number 4    Number of Visits 9    PT Start Time 5885    PT Stop Time 1630    PT Time Calculation (min) 10 min           Past Medical History:  Diagnosis Date  . Asthma   . Cancer [facility])    breast  . Family history of breast cancer   . Family history of breast cancer   . Family history of prostate cancer   . Family history of throat cancer   . Hypercholesteremia   . PONV (postoperative nausea and vomiting)     Past Surgical History:  Procedure Laterality Date  . AXILLARY LYMPH NODE DISSECTION Left 07/11/2019   Procedure: LEFT AXILLARY LYMPH NODE DISSECTION;  Surgeon: Donnie Mesa, MD;  Location: [place];  Service: General;  Laterality: Left;  . BREAST LUMPECTOMY WITH RADIOACTIVE SEED AND AXILLARY LYMPH NODE DISSECTION Left 06/13/2019   Procedure: LEFT BREAST LUMPECTOMY WITH RADIOACTIVE SEED AND AXILLARY TARGETED LYMPH NODE DISSECTION;  Surgeon: Donnie Mesa, MD;  Location: [place];  Service: General;  Laterality: Left;  . FINGER SURGERY    . PORTACATH PLACEMENT Right 02/05/2019   Procedure: INSERTION PORT-A-CATH WITH ULTRASOUND;  Surgeon: Donnie Mesa, MD;  Location: [place];  Service: General;  Laterality: Right;  . WOUND EXPLORATION Right 06/13/2019   Procedure: RIGHT CHEST WOUND EXPLORATION;  Surgeon: Donnie Mesa, MD;  Location: [place];  Service: General;  Laterality: Right;    There were no vitals filed for this visit.   Subjective Assessment - 10/07/19 1625    Subjective Patient is here for a 3 month L-Dex screening                   L-DEX FLOWSHEETS - 10/07/19 1600      L-DEX LYMPHEDEMA SCREENING   Measurement Type Unilateral    L-DEX MEASUREMENT EXTREMITY Upper Extremity    POSITION  Standing    DOMINANT SIDE Left    At Risk Side Left    BASELINE SCORE (UNILATERAL) -4.9    L-DEX SCORE (UNILATERAL) -3    VALUE CHANGE (UNILAT) 1.9                 The patient was assessed using the L-Dex machine today to produce a lymphedema index baseline score. The patient will be reassessed on a regular basis (typically every 3 months) to obtain new L-Dex scores. If the score is > 6.5 points away from his/her baseline score indicating onset of subclinical lymphedema, it will be recommended to wear a compression garment for 4 weeks, 12 hours per day and then be reassessed. If the score continues to be > 6.5 points from baseline at reassessment, we will initiate lymphedema treatment. Assessing in this manner has a 95% rate of preventing clinically significant lymphedema.                   PT Long Term Goals - 09/04/19 1637      PT LONG TERM GOAL #1   Title Pt will report that the pain in her arm  is decreased by 50%    Status Achieved      PT LONG TERM GOAL #2   Title Pt will be verbalize lymphedema risk reduction practices    Status Achieved      PT LONG TERM GOAL #3   Title Pt will be verbalize understanding of Strength ABC program to decrease lymphedema risk    Status Deferred                 Plan - 10/07/19 1632    Clinical Impression Statement No significant L-Dex score change from baseline.    PT Next Visit Plan Continue L-Dex screens every 3 months           Patient will benefit from skilled therapeutic intervention in order to improve the following deficits and impairments:     Visit Diagnosis: Aftercare following surgery for neoplasm     Problem List Patient Active Problem List   Diagnosis Date Noted  . Port-A-Cath in place 03/26/2019  . Genetic  testing 02/01/2019  . Family history of prostate cancer   . Family history of throat cancer   . Family history of breast cancer   . Malignant neoplasm of upper-outer quadrant of left breast in female, estrogen receptor positive ([place]) 01/18/2019  . Other allergic rhinitis 12/03/2014   Annia Friendly, PT 10/07/19 4:33 PM  [place], [place], [address] Phone: [phone]   Fax:  [phone]  Name: Paige Perry MRN: [id number] Date of Birth: 07-27-1975

## 2019-10-08 ENCOUNTER — Ambulatory Visit: Payer: BC Managed Care – PPO

## 2019-10-08 ENCOUNTER — Encounter: Payer: Self-pay | Admitting: *Deleted

## 2019-10-09 ENCOUNTER — Ambulatory Visit: Payer: BC Managed Care – PPO

## 2019-10-10 ENCOUNTER — Ambulatory Visit: Payer: BC Managed Care – PPO

## 2019-10-17 ENCOUNTER — Ambulatory Visit: Payer: BC Managed Care – PPO

## 2019-10-21 ENCOUNTER — Ambulatory Visit: Payer: BC Managed Care – PPO | Attending: Internal Medicine

## 2019-10-21 DIAGNOSIS — Z20822 Contact with and (suspected) exposure to covid-19: Secondary | ICD-10-CM

## 2019-10-22 LAB — NOVEL CORONAVIRUS, NAA: SARS-CoV-2, NAA: DETECTED — AB

## 2019-10-22 LAB — SARS-COV-2, NAA 2 DAY TAT

## 2019-10-23 ENCOUNTER — Telehealth: Payer: Self-pay | Admitting: Adult Health

## 2019-10-23 NOTE — Telephone Encounter (Signed)
Called patient to discuss monoclonal antibody therapy for covid 19 infection as she is at increased risk for complications and or hospitalization from the virus.  She is at risk due to her BMI and her recent h/o immunosuppressive therapy (chemotherapy) and cancer.   She would like to read over the information I sent to her, and will call me back or send me a my chart message if she would like to be scheduled for treatment.    Wilber Bihari, NP

## 2019-10-25 ENCOUNTER — Other Ambulatory Visit: Payer: Self-pay | Admitting: Adult Health

## 2019-10-25 DIAGNOSIS — C50412 Malignant neoplasm of upper-outer quadrant of left female breast: Secondary | ICD-10-CM

## 2019-10-25 MED ORDER — TAMOXIFEN CITRATE 20 MG PO TABS: 20.0000 mg | ORAL_TABLET | Freq: Every day | ORAL | 3 refills | Status: DC

## 2019-10-25 NOTE — Progress Notes (Signed)
Called patient to discuss AET monitoring study.  I sent in her Tamoxifen daily as recommended by Dr. Lindi Adie as she had not yet had these sent in.  I also activated her AET monitoring survey series and she noted that she will go into my chart and complete these.    She will return in 12/2019 but knows to call for any questions in the interim.    Wilber Bihari, NP

## 2019-10-29 ENCOUNTER — Encounter (INDEPENDENT_AMBULATORY_CARE_PROVIDER_SITE_OTHER): Payer: Self-pay

## 2019-10-30 ENCOUNTER — Telehealth: Payer: Self-pay | Admitting: Radiation Oncology

## 2019-10-30 NOTE — Telephone Encounter (Signed)
  Radiation Oncology         [phone] ________________________________  Name: Paige Perry MRN: [id number]  Date of Service: 10/31/19  DOB: 1975/07/04  Post Treatment Telephone Note  Diagnosis:   Stage IIA, cT2N1M0 grade 3, ER positive, HER2 amplified invasive ductal carcinoma of the left breast.   Interval Since Last Radiation: 3 weeks   08/19/19-10/07/19:  The left breast and regional nodes were treated to 50.4 Gy in 28 fractions followed by a 10 Gy boost in 5 fractions  Narrative:  The patient was contacted today for routine follow-up. During treatment she did very well with radiotherapy and did not have significant desquamation. She reports she is doing well and her skin is almost completely back to normal appearance.  Impression/Plan: 1. 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  The patient has been doing well since completion of radiotherapy. We discussed that we would be happy to continue to follow her as needed, but she will also continue to follow up with Dr. Lindi Adie in medical oncology. She was counseled on skin care as well as measures to avoid sun exposure to this area.  2. Survivorship. We discussed the importance of survivorship evaluation and encouraged her to attend her upcoming visit with that clinic.    Carola Rhine, PAC

## 2019-11-01 NOTE — Progress Notes (Signed)
  Radiation Oncology         [phone] ________________________________  Name: Paige Perry MRN: [id number]  Date: 10/07/2019  DOB: [birth year]  End of Treatment Note  Diagnosis:   left-sided breast cancer     Indication for treatment:  Curative       Radiation treatment dates:   08/19/19 - 10/07/19  Site/dose:   The patient initially received a dose of 50.4 Gy in 28 fractions to the breast and SCLV region using a 4-field approach. This was delivered using a 3-D conformal technique. The patient then received a boost to the seroma. This delivered an additional 10 Gy in 5 fractions using a 3-field photon boost technique. The total dose was 60.4 Gy.  Narrative: The patient tolerated radiation treatment relatively well.   The patient had some expected skin irritation as she progressed during treatment.   Plan: The patient has completed radiation treatment. The patient will return to radiation oncology clinic for routine followup in one month. I advised the patient to call or return sooner if they have any questions or concerns related to their recovery or treatment. ________________________________  Jodelle Gross, M.D., Ph.D.

## 2019-11-15 ENCOUNTER — Encounter (INDEPENDENT_AMBULATORY_CARE_PROVIDER_SITE_OTHER): Payer: Self-pay

## 2019-11-22 ENCOUNTER — Encounter (INDEPENDENT_AMBULATORY_CARE_PROVIDER_SITE_OTHER): Payer: Self-pay

## 2019-11-28 ENCOUNTER — Encounter: Payer: Self-pay | Admitting: Hematology and Oncology

## 2019-11-29 ENCOUNTER — Encounter (INDEPENDENT_AMBULATORY_CARE_PROVIDER_SITE_OTHER): Payer: Self-pay

## 2019-12-13 ENCOUNTER — Encounter (INDEPENDENT_AMBULATORY_CARE_PROVIDER_SITE_OTHER): Payer: Self-pay

## 2019-12-20 ENCOUNTER — Encounter (INDEPENDENT_AMBULATORY_CARE_PROVIDER_SITE_OTHER): Payer: Self-pay

## 2020-01-02 ENCOUNTER — Encounter: Payer: BC Managed Care – PPO | Admitting: Adult Health

## 2020-01-10 ENCOUNTER — Encounter (INDEPENDENT_AMBULATORY_CARE_PROVIDER_SITE_OTHER): Payer: Self-pay

## 2020-01-13 ENCOUNTER — Ambulatory Visit: Payer: BC Managed Care – PPO | Attending: Hematology and Oncology

## 2020-01-13 ENCOUNTER — Other Ambulatory Visit: Payer: Self-pay

## 2020-01-13 DIAGNOSIS — Z483 Aftercare following surgery for neoplasm: Secondary | ICD-10-CM | POA: Insufficient documentation

## 2020-01-13 NOTE — Therapy (Signed)
[place], [place], [address] Phone: [phone]   Fax:  [phone]  Physical Therapy Treatment  Patient Details  Name: Paige Perry MRN: [id number] Date of Birth: 1976-03-28 Referring Provider (PT): Dr Lindi Adie   Encounter Date: 01/13/2020   PT End of Session - 01/13/20 1015    Visit Number 4   # unchanged due to screen only   Number of Visits 9    Date for PT Re-Evaluation 10/21/19    PT Start Time 1003    PT Stop Time 1012    PT Time Calculation (min) 9 min    Activity Tolerance Patient tolerated treatment well    Behavior During Therapy [facility] for tasks assessed/performed           Past Medical History:  Diagnosis Date  . Asthma   . Cancer [facility])    breast  . Family history of breast cancer   . Family history of breast cancer   . Family history of prostate cancer   . Family history of throat cancer   . Hypercholesteremia   . PONV (postoperative nausea and vomiting)     Past Surgical History:  Procedure Laterality Date  . AXILLARY LYMPH NODE DISSECTION Left 07/11/2019   Procedure: LEFT AXILLARY LYMPH NODE DISSECTION;  Surgeon: Donnie Mesa, MD;  Location: [place];  Service: General;  Laterality: Left;  . BREAST LUMPECTOMY WITH RADIOACTIVE SEED AND AXILLARY LYMPH NODE DISSECTION Left 06/13/2019   Procedure: LEFT BREAST LUMPECTOMY WITH RADIOACTIVE SEED AND AXILLARY TARGETED LYMPH NODE DISSECTION;  Surgeon: Donnie Mesa, MD;  Location: [place];  Service: General;  Laterality: Left;  . FINGER SURGERY    . PORTACATH PLACEMENT Right 02/05/2019   Procedure: INSERTION PORT-A-CATH WITH ULTRASOUND;  Surgeon: Donnie Mesa, MD;  Location: [place];  Service: General;  Laterality: Right;  . WOUND EXPLORATION Right 06/13/2019   Procedure: RIGHT CHEST WOUND EXPLORATION;  Surgeon: Donnie Mesa, MD;  Location: [place];  Service:  General;  Laterality: Right;    There were no vitals filed for this visit.   Subjective Assessment - 01/13/20 1008    Subjective I'm back for my 6 month L-Dex screen. I want to talk to you about some fullness in my breast and tightness in my Lt axilla I've been feeling.    Pertinent History Patient was diagnosed on 01/14/2019 with left grade III invasive ductal carcinoma breast cancer. It measures 2.3 cm and is located in the upper outer quadrant. It is ER positive, PR negative, and HER2 postive with a Ki67 of 80%. She has a known positive axillary lymph node. She underwent a lumpectomy with sentinal node diseection on 3/18/ 2021 and needs to go back for a full ALND (5 nodes?) on 07/11/2019. She started radiation on 08/19/2019                  L-DEX FLOWSHEETS - 01/13/20 1000      L-DEX LYMPHEDEMA SCREENING   BASELINE SCORE (UNILATERAL) -4.9    L-DEX SCORE (UNILATERAL) -0.1    VALUE CHANGE (UNILAT) 4.8                                  PT Long Term Goals - 09/04/19 1637      PT LONG TERM GOAL #1   Title Pt will report that the pain in her arm  is decreased by 50%    Status Achieved      PT LONG TERM GOAL #2   Title Pt will be verbalize lymphedema risk reduction practices    Status Achieved      PT LONG TERM GOAL #3   Title Pt will be verbalize understanding of Strength ABC program to decrease lymphedema risk    Status Deferred                 Plan - 01/13/20 1016    Clinical Impression Statement Pt returns for 6 month L-Dex screen. Her change from baseline is 4.8 which is WNLs. Pt reports she has been experiencing some tightness in axilla and fullness in breast. she reports has been doing her self MLD for UE so briefly instructed her how to incorporate breast in to self MLD and to continue stretching. Pt vrebalizes good understanding of this and wants to wait on scheduling an appt with [place] so she can try this at home first. If no improvement will  call for a re-eval with [place]. Pt has also been compliant with wearing her compression bra and sleeve.    PT Next Visit Plan Continue L-Dex screens every 3 months.    PT Home Exercise Plan Self MLD daily, remedial exercises    Consulted and Agree with Plan of Care Patient           Patient will benefit from skilled therapeutic intervention in order to improve the following deficits and impairments:     Visit Diagnosis: Aftercare following surgery for neoplasm     Problem List Patient Active Problem List   Diagnosis Date Noted  . Port-A-Cath in place 03/26/2019  . Genetic testing 02/01/2019  . Family history of prostate cancer   . Family history of throat cancer   . Family history of breast cancer   . Malignant neoplasm of upper-outer quadrant of left breast in female, estrogen receptor positive ([place]) 01/18/2019  . Other allergic rhinitis 12/03/2014    Otelia Limes, PTA 01/13/2020, 10:21 AM  [place], [place], [address] Phone: [phone]   Fax:  [phone]  Name: Paige Perry MRN: [id number] Date of Birth: 1976/02/17

## 2020-01-17 ENCOUNTER — Encounter (INDEPENDENT_AMBULATORY_CARE_PROVIDER_SITE_OTHER): Payer: Self-pay

## 2020-01-21 ENCOUNTER — Encounter: Payer: Self-pay | Admitting: Adult Health

## 2020-01-21 ENCOUNTER — Inpatient Hospital Stay: Payer: BC Managed Care – PPO | Attending: Hematology and Oncology | Admitting: Adult Health

## 2020-01-21 DIAGNOSIS — C50412 Malignant neoplasm of upper-outer quadrant of left female breast: Secondary | ICD-10-CM

## 2020-01-21 DIAGNOSIS — Z170 Estrogen receptor positive status [ER+]: Secondary | ICD-10-CM | POA: Diagnosis not present

## 2020-01-21 NOTE — Progress Notes (Signed)
SURVIVORSHIP VIRTUAL VISIT:  I connected with Paige Perry on 01/21/20 at 10:00 AM EDT by my chart video and verified that I am speaking with the correct person using two identifiers.  I discussed the limitations, risks, security and privacy concerns of performing an evaluation and management service virtually and the availability of in person appointments. I also discussed with the patient that there may be a patient responsible charge related to this service. The patient expressed understanding and agreed to proceed.   Patient location: home Provider location: home Others participating in call: none  BRIEF ONCOLOGIC HISTORY:  Oncology History  Malignant neoplasm of upper-outer quadrant of left breast in female, estrogen receptor positive ([place])  01/18/2019 Initial Diagnosis   Patient palpated a left breast lump for 2 weeks. Mammogram showed a 2.3cm left breast mass at the 12 o'clock position, with 1 enlarged left axillary lymph node measuring 3.5cm. 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   01/23/2019 Cancer Staging   Staging form: Breast, AJCC 8th Edition - Clinical stage from 01/23/2019: Stage IIA (cT2, cN1(f), cM0, G2, ER+, PR-, HER2+)   02/01/2019 Genetic Testing   Negative genetic testing. No pathogenic variants identified on the [facility] Common Hereditary Cancers Panel. The Common Hereditary Cancers Panel offered by [facility] includes sequencing and/or deletion duplication testing of the following 48 genes: APC, ATM, AXIN2, BARD1, BMPR1A, BRCA1, BRCA2, BRIP1, CDH1, CDKN2A (p14ARF), CDKN2A (p16INK4a), CKD4, CHEK2, CTNNA1, DICER1, EPCAM (Deletion/duplication testing only), GREM1 (promoter region deletion/duplication testing only), KIT, MEN1, MLH1, MSH2, MSH3, MSH6, MUTYH, NBN, NF1, NHTL1, PALB2, PDGFRA, PMS2, POLD1, POLE, PTEN, RAD50, RAD51C, RAD51D, RNF43, SDHB, SDHC, SDHD, SMAD4, SMARCA4. STK11, TP53, TSC1, TSC2, and VHL.  The following genes were evaluated for  sequence changes only: SDHA and HOXB13 c.251G>A variant only. The report date is 02/01/2019.    02/13/2019 - 05/28/2019 Chemotherapy   dexamethasone (DECADRON) 4 MG tablet, 4 mg (100 % of original dose 4 mg), Oral, Daily, 1 of 1 cycle, Start date: 01/23/2019, End date: 05/28/2019. Dose modification: 4 mg (original dose 4 mg, Cycle 0)  palonosetron (ALOXI) injection 0.25 mg, 0.25 mg, Intravenous,  Once, 6 of 6 cycles. Administration: 0.25 mg (02/13/2019), 0.25 mg (03/04/2019), 0.25 mg (05/07/2019), 0.25 mg (05/28/2019), 0.25 mg (03/26/2019), 0.25 mg (04/18/2019)  pegfilgrastim-jmdb (FULPHILA) injection 6 mg, 6 mg, Subcutaneous,  Once, 6 of 6 cycles. Administration: 6 mg (02/15/2019), 6 mg (03/06/2019), 6 mg (05/09/2019), 6 mg (05/30/2019), 6 mg (03/28/2019), 6 mg (04/20/2019)  CARBOplatin (PARAPLATIN) 700 mg in sodium chloride 0.9 % 250 mL chemo infusion, 700 mg (100 % of original dose 700 mg), Intravenous,  Once, 6 of 6 cycles. Dose modification: 700 mg (original dose 700 mg, Cycle 1). Administration: 700 mg (02/13/2019), 700 mg (03/04/2019), 700 mg (05/07/2019), 700 mg (05/28/2019), 700 mg (03/26/2019), 700 mg (04/18/2019)  DOCEtaxel (TAXOTERE) 140 mg in sodium chloride 0.9 % 250 mL chemo infusion, 75 mg/m2 = 140 mg, Intravenous,  Once, 6 of 6 cycles. Dose modification: 65 mg/m2 (original dose 75 mg/m2, Cycle 5, Reason: Dose not tolerated). Administration: 140 mg (02/13/2019), 140 mg (03/04/2019), 120 mg (05/07/2019), 120 mg (05/28/2019), 140 mg (03/26/2019), 140 mg (04/18/2019)  pertuzumab (PERJETA) 420 mg in sodium chloride 0.9 % 250 mL chemo infusion, 420 mg (100 % of original dose 420 mg), Intravenous, Once, 3 of 3 cycles. Dose modification: 420 mg (original dose 420 mg, Cycle 1, Reason: Provider Judgment). Administration: 420 mg (02/13/2019), 420 mg (03/04/2019), 420 mg (03/26/2019)  fosaprepitant (EMEND) 150 mg  dexamethasone (DECADRON) 12 mg in sodium chloride 0.9 % 145 mL IVPB, , Intravenous,  Once, 6 of 6 cycles.  Administration:  (02/13/2019),  (03/04/2019),  (05/07/2019),  (05/28/2019),  (03/26/2019),  (04/18/2019)  trastuzumab-dkst (OGIVRI) 651 mg in sodium chloride 0.9 % 250 mL chemo infusion, 8 mg/kg = 651 mg, Intravenous,  Once, 6 of 6 cycles. Administration: 651 mg (02/13/2019), 483 mg (03/04/2019), 483 mg (05/07/2019), 483 mg (05/28/2019), 483 mg (03/26/2019), 483 mg (04/18/2019).   06/13/2019 Surgery   Left lumpectomy ([place]) [phone]): no residual carcinoma, 2/3 left axillary lymph nodes positive for carcinoma. Lymphovascular space invasion was present.   06/20/2019 Cancer Staging   Staging form: Breast, AJCC 8th Edition - Pathologic stage from 06/20/2019: No Stage Recommended (ypT0, pN1, cM0, ER+, PR-, HER2-)   07/11/2019 Surgery   Axillary lymph node dissection ([place]) ([id number]): 3/3 lymph nodes positive for cancer   08/19/2019 - 10/07/2019 Radiation Therapy   The patient initially received a dose of 50.4 Gy in 28 fractions to the breast and SCLV region using a 4-field approach. This was delivered using a 3-D conformal technique. The patient then received a boost to the seroma. This delivered an additional 10 Gy in 5 fractions using a 3-field photon boost technique. The total dose was 60.4 Gy.   09/2019 - 09/2029 Anti-estrogen oral therapy   Tamoxifen     INTERVAL HISTORY:  Paige Perry to review her survivorship care plan detailing her treatment course for breast cancer, as well as monitoring long-term side effects of that treatment, education regarding health maintenance, screening, and overall wellness and health promotion.     Overall, Paige Perry reports feeling quite well.  She is taking Tamoxifen daily.  She does not note an increase in hot flashes.  She has some chronic pain in her left arm and left breast that has been present since surgery.  She also notes that her breast is swollen and she is doing exercises to move the fluid as recommended by physical therapy.    REVIEW OF SYSTEMS:   Review of Systems  Constitutional: Negative for appetite change, chills, fatigue, fever and unexpected weight change.  HENT:   Negative for hearing loss and lump/mass.   Eyes: Negative for eye problems and icterus.  Respiratory: Negative for chest tightness, cough and shortness of breath.   Cardiovascular: Negative for chest pain, leg swelling and palpitations.  Gastrointestinal: Negative for abdominal distention, abdominal pain, constipation, diarrhea, nausea and vomiting.  Musculoskeletal: Negative for arthralgias.  Skin: Negative for itching and rash.  Neurological: Negative for dizziness, extremity weakness and headaches.  Hematological: Negative for adenopathy. Does not bruise/bleed easily.  Psychiatric/Behavioral: Negative for depression. The patient is not nervous/anxious.   Breast: Denies any new nodularity, masses, tenderness, nipple changes, or nipple discharge.      ONCOLOGY TREATMENT TEAM:  1. Surgeon:  Dr. Georgette Dover at [facility] 2. Medical Oncologist: Dr. Lindi Adie    PAST MEDICAL/SURGICAL HISTORY:  Past Medical History:  Diagnosis Date  . Asthma   . Cancer [facility])    breast  . Family history of breast cancer   . Family history of breast cancer   . Family history of prostate cancer   . Family history of throat cancer   . Hypercholesteremia   . PONV (postoperative nausea and vomiting)    Past Surgical History:  Procedure Laterality Date  . AXILLARY LYMPH NODE DISSECTION Left 07/11/2019   Procedure: LEFT AXILLARY LYMPH NODE DISSECTION;  Surgeon: Donnie Mesa, MD;  Location: [facility];  Service: General;  Laterality: Left;  . BREAST LUMPECTOMY WITH RADIOACTIVE SEED AND AXILLARY LYMPH NODE DISSECTION Left 06/13/2019   Procedure: LEFT BREAST LUMPECTOMY WITH RADIOACTIVE SEED AND AXILLARY TARGETED LYMPH NODE DISSECTION;  Surgeon: Donnie Mesa, MD;  Location: [place];  Service: General;  Laterality: Left;  . FINGER SURGERY     . PORTACATH PLACEMENT Right 02/05/2019   Procedure: INSERTION PORT-A-CATH WITH ULTRASOUND;  Surgeon: Donnie Mesa, MD;  Location: [place];  Service: General;  Laterality: Right;  . WOUND EXPLORATION Right 06/13/2019   Procedure: RIGHT CHEST WOUND EXPLORATION;  Surgeon: Donnie Mesa, MD;  Location: [place];  Service: General;  Laterality: Right;     ALLERGIES:  Allergies  Allergen Reactions  . Latex   . Other     Corn  . Adhesive [Tape] Rash     CURRENT MEDICATIONS:  Outpatient Encounter Medications as of 01/21/2020  Medication Sig  . oxyCODONE (OXY IR/ROXICODONE) 5 MG immediate release tablet Take 1 tablet (5 mg total) by mouth every 6 (six) hours as needed for severe pain.  . pantoprazole (PROTONIX) 40 MG tablet TAKE 1 TABLET BY MOUTH EVERY DAY  . rosuvastatin (CRESTOR) 20 MG tablet Take 20 mg by mouth daily.  . tamoxifen (NOLVADEX) 20 MG tablet Take 1 tablet (20 mg total) by mouth daily.   No facility-administered encounter medications on file as of 01/21/2020.     ONCOLOGIC FAMILY HISTORY:  Family History  Problem Relation Age of Onset  . Hypertension Mother   . Heart failure Mother   . Breast cancer Maternal Aunt        dx 84s  . Breast cancer Paternal Aunt   . Prostate cancer Father 53  . Throat cancer Cousin      GENETIC COUNSELING/TESTING: See above  SOCIAL HISTORY:  Social History   Socioeconomic History  . Marital status: Single    Spouse name: Not on file  . Number of children: Not on file  . Years of education: Not on file  . Highest education level: Not on file  Occupational History  . Not on file  Tobacco Use  . Smoking status: Never Smoker  . Smokeless tobacco: Never Used  Vaping Use  . Vaping Use: Never used  Substance and Sexual Activity  . Alcohol use: Yes    Comment: occasionally  . Drug use: No  . Sexual activity: Yes    Birth control/protection: Diaphragm  Other Topics Concern  . Not on  file  Social History Narrative  . Not on file   Social Determinants of Health   Financial Resource Strain:   . Difficulty of Paying Living Expenses: Not on file  Food Insecurity:   . Worried About Charity fundraiser in the Last Year: Not on file  . Ran Out of Food in the Last Year: Not on file  Transportation Needs:   . Lack of Transportation (Medical): Not on file  . Lack of Transportation (Non-Medical): Not on file  Physical Activity:   . Days of Exercise per Week: Not on file  . Minutes of Exercise per Session: Not on file  Stress:   . Feeling of Stress : Not on file  Social Connections:   . Frequency of Communication with Friends and Family: Not on file  . Frequency of Social Gatherings with Friends and Family: Not on file  . Attends Religious Services: Not on file  . Active Member of Clubs or Organizations:  Not on file  . Attends Archivist Meetings: Not on file  . Marital Status: Not on file  Intimate Partner Violence:   . Fear of Current or Ex-Partner: Not on file  . Emotionally Abused: Not on file  . Physically Abused: Not on file  . Sexually Abused: Not on file     OBSERVATIONS/OBJECTIVE:  Patient appears well.  In no apparent distress.  Breathing is non labored.  Skin visualized without rash or lesion.  Mood and behavior are normal  LABORATORY DATA:  None for this visit.  DIAGNOSTIC IMAGING:  None for this visit.      ASSESSMENT AND PLAN:  Paige Perry is a pleasant 44 y.o. female with Stage IIA left breast invasive ductal carcinoma, ER+/PR-/HER2+, diagnosed in 12/2018, treated with neoadjuvant chemotherapy, lumpectomy, adjuvant radiation therapy, maintenance Trastuzumab and anti-estrogen therapy with Tamoxifen beginning in 09/2019.  She presents to the Survivorship Clinic for our initial meeting and routine follow-up post-completion of treatment for breast cancer.    1. Stage IIA left breast cancer:  Paige Perry is continuing to recover from  definitive treatment for breast cancer. She will follow-up with her medical oncologist, Dr. Lindi Adie in 04/2020 with history and physical exam per surveillance protocol.  She will continue her anti-estrogen therapy with Tamoxifen. Thus far, she is tolerating the Tamoxifen well, with minimal side effects. She was instructed to make Dr. Lindi Adie or myself aware if she begins to experience any worsening side effects of the medication and I could see her back in clinic to help manage those side effects, as needed. Her mammogram is due 03/2019; orders placed today. Today, a comprehensive survivorship care plan and treatment summary was reviewed with the patient today detailing her breast cancer diagnosis, treatment course, potential late/long-term effects of treatment, appropriate follow-up care with recommendations for the future, and patient education resources.  A copy of this summary, along with a letter will be sent to the patient's primary care provider via mail/fax/In Basket message after today's visit.    2. Bone health:  She was given education on specific activities to promote bone health.  3. Cancer screening:  Due to Paige Perry's history and her age, she should receive screening for skin cancers, colon cancer, and gynecologic cancers.  The information and recommendations are listed on the patient's comprehensive care plan/treatment summary and were reviewed in detail with the patient.    4. Health maintenance and wellness promotion: Paige Perry was encouraged to consume 5-7 servings of fruits and vegetables per day. We reviewed the "Nutrition Rainbow" handout, as well as the handout "Take Control of Your Health and Reduce Your Cancer Risk" from the [place].  She was also encouraged to engage in moderate to vigorous exercise for 30 minutes per day most days of the week. We discussed the [employer] fitness program, which is designed for cancer survivors to help them become more physically fit  after cancer treatments.  She was instructed to limit her alcohol consumption and continue to abstain from tobacco use.     5. Support services/counseling: It is not uncommon for this period of the patient's cancer care trajectory to be one of many emotions and stressors.  We discussed how this can be increasingly difficult during the times of quarantine and social distancing due to the COVID-19 pandemic.   She was given information regarding our available services and encouraged to contact me with any questions or for help enrolling in any of our support group/programs.  Follow up instructions:    -Return to cancer center in 04/2020 for f/u with Dr. Lindi Adie  -Mammogram due in 03/2020 -Follow up with surgery 10/2020 -She is welcome to return back to the Survivorship Clinic at any time; no additional follow-up needed at this time.  -Consider referral back to survivorship as a long-term survivor for continued surveillance  The patient was provided an opportunity to ask questions and all were answered. The patient agreed with the plan and demonstrated an understanding of the instructions.   The patient was advised to call back or seek an in-person evaluation if the symptoms worsen or if the condition fails to improve as anticipated.   I provided 30 minutes of face-to-face video visit time during this encounter, and > 50% was spent counseling as documented under my assessment & plan.  Wilber Bihari, NP 01/21/20 10:00 AM Medical Oncology and Hematology [facility] [place], [place] [address] Tel. [phone]    Fax. [phone]  *Total Encounter Time as defined by the Centers for Medicare and Medicaid Services includes, in addition to the face-to-face time of a patient visit (documented in the note above) non-face-to-face time: obtaining and reviewing outside history, ordering and reviewing medications, tests or procedures, care coordination (communications with other  health care professionals or caregivers) and documentation in the medical record.

## 2020-01-23 ENCOUNTER — Telehealth: Payer: Self-pay | Admitting: Adult Health

## 2020-01-23 NOTE — Telephone Encounter (Signed)
No 10/26 los, no changes made to pt schedule

## 2020-01-24 ENCOUNTER — Encounter (INDEPENDENT_AMBULATORY_CARE_PROVIDER_SITE_OTHER): Payer: Self-pay

## 2020-01-27 ENCOUNTER — Telehealth: Payer: Self-pay | Admitting: Hematology and Oncology

## 2020-01-27 NOTE — Telephone Encounter (Signed)
Scheduled appt per 10/31 sch msg - left message for patient with appt date and time

## 2020-02-07 ENCOUNTER — Encounter (INDEPENDENT_AMBULATORY_CARE_PROVIDER_SITE_OTHER): Payer: Self-pay

## 2020-02-13 ENCOUNTER — Encounter: Payer: Self-pay | Admitting: Adult Health

## 2020-02-14 ENCOUNTER — Other Ambulatory Visit: Payer: Self-pay

## 2020-02-14 ENCOUNTER — Encounter (HOSPITAL_COMMUNITY): Payer: BC Managed Care – PPO

## 2020-02-14 ENCOUNTER — Telehealth: Payer: Self-pay

## 2020-02-14 ENCOUNTER — Encounter (INDEPENDENT_AMBULATORY_CARE_PROVIDER_SITE_OTHER): Payer: Self-pay

## 2020-02-14 DIAGNOSIS — C50412 Malignant neoplasm of upper-outer quadrant of left female breast: Secondary | ICD-10-CM

## 2020-02-14 NOTE — Telephone Encounter (Signed)
This LPN called pt to check on her after she reported RLE swelling via MyChart. Pt states "My whole leg isn't swollen, just on the back of my leg where my knee is." Pt denies pain and states she noticed this today.  Per Dr Lindi Adie, pt should have doppler to RLE. This LPN offered pt an appt for today at 4 PM with vas lab at [facility] and pt states she can not come in because she has to pick up her child from school. Offered pt an appt for Monday, 02/17/20 at 8 AM at [facility]. Pt accepted.   This LPN advised pt to go to ED over the weekend if sx worsen or if she develops ShOB and discussed with pt risk for DVT while on Tamoxifen. Pt verbalized understanding and agreement.

## 2020-02-17 ENCOUNTER — Ambulatory Visit (HOSPITAL_COMMUNITY)
Admission: RE | Admit: 2020-02-17 | Discharge: 2020-02-17 | Disposition: A | Discharge status: Home / Self Care | Payer: BC Managed Care – PPO | Source: Ambulatory Visit | Attending: Hematology and Oncology | Admitting: Hematology and Oncology

## 2020-02-17 ENCOUNTER — Other Ambulatory Visit: Payer: Self-pay

## 2020-02-17 DIAGNOSIS — C50412 Malignant neoplasm of upper-outer quadrant of left female breast: Secondary | ICD-10-CM | POA: Diagnosis not present

## 2020-02-17 DIAGNOSIS — Z170 Estrogen receptor positive status [ER+]: Secondary | ICD-10-CM | POA: Diagnosis not present

## 2020-02-17 NOTE — Progress Notes (Signed)
VASCULAR LAB    Right lower extremity venous duplex has been performed.  See CV proc for preliminary results.  Called Dr. Lindi Adie with results.   Suede Greenawalt, RVT 02/17/2020, 8:29 AM

## 2020-02-21 ENCOUNTER — Encounter (INDEPENDENT_AMBULATORY_CARE_PROVIDER_SITE_OTHER): Payer: Self-pay

## 2020-02-28 ENCOUNTER — Encounter (INDEPENDENT_AMBULATORY_CARE_PROVIDER_SITE_OTHER): Payer: Self-pay

## 2020-03-06 ENCOUNTER — Encounter (INDEPENDENT_AMBULATORY_CARE_PROVIDER_SITE_OTHER): Payer: Self-pay

## 2020-03-20 ENCOUNTER — Encounter (INDEPENDENT_AMBULATORY_CARE_PROVIDER_SITE_OTHER): Payer: Self-pay

## 2020-03-27 ENCOUNTER — Encounter (INDEPENDENT_AMBULATORY_CARE_PROVIDER_SITE_OTHER): Payer: Self-pay

## 2020-04-02 ENCOUNTER — Ambulatory Visit
Admission: RE | Admit: 2020-04-02 | Discharge: 2020-04-02 | Disposition: A | Discharge status: Home / Self Care | Payer: BC Managed Care – PPO | Source: Ambulatory Visit | Attending: Adult Health | Admitting: Adult Health

## 2020-04-02 ENCOUNTER — Other Ambulatory Visit: Payer: Self-pay

## 2020-04-02 DIAGNOSIS — Z170 Estrogen receptor positive status [ER+]: Secondary | ICD-10-CM

## 2020-04-02 DIAGNOSIS — C50412 Malignant neoplasm of upper-outer quadrant of left female breast: Secondary | ICD-10-CM

## 2020-04-02 IMAGING — MG DIGITAL DIAGNOSTIC BILAT W/ TOMO W/ CAD
6 of 9 series · 6 of 25 positions shown · non-contrast
Comparison: Previous exam(s).

CLINICAL DATA: First routine annual mammographic surveillance.
Patient underwent left lumpectomy for breast carcinoma in [DATE]
treated with also with chemotherapy and radiation therapy.

EXAM:
DIGITAL DIAGNOSTIC BILATERAL MAMMOGRAM WITH TOMO AND CAD

[L MLO]
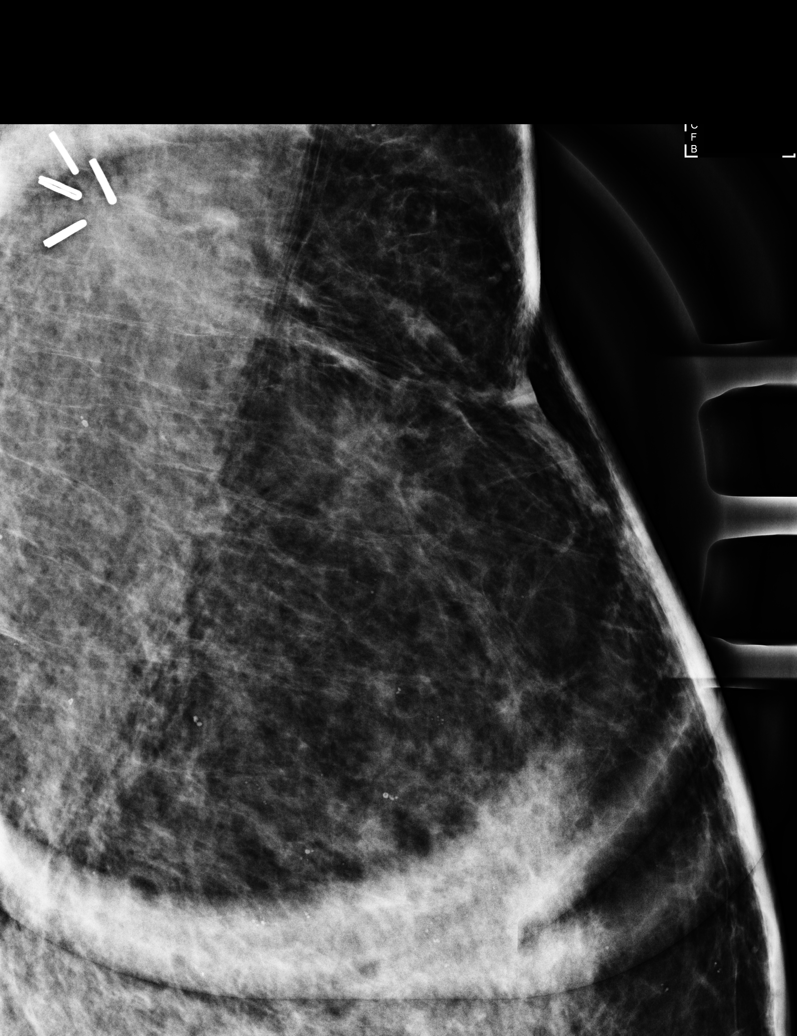

[L MLO synth-2D]
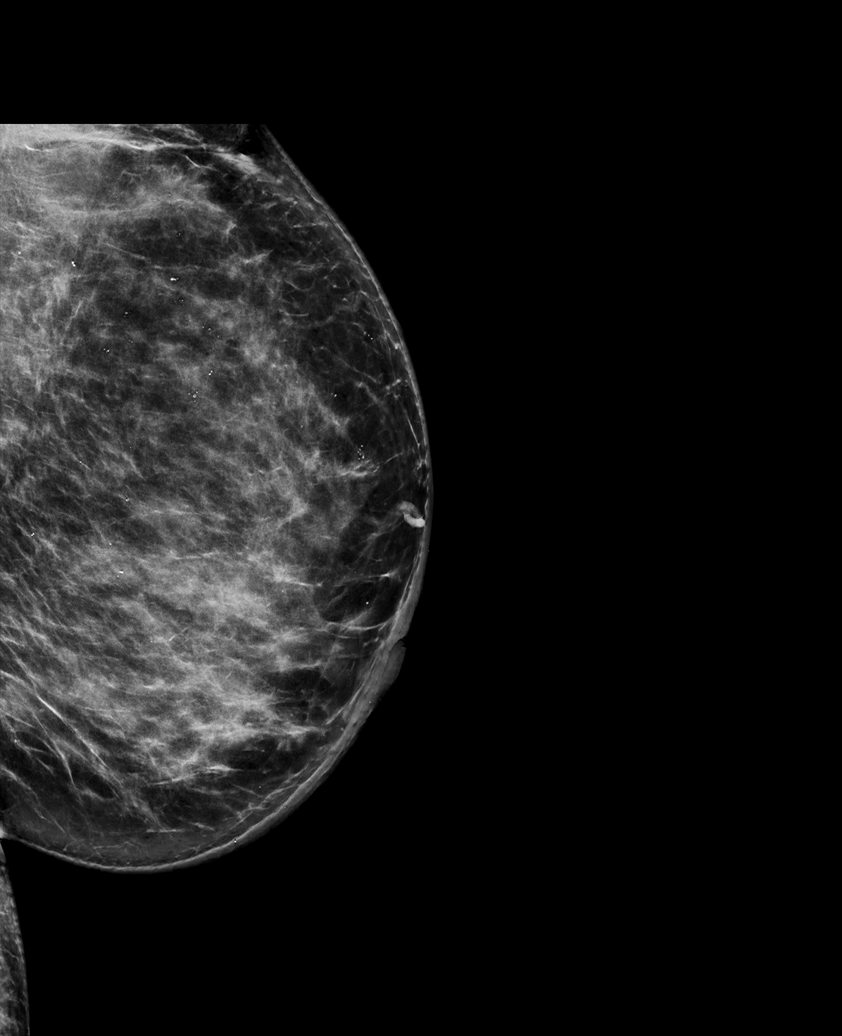

[L CC synth-2D]
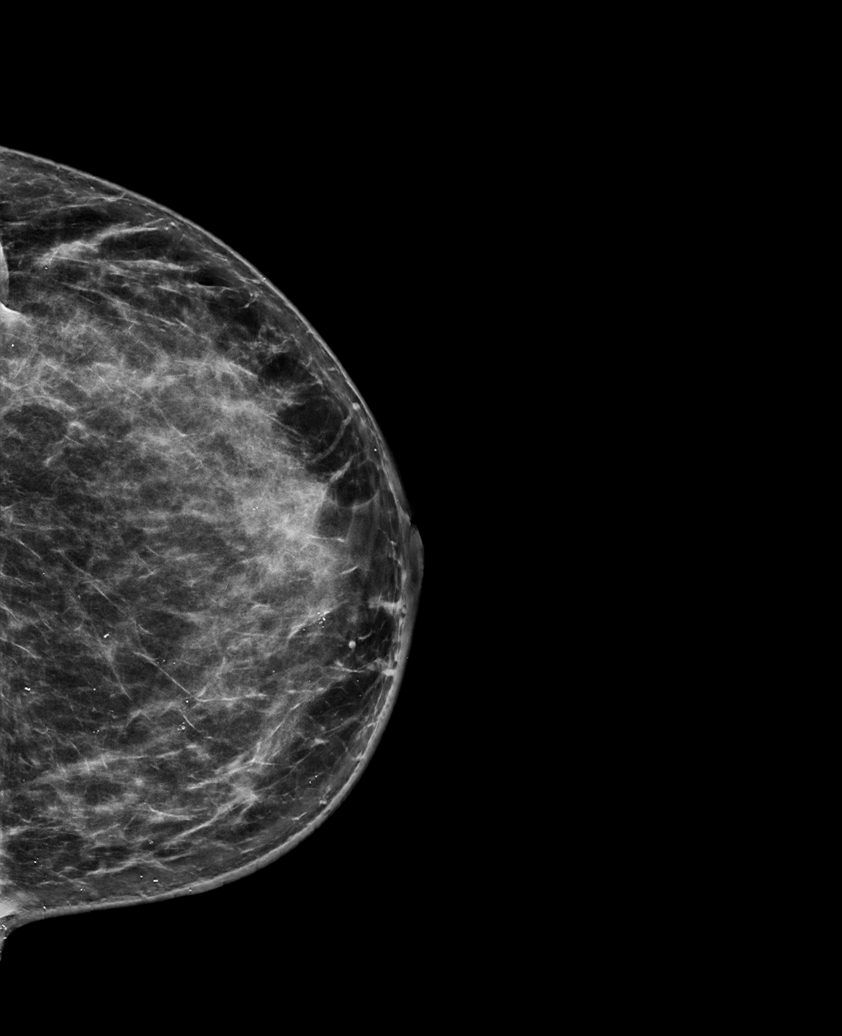

[R CC synth-2D]
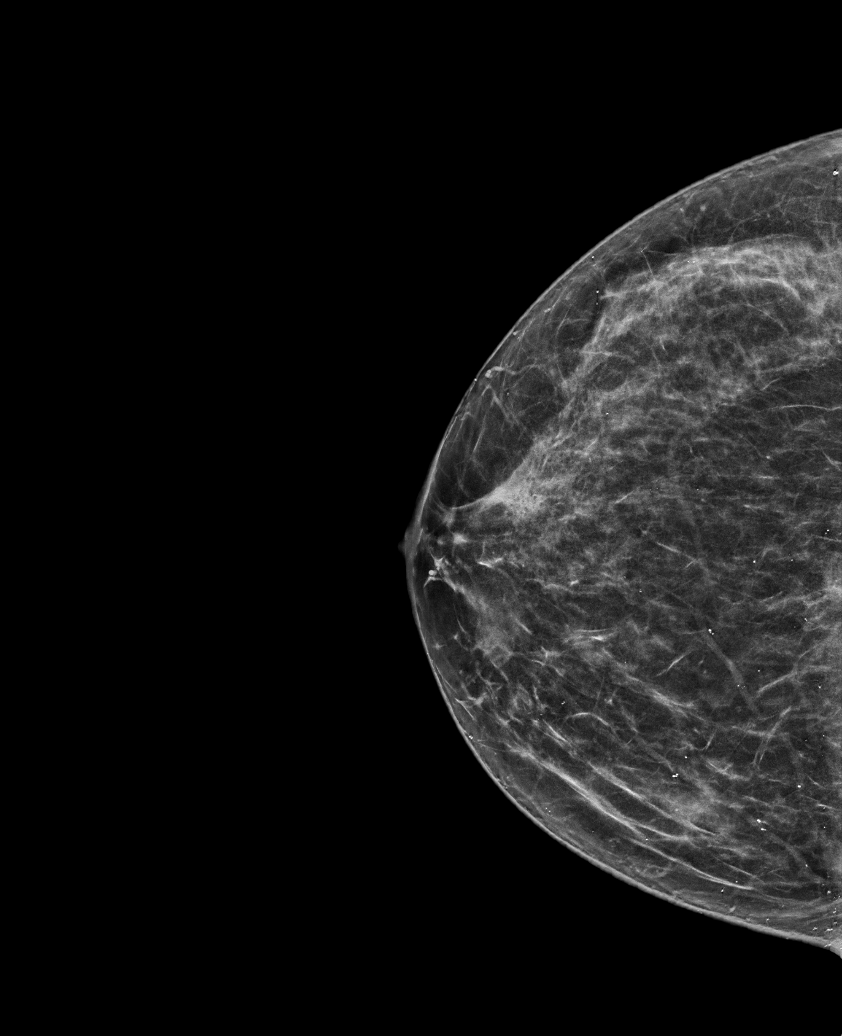

[R MLO synth-2D]
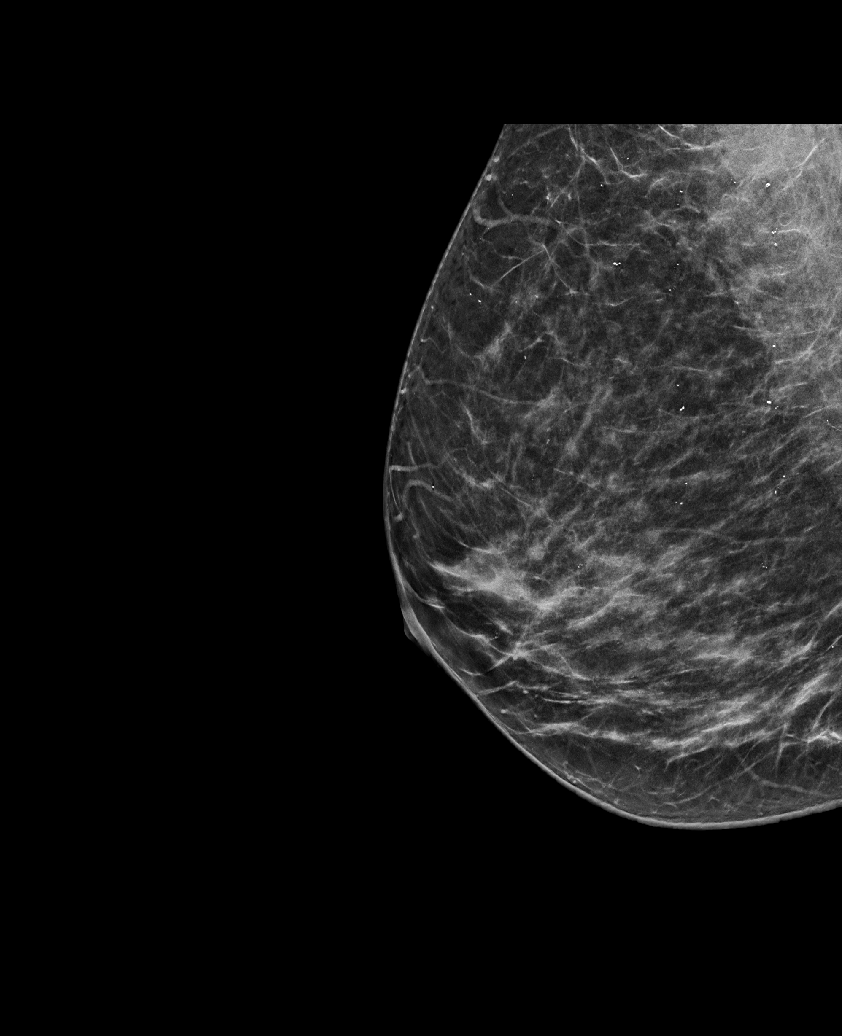

[L MLO tomo · tomo slice 45/89.0]
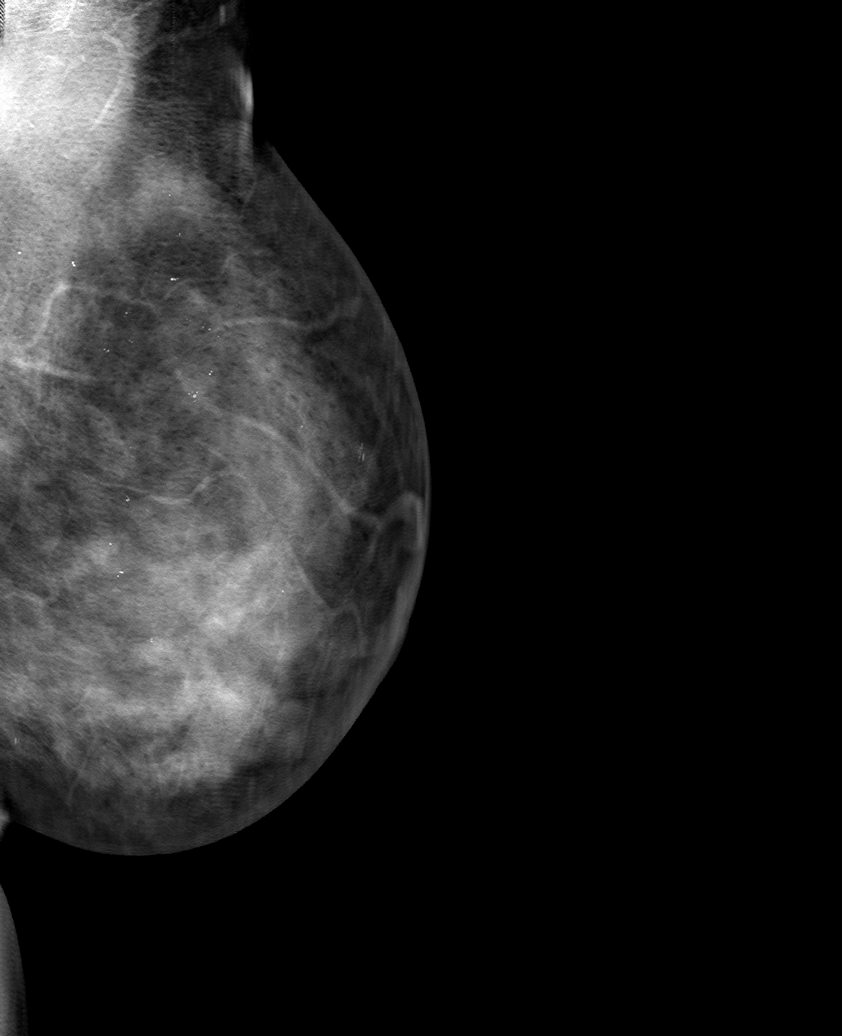

[6 of 25 positions shown; findings below may reference images not displayed]

ACR Breast Density Category c: The breast tissue is heterogeneously
dense, which may obscure small masses.
FINDINGS: There are post lumpectomy changes in the posterior upper outer left
breast and left axilla.

There are no masses or areas of nonsurgical architectural
distortion. There are no suspicious calcifications.

Mammographic images were processed with CAD.
IMPRESSION: 1. No evidence of new or recurrent breast carcinoma.
2. Benign post lumpectomy changes on the left.

RECOMMENDATION:
Diagnostic mammogram in 1 year per standard post lumpectomy
protocol. (Code:[NN])

I have discussed the findings and recommendations with the patient.
If applicable, a reminder letter will be sent to the patient
regarding the next appointment.

BI-RADS CATEGORY  2: Benign.

## 2020-04-03 ENCOUNTER — Encounter (INDEPENDENT_AMBULATORY_CARE_PROVIDER_SITE_OTHER): Payer: Self-pay

## 2020-04-10 ENCOUNTER — Encounter (INDEPENDENT_AMBULATORY_CARE_PROVIDER_SITE_OTHER): Payer: Self-pay

## 2020-04-20 ENCOUNTER — Ambulatory Visit: Payer: BC Managed Care – PPO | Attending: Hematology and Oncology

## 2020-04-20 ENCOUNTER — Other Ambulatory Visit: Payer: Self-pay

## 2020-04-20 DIAGNOSIS — Z483 Aftercare following surgery for neoplasm: Secondary | ICD-10-CM | POA: Insufficient documentation

## 2020-04-20 NOTE — Therapy (Signed)
[place], [place], [address] Phone: [phone]   Fax:  [phone]  Physical Therapy Treatment  Patient Details  Name: Paige Perry MRN: [id number] Date of Birth: 1976-03-15 Referring Provider (PT): Dr Lindi Adie   Encounter Date: 04/20/2020   PT End of Session - 04/20/20 1157    Visit Number 1   screen only   PT Start Time 0935    PT Stop Time 2778    PT Time Calculation (min) 12 min    Activity Tolerance Patient tolerated treatment well    Behavior During Therapy [facility] for tasks assessed/performed           Past Medical History:  Diagnosis Date   Asthma    Cancer ([place])    breast   Family history of breast cancer    Family history of breast cancer    Family history of prostate cancer    Family history of throat cancer    Hypercholesteremia    PONV (postoperative nausea and vomiting)     Past Surgical History:  Procedure Laterality Date   AXILLARY LYMPH NODE DISSECTION Left 07/11/2019   Procedure: LEFT AXILLARY LYMPH NODE DISSECTION;  Surgeon: Donnie Mesa, MD;  Location: [place];  Service: General;  Laterality: Left;   BREAST LUMPECTOMY WITH RADIOACTIVE SEED AND AXILLARY LYMPH NODE DISSECTION Left 06/13/2019   Procedure: LEFT BREAST LUMPECTOMY WITH RADIOACTIVE SEED AND AXILLARY TARGETED LYMPH NODE DISSECTION;  Surgeon: Donnie Mesa, MD;  Location: [place];  Service: General;  Laterality: Left;   FINGER SURGERY     PORTACATH PLACEMENT Right 02/05/2019   Procedure: INSERTION PORT-A-CATH WITH ULTRASOUND;  Surgeon: Donnie Mesa, MD;  Location: [place];  Service: General;  Laterality: Right;   WOUND EXPLORATION Right 06/13/2019   Procedure: RIGHT CHEST WOUND EXPLORATION;  Surgeon: Donnie Mesa, MD;  Location: [place];  Service: General;  Laterality: Right;    There were no vitals filed for this visit.    Subjective Assessment - 04/20/20 0934    Subjective Pt returns for her 3 month L-Dex screen.    Pertinent History Patient was diagnosed on 01/14/2019 with left grade III invasive ductal carcinoma breast cancer. It measures 2.3 cm and is located in the upper outer quadrant. It is ER positive, PR negative, and HER2 postive with a Ki67 of 80%. She has a known positive axillary lymph node. She underwent a lumpectomy with sentinal node disection on 3/18/ 2021 having 2/3 positive nodes, then an  ALND (3/3 more nodes removed) on 07/11/2019. She had radiation from 08/19/2019-10/07/2019.                  L-DEX FLOWSHEETS - 04/20/20 1100      L-DEX LYMPHEDEMA SCREENING   Measurement Type Unilateral    L-DEX MEASUREMENT EXTREMITY Upper Extremity    POSITION  Standing    DOMINANT SIDE Left    At Risk Side Left    BASELINE SCORE (UNILATERAL) -4.9    L-DEX SCORE (UNILATERAL) -2.1    VALUE CHANGE (UNILAT) 2.8                                         Plan - 04/20/20 1206    Clinical Impression Statement Pt returns for her every 3 month L-Dex screen. She is now 9 months out from her first  surgery. Her change from baseline of 2.8 is WNLs so no further treatment is required at this time except to cont every 3 month L-Dex screens for up to 2 years from her first surgery which was a SLNB (she has since had a ALND as they had to go back in for more lymph nodes) which will  be around 06/12/2021.    PT Next Visit Plan Cont every 3 month L-Dex screens    Consulted and Agree with Plan of Care Patient           Patient will benefit from skilled therapeutic intervention in order to improve the following deficits and impairments:     Visit Diagnosis: Aftercare following surgery for neoplasm     Problem List Patient Active Problem List   Diagnosis Date Noted   Port-A-Cath in place 03/26/2019   Genetic testing 02/01/2019   Family history of prostate cancer    Family  history of throat cancer    Family history of breast cancer    Malignant neoplasm of upper-outer quadrant of left breast in female, estrogen receptor positive ([place]) 01/18/2019   Other allergic rhinitis 12/03/2014    Otelia Limes, PTA 04/20/2020, 12:27 PM  [place], [place], [address] Phone: [phone]   Fax:  [phone]  Name: Paige Perry MRN: [id number] Date of Birth: 11-10-1975

## 2020-04-24 ENCOUNTER — Encounter (INDEPENDENT_AMBULATORY_CARE_PROVIDER_SITE_OTHER): Payer: Self-pay

## 2020-05-01 ENCOUNTER — Encounter (INDEPENDENT_AMBULATORY_CARE_PROVIDER_SITE_OTHER): Payer: Self-pay

## 2020-05-11 ENCOUNTER — Telehealth: Payer: Self-pay | Admitting: Hematology and Oncology

## 2020-05-11 NOTE — Progress Notes (Signed)
HEMATOLOGY-ONCOLOGY MYCHART VIDEO VISIT PROGRESS NOTE  I connected with Paige Perry on 05/12/2020 at  3:00 PM EST by MyChart video conference and verified that I am speaking with the correct person using two identifiers.  I discussed the limitations, risks, security and privacy concerns of performing an evaluation and management service by MyChart and the availability of in person appointments.  I also discussed with the patient that there may be a patient responsible charge related to this service. The patient expressed understanding and agreed to proceed.   CHIEF COMPLIANT: Follow-up of left breast cancer on tamoxifen   INTERVAL HISTORY: Paige Perry is a 45 y.o. female with above-mentioned history of left breast cancer treated withneoadjuvant chemotherapy, left lumpectomy, radiation, and is currently on antiestrogen therapy with tamoxifen. Mammogram on 04/02/20 showed no evidence of malignancy bilaterally. She presents todayfor follow-up.    Oncology History  Malignant neoplasm of upper-outer quadrant of left breast in female, estrogen receptor positive ([place])  01/18/2019 Initial Diagnosis   Patient palpated a left breast lump for 2 weeks. Mammogram showed a 2.3cm left breast mass at the 12 o'clock position, with 1 enlarged left axillary lymph node measuring 3.5cm. 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   01/23/2019 Cancer Staging   Staging form: Breast, AJCC 8th Edition - Clinical stage from 01/23/2019: Stage IIA (cT2, cN1(f), cM0, G2, ER+, PR-, HER2+)   02/01/2019 Genetic Testing   Negative genetic testing. No pathogenic variants identified on the [facility] Common Hereditary Cancers Panel. The Common Hereditary Cancers Panel offered by [facility] includes sequencing and/or deletion duplication testing of the following 48 genes: APC, ATM, AXIN2, BARD1, BMPR1A, BRCA1, BRCA2, BRIP1, CDH1, CDKN2A (p14ARF), CDKN2A (p16INK4a), CKD4, CHEK2, CTNNA1, DICER1, EPCAM  (Deletion/duplication testing only), GREM1 (promoter region deletion/duplication testing only), KIT, MEN1, MLH1, MSH2, MSH3, MSH6, MUTYH, NBN, NF1, NHTL1, PALB2, PDGFRA, PMS2, POLD1, POLE, PTEN, RAD50, RAD51C, RAD51D, RNF43, SDHB, SDHC, SDHD, SMAD4, SMARCA4. STK11, TP53, TSC1, TSC2, and VHL.  The following genes were evaluated for sequence changes only: SDHA and HOXB13 c.251G>A variant only. The report date is 02/01/2019.    02/13/2019 - 05/28/2019 Chemotherapy   dexamethasone (DECADRON) 4 MG tablet, 4 mg (100 % of original dose 4 mg), Oral, Daily, 1 of 1 cycle, Start date: 01/23/2019, End date: 05/28/2019. Dose modification: 4 mg (original dose 4 mg, Cycle 0)  palonosetron (ALOXI) injection 0.25 mg, 0.25 mg, Intravenous,  Once, 6 of 6 cycles. Administration: 0.25 mg (02/13/2019), 0.25 mg (03/04/2019), 0.25 mg (05/07/2019), 0.25 mg (05/28/2019), 0.25 mg (03/26/2019), 0.25 mg (04/18/2019)  pegfilgrastim-jmdb (FULPHILA) injection 6 mg, 6 mg, Subcutaneous,  Once, 6 of 6 cycles. Administration: 6 mg (02/15/2019), 6 mg (03/06/2019), 6 mg (05/09/2019), 6 mg (05/30/2019), 6 mg (03/28/2019), 6 mg (04/20/2019)  CARBOplatin (PARAPLATIN) 700 mg in sodium chloride 0.9 % 250 mL chemo infusion, 700 mg (100 % of original dose 700 mg), Intravenous,  Once, 6 of 6 cycles. Dose modification: 700 mg (original dose 700 mg, Cycle 1). Administration: 700 mg (02/13/2019), 700 mg (03/04/2019), 700 mg (05/07/2019), 700 mg (05/28/2019), 700 mg (03/26/2019), 700 mg (04/18/2019)  DOCEtaxel (TAXOTERE) 140 mg in sodium chloride 0.9 % 250 mL chemo infusion, 75 mg/m2 = 140 mg, Intravenous,  Once, 6 of 6 cycles. Dose modification: 65 mg/m2 (original dose 75 mg/m2, Cycle 5, Reason: Dose not tolerated). Administration: 140 mg (02/13/2019), 140 mg (03/04/2019), 120 mg (05/07/2019), 120 mg (05/28/2019), 140 mg (03/26/2019), 140 mg (04/18/2019)  pertuzumab (PERJETA) 420 mg in sodium chloride  0.9 % 250 mL chemo infusion, 420 mg (100 % of original dose 420 mg),  Intravenous, Once, 3 of 3 cycles. Dose modification: 420 mg (original dose 420 mg, Cycle 1, Reason: Provider Judgment). Administration: 420 mg (02/13/2019), 420 mg (03/04/2019), 420 mg (03/26/2019)  fosaprepitant (EMEND) 150 mg  dexamethasone (DECADRON) 12 mg in sodium chloride 0.9 % 145 mL IVPB, , Intravenous,  Once, 6 of 6 cycles. Administration:  (02/13/2019),  (03/04/2019),  (05/07/2019),  (05/28/2019),  (03/26/2019),  (04/18/2019)  trastuzumab-dkst (OGIVRI) 651 mg in sodium chloride 0.9 % 250 mL chemo infusion, 8 mg/kg = 651 mg, Intravenous,  Once, 6 of 6 cycles. Administration: 651 mg (02/13/2019), 483 mg (03/04/2019), 483 mg (05/07/2019), 483 mg (05/28/2019), 483 mg (03/26/2019), 483 mg (04/18/2019).   06/13/2019 Surgery   Left lumpectomy ([facility]) [phone]): no residual carcinoma, 2/3 left axillary lymph nodes positive for carcinoma. Lymphovascular space invasion was present.   06/20/2019 Cancer Staging   Staging form: Breast, AJCC 8th Edition - Pathologic stage from 06/20/2019: No Stage Recommended (ypT0, pN1, cM0, ER+, PR-, HER2-)   07/11/2019 Surgery   Axillary lymph node dissection ([facility]) ([id number]): 3/3 lymph nodes positive for cancer   08/19/2019 - 10/07/2019 Radiation Therapy   The patient initially received a dose of 50.4 Gy in 28 fractions to the breast and SCLV region using a 4-field approach. This was delivered using a 3-D conformal technique. The patient then received a boost to the seroma. This delivered an additional 10 Gy in 5 fractions using a 3-field photon boost technique. The total dose was 60.4 Gy.   09/2019 - 09/2029 Anti-estrogen oral therapy   Tamoxifen     Observations/Objective:  There were no vitals filed for this visit. There is no height or weight on file to calculate BMI.  I have reviewed the data as listed CMP Latest Ref Rng & Units 05/28/2019 05/07/2019 04/18/2019  Glucose 70 - 99 mg/dL 107(H) 138(H) 114(H)  BUN 6 - 20 mg/dL 13 14 13   Creatinine 0.44 - 1.00  mg/dL 0.81 0.81 0.78  Sodium 135 - 145 mmol/L 142 139 139  Potassium 3.5 - 5.1 mmol/L 3.5 3.9 3.6  Chloride 98 - 111 mmol/L 108 108 109  CO2 22 - 32 mmol/L 25 21(L) 22  Calcium 8.9 - 10.3 mg/dL 9.1 9.2 8.9  Total Protein 6.5 - 8.1 g/dL 6.7 7.6 7.0  Total Bilirubin 0.3 - 1.2 mg/dL 0.4 0.3 0.3  Alkaline Phos 38 - 126 U/L 86 92 78  AST 15 - 41 U/L 22 16 15   ALT 0 - 44 U/L 34 22 17    Lab Results  Component Value Date   WBC 8.1 05/28/2019   HGB 9.9 (L) 05/28/2019   HCT 29.7 (L) 05/28/2019   MCV 100.0 05/28/2019   PLT 186 05/28/2019   NEUTROABS 4.9 05/28/2019      Assessment Plan:  Malignant neoplasm of upper-outer quadrant of left breast in female, estrogen receptor positive ([place]) 01/18/2019:Patient palpated a left breast lump for 2 weeks. Mammogram showed a 2.3cm left breast mass at the 12 o'clock position, with 1 enlarged left axillary lymph node measuring 3.5cm. 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 T2N1 stage IIa clinical stage  Treatment plan: 1. Neoadjuvant chemotherapy with TCH Perjeta 6 cycles (maintenance anti-HER-2 therapy is not being given because the initial pathology was reread as HER-2 negative.  The final pathology on the  lumpectomy was also HER-2 negative) ER 60% week, PR negative 2. 06/13/2019 left lumpectomy ([facility]): no residual carcinoma, 2/3 left axillary lymph nodes positive for carcinoma. 07/15/2019: ALN D: 3/3 lymph nodes positive.  No extranodal extension 3. Followed by adjuvant radiation therapy started 08/20/2019 No adverse effects from participation in the neuropathy clinical trial SWOG 1714 --------------------------------------------------------------------------------------------------------------------------------------- Treatment plan: Adjuvant antiestrogen therapy is recommended based upon ER positive finding. I  recommended tamoxifen 20 mg daily x10 years  Tamoxifen Toxicities: Denies hot flashes Cycles have returned.  Breast Cancer Surveillance:  Mammograms 04/02/20: Benign Breast density Cat C  RTC in 1 year     I discussed the assessment and treatment plan with the patient. The patient was provided an opportunity to ask questions and all were answered. The patient agreed with the plan and demonstrated an understanding of the instructions. The patient was advised to call back or seek an in-person evaluation if the symptoms worsen or if the condition fails to improve as anticipated.   Total time spent: 20 minutes including face-to-face MyChart video visit time and time spent for planning, charting and coordination of care  Rulon Eisenmenger, MD 05/12/2020   I, Cloyde Reams Dorshimer, am acting as scribe for Nicholas Lose, MD.  I have reviewed the above documentation for accuracy and completeness, and I agree with the above.

## 2020-05-11 NOTE — Telephone Encounter (Signed)
Changed 2/15 to mychart called and spoke with pt, confirmed appt

## 2020-05-11 NOTE — Assessment & Plan Note (Signed)
01/18/2019:Patient palpated a left breast lump for 2 weeks. Mammogram showed a 2.3cm left breast mass at the 12 o'clock position, with 1 enlarged left axillary lymph node measuring 3.5cm. 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 T2N1 stage IIa clinical stage  Treatment plan: 1. Neoadjuvant chemotherapy with TCH Perjeta 6 cycles (maintenance anti-HER-2 therapy is not being given because the initial pathology was reread as HER-2 negative.  The final pathology on the lumpectomy was also HER-2 negative) ER 60% week, PR negative 2. 06/13/2019 left lumpectomy ([facility]): no residual carcinoma, 2/3 left axillary lymph nodes positive for carcinoma. 07/15/2019: ALN D: 3/3 lymph nodes positive.  No extranodal extension 3. Followed by adjuvant radiation therapy started 08/20/2019 No adverse effects from participation in the neuropathy clinical trial SWOG 1714 --------------------------------------------------------------------------------------------------------------------------------------- Treatment plan: Adjuvant antiestrogen therapy is recommended based upon ER positive finding. I recommended tamoxifen 20 mg daily x10 years  Tamoxifen Toxicities:  Breast Cancer Surveillance: Mammograms 04/02/20: Benign Breast density Cat C  RTC in 1 year

## 2020-05-12 ENCOUNTER — Inpatient Hospital Stay: Payer: BC Managed Care – PPO | Attending: Hematology and Oncology | Admitting: Hematology and Oncology

## 2020-05-12 DIAGNOSIS — C50412 Malignant neoplasm of upper-outer quadrant of left female breast: Secondary | ICD-10-CM

## 2020-05-12 DIAGNOSIS — Z170 Estrogen receptor positive status [ER+]: Secondary | ICD-10-CM | POA: Diagnosis not present

## 2020-05-22 ENCOUNTER — Encounter (INDEPENDENT_AMBULATORY_CARE_PROVIDER_SITE_OTHER): Payer: Self-pay

## 2020-05-29 ENCOUNTER — Encounter (INDEPENDENT_AMBULATORY_CARE_PROVIDER_SITE_OTHER): Payer: Self-pay

## 2020-06-05 ENCOUNTER — Encounter (INDEPENDENT_AMBULATORY_CARE_PROVIDER_SITE_OTHER): Payer: Self-pay

## 2020-06-06 ENCOUNTER — Encounter (HOSPITAL_COMMUNITY): Payer: Self-pay

## 2020-06-12 ENCOUNTER — Encounter (INDEPENDENT_AMBULATORY_CARE_PROVIDER_SITE_OTHER): Payer: Self-pay

## 2020-06-19 ENCOUNTER — Encounter (INDEPENDENT_AMBULATORY_CARE_PROVIDER_SITE_OTHER): Payer: Self-pay

## 2020-06-26 ENCOUNTER — Encounter (INDEPENDENT_AMBULATORY_CARE_PROVIDER_SITE_OTHER): Payer: Self-pay

## 2020-07-03 ENCOUNTER — Encounter (INDEPENDENT_AMBULATORY_CARE_PROVIDER_SITE_OTHER): Payer: Self-pay

## 2020-07-10 ENCOUNTER — Encounter (INDEPENDENT_AMBULATORY_CARE_PROVIDER_SITE_OTHER): Payer: Self-pay

## 2020-07-17 ENCOUNTER — Encounter (INDEPENDENT_AMBULATORY_CARE_PROVIDER_SITE_OTHER): Payer: Self-pay

## 2020-07-20 ENCOUNTER — Other Ambulatory Visit: Payer: Self-pay

## 2020-07-20 ENCOUNTER — Ambulatory Visit: Payer: BC Managed Care – PPO | Attending: Hematology and Oncology

## 2020-07-20 DIAGNOSIS — Z483 Aftercare following surgery for neoplasm: Secondary | ICD-10-CM | POA: Insufficient documentation

## 2020-07-20 NOTE — Therapy (Signed)
[place], [place], [address] Phone: [phone]   Fax:  [phone]  Physical Therapy Treatment  Patient Details  Name: Paige Perry MRN: [id number] Date of Birth: [birth year] Referring Provider (PT): Dr Lindi Adie   Encounter Date: 07/20/2020   PT End of Session - 07/20/20 1541    Visit Number 1   screen only   PT Start Time 1536    PT Stop Time 1540    PT Time Calculation (min) 4 min    Activity Tolerance Patient tolerated treatment well    Behavior During Therapy [facility] for tasks assessed/performed           Past Medical History:  Diagnosis Date  . Asthma   . Cancer [facility])    breast  . Family history of breast cancer   . Family history of breast cancer   . Family history of prostate cancer   . Family history of throat cancer   . Hypercholesteremia   . PONV (postoperative nausea and vomiting)     Past Surgical History:  Procedure Laterality Date  . AXILLARY LYMPH NODE DISSECTION Left 07/11/2019   Procedure: LEFT AXILLARY LYMPH NODE DISSECTION;  Surgeon: Donnie Mesa, MD;  Location: [place];  Service: General;  Laterality: Left;  . BREAST LUMPECTOMY WITH RADIOACTIVE SEED AND AXILLARY LYMPH NODE DISSECTION Left 06/13/2019   Procedure: LEFT BREAST LUMPECTOMY WITH RADIOACTIVE SEED AND AXILLARY TARGETED LYMPH NODE DISSECTION;  Surgeon: Donnie Mesa, MD;  Location: [place];  Service: General;  Laterality: Left;  . FINGER SURGERY    . PORTACATH PLACEMENT Right 02/05/2019   Procedure: INSERTION PORT-A-CATH WITH ULTRASOUND;  Surgeon: Donnie Mesa, MD;  Location: [place];  Service: General;  Laterality: Right;  . WOUND EXPLORATION Right 06/13/2019   Procedure: RIGHT CHEST WOUND EXPLORATION;  Surgeon: Donnie Mesa, MD;  Location: [place];  Service: General;  Laterality: Right;    There were no vitals filed for this visit.    Subjective Assessment - 07/20/20 1539    Subjective Pt returns for her 3 month L-Dex screen.    Pertinent History Patient was diagnosed on 01/14/2019 with left grade III invasive ductal carcinoma breast cancer. It measures 2.3 cm and is located in the upper outer quadrant. It is ER positive, PR negative, and HER2 postive with a Ki67 of 80%. She has a known positive axillary lymph node. She underwent a lumpectomy with sentinal node disection on 3/18/ 2021 having 2/3 positive nodes, then an  ALND (3/3 more nodes removed) on 07/11/2019. She had radiation from 08/19/2019-10/07/2019.                  L-DEX FLOWSHEETS - 07/20/20 1500      L-DEX LYMPHEDEMA SCREENING   Measurement Type Unilateral    L-DEX MEASUREMENT EXTREMITY Upper Extremity    POSITION  Standing    DOMINANT SIDE Left    At Risk Side Left    BASELINE SCORE (UNILATERAL) -4.9    L-DEX SCORE (UNILATERAL) -0.5    VALUE CHANGE (UNILAT) 4.4                                         Plan - 07/20/20 1541    Clinical Impression Statement Pt returns for her 3 month L-Dex screen. Her change from baseline of 4.4 is WNLs so no  further treatment is required at this time except to cont every 3 month L-Dex screens which pt is agreeable to.    PT Next Visit Plan Cont every 3 month L-Dex screens for up to 2 years from her SLNB which will be ~06/12/2021.    Consulted and Agree with Plan of Care Patient           Patient will benefit from skilled therapeutic intervention in order to improve the following deficits and impairments:     Visit Diagnosis: Aftercare following surgery for neoplasm     Problem List Patient Active Problem List   Diagnosis Date Noted  . Port-A-Cath in place 03/26/2019  . Genetic testing 02/01/2019  . Family history of prostate cancer   . Family history of throat cancer   . Family history of breast cancer   . Malignant neoplasm of upper-outer quadrant of left breast in female,  estrogen receptor positive ([place]) 01/18/2019  . Other allergic rhinitis 12/03/2014    Otelia Limes, PTA 07/20/2020, 3:44 PM  [place], [place], [address] Phone: [phone]   Fax:  [phone]  Name: Paige Perry MRN: [id number] Date of Birth: January 05, 1976

## 2020-07-24 ENCOUNTER — Encounter (INDEPENDENT_AMBULATORY_CARE_PROVIDER_SITE_OTHER): Payer: Self-pay

## 2020-07-31 ENCOUNTER — Encounter (INDEPENDENT_AMBULATORY_CARE_PROVIDER_SITE_OTHER): Payer: Self-pay

## 2020-08-14 ENCOUNTER — Encounter (INDEPENDENT_AMBULATORY_CARE_PROVIDER_SITE_OTHER): Payer: Self-pay

## 2020-08-21 ENCOUNTER — Encounter (INDEPENDENT_AMBULATORY_CARE_PROVIDER_SITE_OTHER): Payer: Self-pay

## 2020-08-28 ENCOUNTER — Encounter (INDEPENDENT_AMBULATORY_CARE_PROVIDER_SITE_OTHER): Payer: Self-pay

## 2020-09-04 ENCOUNTER — Encounter (INDEPENDENT_AMBULATORY_CARE_PROVIDER_SITE_OTHER): Payer: Self-pay

## 2020-09-11 ENCOUNTER — Encounter (INDEPENDENT_AMBULATORY_CARE_PROVIDER_SITE_OTHER): Payer: Self-pay

## 2020-09-18 ENCOUNTER — Encounter (INDEPENDENT_AMBULATORY_CARE_PROVIDER_SITE_OTHER): Payer: Self-pay

## 2020-09-25 ENCOUNTER — Encounter (INDEPENDENT_AMBULATORY_CARE_PROVIDER_SITE_OTHER): Payer: Self-pay

## 2020-10-02 ENCOUNTER — Encounter (INDEPENDENT_AMBULATORY_CARE_PROVIDER_SITE_OTHER): Payer: Self-pay

## 2020-10-08 ENCOUNTER — Other Ambulatory Visit: Payer: Self-pay | Admitting: Adult Health

## 2020-10-08 DIAGNOSIS — C50412 Malignant neoplasm of upper-outer quadrant of left female breast: Secondary | ICD-10-CM

## 2020-10-09 ENCOUNTER — Encounter (INDEPENDENT_AMBULATORY_CARE_PROVIDER_SITE_OTHER): Payer: Self-pay

## 2020-10-16 ENCOUNTER — Encounter (INDEPENDENT_AMBULATORY_CARE_PROVIDER_SITE_OTHER): Payer: Self-pay

## 2020-10-19 ENCOUNTER — Other Ambulatory Visit: Payer: Self-pay

## 2020-10-19 ENCOUNTER — Ambulatory Visit: Payer: BC Managed Care – PPO | Attending: Surgery

## 2020-10-19 VITALS — Wt 182.2 lb

## 2020-10-19 DIAGNOSIS — Z483 Aftercare following surgery for neoplasm: Secondary | ICD-10-CM | POA: Insufficient documentation

## 2020-10-19 NOTE — Therapy (Signed)
[place], [place], [address] Phone: [phone]   Fax:  [phone]  Physical Therapy Treatment  Patient Details  Name: Paige Perry MRN: [id number] Date of Birth: 1976-01-05 No data recorded  Encounter Date: 10/19/2020   PT End of Session - 10/19/20 0924     Visit Number 1   # unchanged due to screen only   PT Start Time 0922    PT Stop Time 0928    PT Time Calculation (min) 6 min    Activity Tolerance Patient tolerated treatment well    Behavior During Therapy [facility] for tasks assessed/performed             Past Medical History:  Diagnosis Date   Asthma    Cancer ([place])    breast   Family history of breast cancer    Family history of breast cancer    Family history of prostate cancer    Family history of throat cancer    Hypercholesteremia    PONV (postoperative nausea and vomiting)     Past Surgical History:  Procedure Laterality Date   AXILLARY LYMPH NODE DISSECTION Left 07/11/2019   Procedure: LEFT AXILLARY LYMPH NODE DISSECTION;  Surgeon: Donnie Mesa, MD;  Location: [place];  Service: General;  Laterality: Left;   BREAST LUMPECTOMY WITH RADIOACTIVE SEED AND AXILLARY LYMPH NODE DISSECTION Left 06/13/2019   Procedure: LEFT BREAST LUMPECTOMY WITH RADIOACTIVE SEED AND AXILLARY TARGETED LYMPH NODE DISSECTION;  Surgeon: Donnie Mesa, MD;  Location: [place];  Service: General;  Laterality: Left;   FINGER SURGERY     PORTACATH PLACEMENT Right 02/05/2019   Procedure: INSERTION PORT-A-CATH WITH ULTRASOUND;  Surgeon: Donnie Mesa, MD;  Location: [place];  Service: General;  Laterality: Right;   WOUND EXPLORATION Right 06/13/2019   Procedure: RIGHT CHEST WOUND EXPLORATION;  Surgeon: Donnie Mesa, MD;  Location: [place];  Service: General;  Laterality: Right;    Vitals:   10/19/20 0923  Weight: 182 lb 3.2 oz (82.6  kg)     Subjective Assessment - 10/19/20 0924     Subjective Pt returns for her 3 month L-Dex screen.    Pertinent History Patient was diagnosed on 01/14/2019 with left grade III invasive ductal carcinoma breast cancer. It measures 2.3 cm and is located in the upper outer quadrant. It is ER positive, PR negative, and HER2 postive with a Ki67 of 80%. She has a known positive axillary lymph node. She underwent a lumpectomy with sentinal node disection on 3/18/ 2021 having 2/3 positive nodes, then an  ALND (3/3 more nodes removed) on 07/11/2019. She had radiation from 08/19/2019-10/07/2019.                    L-DEX FLOWSHEETS - 10/19/20 0900       L-DEX LYMPHEDEMA SCREENING   Measurement Type Unilateral    L-DEX MEASUREMENT EXTREMITY Upper Extremity    POSITION  Standing    DOMINANT SIDE Left    At Risk Side Left    BASELINE SCORE (UNILATERAL) -4.9    L-DEX SCORE (UNILATERAL) -3    VALUE CHANGE (UNILAT) 1.9                                           Plan - 10/19/20 0928     Clinical Impression Statement  t returns for her 3 month L-Dex screen. Her change from baseline of 1.9 is WNLs so no further treatment is required at this time except to cont every 3 month L-Dex screens which she is agreeable to.    PT Next Visit Plan Cont every 3 month L-Dex screens for up to 2 years from her SLNB which will be ~06/12/2021.    Consulted and Agree with Plan of Care Patient             Patient will benefit from skilled therapeutic intervention in order to improve the following deficits and impairments:     Visit Diagnosis: Aftercare following surgery for neoplasm     Problem List Patient Active Problem List   Diagnosis Date Noted   Port-A-Cath in place 03/26/2019   Genetic testing 02/01/2019   Family history of prostate cancer    Family history of throat cancer    Family history of breast cancer    Malignant neoplasm of upper-outer quadrant of  left breast in female, estrogen receptor positive ([place]) 01/18/2019   Other allergic rhinitis 12/03/2014    Otelia Limes, PTA 10/19/2020, 9:29 AM  [place], [place], [address] Phone: [phone]   Fax:  [phone]  Name: Paige Perry MRN: [id number] Date of Birth: 12/31/1975

## 2020-10-23 ENCOUNTER — Encounter (INDEPENDENT_AMBULATORY_CARE_PROVIDER_SITE_OTHER): Payer: Self-pay

## 2021-01-18 ENCOUNTER — Ambulatory Visit: Payer: BC Managed Care – PPO | Attending: Surgery

## 2021-01-18 ENCOUNTER — Other Ambulatory Visit: Payer: Self-pay

## 2021-01-18 VITALS — Wt 180.5 lb

## 2021-01-18 DIAGNOSIS — Z483 Aftercare following surgery for neoplasm: Secondary | ICD-10-CM | POA: Insufficient documentation

## 2021-01-18 NOTE — Therapy (Signed)
[place] [facility] [place], [place], [address] Phone: [phone]   Fax:  [phone]  Physical Therapy Treatment  Patient Details  Name: Paige Perry MRN: [id number] Date of Birth: 1975-09-21 No data recorded  Encounter Date: 01/18/2021   PT End of Session - 01/18/21 0945     Visit Number 1   # unchanged due to screen only   PT Start Time 0017    PT Stop Time 4944    PT Time Calculation (min) 5 min    Activity Tolerance Patient tolerated treatment well    Behavior During Therapy [facility] for tasks assessed/performed             Past Medical History:  Diagnosis Date   Asthma    Cancer ([place])    breast   Family history of breast cancer    Family history of breast cancer    Family history of prostate cancer    Family history of throat cancer    Hypercholesteremia    PONV (postoperative nausea and vomiting)     Past Surgical History:  Procedure Laterality Date   AXILLARY LYMPH NODE DISSECTION Left 07/11/2019   Procedure: LEFT AXILLARY LYMPH NODE DISSECTION;  Surgeon: Donnie Mesa, MD;  Location: [place];  Service: General;  Laterality: Left;   BREAST LUMPECTOMY WITH RADIOACTIVE SEED AND AXILLARY LYMPH NODE DISSECTION Left 06/13/2019   Procedure: LEFT BREAST LUMPECTOMY WITH RADIOACTIVE SEED AND AXILLARY TARGETED LYMPH NODE DISSECTION;  Surgeon: Donnie Mesa, MD;  Location: [place];  Service: General;  Laterality: Left;   FINGER SURGERY     PORTACATH PLACEMENT Right 02/05/2019   Procedure: INSERTION PORT-A-CATH WITH ULTRASOUND;  Surgeon: Donnie Mesa, MD;  Location: [place];  Service: General;  Laterality: Right;   WOUND EXPLORATION Right 06/13/2019   Procedure: RIGHT CHEST WOUND EXPLORATION;  Surgeon: Donnie Mesa, MD;  Location: [place];  Service: General;  Laterality: Right;    Vitals:   01/18/21 0943  Weight: 180 lb 8 oz  (81.9 kg)     Subjective Assessment - 01/18/21 0943     Subjective Pt returns for her 3 month L-Dex screen. "I've got some nerve damage in my arm from the surgery that my doctor said will just take time to heal."    Pertinent History Patient was diagnosed on 01/14/2019 with left grade III invasive ductal carcinoma breast cancer. It measures 2.3 cm and is located in the upper outer quadrant. It is ER positive, PR negative, and HER2 postive with a Ki67 of 80%. She has a known positive axillary lymph node. She underwent a lumpectomy with sentinal node disection on 3/18/ 2021 having 2/3 positive nodes, then an  ALND (3/3 more nodes removed) on 07/11/2019. She had radiation from 08/19/2019-10/07/2019.                    L-DEX FLOWSHEETS - 01/18/21 0900       L-DEX LYMPHEDEMA SCREENING   Measurement Type Unilateral    L-DEX MEASUREMENT EXTREMITY Upper Extremity    POSITION  Standing    DOMINANT SIDE Left    At Risk Side Left    BASELINE SCORE (UNILATERAL) -4.9    L-DEX SCORE (UNILATERAL) -2.4    VALUE CHANGE (UNILAT) 2.5  Plan - 01/18/21 0946     Clinical Impression Statement Pt returns for her 3 month L-Dex screen. Her change from baseline of 2.5 is WNLs so no further treatment is required at this time except to cont every 3 month L-Dex screens which pt is agreeable to.    PT Next Visit Plan Cont every 3 month L-Dex screens for up to 2 years from her SLNB which will be ~06/12/2021.    Consulted and Agree with Plan of Care Patient             Patient will benefit from skilled therapeutic intervention in order to improve the following deficits and impairments:     Visit Diagnosis: Aftercare following surgery for neoplasm     Problem List Patient Active Problem List   Diagnosis Date Noted   Port-A-Cath in place 03/26/2019   Genetic testing 02/01/2019   Family history of prostate cancer     Family history of throat cancer    Family history of breast cancer    Malignant neoplasm of upper-outer quadrant of left breast in female, estrogen receptor positive ([place]) 01/18/2019   Other allergic rhinitis 12/03/2014    Otelia Limes, PTA 01/18/2021, 9:49 AM  [place] @ [place], [place], [address] Phone: [phone]   Fax:  [phone]  Name: RASHEL OKEEFE MRN: [id number] Date of Birth: 1975/06/01

## 2021-01-25 ENCOUNTER — Encounter: Payer: Self-pay | Admitting: Emergency Medicine

## 2021-01-25 ENCOUNTER — Other Ambulatory Visit: Payer: Self-pay

## 2021-01-25 ENCOUNTER — Emergency Department (HOSPITAL_BASED_OUTPATIENT_CLINIC_OR_DEPARTMENT_OTHER): Payer: BC Managed Care – PPO | Admitting: Radiology

## 2021-01-25 ENCOUNTER — Ambulatory Visit
Admission: EM | Admit: 2021-01-25 | Discharge: 2021-01-25 | Disposition: A | Discharge status: Home / Self Care | Payer: BC Managed Care – PPO

## 2021-01-25 ENCOUNTER — Emergency Department (HOSPITAL_BASED_OUTPATIENT_CLINIC_OR_DEPARTMENT_OTHER)
Admission: EM | Admit: 2021-01-25 | Discharge: 2021-01-25 | Disposition: A | Discharge status: Home / Self Care | Payer: BC Managed Care – PPO | Attending: Emergency Medicine | Admitting: Emergency Medicine

## 2021-01-25 ENCOUNTER — Encounter (HOSPITAL_BASED_OUTPATIENT_CLINIC_OR_DEPARTMENT_OTHER): Payer: Self-pay | Admitting: *Deleted

## 2021-01-25 ENCOUNTER — Emergency Department (HOSPITAL_BASED_OUTPATIENT_CLINIC_OR_DEPARTMENT_OTHER): Payer: BC Managed Care – PPO

## 2021-01-25 DIAGNOSIS — R079 Chest pain, unspecified: Secondary | ICD-10-CM | POA: Insufficient documentation

## 2021-01-25 DIAGNOSIS — Z853 Personal history of malignant neoplasm of breast: Secondary | ICD-10-CM | POA: Insufficient documentation

## 2021-01-25 DIAGNOSIS — R590 Localized enlarged lymph nodes: Secondary | ICD-10-CM | POA: Insufficient documentation

## 2021-01-25 LAB — URINALYSIS, ROUTINE W REFLEX MICROSCOPIC
Bilirubin Urine: NEGATIVE
Glucose, UA: NEGATIVE mg/dL
Hgb urine dipstick: NEGATIVE
Ketones, ur: 15 mg/dL — AB
Nitrite: NEGATIVE
Specific Gravity, Urine: 1.028 (ref 1.005–1.030)
pH: 5.5 (ref 5.0–8.0)

## 2021-01-25 LAB — COMPREHENSIVE METABOLIC PANEL
ALT: 30 U/L (ref 0–44)
AST: 29 U/L (ref 15–41)
Albumin: 4 g/dL (ref 3.5–5.0)
Alkaline Phosphatase: 81 U/L (ref 38–126)
Anion gap: 10 (ref 5–15)
BUN: 10 mg/dL (ref 6–20)
CO2: 27 mmol/L (ref 22–32)
Calcium: 9.8 mg/dL (ref 8.9–10.3)
Chloride: 100 mmol/L (ref 98–111)
Creatinine, Ser: 0.9 mg/dL (ref 0.44–1.00)
GFR, Estimated: >60 mL/min (ref >60)
Glucose, Bld: 96 mg/dL (ref 70–99)
Potassium: 3.5 mmol/L (ref 3.5–5.1)
Sodium: 137 mmol/L (ref 135–145)
Total Bilirubin: 0.6 mg/dL (ref 0.3–1.2)
Total Protein: 9 g/dL — ABNORMAL HIGH (ref 6.5–8.1)

## 2021-01-25 LAB — TROPONIN I (HIGH SENSITIVITY): Troponin I (High Sensitivity): <2 ng/L (ref <18)

## 2021-01-25 LAB — CBC
HCT: 39.6 % (ref 36.0–46.0)
Hemoglobin: 13 g/dL (ref 12.0–15.0)
MCH: 29.4 pg (ref 26.0–34.0)
MCHC: 32.8 g/dL (ref 30.0–36.0)
MCV: 89.6 fL (ref 80.0–100.0)
Platelets: 311 10*3/uL (ref 150–400)
RBC: 4.42 MIL/uL (ref 3.87–5.11)
RDW: 11.7 % (ref 11.5–15.5)
WBC: 6 10*3/uL (ref 4.0–10.5)
nRBC: 0 % (ref 0.0–0.2)

## 2021-01-25 LAB — LIPASE, BLOOD: Lipase: 18 U/L (ref 11–51)

## 2021-01-25 LAB — PREGNANCY, URINE: Preg Test, Ur: NEGATIVE

## 2021-01-25 LAB — D-DIMER, QUANTITATIVE: D-Dimer, Quant: 1.81 ug/mL-FEU — ABNORMAL HIGH (ref 0.00–0.50)

## 2021-01-25 IMAGING — DX DG CHEST 2V
2 series · 2 of 2 positions shown · non-contrast
Comparison: Chest x-ray [DATE]

CLINICAL DATA: Chest pain

EXAM:
CHEST - 2 VIEW

[chest pa]
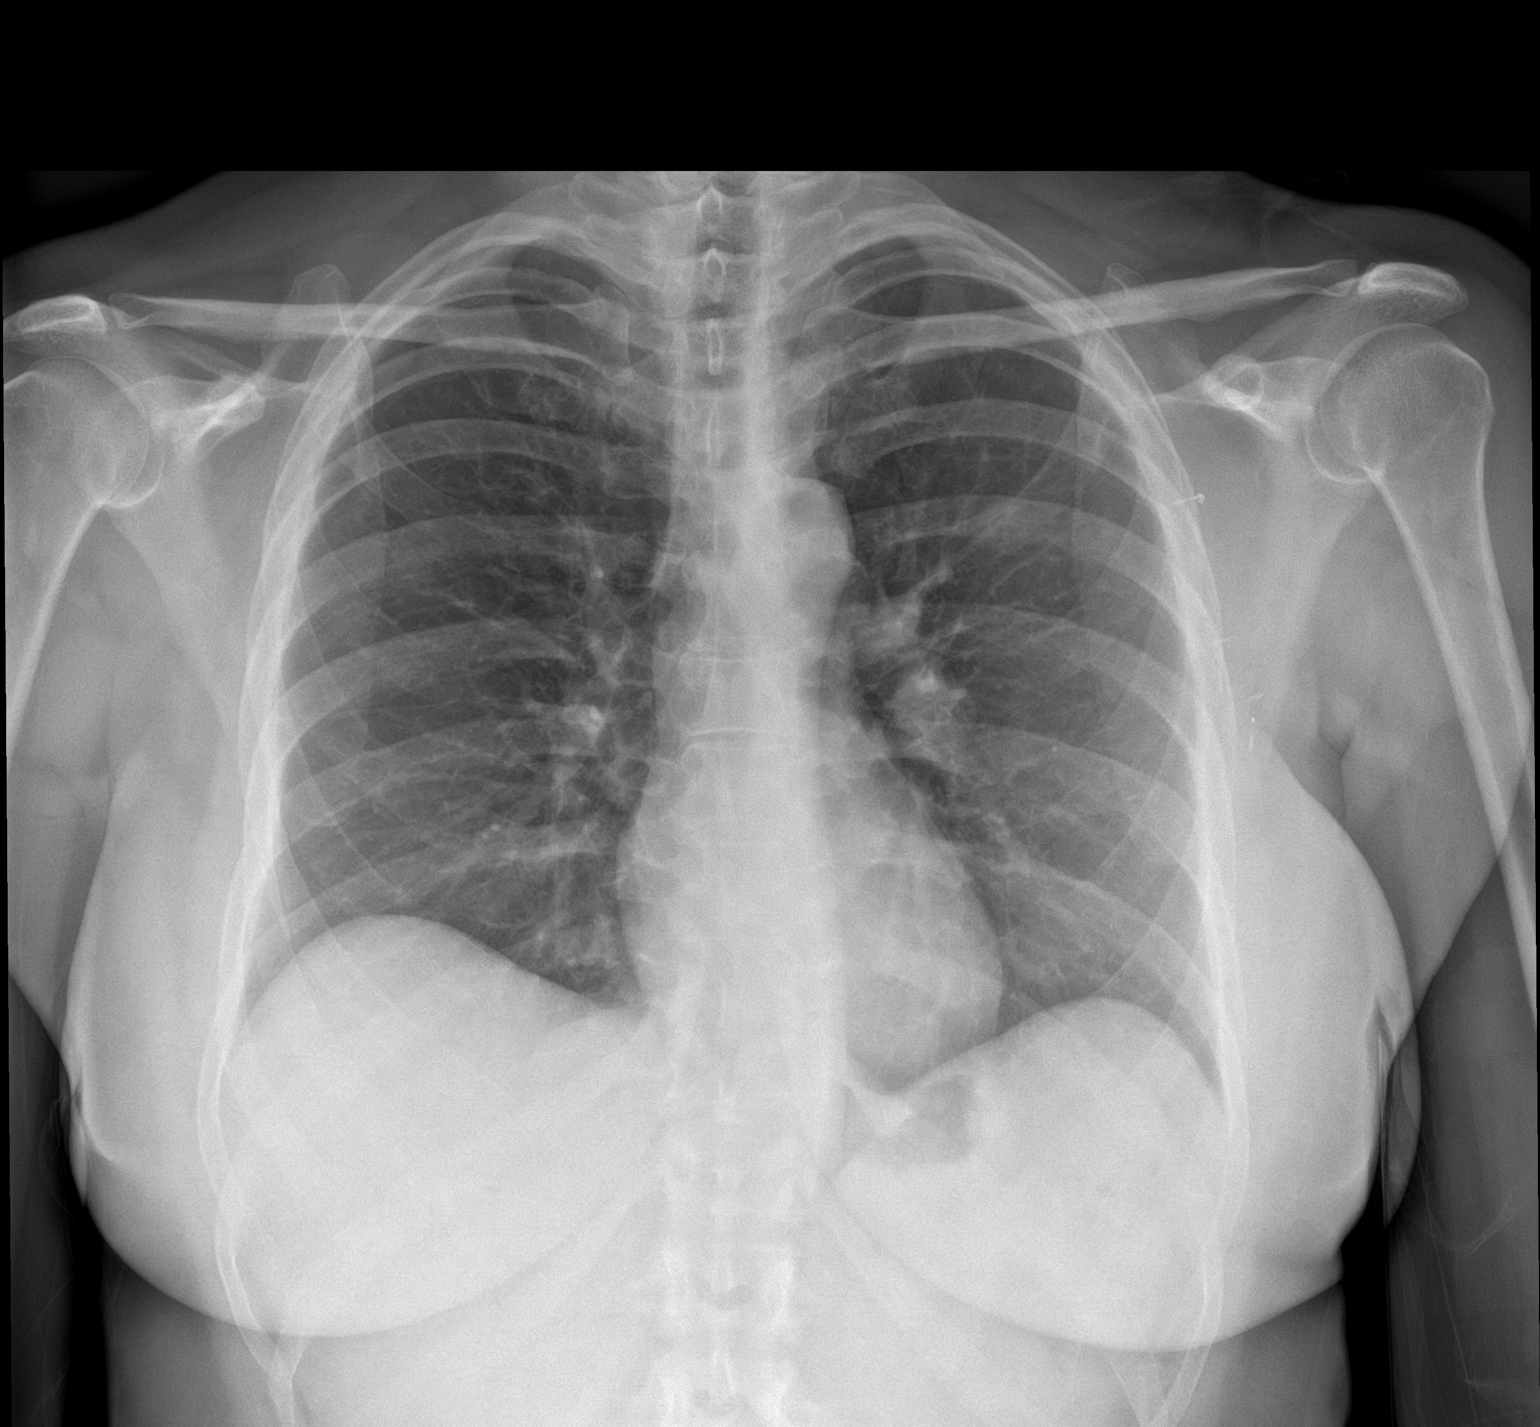

[chest lat]
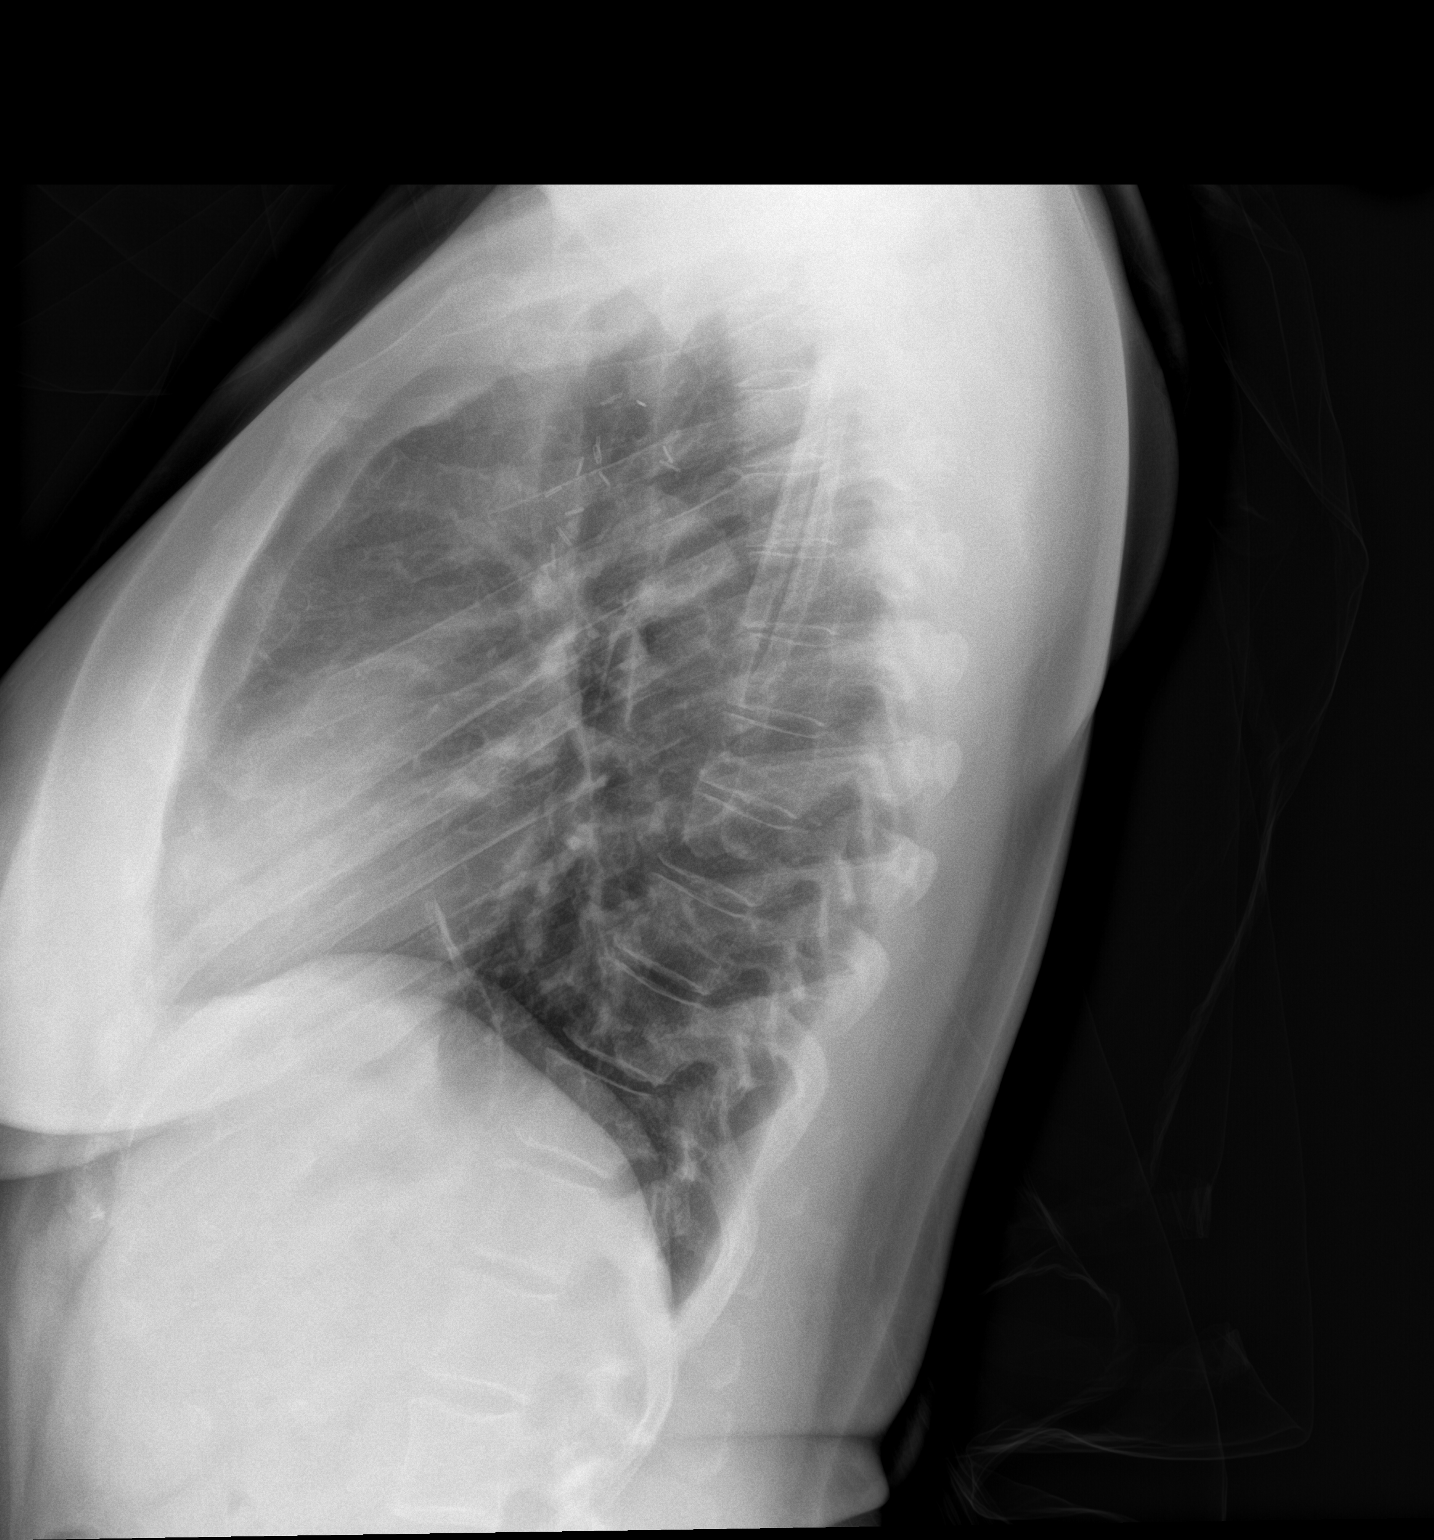

[2 of 2 positions shown; findings below may reference images not displayed]

FINDINGS: Heart size and mediastinal contours are within normal limits. No
suspicious pulmonary opacities identified.

No pleural effusion or pneumothorax visualized. Surgical clips in
the left axillary region.

No acute osseous abnormality appreciated.
IMPRESSION: No acute intrathoracic process identified.

## 2021-01-25 IMAGING — CT CT ANGIO CHEST
2 of 7 series · 18 of 46 positions shown · IV contrast (omnipaque)
Comparison: Chest radiography same day

CLINICAL DATA: Pulmonary emboli suspected. Low/intermediate
probability. Positive D-dimer. Right chest and abdominal pain.

EXAM:
CT ANGIOGRAPHY CHEST WITH CONTRAST
TECHNIQUE: Multidetector CT imaging of the chest was performed using the
standard protocol during bolus administration of intravenous
contrast. Multiplanar CT image reconstructions and MIPs were
obtained to evaluate the vascular anatomy.
CONTRAST:  75mL OMNIPAQUE IOHEXOL 350 MG/ML SOLN

[Series 5: pe axial thins · axial · 0.68mm/px · z∈[+124,+354]mm · 15 of 264 slices shown]
[im 17/264  lung]
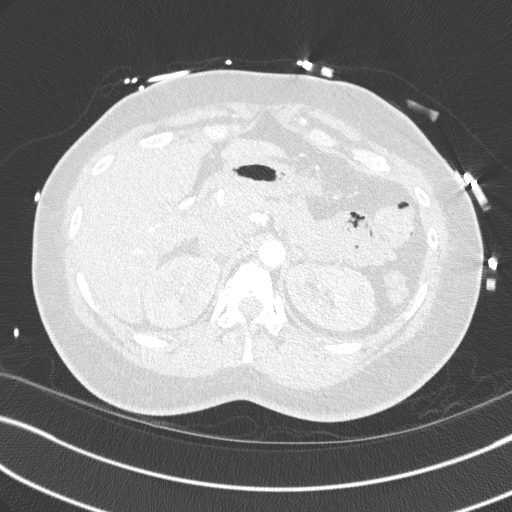
[im 33/264  soft-tissue]
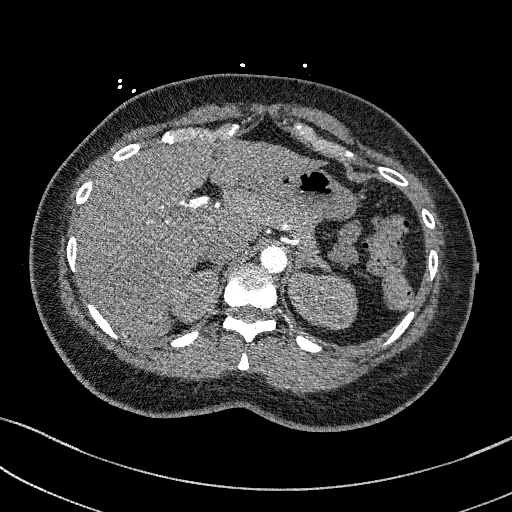
[im 50/264  lung]
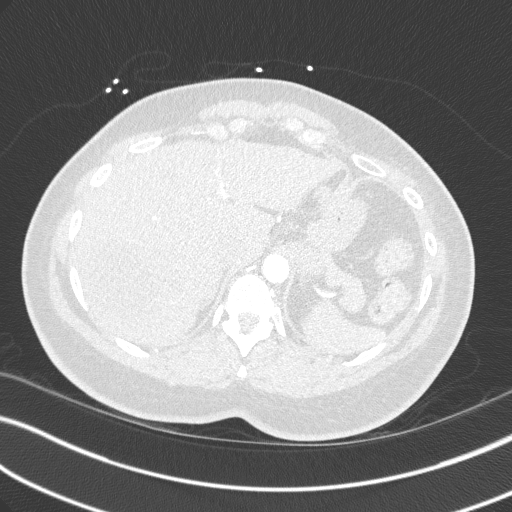
[im 66/264  soft-tissue]
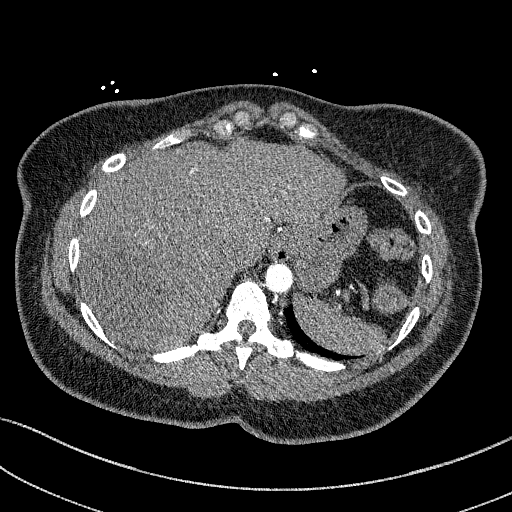
[im 83/264  lung]
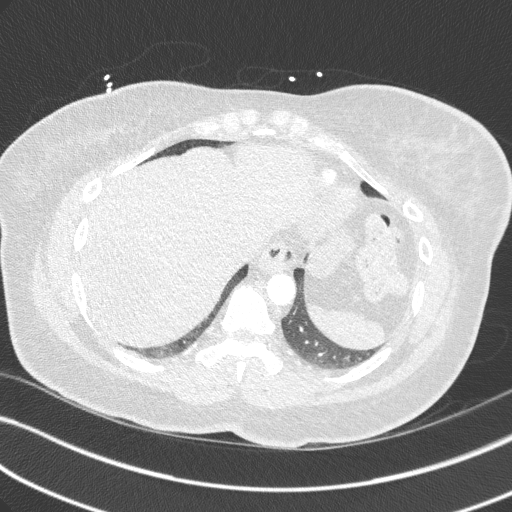
[im 99/264  soft-tissue]
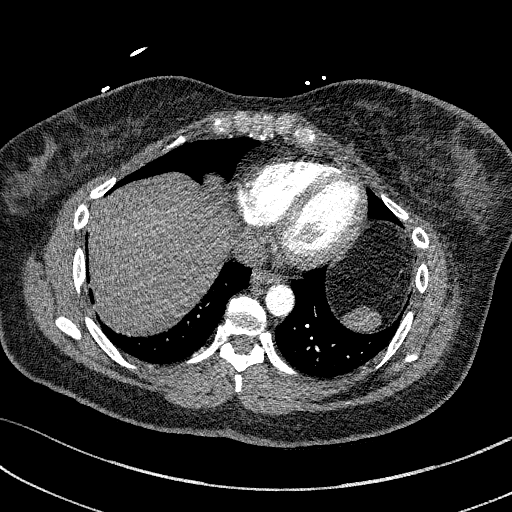
[im 116/264  lung]
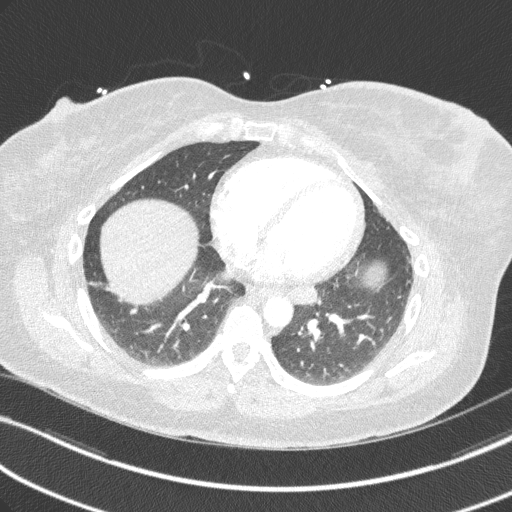
[im 132/264  soft-tissue]
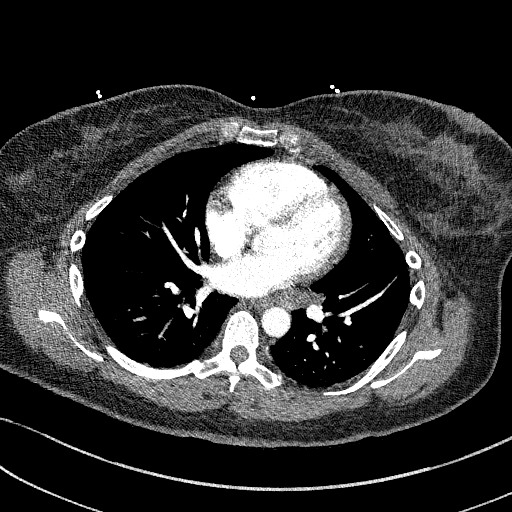
[im 148/264  lung]
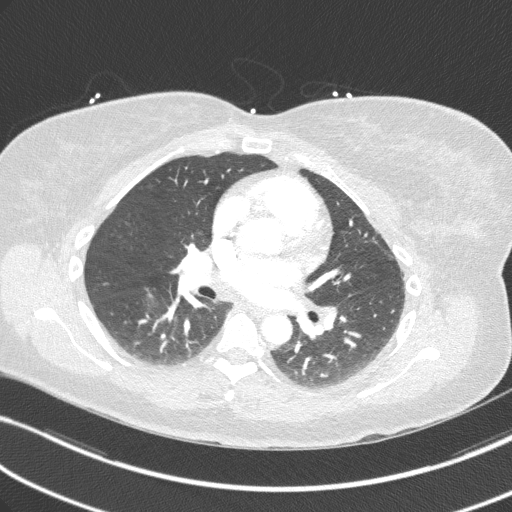
[im 165/264  soft-tissue]
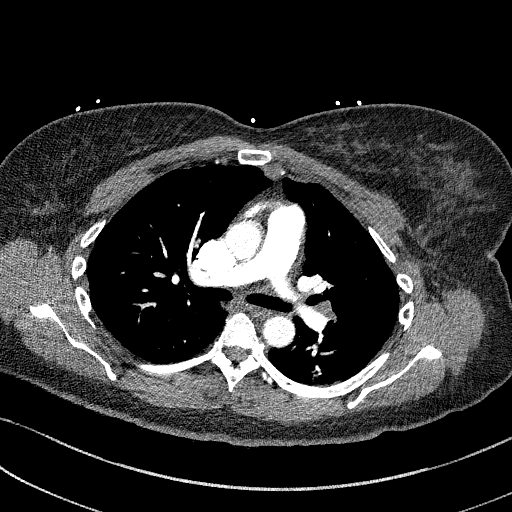
[im 181/264  lung]
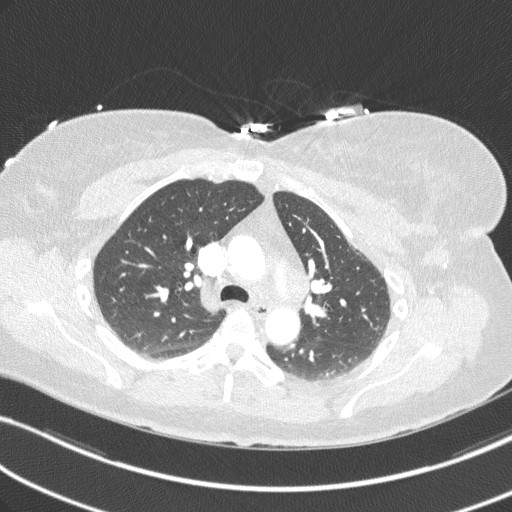
[im 198/264  soft-tissue]
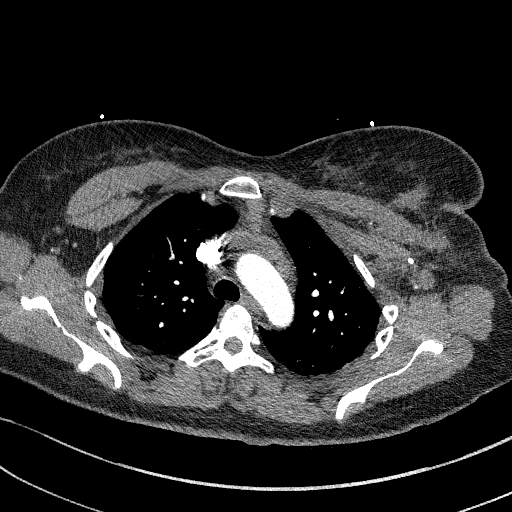
[im 214/264  lung]
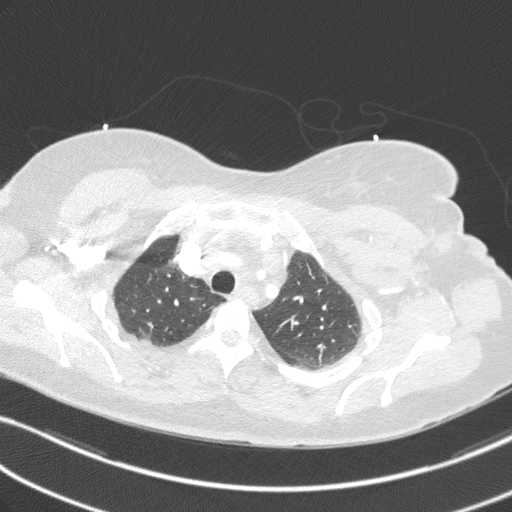
[im 231/264  soft-tissue]
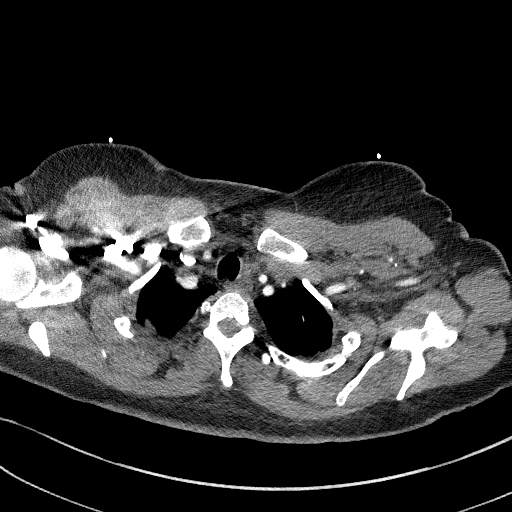
[im 247/264  lung]
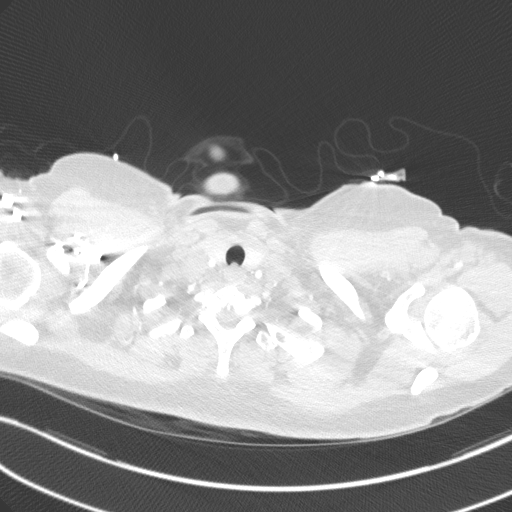

[Series 7: cor soft · coronal · 0.55mm/px · 3 of 134 slices shown]
[im 34/134  soft-tissue]
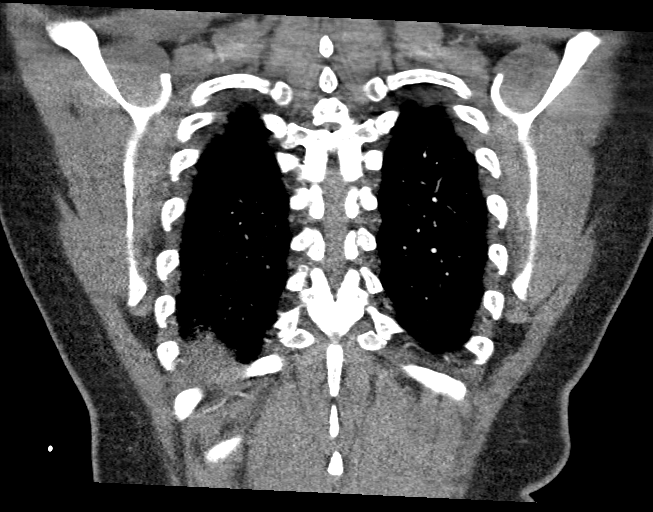
[im 67/134  soft-tissue]
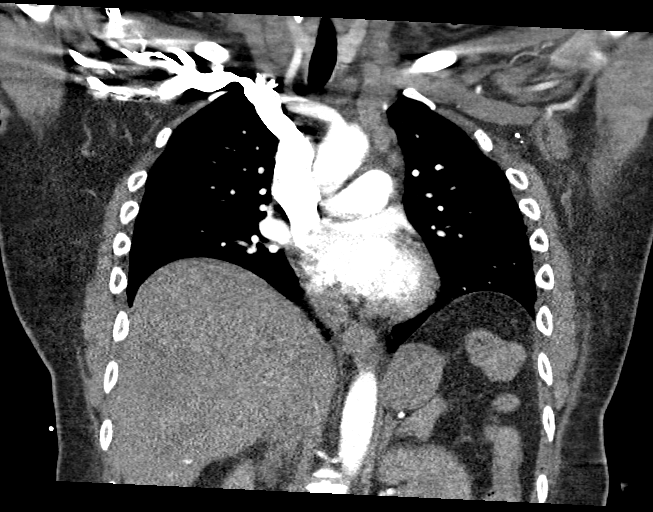
[im 100/134  soft-tissue]
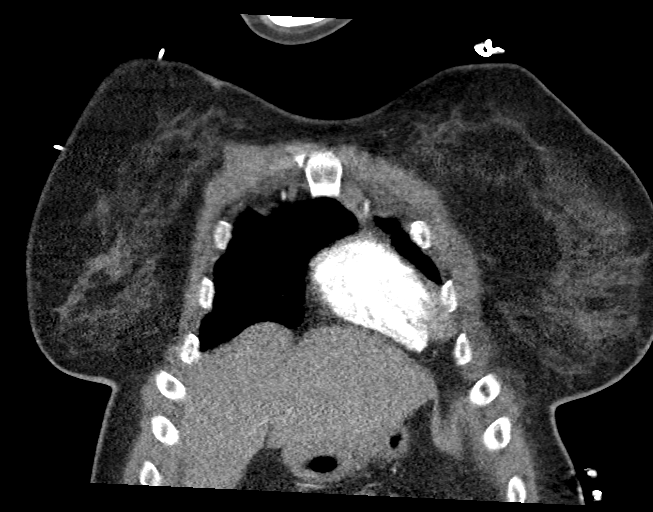

[18 of 46 positions shown; findings below may reference images not displayed]

FINDINGS: Cardiovascular: Heart appears normal. No coronary artery
calcification. No aortic atherosclerotic calcification. Pulmonary
arterial opacification is good. There are no pulmonary emboli.

Mediastinum/Nodes: Bilateral hilar and mediastinal lymphadenopathy.
Largest aortopulmonary window node measures 16 x 21 mm. Patient also
appears to have a density in the left axillary tail the breast or
axilla measuring 2.3 cm in size. Question the presence of a left
supraclavicular lymph node as well, image 11.

Lungs/Pleura: No pleural effusion. The lungs are clear. No pulmonary
mass or nodule. No infiltrate or collapse.

Upper Abdomen: No upper abdominal pathology is visible.

Musculoskeletal: No evidence of fracture or focal bone lesion.

Review of the MIP images confirms the above findings.
IMPRESSION: No pulmonary emboli. No evidence of pneumonia, collapse or effusion.
No focal pulmonary mass.

Lymphadenopathy in the mediastinal and hilar regions. Question mass
versus lymph node in the left axillary tail of the breast, separate
from the previous biopsy changes. Question left supraclavicular
lymph node. Findings are worrisome for metastatic disease.

## 2021-01-25 MED ORDER — IBUPROFEN 400 MG PO TABS
600.0000 mg | ORAL_TABLET | Freq: Once | ORAL | Status: AC
  Administered 2021-01-25: 600 mg via ORAL
  Filled 2021-01-25: qty 1

## 2021-01-25 MED ORDER — IOHEXOL 350 MG/ML SOLN
75.0000 mL | Freq: Once | INTRAVENOUS | Status: AC | PRN
  Administered 2021-01-25: 75 mL via INTRAVENOUS

## 2021-01-25 MED ORDER — SODIUM CHLORIDE 0.9 % IV BOLUS
1000.0000 mL | Freq: Once | INTRAVENOUS | Status: AC
  Administered 2021-01-25: 1000 mL via INTRAVENOUS

## 2021-01-25 NOTE — Discharge Instructions (Signed)
Please go to the emergency department as soon as you leave urgent care for further evaluation and management. ?

## 2021-01-25 NOTE — ED Provider Notes (Signed)
[facility] Provider Note   CSN: [id number] Arrival date & time: 01/25/21  8657     History Chief Complaint  Patient presents with   Abdominal Pain   RUQ Pain    Paige Perry is a 45 y.o. female.  HPI 46 year old female presents with right-sided lower chest pain.  It has been ongoing for about a week.  At first it was really severe and any type of coughing or sneezing or certain movements would make it worse.  It has improved to the point that it is now about a 3 out of 10 only when she takes a deep breath.  Otherwise there is no real pain.  She has not had a new cough.  She denies shortness of breath.  No leg swelling, recent travel, or history of DVT.  She is not on birth control.  She has a history of breast cancer but states that she has no longer getting active treatment.  Has tried some aspirin that seemed to partially help.  Went to urgent care and she was sent here instead. No abdominal pain or pain with eating.  Past Medical History:  Diagnosis Date   Asthma    Cancer ([place])    breast   Family history of breast cancer    Family history of breast cancer    Family history of prostate cancer    Family history of throat cancer    Hypercholesteremia    PONV (postoperative nausea and vomiting)     Patient Active Problem List   Diagnosis Date Noted   Port-A-Cath in place 03/26/2019   Genetic testing 02/01/2019   Family history of prostate cancer    Family history of throat cancer    Family history of breast cancer    Malignant neoplasm of upper-outer quadrant of left breast in female, estrogen receptor positive ([place]) 01/18/2019   Other allergic rhinitis 12/03/2014    Past Surgical History:  Procedure Laterality Date   AXILLARY LYMPH NODE DISSECTION Left 07/11/2019   Procedure: LEFT AXILLARY LYMPH NODE DISSECTION;  Surgeon: Donnie Mesa, MD;  Location: [facility];  Service: General;  Laterality: Left;   BREAST LUMPECTOMY  WITH RADIOACTIVE SEED AND AXILLARY LYMPH NODE DISSECTION Left 06/13/2019   Procedure: LEFT BREAST LUMPECTOMY WITH RADIOACTIVE SEED AND AXILLARY TARGETED LYMPH NODE DISSECTION;  Surgeon: Donnie Mesa, MD;  Location: [facility];  Service: General;  Laterality: Left;   FINGER SURGERY     PORTACATH PLACEMENT Right 02/05/2019   Procedure: INSERTION PORT-A-CATH WITH ULTRASOUND;  Surgeon: Donnie Mesa, MD;  Location: [facility];  Service: General;  Laterality: Right;   WOUND EXPLORATION Right 06/13/2019   Procedure: RIGHT CHEST WOUND EXPLORATION;  Surgeon: Donnie Mesa, MD;  Location: [facility];  Service: General;  Laterality: Right;     OB History   No obstetric history on file.     Family History  Problem Relation Age of Onset   Hypertension Mother    Heart failure Mother    Breast cancer Maternal Aunt        dx 80s   Breast cancer Paternal Aunt    Prostate cancer Father 65   Throat cancer Cousin     Social History   Tobacco Use   Smoking status: Never   Smokeless tobacco: Never  Vaping Use   Vaping Use: Never used  Substance Use Topics   Alcohol use: Yes    Comment: occasionally   Drug use:  No    Home Medications Prior to Admission medications   Medication Sig Start Date End Date Taking? Authorizing Provider  tamoxifen (NOLVADEX) 20 MG tablet TAKE 1 TABLET BY MOUTH EVERY DAY 10/08/20   Causey, Charlestine Massed, NP    Allergies    Latex, Other, and Adhesive [tape]  Review of Systems   Review of Systems  Constitutional:  Negative for fever.  Respiratory:  Negative for cough and shortness of breath.   Cardiovascular:  Positive for chest pain. Negative for leg swelling.  Gastrointestinal:  Negative for abdominal pain, diarrhea and vomiting.  Musculoskeletal:  Positive for back pain.  All other systems reviewed and are negative.  Physical Exam Updated Vital Signs BP 115/85   Pulse 63   Temp 98.9 F (37.2 C) (Oral)    Resp 19   Ht 5\' 3"  (1.6 m)   Wt 82.1 kg   SpO2 100%   BMI 32.06 kg/m   Physical Exam Vitals and nursing note reviewed.  Constitutional:      Appearance: She is well-developed.  HENT:     Head: Normocephalic and atraumatic.     Right Ear: External ear normal.     Left Ear: External ear normal.     Nose: Nose normal.  Eyes:     General:        Right eye: No discharge.        Left eye: No discharge.  Cardiovascular:     Rate and Rhythm: Regular rhythm. Tachycardia present.     Heart sounds: Normal heart sounds.  Pulmonary:     Effort: Pulmonary effort is normal.     Breath sounds: Normal breath sounds.  Chest:     Chest wall: No tenderness.  Abdominal:     Palpations: Abdomen is soft.     Tenderness: There is no abdominal tenderness. Negative signs include Murphy's sign.  Skin:    General: Skin is warm and dry.     Findings: No rash.  Neurological:     Mental Status: She is alert.  Psychiatric:        Mood and Affect: Mood is not anxious.    ED Results / Procedures / Treatments   Labs (all labs ordered are listed, but only abnormal results are displayed) Labs Reviewed  COMPREHENSIVE METABOLIC PANEL - Abnormal; Notable for the following components:      Result Value   Total Protein 9.0 (*)    All other components within normal limits  URINALYSIS, ROUTINE W REFLEX MICROSCOPIC - Abnormal; Notable for the following components:   Ketones, ur 15 (*)    Protein, ur TRACE (*)    Leukocytes,Ua LARGE (*)    Bacteria, UA RARE (*)    All other components within normal limits  D-DIMER, QUANTITATIVE - Abnormal; Notable for the following components:   D-Dimer, Quant 1.81 (*)    All other components within normal limits  LIPASE, BLOOD  CBC  PREGNANCY, URINE  TROPONIN I (HIGH SENSITIVITY)    EKG EKG Interpretation  Date/Time:  Monday January 25 2021 11:48:47 EDT Ventricular Rate:  77 PR Interval:  144 QRS Duration: 77 QT Interval:  363 QTC Calculation: 411 R  Axis:   49 Text Interpretation: Sinus rhythm Low voltage, precordial leads Borderline T wave abnormalities No old tracing to compare Confirmed by Sherwood Gambler [phone]) on 01/25/2021 11:57:32 AM  Radiology DG Chest 2 View  Result Date: 01/25/2021 CLINICAL DATA:  Chest pain EXAM: CHEST - 2 VIEW COMPARISON:  Chest x-ray  02/05/2019 FINDINGS: Heart size and mediastinal contours are within normal limits. No suspicious pulmonary opacities identified. No pleural effusion or pneumothorax visualized. Surgical clips in the left axillary region. No acute osseous abnormality appreciated. IMPRESSION: No acute intrathoracic process identified. Electronically Signed   By: Ofilia Neas M.D.   On: 01/25/2021 10:50   CT Angio Chest PE W and/or Wo Contrast  Result Date: 01/25/2021 CLINICAL DATA:  Pulmonary emboli suspected. Low/intermediate probability. Positive D-dimer. Right chest and abdominal pain. EXAM: CT ANGIOGRAPHY CHEST WITH CONTRAST TECHNIQUE: Multidetector CT imaging of the chest was performed using the standard protocol during bolus administration of intravenous contrast. Multiplanar CT image reconstructions and MIPs were obtained to evaluate the vascular anatomy. CONTRAST:  54mL OMNIPAQUE IOHEXOL 350 MG/ML SOLN COMPARISON:  Chest radiography same day FINDINGS: Cardiovascular: Heart appears normal. No coronary artery calcification. No aortic atherosclerotic calcification. Pulmonary arterial opacification is good. There are no pulmonary emboli. Mediastinum/Nodes: Bilateral hilar and mediastinal lymphadenopathy. Largest aortopulmonary window node measures 16 x 21 mm. Patient also appears to have a density in the left axillary tail the breast or axilla measuring 2.3 cm in size. Question the presence of a left supraclavicular lymph node as well, image 11. Lungs/Pleura: No pleural effusion. The lungs are clear. No pulmonary mass or nodule. No infiltrate or collapse. Upper Abdomen: No upper abdominal pathology  is visible. Musculoskeletal: No evidence of fracture or focal bone lesion. Review of the MIP images confirms the above findings. IMPRESSION: No pulmonary emboli. No evidence of pneumonia, collapse or effusion. No focal pulmonary mass. Lymphadenopathy in the mediastinal and hilar regions. Question mass versus lymph node in the left axillary tail of the breast, separate from the previous biopsy changes. Question left supraclavicular lymph node. Findings are worrisome for metastatic disease. Electronically Signed   By: Nelson Chimes M.D.   On: 01/25/2021 13:21    Procedures Procedures   Medications Ordered in ED Medications  ibuprofen (ADVIL) tablet 600 mg (600 mg Oral Given 01/25/21 1143)  sodium chloride 0.9 % bolus 1,000 mL (1,000 mLs Intravenous New Bag/Given 01/25/21 1138)  iohexol (OMNIPAQUE) 350 MG/ML injection 75 mL (75 mLs Intravenous Contrast Given 01/25/21 1304)    ED Course  I have reviewed the triage vital signs and the nursing notes.  Pertinent labs & imaging results that were available during my care of the patient were reviewed by me and considered in my medical decision making (see chart for details).    MDM Rules/Calculators/A&P                           Work-up obtained given nonspecific right lower chest pain.  There is no abdominal component.  Abdominal exam is benign and LFTs are unremarkable.  Highly doubt this is biliary.  CT obtained given elevated D-dimer.  No PE, pneumonia, effusion, etc.  However there is lymphadenopathy and questionable mass versus lymph nodes that are abnormal.  She has been following with Dr. Lindi Adie, and so I will send Dr. Lindi Adie an epic message and instructed her to call their office today for close outpatient follow-up and work-up.  Troponin is negative after 1 week of symptoms.  Offered muscle relaxer or topical lidocaine in addition to ibuprofen and Tylenol for unclear cause of chest pain.  Could be a pleurisy.  She declined and would like to go  home.  Discharged home with return precautions. Final Clinical Impression(s) / ED Diagnoses Final diagnoses:  Right-sided chest pain  Mediastinal lymphadenopathy  Rx / DC Orders ED Discharge Orders     None        Sherwood Gambler, MD 01/25/21 [phone]

## 2021-01-25 NOTE — ED Triage Notes (Signed)
Lower right anterior rib pain on-going x 1 week. Worse with inspiration, does not change with palpation. Radiates into back. Hr 117. Denies SOB, chest pain, hematuria. Works at [employer] job

## 2021-01-25 NOTE — ED Provider Notes (Signed)
[facility]    CSN: [id number] Arrival date & time: 01/25/21  0820      History   Chief Complaint Chief Complaint  Patient presents with   Pleurisy    HPI Paige Perry is a 45 y.o. female.   Patient presents with right-sided lower chest pain that has been present for approximately 1 week.  Patient reports that chest pain is exacerbated with deep breathing and is described as a sharp pain.  Pain is intermittent.  Denies any other symptoms including fever, shortness of breath, cough, nasal congestion, chest pain to mid or left side, urinary burning, urinary frequency, abnormal bowel movements.  Patient does have history of breast cancer, and patient states that it is "in remission".  Denies that she has ever had this pain before.    Past Medical History:  Diagnosis Date   Asthma    Cancer ([place])    breast   Family history of breast cancer    Family history of breast cancer    Family history of prostate cancer    Family history of throat cancer    Hypercholesteremia    PONV (postoperative nausea and vomiting)     Patient Active Problem List   Diagnosis Date Noted   Port-A-Cath in place 03/26/2019   Genetic testing 02/01/2019   Family history of prostate cancer    Family history of throat cancer    Family history of breast cancer    Malignant neoplasm of upper-outer quadrant of left breast in female, estrogen receptor positive ([place]) 01/18/2019   Other allergic rhinitis 12/03/2014    Past Surgical History:  Procedure Laterality Date   AXILLARY LYMPH NODE DISSECTION Left 07/11/2019   Procedure: LEFT AXILLARY LYMPH NODE DISSECTION;  Surgeon: Donnie Mesa, MD;  Location: [facility];  Service: General;  Laterality: Left;   BREAST LUMPECTOMY WITH RADIOACTIVE SEED AND AXILLARY LYMPH NODE DISSECTION Left 06/13/2019   Procedure: LEFT BREAST LUMPECTOMY WITH RADIOACTIVE SEED AND AXILLARY TARGETED LYMPH NODE DISSECTION;  Surgeon: Donnie Mesa, MD;   Location: [facility];  Service: General;  Laterality: Left;   FINGER SURGERY     PORTACATH PLACEMENT Right 02/05/2019   Procedure: INSERTION PORT-A-CATH WITH ULTRASOUND;  Surgeon: Donnie Mesa, MD;  Location: [facility];  Service: General;  Laterality: Right;   WOUND EXPLORATION Right 06/13/2019   Procedure: RIGHT CHEST WOUND EXPLORATION;  Surgeon: Donnie Mesa, MD;  Location: [facility];  Service: General;  Laterality: Right;    OB History   No obstetric history on file.      Home Medications    Prior to Admission medications   Medication Sig Start Date End Date Taking? Authorizing Provider  tamoxifen (NOLVADEX) 20 MG tablet TAKE 1 TABLET BY MOUTH EVERY DAY 10/08/20   Causey, Charlestine Massed, NP    Family History Family History  Problem Relation Age of Onset   Hypertension Mother    Heart failure Mother    Breast cancer Maternal Aunt        dx 21s   Breast cancer Paternal Aunt    Prostate cancer Father 21   Throat cancer Cousin     Social History Social History   Tobacco Use   Smoking status: Never   Smokeless tobacco: Never  Vaping Use   Vaping Use: Never used  Substance Use Topics   Alcohol use: Yes    Comment: occasionally   Drug use: No     Allergies  Latex, Other, and Adhesive [tape]   Review of Systems Review of Systems Per HPI  Physical Exam Triage Vital Signs ED Triage Vitals [01/25/21 0845]  Enc Vitals Group     BP 116/84     Pulse Rate (!) 110     Resp 16     Temp 98.8 F (37.1 C)     Temp Source Oral     SpO2 96 %     Weight      Height      Head Circumference      Peak Flow      Pain Score 0     Pain Loc      Pain Edu?      Excl. in [place]?    No data found.  Updated Vital Signs BP 116/84 (BP Location: Right Arm)   Pulse (!) 110   Temp 98.8 F (37.1 C) (Oral)   Resp 16   SpO2 96%   Visual Acuity Right Eye Distance:   Left Eye Distance:   Bilateral Distance:    Right Eye  Near:   Left Eye Near:    Bilateral Near:     Physical Exam Constitutional:      General: She is not in acute distress.    Appearance: Normal appearance. She is not toxic-appearing or diaphoretic.  HENT:     Head: Normocephalic and atraumatic.  Eyes:     Extraocular Movements: Extraocular movements intact.     Conjunctiva/sclera: Conjunctivae normal.     Pupils: Pupils are equal, round, and reactive to light.  Cardiovascular:     Rate and Rhythm: Regular rhythm. Tachycardia present.     Pulses: Normal pulses.     Heart sounds: Normal heart sounds.  Pulmonary:     Effort: Pulmonary effort is normal. No respiratory distress.     Breath sounds: Normal breath sounds. No stridor. No wheezing, rhonchi or rales.  Abdominal:     General: Abdomen is flat. Bowel sounds are normal. There is no distension.     Palpations: Abdomen is soft.     Tenderness: There is no abdominal tenderness.  Skin:    General: Skin is warm and dry.  Neurological:     General: No focal deficit present.     Mental Status: She is alert and oriented to person, place, and time. Mental status is at baseline.  Psychiatric:        Mood and Affect: Mood normal.        Behavior: Behavior normal.        Thought Content: Thought content normal.        Judgment: Judgment normal.     UC Treatments / Results  Labs (all labs ordered are listed, but only abnormal results are displayed) Labs Reviewed - No data to display  EKG   Radiology No results found.  Procedures Procedures (including critical care time)  Medications Ordered in UC Medications - No data to display  Initial Impression / Assessment and Plan / UC Course  I have reviewed the triage vital signs and the nursing notes.  Pertinent labs & imaging results that were available during my care of the patient were reviewed by me and considered in my medical decision making (see chart for details).     Differential diagnoses include pulmonary embolism  or pleurisy.  Patient does have elevated heart rate that is ranging from 110-115 in physical exam and triage.  It appears the patient needs further imaging rather than just an  x-ray to rule out other worrisome etiologies.  Unable to order outpatient CT in urgent care.  Advised patient that she will need to go to the hospital for further evaluation and management.  Patient was agreeable plan.  Vital signs stable at discharge.  Agree with patient self transport to the hospital. Final Clinical Impressions(s) / UC Diagnoses   Final diagnoses:  Right-sided chest pain     Discharge Instructions      Please go to the emergency department as soon as you leave urgent care for further evaluation and management.     ED Prescriptions   None    PDMP not reviewed this encounter.   Teodora Medici, [place] 01/25/21 [phone]

## 2021-01-25 NOTE — ED Notes (Signed)
Patient verbalizes understanding of discharge instructions. Opportunity for questioning and answers were provided. Patient discharged from ED.  °

## 2021-01-25 NOTE — ED Triage Notes (Addendum)
Pt sent from UC for CT due to pain in upper rt quad for a little over a week, Pt states it is pain only and radiates to back at times, Denies any other symptoms

## 2021-01-25 NOTE — Discharge Instructions (Signed)
If you develop recurrent, continued, or worsening chest pain, shortness of breath, fever, vomiting, abdominal or back pain, or any other new/concerning symptoms then return to the ER for evaluation.   Your CT scan showed abnormal lymph nodes in your mediastinum and hilar region.  There is also questionable lymph nodes on the left side of your chest near your breast and clavicle.  It is important to follow-up with your oncologist, Dr. Lindi Adie. Call today.

## 2021-01-26 NOTE — Assessment & Plan Note (Signed)
01/18/2019:Patient palpated a left breast lump for 2 weeks. Mammogram showed a 2.3cm left breast mass at the 12 o'clock position, with 1 enlarged left axillary lymph node measuring 3.5cm. 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 T2N1 stage IIa clinical stage  Treatment plan: 1. Neoadjuvant chemotherapy with TCH [facility] 6 cycles (maintenance anti-HER-2 therapy is not being given because the initial pathology was reread as HER-2 negative.  The final pathology on the lumpectomy was also HER-2 negative) ER 60% week, PR negative 2. 06/13/2019 left lumpectomy ([facility]): no residual carcinoma, 2/3 left axillary lymph nodes positive for carcinoma. 07/15/2019: ALN D: 3/3 lymph nodes positive.  No extranodal extension 3. Followed by adjuvant radiation therapy started 08/20/2019 No adverse effects from participation in the neuropathy clinical trial SWOG [id number] --------------------------------------------------------------------------------------------------------------------------------------- Treatment plan: Adjuvant antiestrogen therapy with tamoxifen 20 mg daily x10 years  CT Chest 01/25/21: No PE, Lymphadenopathy in mediastinum and hilar regions, Mass vs LN in axillary tain  Breast Cancer Surveillance: Mammograms 04/02/20: Benign Breast density Cat C  RTC in 1 year

## 2021-01-26 NOTE — Progress Notes (Signed)
Patient Care Team: [name], NP as PCP - General Mauro Kaufmann, RN as Oncology Nurse Navigator Rockwell Germany, RN as Oncology Nurse Navigator Donnie Mesa, MD as Consulting Physician (General Surgery) Nicholas Lose, MD as Consulting Physician (Hematology and Oncology) Kyung Rudd, MD as Consulting Physician (Radiation Oncology)  DIAGNOSIS:    ICD-10-CM   1. Malignant neoplasm of upper-outer quadrant of left breast in female, estrogen receptor positive ([place])  C50.412 US BREAST LTD UNI LEFT INC AXILLA   Z17.0 CT Chest W Contrast      SUMMARY OF ONCOLOGIC HISTORY: Oncology History  Malignant neoplasm of upper-outer quadrant of left breast in female, estrogen receptor positive ([place])  01/18/2019 Initial Diagnosis   Patient palpated a left breast lump for 2 weeks. Mammogram showed a 2.3cm left breast mass at the 12 o'clock position, with 1 enlarged left axillary lymph node measuring 3.5cm. 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   01/23/2019 Cancer Staging   Staging form: Breast, AJCC 8th Edition - Clinical stage from 01/23/2019: Stage IIA (cT2, cN1(f), cM0, G2, ER+, PR-, HER2+)   02/01/2019 Genetic Testing   Negative genetic testing. No pathogenic variants identified on the [facility] Common Hereditary Cancers Panel. The Common Hereditary Cancers Panel offered by [facility] includes sequencing and/or deletion duplication testing of the following 48 genes: APC, ATM, AXIN2, BARD1, BMPR1A, BRCA1, BRCA2, BRIP1, CDH1, CDKN2A (p14ARF), CDKN2A (p16INK4a), CKD4, CHEK2, CTNNA1, DICER1, EPCAM (Deletion/duplication testing only), GREM1 (promoter region deletion/duplication testing only), KIT, MEN1, MLH1, MSH2, MSH3, MSH6, MUTYH, NBN, NF1, NHTL1, PALB2, PDGFRA, PMS2, POLD1, POLE, PTEN, RAD50, RAD51C, RAD51D, RNF43, SDHB, SDHC, SDHD, SMAD4, SMARCA4. STK11, TP53, TSC1, TSC2, and VHL.  The following genes were evaluated for sequence changes only: SDHA and HOXB13  c.251G>A variant only. The report date is 02/01/2019.    02/13/2019 - 05/28/2019 Chemotherapy   dexamethasone (DECADRON) 4 MG tablet, 4 mg (100 % of original dose 4 mg), Oral, Daily, 1 of 1 cycle, Start date: 01/23/2019, End date: 05/28/2019. Dose modification: 4 mg (original dose 4 mg, Cycle 0)  palonosetron (ALOXI) injection 0.25 mg, 0.25 mg, Intravenous,  Once, 6 of 6 cycles. Administration: 0.25 mg (02/13/2019), 0.25 mg (03/04/2019), 0.25 mg (05/07/2019), 0.25 mg (05/28/2019), 0.25 mg (03/26/2019), 0.25 mg (04/18/2019)  pegfilgrastim-jmdb (FULPHILA) injection 6 mg, 6 mg, Subcutaneous,  Once, 6 of 6 cycles. Administration: 6 mg (02/15/2019), 6 mg (03/06/2019), 6 mg (05/09/2019), 6 mg (05/30/2019), 6 mg (03/28/2019), 6 mg (04/20/2019)  CARBOplatin (PARAPLATIN) 700 mg in sodium chloride 0.9 % 250 mL chemo infusion, 700 mg (100 % of original dose 700 mg), Intravenous,  Once, 6 of 6 cycles. Dose modification: 700 mg (original dose 700 mg, Cycle 1). Administration: 700 mg (02/13/2019), 700 mg (03/04/2019), 700 mg (05/07/2019), 700 mg (05/28/2019), 700 mg (03/26/2019), 700 mg (04/18/2019)  DOCEtaxel (TAXOTERE) 140 mg in sodium chloride 0.9 % 250 mL chemo infusion, 75 mg/m2 = 140 mg, Intravenous,  Once, 6 of 6 cycles. Dose modification: 65 mg/m2 (original dose 75 mg/m2, Cycle 5, Reason: Dose not tolerated). Administration: 140 mg (02/13/2019), 140 mg (03/04/2019), 120 mg (05/07/2019), 120 mg (05/28/2019), 140 mg (03/26/2019), 140 mg (04/18/2019)  pertuzumab (PERJETA) 420 mg in sodium chloride 0.9 % 250 mL chemo infusion, 420 mg (100 % of original dose 420 mg), Intravenous, Once, 3 of 3 cycles. Dose modification: 420 mg (original dose 420 mg, Cycle 1, Reason: Provider Judgment). Administration: 420 mg (02/13/2019), 420 mg (03/04/2019), 420 mg (03/26/2019)  fosaprepitant (EMEND) 150 mg  dexamethasone (DECADRON) 12 mg in sodium chloride 0.9 % 145 mL IVPB, , Intravenous,  Once, 6 of 6 cycles. Administration:  (02/13/2019),   (03/04/2019),  (05/07/2019),  (05/28/2019),  (03/26/2019),  (04/18/2019)  trastuzumab-dkst (OGIVRI) 651 mg in sodium chloride 0.9 % 250 mL chemo infusion, 8 mg/kg = 651 mg, Intravenous,  Once, 6 of 6 cycles. Administration: 651 mg (02/13/2019), 483 mg (03/04/2019), 483 mg (05/07/2019), 483 mg (05/28/2019), 483 mg (03/26/2019), 483 mg (04/18/2019).   06/13/2019 Surgery   Left lumpectomy ([facility]) [phone]): no residual carcinoma, 2/3 left axillary lymph nodes positive for carcinoma. Lymphovascular space invasion was present.   06/20/2019 Cancer Staging   Staging form: Breast, AJCC 8th Edition - Pathologic stage from 06/20/2019: No Stage Recommended (ypT0, pN1, cM0, ER+, PR-, HER2-)   07/11/2019 Surgery   Axillary lymph node dissection ([facility]) ([id number]): 3/3 lymph nodes positive for cancer   08/19/2019 - 10/07/2019 Radiation Therapy   The patient initially received a dose of 50.4 Gy in 28 fractions to the breast and SCLV region using a 4-field approach. This was delivered using a 3-D conformal technique. The patient then received a boost to the seroma. This delivered an additional 10 Gy in 5 fractions using a 3-field photon boost technique. The total dose was 60.4 Gy.   09/2019 - 09/2029 Anti-estrogen oral therapy   Tamoxifen     CHIEF COMPLIANT: Follow-up of left breast cancer on tamoxifen   INTERVAL HISTORY: Paige Perry is a 45 y.o. with above-mentioned history of left breast cancer treated with neoadjuvant chemotherapy, left lumpectomy, radiation, and is currently on antiestrogen therapy with tamoxifen. She presents today for follow-up.  She went to the emergency room complaining of right upper quadrant abdominal pain which came on with deep inspiration.  She does not remember doing anything to bring it on.  In the emergency room she had a CT of her chest angiogram which did not show any showed mediastinal and hilar lymphadenopathy as well as a left axillary thickening.  She was asked to follow-up  with [place] to discuss treatment options and work-up for this.  ALLERGIES:  is allergic to latex, other, and adhesive [tape].  MEDICATIONS:  Current Outpatient Medications  Medication Sig Dispense Refill   tamoxifen (NOLVADEX) 20 MG tablet TAKE 1 TABLET BY MOUTH EVERY DAY 90 tablet 3   No current facility-administered medications for this visit.    PHYSICAL EXAMINATION: ECOG PERFORMANCE STATUS: 1 - Symptomatic but completely ambulatory  Vitals:   01/27/21 1031  BP: 109/75  Pulse: (!) 116  Resp: 18  Temp: 97.9 F (36.6 C)  SpO2: 100%   Filed Weights   01/27/21 1031  Weight: 175 lb 11.2 oz (79.7 kg)     BREAST: Palpable thickening in the left axilla related to surgical scars (exam performed in the presence of a chaperone)  LABORATORY DATA:  I have reviewed the data as listed CMP Latest Ref Rng & Units 01/25/2021 05/28/2019 05/07/2019  Glucose 70 - 99 mg/dL 96 107(H) 138(H)  BUN 6 - 20 mg/dL _0 Creatinine 0.44 - 1.00 mg/dL 0.90 0.81 0.81  Sodium 135 - 145 mmol/L 137 142 139  Potassium 3.5 - 5.1 mmol/L 3.5 3.5 3.9  Chloride 98 - 111 mmol/L 100 108 108  CO2 22 - 32 mmol/L 27 25 21(L)  Calcium 8.9 - 10.3 mg/dL 9.8 9.1 9.2  Total Protein 6.5 - 8.1 g/dL 9.0(H) 6.7 7.6  Total Bilirubin 0.3 - 1.2 mg/dL 0.6 0.4 0.3  Alkaline Phos  38 - 126 U/L 81 86 92  AST 15 - 41 U/L _0 ALT 0 - 44 U/L 30 34 22    Lab Results  Component Value Date   WBC 6.0 01/25/2021   HGB 13.0 01/25/2021   HCT 39.6 01/25/2021   MCV 89.6 01/25/2021   PLT 311 01/25/2021   NEUTROABS 4.9 05/28/2019    ASSESSMENT & PLAN:  Malignant neoplasm of upper-outer quadrant of left breast in female, estrogen receptor positive ([place]) 01/18/2019: Patient palpated a left breast lump for 2 weeks. Mammogram showed a 2.3cm left breast mass at the 12 o'clock position, with 1 enlarged left axillary lymph node measuring 3.5cm. 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  ER positive, PR  negative, HER-2 positive (initial IHC 3+) accidentally FISH was done which was negative. T2N1 stage IIa clinical stage   Treatment plan: 1. Neoadjuvant chemotherapy with TCH Perjeta 6 cycles (maintenance anti-HER-2 therapy is not being given because the initial pathology was reread as HER-2 negative.  The final pathology on the lumpectomy was also HER-2 negative) ER 60% week, PR negative 2. 06/13/2019 left lumpectomy ([facility]): no residual carcinoma, 2/3 left axillary lymph nodes positive for carcinoma. 07/15/2019: ALN D: 3/3 lymph nodes positive.  No extranodal extension 3. Followed by adjuvant radiation therapy started 08/20/2019 No adverse effects from participation in the neuropathy clinical trial SWOG [id number] --------------------------------------------------------------------------------------------------------------------------------------- Treatment plan: Adjuvant antiestrogen therapy with tamoxifen 20 mg daily x10 years   CT Chest 01/25/21: No PE, Lymphadenopathy in mediastinum and hilar regions, Mass vs LN in axillary tain  Breast Cancer Surveillance: Mammograms 04/02/20: Benign Breast density Cat C Recommendation: Left axillary ultrasound for further evaluation of the finding on the CT CT chest in 2 months for assessment of the lymphadenopathy.  If these lymph nodes are getting worse then we will obtain a biopsy or a PET CT scan.  RTC after the CT scans in 2 months.    Orders Placed This Encounter  Procedures   US BREAST LTD UNI LEFT INC AXILLA    Standing Status:   Future    Standing Expiration Date:   01/27/2022    Order Specific Question:   Reason for Exam (SYMPTOM  OR DIAGNOSIS REQUIRED)    Answer:   Left axillary mass seen on CT scan    Order Specific Question:   Preferred imaging location?    Answer:   [facility]    Order Specific Question:   Release to patient    Answer:   Immediate   CT Chest W Contrast    Standing Status:   Future    Standing Expiration Date:    01/27/2022    Order Specific Question:   If indicated for the ordered procedure, I authorize the administration of contrast media per Radiology protocol    Answer:   Yes    Order Specific Question:   Is patient pregnant?    Answer:   No    Order Specific Question:   Preferred imaging location?    Answer:   [facility]    Order Specific Question:   Release to patient    Answer:   Immediate   The patient has a good understanding of the overall plan. she agrees with it. she will call with any problems that may develop before the next visit here.  Total time spent: 30 mins including face to face time and time spent for planning, charting and coordination of care  Shanicka Oldenkamp  Loyal Gambler, MD, MPH 01/27/2021  I, Thana Ates, am acting as scribe for Dr. Nicholas Lose.  I have reviewed the above documentation for accuracy and completeness, and I agree with the above.

## 2021-01-27 ENCOUNTER — Other Ambulatory Visit: Payer: Self-pay

## 2021-01-27 ENCOUNTER — Inpatient Hospital Stay: Payer: BC Managed Care – PPO | Attending: Hematology and Oncology | Admitting: Hematology and Oncology

## 2021-01-27 DIAGNOSIS — Z923 Personal history of irradiation: Secondary | ICD-10-CM | POA: Diagnosis not present

## 2021-01-27 DIAGNOSIS — Z9221 Personal history of antineoplastic chemotherapy: Secondary | ICD-10-CM | POA: Diagnosis not present

## 2021-01-27 DIAGNOSIS — Z79810 Long term (current) use of selective estrogen receptor modulators (SERMs): Secondary | ICD-10-CM | POA: Insufficient documentation

## 2021-01-27 DIAGNOSIS — C50412 Malignant neoplasm of upper-outer quadrant of left female breast: Secondary | ICD-10-CM | POA: Diagnosis not present

## 2021-01-27 DIAGNOSIS — Z170 Estrogen receptor positive status [ER+]: Secondary | ICD-10-CM | POA: Insufficient documentation

## 2021-02-03 ENCOUNTER — Other Ambulatory Visit: Payer: Self-pay | Admitting: Hematology and Oncology

## 2021-02-03 DIAGNOSIS — C50412 Malignant neoplasm of upper-outer quadrant of left female breast: Secondary | ICD-10-CM

## 2021-02-03 DIAGNOSIS — Z170 Estrogen receptor positive status [ER+]: Secondary | ICD-10-CM

## 2021-03-22 ENCOUNTER — Encounter (HOSPITAL_BASED_OUTPATIENT_CLINIC_OR_DEPARTMENT_OTHER): Payer: Self-pay | Admitting: *Deleted

## 2021-03-22 ENCOUNTER — Other Ambulatory Visit: Payer: Self-pay

## 2021-03-22 ENCOUNTER — Emergency Department (HOSPITAL_BASED_OUTPATIENT_CLINIC_OR_DEPARTMENT_OTHER)
Admission: EM | Admit: 2021-03-22 | Discharge: 2021-03-22 | Disposition: A | Discharge status: Home / Self Care | Payer: BC Managed Care – PPO | Attending: Emergency Medicine | Admitting: Emergency Medicine

## 2021-03-22 ENCOUNTER — Emergency Department (HOSPITAL_BASED_OUTPATIENT_CLINIC_OR_DEPARTMENT_OTHER): Payer: BC Managed Care – PPO

## 2021-03-22 DIAGNOSIS — J45909 Unspecified asthma, uncomplicated: Secondary | ICD-10-CM | POA: Diagnosis not present

## 2021-03-22 DIAGNOSIS — Z853 Personal history of malignant neoplasm of breast: Secondary | ICD-10-CM | POA: Diagnosis not present

## 2021-03-22 DIAGNOSIS — R000 Tachycardia, unspecified: Secondary | ICD-10-CM | POA: Diagnosis not present

## 2021-03-22 DIAGNOSIS — F1090 Alcohol use, unspecified, uncomplicated: Secondary | ICD-10-CM | POA: Diagnosis not present

## 2021-03-22 DIAGNOSIS — J90 Pleural effusion, not elsewhere classified: Secondary | ICD-10-CM | POA: Insufficient documentation

## 2021-03-22 DIAGNOSIS — C787 Secondary malignant neoplasm of liver and intrahepatic bile duct: Secondary | ICD-10-CM | POA: Diagnosis not present

## 2021-03-22 DIAGNOSIS — M545 Low back pain, unspecified: Secondary | ICD-10-CM | POA: Diagnosis present

## 2021-03-22 DIAGNOSIS — Z91040 Latex allergy status: Secondary | ICD-10-CM | POA: Insufficient documentation

## 2021-03-22 DIAGNOSIS — C801 Malignant (primary) neoplasm, unspecified: Secondary | ICD-10-CM | POA: Diagnosis not present

## 2021-03-22 LAB — URINALYSIS, ROUTINE W REFLEX MICROSCOPIC
Bilirubin Urine: NEGATIVE
Glucose, UA: NEGATIVE mg/dL
Hgb urine dipstick: NEGATIVE
Ketones, ur: >80 mg/dL — AB
Leukocytes,Ua: NEGATIVE
Nitrite: NEGATIVE
Specific Gravity, Urine: 1.019 (ref 1.005–1.030)
pH: 6 (ref 5.0–8.0)

## 2021-03-22 LAB — CBC WITH DIFFERENTIAL/PLATELET
Abs Immature Granulocytes: 0.02 10*3/uL (ref 0.00–0.07)
Basophils Absolute: 0 10*3/uL (ref 0.0–0.1)
Basophils Relative: 0 %
Eosinophils Absolute: 0 10*3/uL (ref 0.0–0.5)
Eosinophils Relative: 0 %
HCT: 39.4 % (ref 36.0–46.0)
Hemoglobin: 13 g/dL (ref 12.0–15.0)
Immature Granulocytes: 0 %
Lymphocytes Relative: 28 %
Lymphs Abs: 1.6 10*3/uL (ref 0.7–4.0)
MCH: 29.3 pg (ref 26.0–34.0)
MCHC: 33 g/dL (ref 30.0–36.0)
MCV: 88.9 fL (ref 80.0–100.0)
Monocytes Absolute: 0.5 10*3/uL (ref 0.1–1.0)
Monocytes Relative: 9 %
Neutro Abs: 3.6 10*3/uL (ref 1.7–7.7)
Neutrophils Relative %: 63 %
Platelets: 286 10*3/uL (ref 150–400)
RBC: 4.43 MIL/uL (ref 3.87–5.11)
RDW: 12.3 % (ref 11.5–15.5)
WBC: 5.8 10*3/uL (ref 4.0–10.5)
nRBC: 0 % (ref 0.0–0.2)

## 2021-03-22 LAB — COMPREHENSIVE METABOLIC PANEL
ALT: 59 U/L — ABNORMAL HIGH (ref 0–44)
AST: 78 U/L — ABNORMAL HIGH (ref 15–41)
Albumin: 3.5 g/dL (ref 3.5–5.0)
Alkaline Phosphatase: 97 U/L (ref 38–126)
Anion gap: 11 (ref 5–15)
BUN: 9 mg/dL (ref 6–20)
CO2: 27 mmol/L (ref 22–32)
Calcium: 9.3 mg/dL (ref 8.9–10.3)
Chloride: 92 mmol/L — ABNORMAL LOW (ref 98–111)
Creatinine, Ser: 0.68 mg/dL (ref 0.44–1.00)
GFR, Estimated: >60 mL/min (ref >60)
Glucose, Bld: 142 mg/dL — ABNORMAL HIGH (ref 70–99)
Potassium: 3.3 mmol/L — ABNORMAL LOW (ref 3.5–5.1)
Sodium: 130 mmol/L — ABNORMAL LOW (ref 135–145)
Total Bilirubin: 0.6 mg/dL (ref 0.3–1.2)
Total Protein: 8 g/dL (ref 6.5–8.1)

## 2021-03-22 LAB — LIPASE, BLOOD: Lipase: 10 U/L — ABNORMAL LOW (ref 11–51)

## 2021-03-22 LAB — TROPONIN I (HIGH SENSITIVITY): Troponin I (High Sensitivity): <2 ng/L (ref <18)

## 2021-03-22 IMAGING — CT CT ABD-PELV W/ CM
2 of 5 series · 15 of 46 positions shown, 17 images · IV contrast (APPLIED)
Comparison: Chest CT of [DATE].  No prior abdominopelvic CTs.

CLINICAL DATA: Abdominal pain. Flank pain. Decreased appetite.
History of breast cancer.

EXAM:
CT ABDOMEN AND PELVIS WITH CONTRAST
TECHNIQUE: Multidetector CT imaging of the abdomen and pelvis was performed
using the standard protocol following bolus administration of
intravenous contrast.
CONTRAST:  100mL OMNIPAQUE IOHEXOL 300 MG/ML  SOLN

[Series 2: abd pel w · axial · 0.79mm/px · z∈[+788,+1198]mm · 12 of 94 slices shown, 14 images]
[im 6/94  soft-tissue]
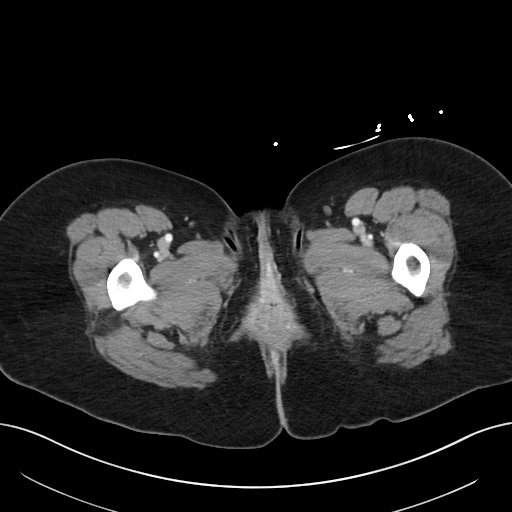
[im 6/94  bone]
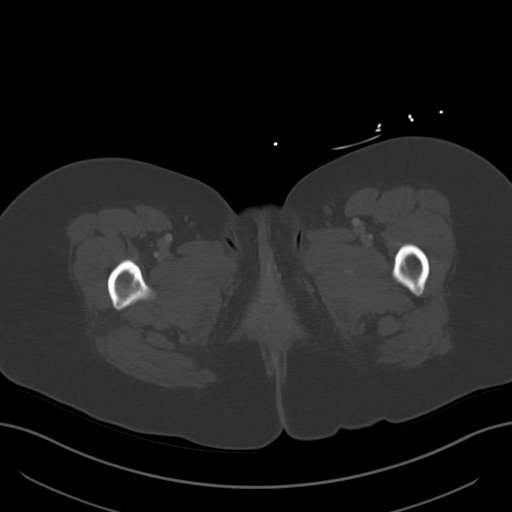
[im 16/94  soft-tissue]
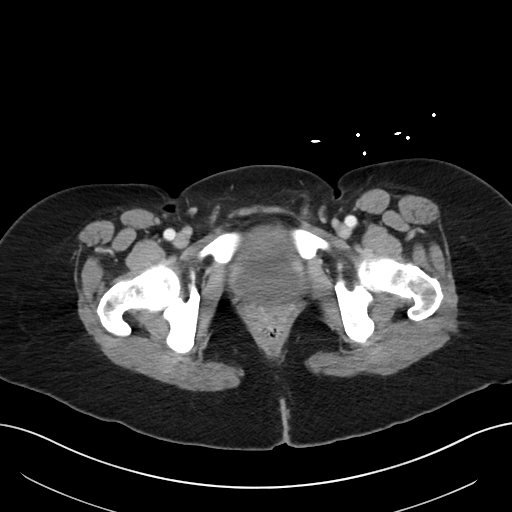
[im 21/94  soft-tissue]
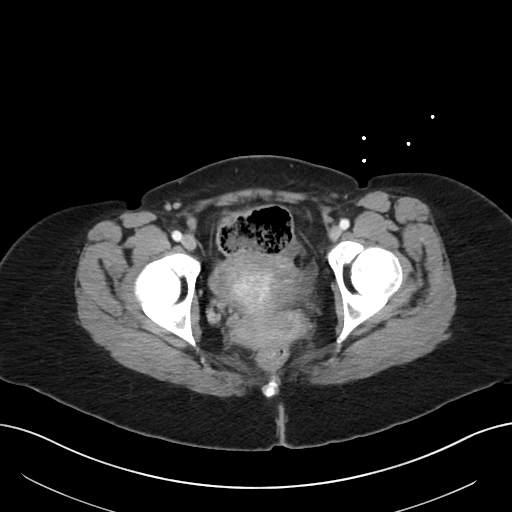
[im 26/94  soft-tissue]
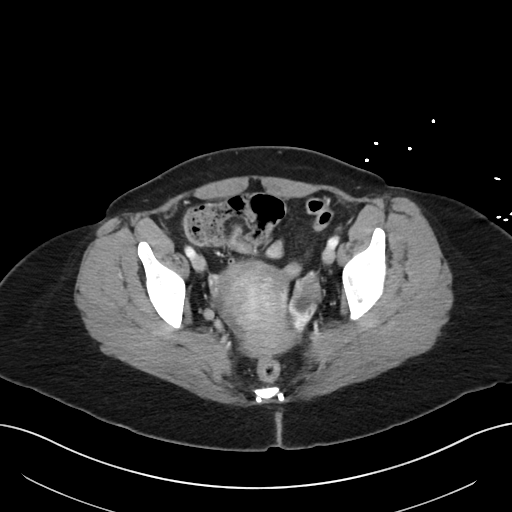
[im 37/94  soft-tissue]
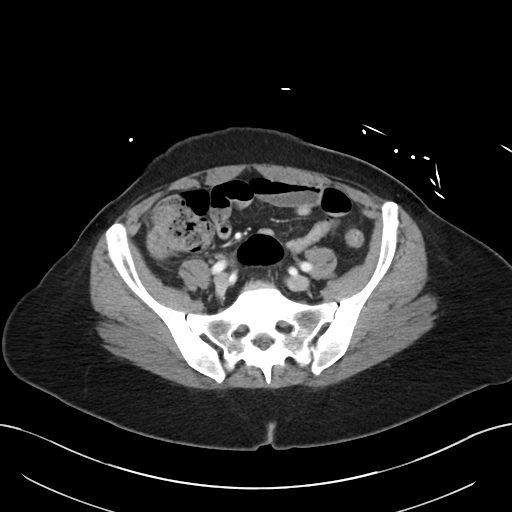
[im 42/94  soft-tissue]
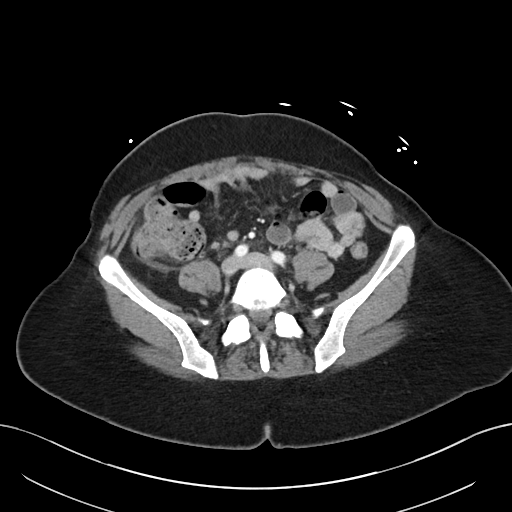
[im 52/94  soft-tissue]
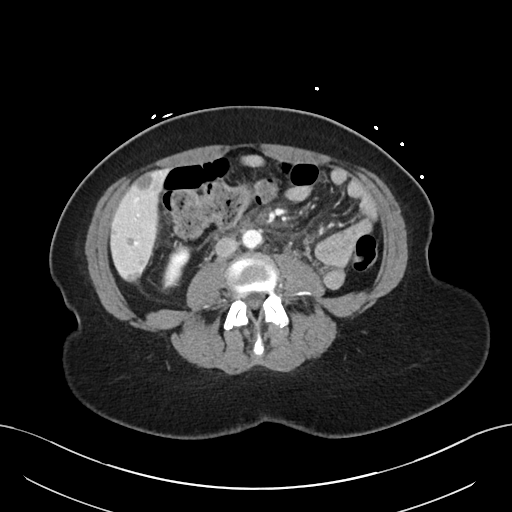
[im 57/94  soft-tissue]
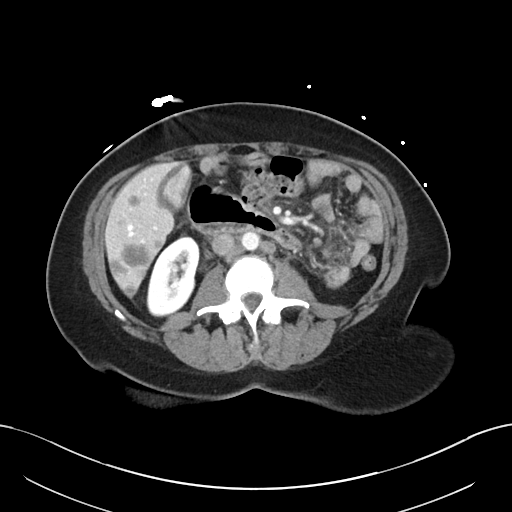
[im 68/94  soft-tissue]
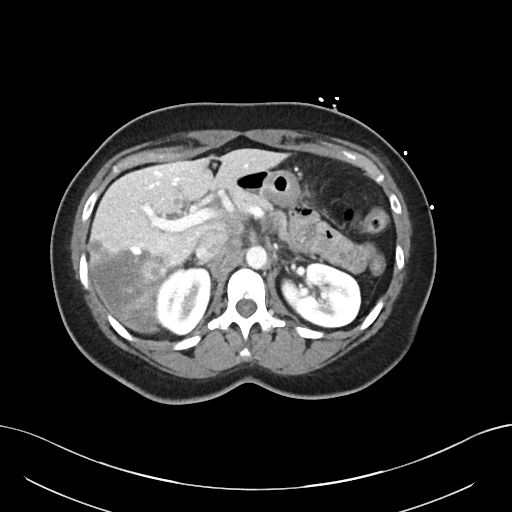
[im 68/94  bone]
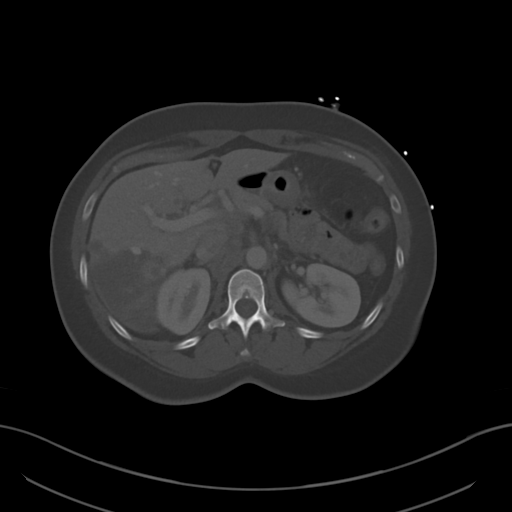
[im 73/94  soft-tissue]
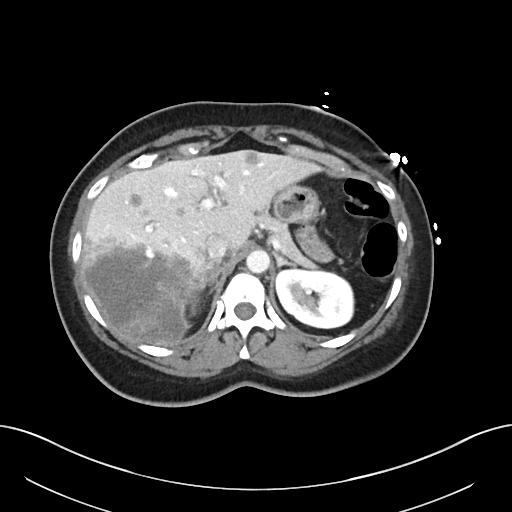
[im 78/94  soft-tissue]
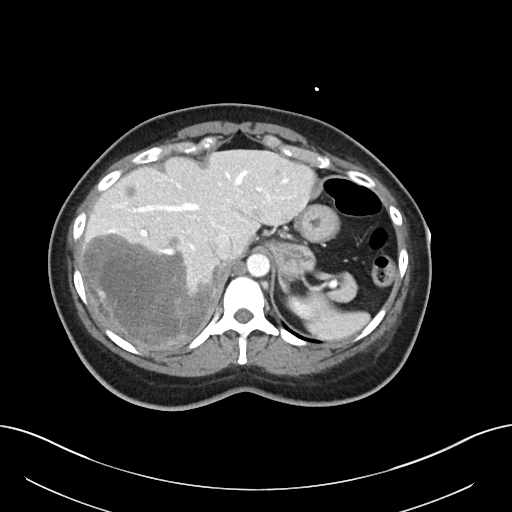
[im 88/94  soft-tissue]
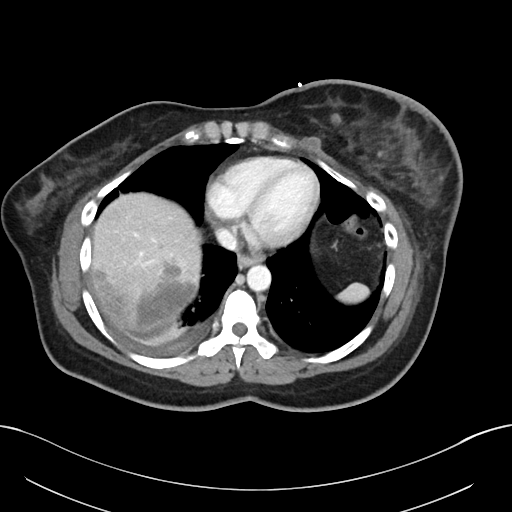

[Series 5: coronal · coronal · 0.79mm/px · 3 of 97 slices shown]
[im 33/97  soft-tissue]
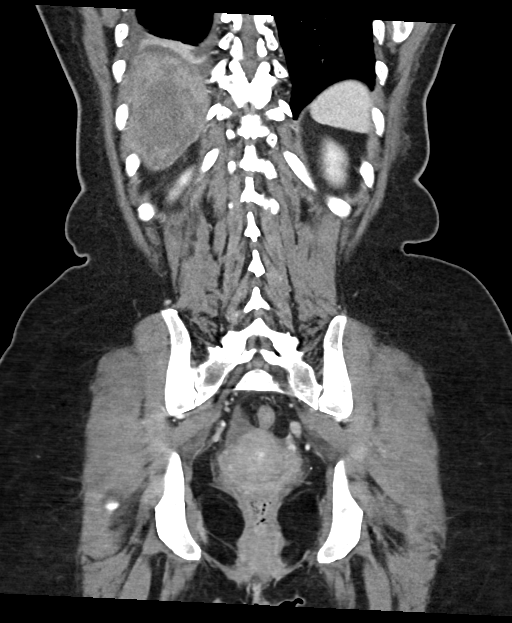
[im 43/97  soft-tissue]
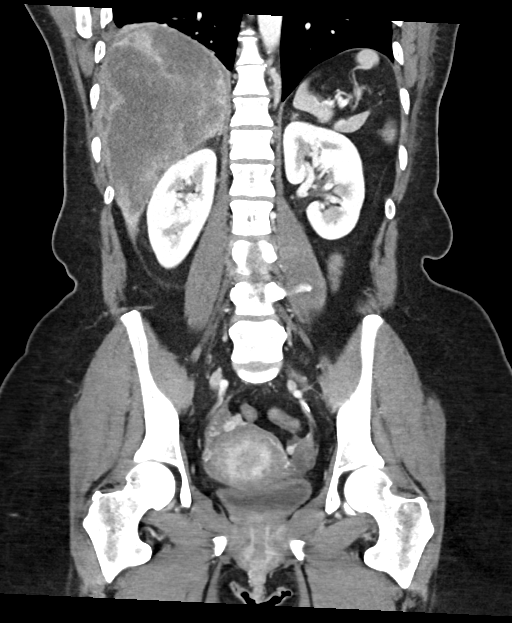
[im 54/97  soft-tissue]
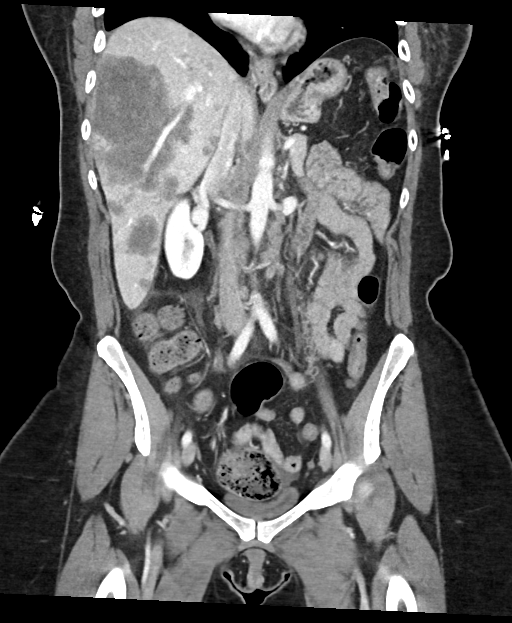

[15 of 46 positions shown; findings below may reference images not displayed]

FINDINGS: Lower chest: Clear lung bases. Small right pleural effusion is new
since the prior chest CT.

Low left mediastinal necrotic adenopathy, including at 8 mm on
[DATE].

Central left breast mass of 2.6 x 2.8 cm on [DATE] is new or more
well-defined than on the prior. Necrotic nodes or nodules within the
left breast and along the left pectoralis musculature including at
1.3 cm on [DATE].

Hepatobiliary: Hepatomegaly at 23 cm craniocaudal. Bilateral hepatic
metastasis. A dominant right hepatic lobe mass measures on the order
of 10.7 x 6.9 cm on [DATE]. Index segment 3 lesion measures 1.1 cm on
[DATE].

Probable vicarious excretion of contrast by the gallbladder.
Gallbladder wall thickening at 5 mm without specific evidence of
acute cholecystitis. No biliary duct dilatation.

Pancreas: Normal, without mass or ductal dilatation.

Spleen: Normal in size, without focal abnormality.

Adrenals/Urinary Tract: Mild right adrenal nodularity. Normal left
adrenal gland. Normal kidneys, without hydronephrosis. Normal
urinary bladder.

Stomach/Bowel: Gastric antral underdistention. Colonic stool burden
suggests constipation. Cecum extends into the upper central pelvis.
Normal terminal ileum and appendix. Normal small bowel.

Vascular/Lymphatic: Aortic atherosclerosis. Left periaortic 11 mm
abdominal retroperitoneal node on 36/2.

Porta hepatis adenopathy is mild at 1.3 cm on [DATE]. There is also
enlarged retrocaval node at 1.2 cm on [DATE].

9 mm retrocrural node is newly enlarged since the prior chest CT.

No pelvic sidewall adenopathy.

Reproductive: Normal uterus and adnexa. Prominent gonadal veins, as
can be seen with pelvic congestion syndrome.

Other: Trace free pelvic fluid is likely physiologic. Nonspecific
mild edema and minimal fluid within the anterior pararenal space of
the right hemiabdomen on 48/2.

Musculoskeletal: Degenerative disc disease at the lumbosacral
junction.
IMPRESSION: 1. Widespread metastatic disease, including to the liver,
thoracoabdominal nodes, left breast/chest wall.
2. No bowel obstruction or other acute complication.
3. Trace edema and fluid within the right abdominal anterior
perirenal space. No source identified. This could be correlated with
pancreatic enzyme levels, to exclude unlikely pancreatic source.
4. Gallbladder wall thickening, without specific evidence of acute
cholecystitis. Correlate with right upper quadrant symptoms and
possibly ultrasound.
5. Small right pleural effusion.
6. Left breast mass is more well-defined today and may represent
recurrent or residual primary, given clinical history.

## 2021-03-22 MED ORDER — HYDROCODONE-ACETAMINOPHEN 5-325 MG PO TABS: 1.0000 | ORAL_TABLET | Freq: Four times a day (QID) | ORAL | 0 refills | Status: DC | PRN

## 2021-03-22 MED ORDER — IOHEXOL 300 MG/ML  SOLN
100.0000 mL | Freq: Once | INTRAMUSCULAR | Status: AC | PRN
  Administered 2021-03-22: 12:00:00 100 mL via INTRAVENOUS

## 2021-03-22 MED ORDER — LACTATED RINGERS IV BOLUS
1000.0000 mL | Freq: Once | INTRAVENOUS | Status: AC
  Administered 2021-03-22: 10:00:00 1000 mL via INTRAVENOUS

## 2021-03-22 MED ORDER — ONDANSETRON HCL 4 MG/2ML IJ SOLN
4.0000 mg | Freq: Once | INTRAMUSCULAR | Status: AC
  Administered 2021-03-22: 11:00:00 4 mg via INTRAVENOUS
  Filled 2021-03-22: qty 2

## 2021-03-22 MED ORDER — MORPHINE SULFATE (PF) 4 MG/ML IV SOLN
4.0000 mg | Freq: Once | INTRAVENOUS | Status: AC
  Administered 2021-03-22: 11:00:00 4 mg via INTRAVENOUS
  Filled 2021-03-22: qty 1

## 2021-03-22 NOTE — ED Triage Notes (Signed)
Pt states she continues to have back pain into her rt knee and shoulder, feels gases and has loss of appetite.

## 2021-03-22 NOTE — Discharge Instructions (Signed)
Unfortunately it appears that the cancer has spread in your liver and lymph nodes.  This is most likely what is causing the pain in your side in your back.  Use the pain medication as needed but if you are using it regularly make sure you are using a scoop of MiraLAX or a stool softener to keep your bowel movements regular.

## 2021-03-22 NOTE — ED Provider Notes (Signed)
[facility]PT Provider Note   CSN: [id number] Arrival date & time: 03/22/21  0940     History Chief Complaint  Patient presents with   Back Pain    Paige Perry is a 45 y.o. female.  Patient is a 45 year old female with a history of asthma, high cholesterol and prior breast cancer with completed treatment who is returning to the emergency room today due to persistent back pain, feeling very gassy and change in bowel movements.  She reports that approximately 1 month ago she was here and at that time she was having more right rib pain and right abdominal pain.  She reports that at that time it was painful to take a deep breath and that pain has subsided but she has continued to have pain in the right side of her back.  It does seem worse with eating and last week she was having some nausea with it but now denies any nausea.  She has had no vomiting.  Her last bowel movement was a week ago and she reports she has a lot of gas but has not been passing any stool.  She is also not been eating because she has not had an appetite and feel that the eating makes the pain worse.  She has not noticed any change in her urinary habits.  She has not had a fever.  The pain is in her right lower back and does intermittently radiate down her leg.  She reports the pain does move and is episodic but she does not seem to be having any at this moment.  She also has noticed in the last month that she becomes short of breath with activity and does have to sit down and rest.  She followed up with her doctor after her last emergency room visit due to the concern for possible increased lymph nodes but her doctor told her that he wanted to do some further breast imaging and did not feel that there was adequate information based on the scan.  She denies any prior abdominal surgeries and denies any recent medication changes.  The history is provided by the patient.  Back Pain Location:  Lumbar  spine Quality:  Cramping, aching and shooting Radiates to:  R posterior upper leg Pain severity:  Moderate Pain is:  Unable to specify Onset quality:  Gradual Duration:  1 month Timing:  Intermittent Progression:  Waxing and waning     Past Medical History:  Diagnosis Date   Asthma    Cancer ([place])    breast   Family history of breast cancer    Family history of breast cancer    Family history of prostate cancer    Family history of throat cancer    Hypercholesteremia    PONV (postoperative nausea and vomiting)     Patient Active Problem List   Diagnosis Date Noted   Port-A-Cath in place 03/26/2019   Genetic testing 02/01/2019   Family history of prostate cancer    Family history of throat cancer    Family history of breast cancer    Malignant neoplasm of upper-outer quadrant of left breast in female, estrogen receptor positive ([place]) 01/18/2019   Other allergic rhinitis 12/03/2014    Past Surgical History:  Procedure Laterality Date   AXILLARY LYMPH NODE DISSECTION Left 07/11/2019   Procedure: LEFT AXILLARY LYMPH NODE DISSECTION;  Surgeon: Donnie Mesa, MD;  Location: [facility];  Service: General;  Laterality: Left;   BREAST  LUMPECTOMY WITH RADIOACTIVE SEED AND AXILLARY LYMPH NODE DISSECTION Left 06/13/2019   Procedure: LEFT BREAST LUMPECTOMY WITH RADIOACTIVE SEED AND AXILLARY TARGETED LYMPH NODE DISSECTION;  Surgeon: Donnie Mesa, MD;  Location: [facility];  Service: General;  Laterality: Left;   FINGER SURGERY     PORTACATH PLACEMENT Right 02/05/2019   Procedure: INSERTION PORT-A-CATH WITH ULTRASOUND;  Surgeon: Donnie Mesa, MD;  Location: [facility];  Service: General;  Laterality: Right;   WISDOM TOOTH EXTRACTION     WOUND EXPLORATION Right 06/13/2019   Procedure: RIGHT CHEST WOUND EXPLORATION;  Surgeon: Donnie Mesa, MD;  Location: [facility];  Service: General;  Laterality: Right;     OB  History   No obstetric history on file.     Family History  Problem Relation Age of Onset   Hypertension Mother    Heart failure Mother    Breast cancer Maternal Aunt        dx 12s   Breast cancer Paternal Aunt    Prostate cancer Father 15   Throat cancer Cousin     Social History   Tobacco Use   Smoking status: Never   Smokeless tobacco: Never  Vaping Use   Vaping Use: Never used  Substance Use Topics   Alcohol use: Yes    Comment: occasionally   Drug use: No    Home Medications Prior to Admission medications   Medication Sig Start Date End Date Taking? Authorizing Provider  tamoxifen (NOLVADEX) 20 MG tablet TAKE 1 TABLET BY MOUTH EVERY DAY 10/08/20   Causey, Charlestine Massed, NP    Allergies    Latex, Other, and Adhesive [tape]  Review of Systems   Review of Systems  Musculoskeletal:  Positive for back pain.  All other systems reviewed and are negative.  Physical Exam Updated Vital Signs BP 123/90 (BP Location: Right Arm)    Pulse (!) 135    Temp 97.6 F (36.4 C) (Oral)    Resp 18    Ht 5\' 3"  (1.6 m)    Wt 75.8 kg    SpO2 100%    BMI 29.58 kg/m   Physical Exam Vitals and nursing note reviewed.  Constitutional:      General: She is not in acute distress.    Appearance: Normal appearance. She is well-developed.  HENT:     Head: Normocephalic and atraumatic.     Mouth/Throat:     Mouth: Mucous membranes are moist.  Eyes:     Pupils: Pupils are equal, round, and reactive to light.  Cardiovascular:     Rate and Rhythm: Regular rhythm. Tachycardia present.     Pulses: Normal pulses.     Heart sounds: Normal heart sounds. No murmur heard.   No friction rub.  Pulmonary:     Effort: Pulmonary effort is normal.     Breath sounds: Normal breath sounds. No wheezing or rales.  Abdominal:     General: Bowel sounds are normal. There is no distension.     Palpations: Abdomen is soft.     Tenderness: There is abdominal tenderness. There is no right CVA  tenderness, left CVA tenderness, guarding or rebound.     Comments: Minimal right upper quadrant tenderness.  When palpating the left upper quadrant she reports some mild pain in her right back  Musculoskeletal:        General: No tenderness. Normal range of motion.     Cervical back: Normal range of motion and neck supple.  Right lower leg: No edema.     Left lower leg: No edema.     Comments: No edema  Skin:    General: Skin is warm and dry.     Findings: No rash.  Neurological:     Mental Status: She is alert and oriented to person, place, and time. Mental status is at baseline.     Cranial Nerves: No cranial nerve deficit.  Psychiatric:        Mood and Affect: Mood normal.        Behavior: Behavior normal.    ED Results / Procedures / Treatments   Labs (all labs ordered are listed, but only abnormal results are displayed) Labs Reviewed  COMPREHENSIVE METABOLIC PANEL - Abnormal; Notable for the following components:      Result Value   Sodium 130 (*)    Potassium 3.3 (*)    Chloride 92 (*)    Glucose, Bld 142 (*)    AST 78 (*)    ALT 59 (*)    All other components within normal limits  LIPASE, BLOOD - Abnormal; Notable for the following components:   Lipase 10 (*)    All other components within normal limits  URINALYSIS, ROUTINE W REFLEX MICROSCOPIC - Abnormal; Notable for the following components:   APPearance HAZY (*)    Ketones, ur >80 (*)    Protein, ur TRACE (*)    Bacteria, UA FEW (*)    All other components within normal limits  CBC WITH DIFFERENTIAL/PLATELET  TROPONIN I (HIGH SENSITIVITY)  TROPONIN I (HIGH SENSITIVITY)    EKG EKG Interpretation  Date/Time:  Monday March 22 2021 09:59:02 EST Ventricular Rate:  133 PR Interval:  137 QRS Duration: 71 QT Interval:  282 QTC Calculation: 420 R Axis:   47 Text Interpretation: new Sinus tachycardia Confirmed by Blanchie Dessert [phone]) on 03/22/2021 10:24:54 AM  Radiology CT ABDOMEN PELVIS W  CONTRAST  Result Date: 03/22/2021 CLINICAL DATA:  Abdominal pain. Flank pain. Decreased appetite. History of breast cancer. EXAM: CT ABDOMEN AND PELVIS WITH CONTRAST TECHNIQUE: Multidetector CT imaging of the abdomen and pelvis was performed using the standard protocol following bolus administration of intravenous contrast. CONTRAST:  132mL OMNIPAQUE IOHEXOL 300 MG/ML  SOLN COMPARISON:  Chest CT of 01/25/2021.  No prior abdominopelvic CTs. FINDINGS: Lower chest: Clear lung bases. Small right pleural effusion is new since the prior chest CT. Low left mediastinal necrotic adenopathy, including at 8 mm on 03/02. Central left breast mass of 2.6 x 2.8 cm on 01/02 is new or more well-defined than on the prior. Necrotic nodes or nodules within the left breast and along the left pectoralis musculature including at 1.3 cm on 07/02. Hepatobiliary: Hepatomegaly at 23 cm craniocaudal. Bilateral hepatic metastasis. A dominant right hepatic lobe mass measures on the order of 10.7 x 6.9 cm on 15/2. Index segment 3 lesion measures 1.1 cm on 23/2. Probable vicarious excretion of contrast by the gallbladder. Gallbladder wall thickening at 5 mm without specific evidence of acute cholecystitis. No biliary duct dilatation. Pancreas: Normal, without mass or ductal dilatation. Spleen: Normal in size, without focal abnormality. Adrenals/Urinary Tract: Mild right adrenal nodularity. Normal left adrenal gland. Normal kidneys, without hydronephrosis. Normal urinary bladder. Stomach/Bowel: Gastric antral underdistention. Colonic stool burden suggests constipation. Cecum extends into the upper central pelvis. Normal terminal ileum and appendix. Normal small bowel. Vascular/Lymphatic: Aortic atherosclerosis. Left periaortic 11 mm abdominal retroperitoneal node on 36/2. Porta hepatis adenopathy is mild at 1.3 cm on 26/2. There  is also enlarged retrocaval node at 1.2 cm on 28/2. 9 mm retrocrural node is newly enlarged since the prior chest CT.  No pelvic sidewall adenopathy. Reproductive: Normal uterus and adnexa. Prominent gonadal veins, as can be seen with pelvic congestion syndrome. Other: Trace free pelvic fluid is likely physiologic. Nonspecific mild edema and minimal fluid within the anterior pararenal space of the right hemiabdomen on 48/2. Musculoskeletal: Degenerative disc disease at the lumbosacral junction. IMPRESSION: 1. Widespread metastatic disease, including to the liver, thoracoabdominal nodes, left breast/chest wall. 2. No bowel obstruction or other acute complication. 3. Trace edema and fluid within the right abdominal anterior perirenal space. No source identified. This could be correlated with pancreatic enzyme levels, to exclude unlikely pancreatic source. 4. Gallbladder wall thickening, without specific evidence of acute cholecystitis. Correlate with right upper quadrant symptoms and possibly ultrasound. 5. Small right pleural effusion. 6. Left breast mass is more well-defined today and may represent recurrent or residual primary, given clinical history. Electronically Signed   By: Abigail Miyamoto M.D.   On: 03/22/2021 11:57    Procedures Procedures   Medications Ordered in ED Medications  lactated ringers bolus 1,000 mL (1,000 mLs Intravenous New Bag/Given 03/22/21 1007)  morphine 4 MG/ML injection 4 mg (4 mg Intravenous Given 03/22/21 1116)  ondansetron (ZOFRAN) injection 4 mg (4 mg Intravenous Given 03/22/21 1113)  iohexol (OMNIPAQUE) 300 MG/ML solution 100 mL (100 mLs Intravenous Contrast Given 03/22/21 1132)    ED Course  I have reviewed the triage vital signs and the nursing notes.  Pertinent labs & imaging results that were available during my care of the patient were reviewed by me and considered in my medical decision making (see chart for details).    MDM Rules/Calculators/A&P                         Patient is a 45 year old female presenting today due to ongoing back pain.  Also having decreased appetite  in minimal bowel movements in the last week.  She did recently have her wisdom teeth removed and had been taking penicillin and pain medication but has not taken any pain medication today.  The location of her back pain is more consistent with sciatica and she does report that it radiates down her leg intermittently however she has also been complaining of feeling very gassy, poor bowel movements and poor appetite.  Patient is tachycardic today at 150 and she has noted some shortness of breath with activity in the last month.  She had a CTA of her chest done 1 month ago that showed no evidence of PE.  On exam here no significant abdominal pain except for some minimal right upper quadrant pain.  Concern for possible acute abdominal pathology including cholelithiasis.  Patient's LFTs and hemoglobin 1 month ago were normal but concern for developing anemia or acute bony pathology.  Patient is currently not having pain.  We will give IV fluids.  I ordered and evaluated the EKG which shows sinus tachycardia without evidence of dysrhythmia.  Labs are pending.  1:02 PM Patient's labs show a stable hemoglobin of 11 and normal white count, CMP today with mildly elevated LFTs and urine without acute findings.  Lipase is within normal limits and troponin is negative.  CT today unfortunately shows widespread metastatic disease including to the liver thoracicoabdominal nodes left breast and chest wall.  There is no evidence of obstruction or acute complication.  There is trace edema of fluid  within the right abdominal anterior perirenal space and some wall thickening of the gallbladder without acute findings.  Findings discussed with the patient.  Questions were answered and she reported she would follow-up with her oncologist.  She was given pain control.  HR had improved to 110 prior to taking off the monitor.  MDM   Amount and/or Complexity of Data Reviewed Clinical lab tests: ordered and reviewed Tests in the  radiology section of CPT: ordered and reviewed Tests in the medicine section of CPT: ordered and reviewed Independent visualization of images, tracings, or specimens: yes       Final Clinical Impression(s) / ED Diagnoses Final diagnoses:  Malignant neoplasm metastatic to liver [facility])    Rx / DC Orders ED Discharge Orders          Ordered    HYDROcodone-acetaminophen (NORCO/VICODIN) 5-325 MG tablet  Every 6 hours PRN        03/22/21 1306             Blanchie Dessert, MD 03/22/21 1306

## 2021-03-23 ENCOUNTER — Telehealth: Payer: Self-pay | Admitting: *Deleted

## 2021-03-23 NOTE — Telephone Encounter (Signed)
Per Annabelle Harman called to f/u with pt on ED visit and see if she would like to come in for an [facility] visit. Pt declined and stated she had 0 pain in back after taking prescribed pain med. Pt also stated that she has upcoming appts at [facility] imaging and would like to wait until all scans were done before having a f/u with oncology provider. Advised pt if there was anything she needed to call the office. Pt verbalized understanding.

## 2021-03-24 ENCOUNTER — Other Ambulatory Visit: Payer: Self-pay | Admitting: Hematology and Oncology

## 2021-03-24 ENCOUNTER — Other Ambulatory Visit: Payer: Self-pay

## 2021-03-24 ENCOUNTER — Ambulatory Visit
Admission: RE | Admit: 2021-03-24 | Discharge: 2021-03-24 | Disposition: A | Discharge status: Home / Self Care | Payer: BC Managed Care – PPO | Source: Ambulatory Visit | Attending: Hematology and Oncology | Admitting: Hematology and Oncology

## 2021-03-24 DIAGNOSIS — C50412 Malignant neoplasm of upper-outer quadrant of left female breast: Secondary | ICD-10-CM

## 2021-03-24 DIAGNOSIS — Z170 Estrogen receptor positive status [ER+]: Secondary | ICD-10-CM

## 2021-03-24 DIAGNOSIS — N6320 Unspecified lump in the left breast, unspecified quadrant: Secondary | ICD-10-CM

## 2021-03-24 DIAGNOSIS — R2232 Localized swelling, mass and lump, left upper limb: Secondary | ICD-10-CM

## 2021-03-24 IMAGING — MG DIGITAL DIAGNOSTIC BILAT W/ TOMO W/ CAD
6 of 13 series · 6 of 37 positions shown · non-contrast
Comparison: [DATE] and [DATE] CTs.
COMPARISON: [DATE] and [DATE] CTs.

Addendum:
CLINICAL DATA: 45-year-old female with history of LEFT breast
cancer with LEFT lumpectomy and chemotherapy treatment in [IX]. New
LEFT breast masses, enlarged LEFT axillary lymph nodes and
metastatic disease involving the liver and thoracoabdominal lymph
nodes on recent CT scans.

EXAM:
DIGITAL DIAGNOSTIC BILATERAL MAMMOGRAM WITH TOMOSYNTHESIS AND CAD;
ULTRASOUND LEFT BREAST LIMITED
TECHNIQUE: Bilateral digital diagnostic mammography and breast tomosynthesis
was performed. The images were evaluated with computer-aided
detection.; Targeted ultrasound examination of the left breast was
performed.

[L MLO]
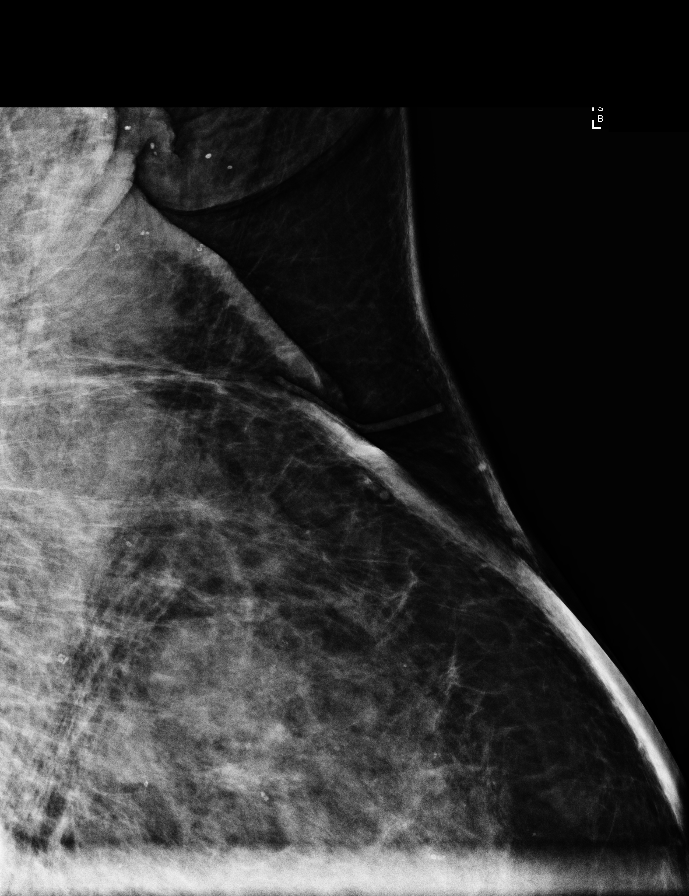

[L CC synth-2D]
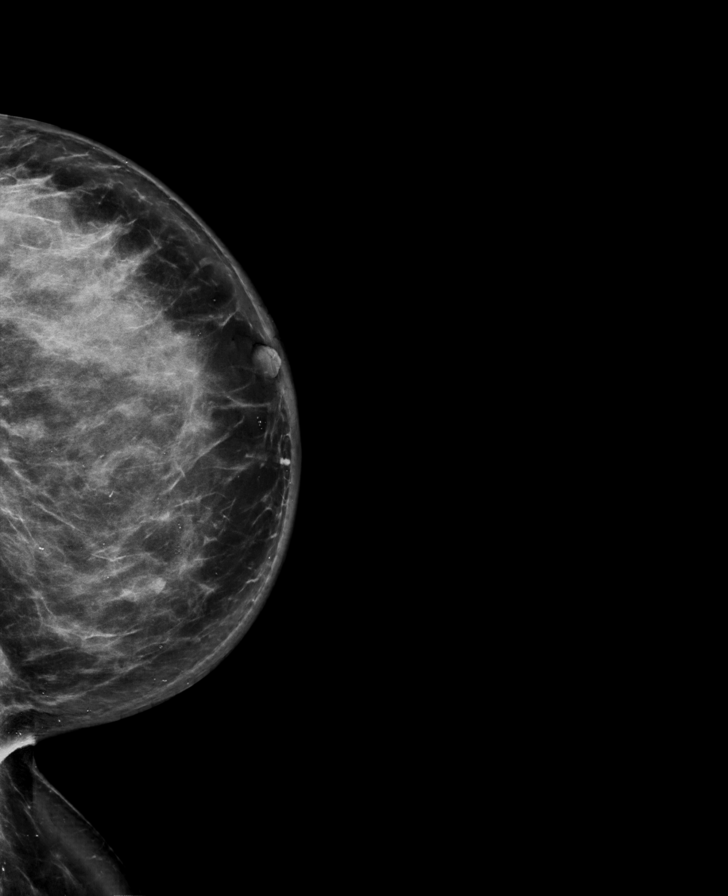

[R MLO synth-2D (1 of 2)]
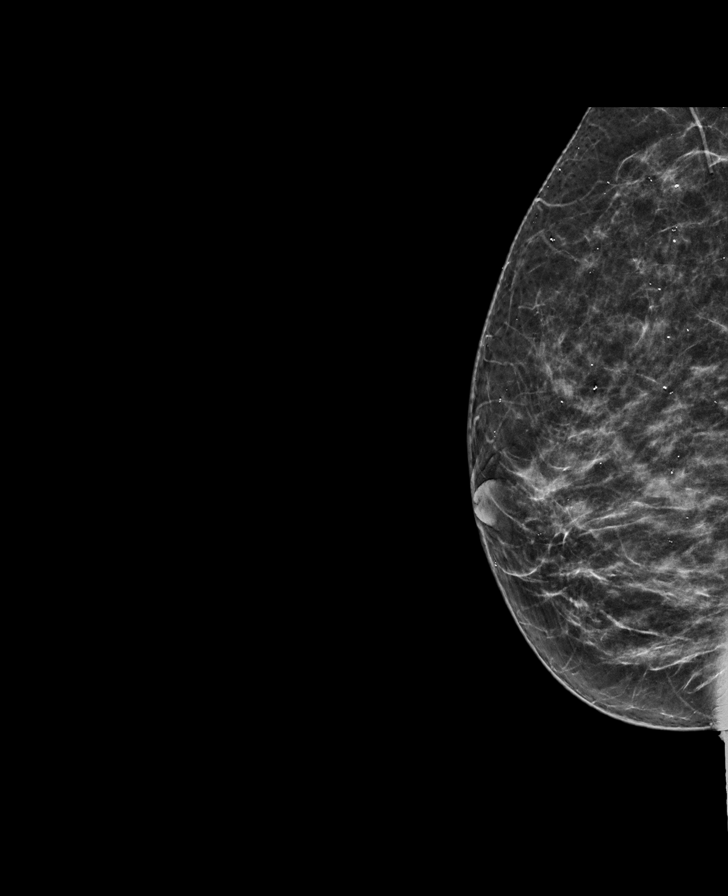

[R MLO synth-2D (2 of 2)]
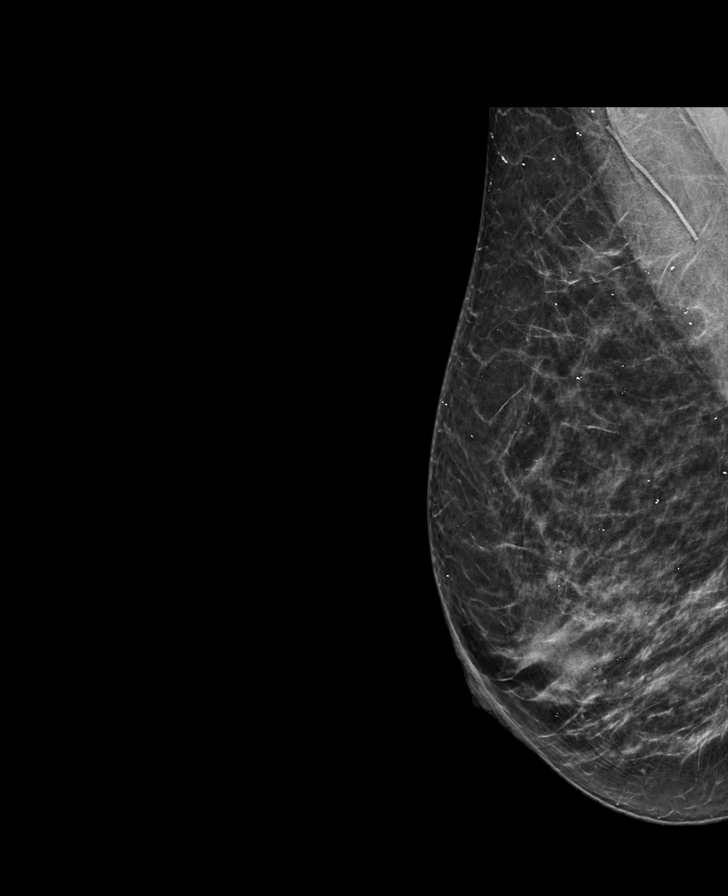

[R CC synth-2D]
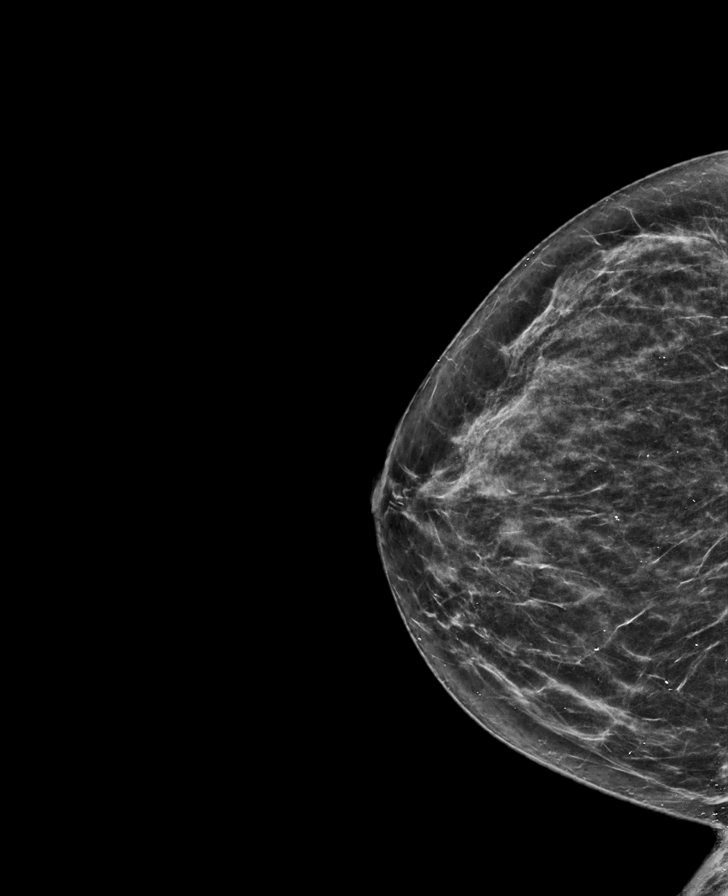

[L MLO synth-2D]
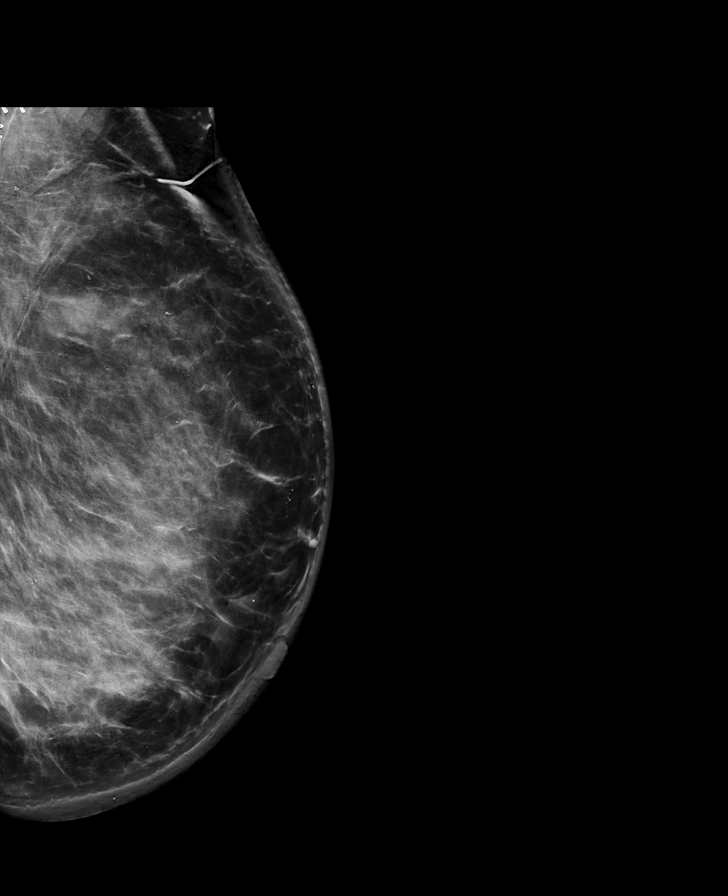

[6 of 37 positions shown; findings below may reference images not displayed]

[DATE] and prior
mammograms. [DATE] breast MR.

ACR Breast Density Category c: The breast tissue is heterogeneously
dense, which may obscure small masses.
FINDINGS: 2D/3D full field views of both breasts and a magnification view of
the lumpectomy site are performed.

New increased density within the central and OUTER LEFT breast
identified with decreased volume of the LEFT breast.

No new or suspicious RIGHT breast findings are noted.

On physical exam, the RIGHT breast enlarged in there is a very firm
palpable thickening within the OUTER central LEFT breast.

Targeted ultrasound of the LEFT breast is performed, showing
heterogeneous tissue throughout the majority of the LEFT breast.

Discrete findings on the LEFT are as follows:

A 4.6 x 2 x 3 cm irregular hypoechoic LEFT breast mass deep at the 3
o'clock position 1 cm from the nipple.

A 0.7 x 1 x 1.4 cm hypoechoic LEFT breast area/mass within the
posterior 6 o'clock position 2 cm from the nipple.

A 0.8 cm ill-defined LEFT breast hypoechoic mass at the 8 o'clock
position 4 cm from the nipple.

A 2.2 x 1.5 x 2.1 cm heterogeneous superficial mass in the LEFT
axilla.

At least 1 abnormal LEFT axillary lymph node, with cortical
thickening of 1 cm.

Other enhancing masses within the LEFT breast identified on recent
CT are not well visualized sonographic due to heterogeneity of the
LEFT breast parenchyma.
IMPRESSION: 1. Highly suspicious masses within the LEFT breast and LEFT axilla,
and at least 1 abnormal LEFT axillary lymph node. Tissue sampling of
the largest highly suspicious mass which measures 4.6 cm at the 3
o'clock position 1 cm from the nipple and tissue sampling of the
abnormal LEFT axillary lymph node are recommended, as clinically
indicated.
2. Other highly suspicious smaller masses within the LEFT breast
identified on recent CT are not well visualized mammographically or
sonographically given breast tissue density/heterogeneity.
3.  (

RECOMMENDATION:
Ultrasound-guided biopsy of 4.6 cm OUTER central LEFT breast mass
and ultrasound-guided biopsy of the abnormal LEFT axillary lymph
node, which have been scheduled. If tissue sampling of the LEFT
breast/LEFT axilla is not warranted clinically (given hepatic and
thoracoabdominal lymph node metastases), then these procedures can
be canceled.

Breast MRI may be helpful given high likelihood of diffuse
malignancy within the LEFT breast.

I have discussed the findings and recommendations with the patient.
If applicable, a reminder letter will be sent to the patient
regarding the next appointment.

BI-RADS CATEGORY  5: Highly suggestive of malignancy.

ADDENDUM:
In the physical exam portion, the statement should read:

On physical examination, the LEFT breast is enlarged and there is a
very firm palpable thickening within the OUTER central LEFT breast.

*** End of Addendum ***
[DATE] and prior
mammograms. [DATE] breast MR.

ACR Breast Density Category c: The breast tissue is heterogeneously
dense, which may obscure small masses.
FINDINGS: 2D/3D full field views of both breasts and a magnification view of
the lumpectomy site are performed.

New increased density within the central and OUTER LEFT breast
identified with decreased volume of the LEFT breast.

No new or suspicious RIGHT breast findings are noted.

On physical exam, the RIGHT breast enlarged in there is a very firm
palpable thickening within the OUTER central LEFT breast.

Targeted ultrasound of the LEFT breast is performed, showing
heterogeneous tissue throughout the majority of the LEFT breast.

Discrete findings on the LEFT are as follows:

A 4.6 x 2 x 3 cm irregular hypoechoic LEFT breast mass deep at the 3
o'clock position 1 cm from the nipple.

A 0.7 x 1 x 1.4 cm hypoechoic LEFT breast area/mass within the
posterior 6 o'clock position 2 cm from the nipple.

A 0.8 cm ill-defined LEFT breast hypoechoic mass at the 8 o'clock
position 4 cm from the nipple.

A 2.2 x 1.5 x 2.1 cm heterogeneous superficial mass in the LEFT
axilla.

At least 1 abnormal LEFT axillary lymph node, with cortical
thickening of 1 cm.

Other enhancing masses within the LEFT breast identified on recent
CT are not well visualized sonographic due to heterogeneity of the
LEFT breast parenchyma.
IMPRESSION: 1. Highly suspicious masses within the LEFT breast and LEFT axilla,
and at least 1 abnormal LEFT axillary lymph node. Tissue sampling of
the largest highly suspicious mass which measures 4.6 cm at the 3
o'clock position 1 cm from the nipple and tissue sampling of the
abnormal LEFT axillary lymph node are recommended, as clinically
indicated.
2. Other highly suspicious smaller masses within the LEFT breast
identified on recent CT are not well visualized mammographically or
sonographically given breast tissue density/heterogeneity.
3.  (

RECOMMENDATION:
Ultrasound-guided biopsy of 4.6 cm OUTER central LEFT breast mass
and ultrasound-guided biopsy of the abnormal LEFT axillary lymph
node, which have been scheduled. If tissue sampling of the LEFT
breast/LEFT axilla is not warranted clinically (given hepatic and
thoracoabdominal lymph node metastases), then these procedures can
be canceled.

Breast MRI may be helpful given high likelihood of diffuse
malignancy within the LEFT breast.

I have discussed the findings and recommendations with the patient.
If applicable, a reminder letter will be sent to the patient
regarding the next appointment.

BI-RADS CATEGORY  5: Highly suggestive of malignancy.

## 2021-03-24 IMAGING — US US BREAST*L* LIMITED INC AXILLA
1 series · 15 of 22 positions shown · non-contrast
Comparison: [DATE] and [DATE] CTs.
COMPARISON: [DATE] and [DATE] CTs.

Addendum:
CLINICAL DATA: 45-year-old female with history of LEFT breast
cancer with LEFT lumpectomy and chemotherapy treatment in [IX]. New
LEFT breast masses, enlarged LEFT axillary lymph nodes and
metastatic disease involving the liver and thoracoabdominal lymph
nodes on recent CT scans.

EXAM:
DIGITAL DIAGNOSTIC BILATERAL MAMMOGRAM WITH TOMOSYNTHESIS AND CAD;
ULTRASOUND LEFT BREAST LIMITED
TECHNIQUE: Bilateral digital diagnostic mammography and breast tomosynthesis
was performed. The images were evaluated with computer-aided
detection.; Targeted ultrasound examination of the left breast was
performed.

[Series 1: us breast*left* limited inc axilla · 0.09mm/px · 15 of 22 slices shown]
[im 1/22]
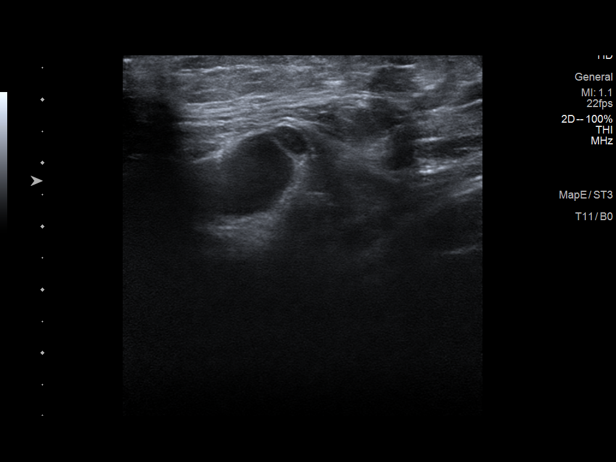
[im 3/22]
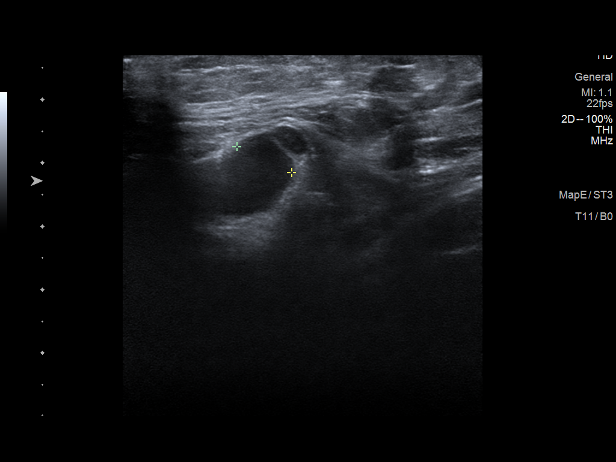
[im 4/22]
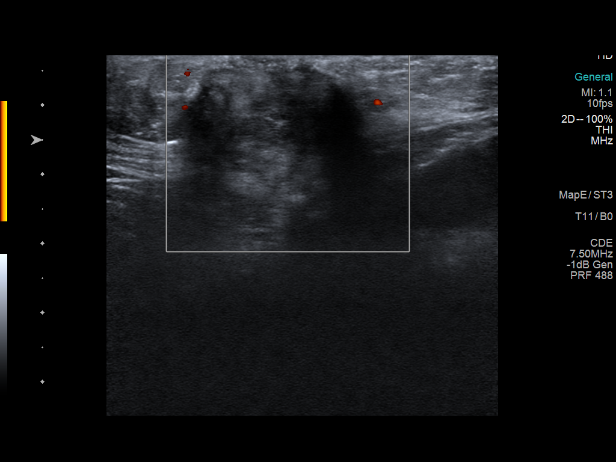
[im 6/22]
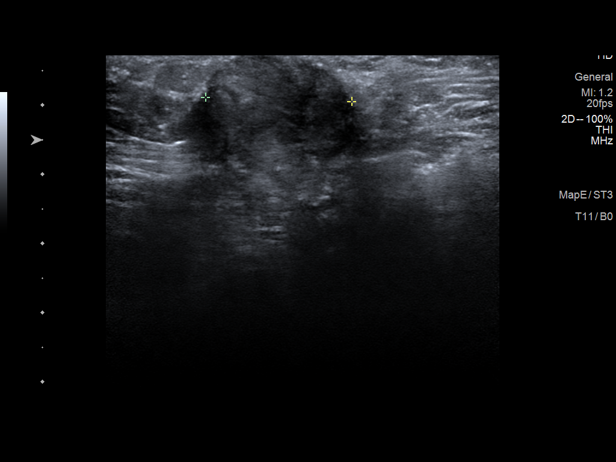
[im 7/22]
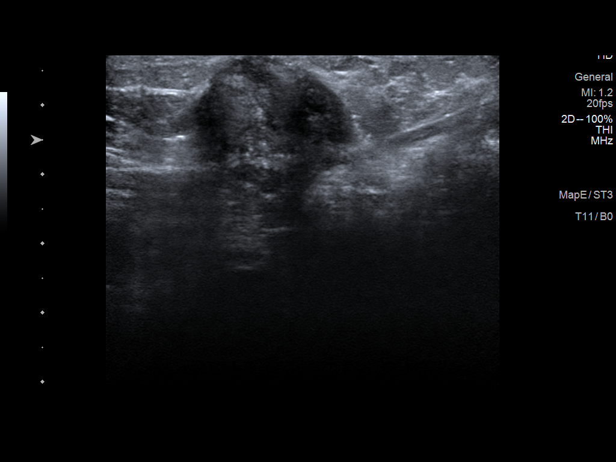
[im 9/22]
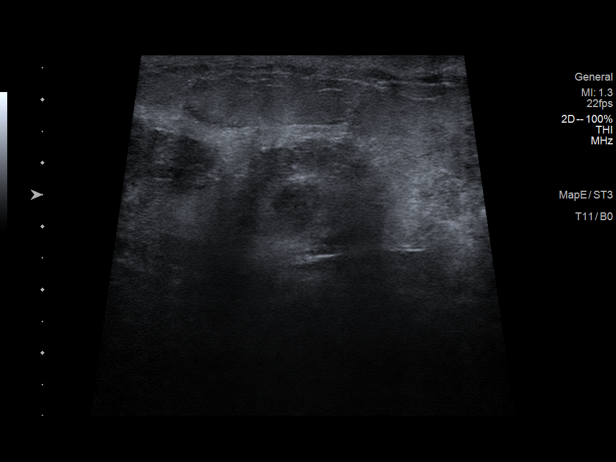
[im 10/22]
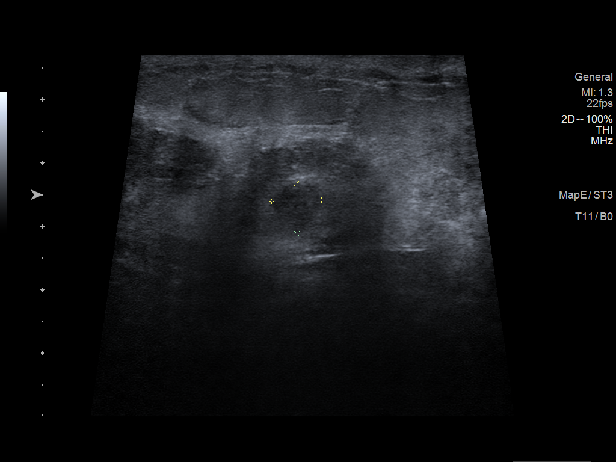
[im 12/22]
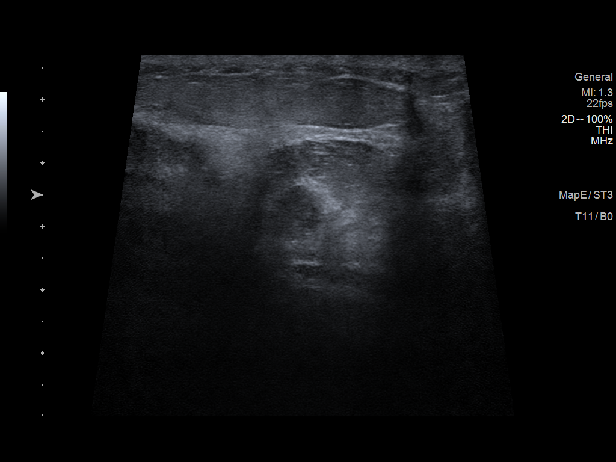
[im 13/22]
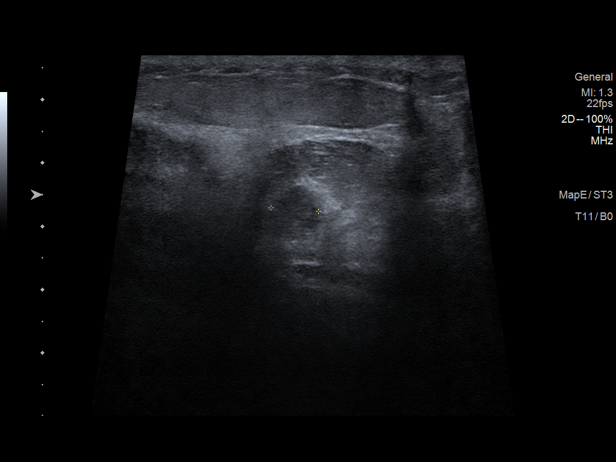
[im 14/22]
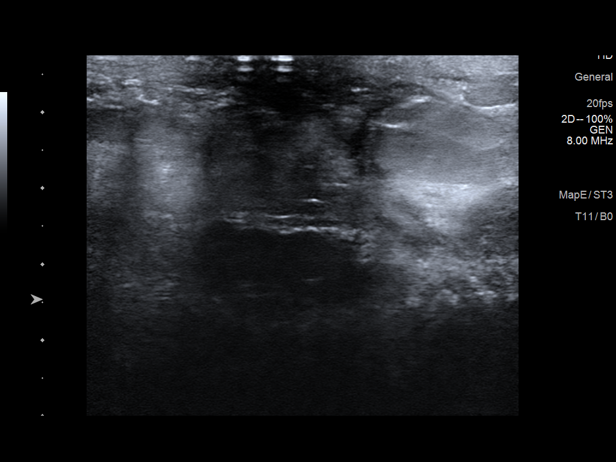
[im 16/22]
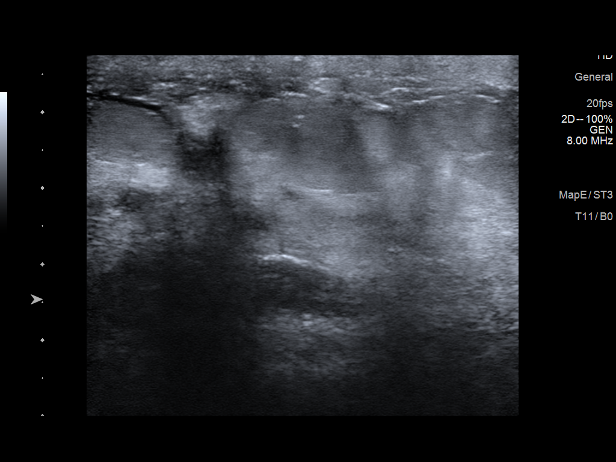
[im 17/22]
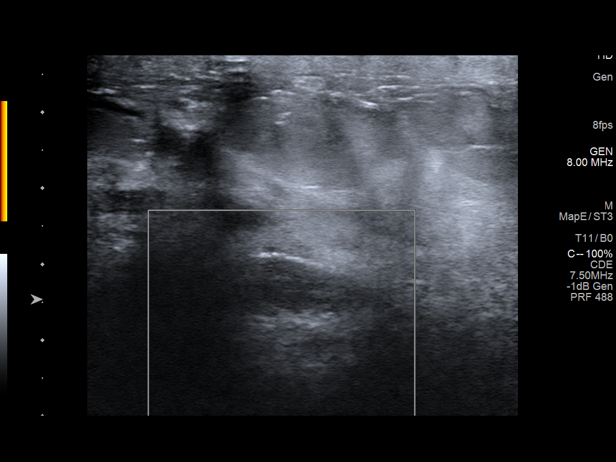
[im 19/22]
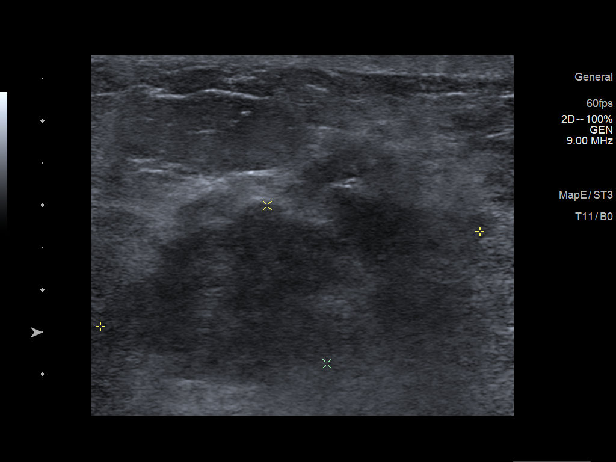
[im 20/22]
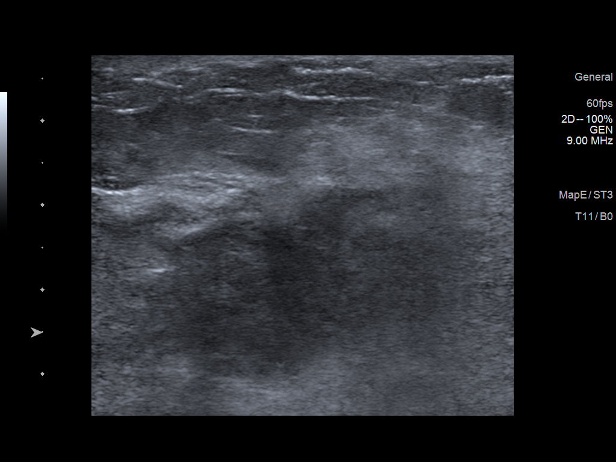
[im 22/22]
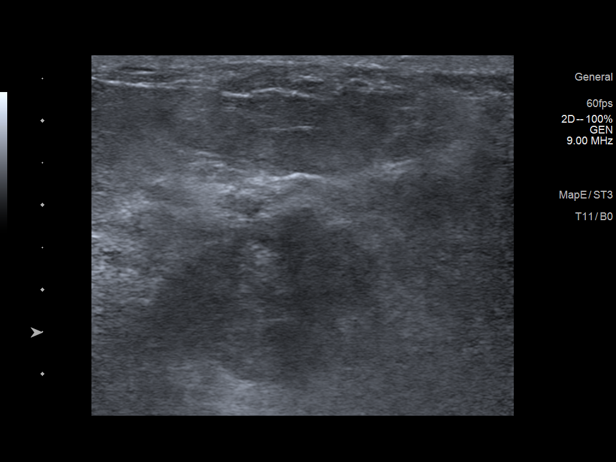

[15 of 22 positions shown; findings below may reference images not displayed]

[DATE] and prior
mammograms. [DATE] breast MR.

ACR Breast Density Category c: The breast tissue is heterogeneously
dense, which may obscure small masses.
FINDINGS: 2D/3D full field views of both breasts and a magnification view of
the lumpectomy site are performed.

New increased density within the central and OUTER LEFT breast
identified with decreased volume of the LEFT breast.

No new or suspicious RIGHT breast findings are noted.

On physical exam, the RIGHT breast enlarged in there is a very firm
palpable thickening within the OUTER central LEFT breast.

Targeted ultrasound of the LEFT breast is performed, showing
heterogeneous tissue throughout the majority of the LEFT breast.

Discrete findings on the LEFT are as follows:

A 4.6 x 2 x 3 cm irregular hypoechoic LEFT breast mass deep at the 3
o'clock position 1 cm from the nipple.

A 0.7 x 1 x 1.4 cm hypoechoic LEFT breast area/mass within the
posterior 6 o'clock position 2 cm from the nipple.

A 0.8 cm ill-defined LEFT breast hypoechoic mass at the 8 o'clock
position 4 cm from the nipple.

A 2.2 x 1.5 x 2.1 cm heterogeneous superficial mass in the LEFT
axilla.

At least 1 abnormal LEFT axillary lymph node, with cortical
thickening of 1 cm.

Other enhancing masses within the LEFT breast identified on recent
CT are not well visualized sonographic due to heterogeneity of the
LEFT breast parenchyma.
IMPRESSION: 1. Highly suspicious masses within the LEFT breast and LEFT axilla,
and at least 1 abnormal LEFT axillary lymph node. Tissue sampling of
the largest highly suspicious mass which measures 4.6 cm at the 3
o'clock position 1 cm from the nipple and tissue sampling of the
abnormal LEFT axillary lymph node are recommended, as clinically
indicated.
2. Other highly suspicious smaller masses within the LEFT breast
identified on recent CT are not well visualized mammographically or
sonographically given breast tissue density/heterogeneity.
3.  (

RECOMMENDATION:
Ultrasound-guided biopsy of 4.6 cm OUTER central LEFT breast mass
and ultrasound-guided biopsy of the abnormal LEFT axillary lymph
node, which have been scheduled. If tissue sampling of the LEFT
breast/LEFT axilla is not warranted clinically (given hepatic and
thoracoabdominal lymph node metastases), then these procedures can
be canceled.

Breast MRI may be helpful given high likelihood of diffuse
malignancy within the LEFT breast.

I have discussed the findings and recommendations with the patient.
If applicable, a reminder letter will be sent to the patient
regarding the next appointment.

BI-RADS CATEGORY  5: Highly suggestive of malignancy.

ADDENDUM:
In the physical exam portion, the statement should read:

On physical examination, the LEFT breast is enlarged and there is a
very firm palpable thickening within the OUTER central LEFT breast.

*** End of Addendum ***
[DATE] and prior
mammograms. [DATE] breast MR.

ACR Breast Density Category c: The breast tissue is heterogeneously
dense, which may obscure small masses.
FINDINGS: 2D/3D full field views of both breasts and a magnification view of
the lumpectomy site are performed.

New increased density within the central and OUTER LEFT breast
identified with decreased volume of the LEFT breast.

No new or suspicious RIGHT breast findings are noted.

On physical exam, the RIGHT breast enlarged in there is a very firm
palpable thickening within the OUTER central LEFT breast.

Targeted ultrasound of the LEFT breast is performed, showing
heterogeneous tissue throughout the majority of the LEFT breast.

Discrete findings on the LEFT are as follows:

A 4.6 x 2 x 3 cm irregular hypoechoic LEFT breast mass deep at the 3
o'clock position 1 cm from the nipple.

A 0.7 x 1 x 1.4 cm hypoechoic LEFT breast area/mass within the
posterior 6 o'clock position 2 cm from the nipple.

A 0.8 cm ill-defined LEFT breast hypoechoic mass at the 8 o'clock
position 4 cm from the nipple.

A 2.2 x 1.5 x 2.1 cm heterogeneous superficial mass in the LEFT
axilla.

At least 1 abnormal LEFT axillary lymph node, with cortical
thickening of 1 cm.

Other enhancing masses within the LEFT breast identified on recent
CT are not well visualized sonographic due to heterogeneity of the
LEFT breast parenchyma.
IMPRESSION: 1. Highly suspicious masses within the LEFT breast and LEFT axilla,
and at least 1 abnormal LEFT axillary lymph node. Tissue sampling of
the largest highly suspicious mass which measures 4.6 cm at the 3
o'clock position 1 cm from the nipple and tissue sampling of the
abnormal LEFT axillary lymph node are recommended, as clinically
indicated.
2. Other highly suspicious smaller masses within the LEFT breast
identified on recent CT are not well visualized mammographically or
sonographically given breast tissue density/heterogeneity.
3.  (

RECOMMENDATION:
Ultrasound-guided biopsy of 4.6 cm OUTER central LEFT breast mass
and ultrasound-guided biopsy of the abnormal LEFT axillary lymph
node, which have been scheduled. If tissue sampling of the LEFT
breast/LEFT axilla is not warranted clinically (given hepatic and
thoracoabdominal lymph node metastases), then these procedures can
be canceled.

Breast MRI may be helpful given high likelihood of diffuse
malignancy within the LEFT breast.

I have discussed the findings and recommendations with the patient.
If applicable, a reminder letter will be sent to the patient
regarding the next appointment.

BI-RADS CATEGORY  5: Highly suggestive of malignancy.

## 2021-03-25 ENCOUNTER — Other Ambulatory Visit: Payer: Self-pay | Admitting: *Deleted

## 2021-03-25 ENCOUNTER — Telehealth: Payer: Self-pay | Admitting: *Deleted

## 2021-03-25 DIAGNOSIS — Z170 Estrogen receptor positive status [ER+]: Secondary | ICD-10-CM

## 2021-03-25 NOTE — Telephone Encounter (Signed)
Per Annabelle Harman, called pt with message below. Pt confirmed appt for labs and office visit for 12/30

## 2021-03-25 NOTE — Telephone Encounter (Signed)
-----   Message from Gardenia Phlegm, NP sent at 03/25/2021  8:48 AM EST ----- Will you offer patient 115 appt on my schedule tomorrow to see me and VG with labs prior.  Please get CBC, CMET, and [id number] ----- Message ----- From: Interface, Rad Results In Sent: 03/24/2021   5:39 PM EST To: Gardenia Phlegm, NP

## 2021-03-26 ENCOUNTER — Other Ambulatory Visit: Payer: Self-pay

## 2021-03-26 ENCOUNTER — Inpatient Hospital Stay: Payer: BC Managed Care – PPO | Attending: Hematology and Oncology

## 2021-03-26 ENCOUNTER — Inpatient Hospital Stay (HOSPITAL_BASED_OUTPATIENT_CLINIC_OR_DEPARTMENT_OTHER): Payer: BC Managed Care – PPO | Admitting: Adult Health

## 2021-03-26 ENCOUNTER — Inpatient Hospital Stay: Payer: BC Managed Care – PPO

## 2021-03-26 VITALS — BP 120/87 | HR 129 | Temp 97.8°F | Resp 20 | Ht 63.0 in | Wt 169.0 lb

## 2021-03-26 DIAGNOSIS — Z9221 Personal history of antineoplastic chemotherapy: Secondary | ICD-10-CM | POA: Diagnosis not present

## 2021-03-26 DIAGNOSIS — C50412 Malignant neoplasm of upper-outer quadrant of left female breast: Secondary | ICD-10-CM | POA: Insufficient documentation

## 2021-03-26 DIAGNOSIS — Z800 Family history of malignant neoplasm of digestive organs: Secondary | ICD-10-CM | POA: Diagnosis not present

## 2021-03-26 DIAGNOSIS — Z803 Family history of malignant neoplasm of breast: Secondary | ICD-10-CM | POA: Insufficient documentation

## 2021-03-26 DIAGNOSIS — C787 Secondary malignant neoplasm of liver and intrahepatic bile duct: Secondary | ICD-10-CM | POA: Diagnosis not present

## 2021-03-26 DIAGNOSIS — Z170 Estrogen receptor positive status [ER+]: Secondary | ICD-10-CM | POA: Diagnosis not present

## 2021-03-26 DIAGNOSIS — G893 Neoplasm related pain (acute) (chronic): Secondary | ICD-10-CM | POA: Insufficient documentation

## 2021-03-26 DIAGNOSIS — R630 Anorexia: Secondary | ICD-10-CM | POA: Diagnosis not present

## 2021-03-26 DIAGNOSIS — Z923 Personal history of irradiation: Secondary | ICD-10-CM | POA: Insufficient documentation

## 2021-03-26 DIAGNOSIS — Z79810 Long term (current) use of selective estrogen receptor modulators (SERMs): Secondary | ICD-10-CM | POA: Diagnosis present

## 2021-03-26 DIAGNOSIS — Z8042 Family history of malignant neoplasm of prostate: Secondary | ICD-10-CM | POA: Insufficient documentation

## 2021-03-26 LAB — CBC WITH DIFFERENTIAL (CANCER CENTER ONLY)
Abs Immature Granulocytes: 0.01 10*3/uL (ref 0.00–0.07)
Basophils Absolute: 0 10*3/uL (ref 0.0–0.1)
Basophils Relative: 0 %
Eosinophils Absolute: 0 10*3/uL (ref 0.0–0.5)
Eosinophils Relative: 0 %
HCT: 34.4 % — ABNORMAL LOW (ref 36.0–46.0)
Hemoglobin: 12 g/dL (ref 12.0–15.0)
Immature Granulocytes: 0 %
Lymphocytes Relative: 19 %
Lymphs Abs: 1.2 10*3/uL (ref 0.7–4.0)
MCH: 29.9 pg (ref 26.0–34.0)
MCHC: 34.9 g/dL (ref 30.0–36.0)
MCV: 85.6 fL (ref 80.0–100.0)
Monocytes Absolute: 0.7 10*3/uL (ref 0.1–1.0)
Monocytes Relative: 11 %
Neutro Abs: 4.2 10*3/uL (ref 1.7–7.7)
Neutrophils Relative %: 70 %
Platelet Count: 266 10*3/uL (ref 150–400)
RBC: 4.02 MIL/uL (ref 3.87–5.11)
RDW: 12.1 % (ref 11.5–15.5)
WBC Count: 6.1 10*3/uL (ref 4.0–10.5)
nRBC: 0 % (ref 0.0–0.2)

## 2021-03-26 LAB — CMP (CANCER CENTER ONLY)
ALT: 308 U/L (ref 0–44)
AST: 284 U/L (ref 15–41)
Albumin: 3.4 g/dL — ABNORMAL LOW (ref 3.5–5.0)
Alkaline Phosphatase: 249 U/L — ABNORMAL HIGH (ref 38–126)
Anion gap: 11 (ref 5–15)
BUN: 5 mg/dL — ABNORMAL LOW (ref 6–20)
CO2: 26 mmol/L (ref 22–32)
Calcium: 9.2 mg/dL (ref 8.9–10.3)
Chloride: 92 mmol/L — ABNORMAL LOW (ref 98–111)
Creatinine: 0.61 mg/dL (ref 0.44–1.00)
GFR, Estimated: >60 mL/min (ref >60)
Glucose, Bld: 88 mg/dL (ref 70–99)
Potassium: 3.7 mmol/L (ref 3.5–5.1)
Sodium: 129 mmol/L — ABNORMAL LOW (ref 135–145)
Total Bilirubin: 4.3 mg/dL (ref 0.3–1.2)
Total Protein: 8 g/dL (ref 6.5–8.1)

## 2021-03-26 MED ORDER — DEXAMETHASONE 4 MG PO TABS: 4.0000 mg | ORAL_TABLET | Freq: Two times a day (BID) | ORAL | 1 refills | Status: DC

## 2021-03-26 MED ORDER — OXYCODONE HCL 10 MG PO TABS: 10.0000 mg | ORAL_TABLET | Freq: Four times a day (QID) | ORAL | 0 refills | Status: DC | PRN

## 2021-03-26 MED ORDER — OXYCODONE HCL 5 MG PO TABS: 5.0000 mg | ORAL_TABLET | Freq: Once | ORAL | Status: DC

## 2021-03-26 MED ORDER — OXYCODONE HCL 5 MG PO TABS
5.0000 mg | ORAL_TABLET | Freq: Once | ORAL | Status: AC
  Administered 2021-03-26: 14:00:00 5 mg via ORAL

## 2021-03-26 MED ORDER — OXYCODONE HCL 5 MG PO TABS
5.0000 mg | ORAL_TABLET | Freq: Once | ORAL | Status: DC
  Filled 2021-03-26: qty 1

## 2021-03-26 NOTE — Progress Notes (Addendum)
[facility] Follow up:    [name], NP [place] [address]   DIAGNOSIS:  Cancer Staging  Malignant neoplasm of upper-outer quadrant of left breast in female, estrogen receptor positive ([place]) Staging form: Breast, AJCC 8th Edition - Clinical stage from 01/23/2019: Stage IIA (cT2, cN1(f), cM0, G2, ER+, PR-, HER2+) - Signed by Nicholas Lose, MD on 01/23/2019 Stage prefix: Initial diagnosis Method of lymph node assessment: Core biopsy Histologic grading system: 3 grade system - Pathologic stage from 06/20/2019: No Stage Recommended (ypT0, pN1, cM0, ER+, PR-, HER2-) - Signed by Nicholas Lose, MD on 06/20/2019 Stage prefix: Post-therapy   SUMMARY OF ONCOLOGIC HISTORY: Oncology History  Malignant neoplasm of upper-outer quadrant of left breast in female, estrogen receptor positive ([place])  01/18/2019 Initial Diagnosis   Patient palpated a left breast lump for 2 weeks. Mammogram showed a 2.3cm left breast mass at the 12 o'clock position, with 1 enlarged left axillary lymph node measuring 3.5cm. 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   01/23/2019 Cancer Staging   Staging form: Breast, AJCC 8th Edition - Clinical stage from 01/23/2019: Stage IIA (cT2, cN1(f), cM0, G2, ER+, PR-, HER2+)   02/01/2019 Genetic Testing   Negative genetic testing. No pathogenic variants identified on the [facility] Common Hereditary Cancers Panel. The Common Hereditary Cancers Panel offered by [facility] includes sequencing and/or deletion duplication testing of the following 48 genes: APC, ATM, AXIN2, BARD1, BMPR1A, BRCA1, BRCA2, BRIP1, CDH1, CDKN2A (p14ARF), CDKN2A (p16INK4a), CKD4, CHEK2, CTNNA1, DICER1, EPCAM (Deletion/duplication testing only), GREM1 (promoter region deletion/duplication testing only), KIT, MEN1, MLH1, MSH2, MSH3, MSH6, MUTYH, NBN, NF1, NHTL1, PALB2, PDGFRA, PMS2, POLD1, POLE, PTEN, RAD50, RAD51C, RAD51D, RNF43, SDHB, SDHC,  SDHD, SMAD4, SMARCA4. STK11, TP53, TSC1, TSC2, and VHL.  The following genes were evaluated for sequence changes only: SDHA and HOXB13 c.251G>A variant only. The report date is 02/01/2019.    02/13/2019 - 05/28/2019 Chemotherapy   dexamethasone (DECADRON) 4 MG tablet, 4 mg (100 % of original dose 4 mg), Oral, Daily, 1 of 1 cycle, Start date: 01/23/2019, End date: 05/28/2019. Dose modification: 4 mg (original dose 4 mg, Cycle 0)  palonosetron (ALOXI) injection 0.25 mg, 0.25 mg, Intravenous,  Once, 6 of 6 cycles. Administration: 0.25 mg (02/13/2019), 0.25 mg (03/04/2019), 0.25 mg (05/07/2019), 0.25 mg (05/28/2019), 0.25 mg (03/26/2019), 0.25 mg (04/18/2019)  pegfilgrastim-jmdb (FULPHILA) injection 6 mg, 6 mg, Subcutaneous,  Once, 6 of 6 cycles. Administration: 6 mg (02/15/2019), 6 mg (03/06/2019), 6 mg (05/09/2019), 6 mg (05/30/2019), 6 mg (03/28/2019), 6 mg (04/20/2019)  CARBOplatin (PARAPLATIN) 700 mg in sodium chloride 0.9 % 250 mL chemo infusion, 700 mg (100 % of original dose 700 mg), Intravenous,  Once, 6 of 6 cycles. Dose modification: 700 mg (original dose 700 mg, Cycle 1). Administration: 700 mg (02/13/2019), 700 mg (03/04/2019), 700 mg (05/07/2019), 700 mg (05/28/2019), 700 mg (03/26/2019), 700 mg (04/18/2019)  DOCEtaxel (TAXOTERE) 140 mg in sodium chloride 0.9 % 250 mL chemo infusion, 75 mg/m2 = 140 mg, Intravenous,  Once, 6 of 6 cycles. Dose modification: 65 mg/m2 (original dose 75 mg/m2, Cycle 5, Reason: Dose not tolerated). Administration: 140 mg (02/13/2019), 140 mg (03/04/2019), 120 mg (05/07/2019), 120 mg (05/28/2019), 140 mg (03/26/2019), 140 mg (04/18/2019)  pertuzumab (PERJETA) 420 mg in sodium chloride 0.9 % 250 mL chemo infusion, 420 mg (100 % of original dose 420 mg), Intravenous, Once, 3 of 3 cycles. Dose modification: 420 mg (original dose 420 mg, Cycle 1, Reason: Provider  Judgment). Administration: 420 mg (02/13/2019), 420 mg (03/04/2019), 420 mg (03/26/2019)  fosaprepitant (EMEND) 150 mg  dexamethasone  (DECADRON) 12 mg in sodium chloride 0.9 % 145 mL IVPB, , Intravenous,  Once, 6 of 6 cycles. Administration:  (02/13/2019),  (03/04/2019),  (05/07/2019),  (05/28/2019),  (03/26/2019),  (04/18/2019)  trastuzumab-dkst (OGIVRI) 651 mg in sodium chloride 0.9 % 250 mL chemo infusion, 8 mg/kg = 651 mg, Intravenous,  Once, 6 of 6 cycles. Administration: 651 mg (02/13/2019), 483 mg (03/04/2019), 483 mg (05/07/2019), 483 mg (05/28/2019), 483 mg (03/26/2019), 483 mg (04/18/2019).   06/13/2019 Surgery   Left lumpectomy ([facility]) [phone]): no residual carcinoma, 2/3 left axillary lymph nodes positive for carcinoma. Lymphovascular space invasion was present.   06/20/2019 Cancer Staging   Staging form: Breast, AJCC 8th Edition - Pathologic stage from 06/20/2019: No Stage Recommended (ypT0, pN1, cM0, ER+, PR-, HER2-)   07/11/2019 Surgery   Axillary lymph node dissection ([facility]) ([id number]): 3/3 lymph nodes positive for cancer   08/19/2019 - 10/07/2019 Radiation Therapy   The patient initially received a dose of 50.4 Gy in 28 fractions to the breast and SCLV region using a 4-field approach. This was delivered using a 3-D conformal technique. The patient then received a boost to the seroma. This delivered an additional 10 Gy in 5 fractions using a 3-field photon boost technique. The total dose was 60.4 Gy.   09/2019 - 09/2029 Anti-estrogen oral therapy   Tamoxifen     CURRENT THERAPY: Tamoxifen daily  INTERVAL HISTORY: Gloriajean Dell Vazguez 45 y.o. female returns for evaluation of her breast cancer.  She had noted an approximate 22-monthhistory of lower back and abdominal pain.  This has become progressively worse and she visited the ER on March 22, 2021.  She underwent imaging which showed concern for metastasis to the bone and liver.  She underwent a bilateral mammogram and ultrasound that was concerning for several left breast masses and a biopsy was indicated.  LJerrickais here for follow-up from these visits.  She was  discharged from the ER on December 27 with a Vicodin prescription.  This contains hydrocodone 5 acetaminophen 325 to take 1 tablet every 6 hours as needed.  She notes that her pain is worse in her right side/lower back.  This radiates downward into her knee and inner thigh.  The pain is constant varying in intensity and worse at night.  She denies any bowel or bladder incontinence or saddle anesthesia.  She will take Vicodin for the pain but notes that it does not improve it.  She notes that her pain has consistently remained above a 6 and as high as a 10.  She notes that the Vicodin does make her sleepy.  She denies any constipation from the Vicodin.  Jeffery also notes that her appetite has been decreased lately.  She said that sometimes this accompanies back pain.  She does not have any difficulty drinking fluids.  She has lost about 11 pounds since her last [place] with [place]  She also notes shortness of breath on exertion when walking from room to room.  LParticiasays that she stopped her antiestrogen therapy with tamoxifen back in September 2022 due to concern over the side effects interesting in her faith to heal her.   Patient Active Problem List   Diagnosis Date Noted   Port-A-Cath in place 03/26/2019   Genetic testing 02/01/2019   Family history of prostate cancer    Family history of throat cancer    Family  history of breast cancer    Malignant neoplasm of upper-outer quadrant of left breast in female, estrogen receptor positive ([place]) 01/18/2019   Other allergic rhinitis 12/03/2014    is allergic to latex, other, and adhesive [tape].  MEDICAL HISTORY: Past Medical History:  Diagnosis Date   Asthma    Cancer ([place])    breast   Family history of breast cancer    Family history of breast cancer    Family history of prostate cancer    Family history of throat cancer    Hypercholesteremia    PONV (postoperative nausea and vomiting)     SURGICAL HISTORY: Past Surgical History:   Procedure Laterality Date   AXILLARY LYMPH NODE DISSECTION Left 07/11/2019   Procedure: LEFT AXILLARY LYMPH NODE DISSECTION;  Surgeon: Donnie Mesa, MD;  Location: [facility];  Service: General;  Laterality: Left;   BREAST LUMPECTOMY WITH RADIOACTIVE SEED AND AXILLARY LYMPH NODE DISSECTION Left 06/13/2019   Procedure: LEFT BREAST LUMPECTOMY WITH RADIOACTIVE SEED AND AXILLARY TARGETED LYMPH NODE DISSECTION;  Surgeon: Donnie Mesa, MD;  Location: [facility];  Service: General;  Laterality: Left;   [place] Right 02/05/2019   Procedure: INSERTION PORT-A-CATH WITH ULTRASOUND;  Surgeon: Donnie Mesa, MD;  Location: [facility];  Service: General;  Laterality: Right;   WISDOM TOOTH EXTRACTION     WOUND EXPLORATION Right 06/13/2019   Procedure: RIGHT CHEST WOUND EXPLORATION;  Surgeon: Donnie Mesa, MD;  Location: [facility];  Service: General;  Laterality: Right;    SOCIAL HISTORY: Social History   Socioeconomic History   Marital status: Single    Spouse name: Not on file   Number of children: Not on file   Years of education: Not on file   Highest education level: Not on file  Occupational History   Not on file  Tobacco Use   Smoking status: Never   Smokeless tobacco: Never  Vaping Use   Vaping Use: Never used  Substance and Sexual Activity   Alcohol use: Yes    Comment: occasionally   Drug use: No   Sexual activity: Yes    Birth control/protection: None  Other Topics Concern   Not on file  Social History Narrative   Not on file   Social Determinants of Health   Financial Resource Strain: Not on file  Food Insecurity: Not on file  Transportation Needs: Not on file  Physical Activity: Not on file  Stress: Not on file  Social Connections: Not on file  Intimate Partner Violence: Not on file    FAMILY HISTORY: Family History  Problem Relation Age of Onset   Hypertension  Mother    Heart failure Mother    Breast cancer Maternal Aunt        dx 41s   Breast cancer Paternal Aunt    Prostate cancer Father 23   Throat cancer Cousin     Review of Systems  Constitutional:  Positive for appetite change, fatigue and unexpected weight change. Negative for chills and fever.  HENT:   Negative for hearing loss, mouth sores and trouble swallowing.   Eyes:  Negative for eye problems and icterus.  Respiratory:  Positive for shortness of breath. Negative for chest tightness and cough.   Cardiovascular:  Negative for chest pain, leg swelling and palpitations.  Gastrointestinal:  Negative for abdominal distention, abdominal pain, constipation, diarrhea, nausea and vomiting.  Endocrine: Negative for hot flashes.  Genitourinary:  Negative for difficulty urinating.   Musculoskeletal:  Positive for back pain. Negative for arthralgias.  Skin:  Negative for itching and rash.  Neurological:  Negative for dizziness, extremity weakness, headaches and numbness.  Hematological:  Negative for adenopathy. Does not bruise/bleed easily.  Psychiatric/Behavioral:  Negative for depression. The patient is not nervous/anxious.      PHYSICAL EXAMINATION  ECOG PERFORMANCE STATUS: 3 - Symptomatic, >50% confined to bed  Vitals:   03/26/21 1303  BP: 120/87  Pulse: (!) 129  Resp: 20  Temp: 97.8 F (36.6 C)  SpO2: 98%    Physical Exam Constitutional:      General: She is not in acute distress.    Appearance: Normal appearance. She is not toxic-appearing.  HENT:     Head: Normocephalic and atraumatic.  Eyes:     General: No scleral icterus. Cardiovascular:     Rate and Rhythm: Normal rate and regular rhythm.     Pulses: Normal pulses.     Heart sounds: Normal heart sounds.  Pulmonary:     Effort: Pulmonary effort is normal.     Breath sounds: Normal breath sounds.  Abdominal:     General: Abdomen is flat. Bowel sounds are normal. There is no distension.     Palpations:  Abdomen is soft.     Tenderness: There is no abdominal tenderness.  Musculoskeletal:        General: No swelling.     Cervical back: Neck supple.  Lymphadenopathy:     Cervical: No cervical adenopathy.  Skin:    General: Skin is warm and dry.     Findings: No rash.  Neurological:     General: No focal deficit present.     Mental Status: She is alert.  Psychiatric:        Mood and Affect: Mood normal.        Behavior: Behavior normal.    LABORATORY DATA:  CBC    Component Value Date/Time   WBC 6.1 03/26/2021 1246   WBC 5.8 03/22/2021 0955   RBC 4.02 03/26/2021 1246   HGB 12.0 03/26/2021 1246   HCT 34.4 (L) 03/26/2021 1246   PLT 266 03/26/2021 1246   MCV 85.6 03/26/2021 1246   MCH 29.9 03/26/2021 1246   MCHC 34.9 03/26/2021 1246   RDW 12.1 03/26/2021 1246   LYMPHSABS 1.2 03/26/2021 1246   MONOABS 0.7 03/26/2021 1246   EOSABS 0.0 03/26/2021 1246   BASOSABS 0.0 03/26/2021 1246    CMP     Component Value Date/Time   NA 129 (L) 03/26/2021 1246   K 3.7 03/26/2021 1246   CL 92 (L) 03/26/2021 1246   CO2 26 03/26/2021 1246   GLUCOSE 88 03/26/2021 1246   GLUCOSE 87 03/31/2006 1359   BUN 5 (L) 03/26/2021 1246   CREATININE 0.61 03/26/2021 1246   CALCIUM 9.2 03/26/2021 1246   PROT 8.0 03/26/2021 1246   ALBUMIN 3.4 (L) 03/26/2021 1246   AST 284 (HH) 03/26/2021 1246   ALT 308 (HH) 03/26/2021 1246   ALKPHOS 249 (H) 03/26/2021 1246   BILITOT 4.3 (HH) 03/26/2021 1246   GFRNONAA >60 03/26/2021 1246   GFRAA >60 05/28/2019 0845   RADIOGRAPHIC STUDIES:  US BREAST LTD UNI LEFT INC AXILLA  Addendum Date: 03/25/2021   ADDENDUM REPORT: 03/25/2021 07:38 ADDENDUM: In the physical exam portion, the statement should read: On physical examination, the LEFT breast is enlarged and there is a very firm palpable thickening within the OUTER central LEFT breast. Electronically Signed  By: Margarette Canada M.D.   On: 03/25/2021 07:38   Result Date: 03/25/2021 CLINICAL DATA:  45 year old  female with history of LEFT breast cancer with LEFT lumpectomy and chemotherapy treatment in 2020. New LEFT breast masses, enlarged LEFT axillary lymph nodes and metastatic disease involving the liver and thoracoabdominal lymph nodes on recent CT scans. EXAM: DIGITAL DIAGNOSTIC BILATERAL MAMMOGRAM WITH TOMOSYNTHESIS AND CAD; ULTRASOUND LEFT BREAST LIMITED TECHNIQUE: Bilateral digital diagnostic mammography and breast tomosynthesis was performed. The images were evaluated with computer-aided detection.; Targeted ultrasound examination of the left breast was performed. COMPARISON:  03/22/2021 and 01/17/2021 CTs. 04/02/2020 and prior mammograms. 05/29/2019 breast MR. ACR Breast Density Category c: The breast tissue is heterogeneously dense, which may obscure small masses. FINDINGS: 2D/3D full field views of both breasts and a magnification view of the lumpectomy site are performed. New increased density within the central and OUTER LEFT breast identified with decreased volume of the LEFT breast. No new or suspicious RIGHT breast findings are noted. On physical exam, the RIGHT breast enlarged in there is a very firm palpable thickening within the OUTER central LEFT breast. Targeted ultrasound of the LEFT breast is performed, showing heterogeneous tissue throughout the majority of the LEFT breast. Discrete findings on the LEFT are as follows: A 4.6 x 2 x 3 cm irregular hypoechoic LEFT breast mass deep at the 3 o'clock position 1 cm from the nipple. A 0.7 x 1 x 1.4 cm hypoechoic LEFT breast area/mass within the posterior 6 o'clock position 2 cm from the nipple. A 0.8 cm ill-defined LEFT breast hypoechoic mass at the 8 o'clock position 4 cm from the nipple. A 2.2 x 1.5 x 2.1 cm heterogeneous superficial mass in the LEFT axilla. At least 1 abnormal LEFT axillary lymph node, with cortical thickening of 1 cm. Other enhancing masses within the LEFT breast identified on recent CT are not well visualized sonographic due to  heterogeneity of the LEFT breast parenchyma. IMPRESSION: 1. Highly suspicious masses within the LEFT breast and LEFT axilla, and at least 1 abnormal LEFT axillary lymph node. Tissue sampling of the largest highly suspicious mass which measures 4.6 cm at the 3 o'clock position 1 cm from the nipple and tissue sampling of the abnormal LEFT axillary lymph node are recommended, as clinically indicated. 2. Other highly suspicious smaller masses within the LEFT breast identified on recent CT are not well visualized mammographically or sonographically given breast tissue density/heterogeneity. 3.  ( RECOMMENDATION: Ultrasound-guided biopsy of 4.6 cm OUTER central LEFT breast mass and ultrasound-guided biopsy of the abnormal LEFT axillary lymph node, which have been scheduled. If tissue sampling of the LEFT breast/LEFT axilla is not warranted clinically (given hepatic and thoracoabdominal lymph node metastases), then these procedures can be canceled. Breast MRI may be helpful given high likelihood of diffuse malignancy within the LEFT breast. I have discussed the findings and recommendations with the patient. If applicable, a reminder letter will be sent to the patient regarding the next appointment. BI-RADS CATEGORY  5: Highly suggestive of malignancy. Electronically Signed: By: Margarette Canada M.D. On: 03/24/2021 15:39   ASSESSMENT and THERAPY PLAN:   Malignant neoplasm of upper-outer quadrant of left breast in female, estrogen receptor positive ([place]) 01/18/2019: Patient palpated a left breast lump for 2 weeks. Mammogram showed a 2.3cm left breast mass at the 12 o'clock position, with 1 enlarged left axillary lymph node measuring 3.5cm. 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  ER positive, PR negative, HER-2 positive (initial  IHC 3+) accidentally FISH was done which was negative. T2N1 stage IIa clinical stage   Treatment plan: 1. Neoadjuvant chemotherapy with TCH [place] 6 cycles  (maintenance anti-HER-2 therapy is not being given because the initial pathology was reread as HER-2 negative.  The final pathology on the lumpectomy was also HER-2 negative) ER 60% week, PR negative 2. 06/13/2019 left lumpectomy ([facility]): no residual carcinoma, 2/3 left axillary lymph nodes positive for carcinoma. 07/15/2019: ALN D: 3/3 lymph nodes positive.  No extranodal extension 3. Followed by adjuvant radiation therapy started 08/20/2019 No adverse effects from participation in the neuropathy clinical trial SWOG 1714 CT Chest 01/25/21: No PE, Lymphadenopathy in mediastinum and hilar regions, Mass vs LN in axillary tain METASTATIC progression: 03/23/2021: Widespread metastatic disease including the liver thoracoabdominal nodes and left chest wall/breast.  Gallbladder wall thickening, small right pleural effusion. 03/25/2021: Diagnostic mammogram showing several left breast masses concerning for malignancy the largest being 4.6 cm.  --------------------------------------------------------------------------------------------------------------------------------------------------------------------------------------------------------------------------------------  1.  Metastatic breast cancer: Dr. Lindi Adie reviewed the imaging obtained in the ER with CT abdomen and pelvis with Cady and her sister.  He showed them the images where her liver metastases are.  He conveyed his concern for the aggression of her cancer and the need to get the biopsy as scheduled in order to identify treatment options.  Since her cancer was estrogen positive in the past he reviewed that if it continues to be estrogen positive he would recommend Zoladex, letrozole, Verzenio.  He reviewed that with [place] in detail.  Tinesha asked about taking a holistic approach.  Dr. Lindi Adie said that if she wanted to approach it that way doing it in addition to treatment would be acceptable however doing it alone would not be effective against the  cancer which is growing quickly and would take her life.  Macklyn will proceed with biopsy of the left breast mass/lymph node with the breast center on January 5 as scheduled.  2.  Cancer related pain: Her pain is uncontrolled with Vicodin that was ordered at her ER visit.  She had significant pain during her visit so we gave her 5 mg of oxycodone.  She tolerated this well and still persisted in pain throughout the rest of the visit.  Dr. Lindi Adie wrote for her to have oxycodone 10 mg 4 times a day as needed for her pain.  We instructed her to take stool softener or laxative if needed for constipation.  She will keep track of her pain levels and will let us know if she still continues to struggle with her pain control.  3.  Decreased appetite: This is likely secondary to the location of her liver metastasis along with the cancer.  Dr. Lindi Adie prescribed dexamethasone 2 mg oral daily to help increase her appetite.  Inetta has a follow-up virtual visit with Dr. Lindi Adie on April 07, 2020.  At that time they will discuss her pathology results.    All questions were answered. The patient knows to call the clinic with any problems, questions or concerns. We can certainly see the patient much sooner if necessary.  Total encounter time: 60 minutes in face-to-face visit time, chart review, lab review, care coordination, order entry, and documentation of the encounter.  Wilber Bihari, NP 03/26/21 4:03 PM Medical Oncology and Hematology [facility] [place], [place] [address] Tel. [phone]    Fax. [phone]  *Total Encounter Time as defined by the Centers for Medicare and Medicaid Services includes, in addition to the  face-to-face time of a patient visit (documented in the note above) non-face-to-face time: obtaining and reviewing outside history, ordering and reviewing medications, tests or procedures, care coordination (communications with other health care professionals or  caregivers) and documentation in the medical record.  Attending Note Metastatic breast cancer: She has extensive liver metastases.  I discussed with her that without immediate or urgent treatment she could have rapid progression of disease that could take her life.  She is more interested in holistic approach.  My recommendation would be to obtain a liver biopsy so that we can finalize her treatment plan.  She has an appointment in January for further biopsy. Cancer related pain: Currently on oxycodone 10 mg. Decreased appetite: I prescribed dexamethasone to improve her appetite. Return to clinic January 11 to discuss pathology results and see what she would like to do from treatment standpoint. I personally saw and examined TANICE PETRE. The plan of care was discussed with her. I agree with the physical exam findings and assessment and plan as documented above. I performed the majority of the counseling and assessment and plan regarding this encounter  Signed Harriette Ohara, MD

## 2021-03-26 NOTE — Addendum Note (Signed)
Addended by: Laureen Abrahams on: 03/26/2021 01:57 PM   Modules accepted: Orders

## 2021-03-26 NOTE — Addendum Note (Signed)
Addended by: Deatra Robinson B on: 03/26/2021 01:58 PM   Modules accepted: Orders

## 2021-03-26 NOTE — Addendum Note (Signed)
Addended by: Deatra Robinson B on: 03/26/2021 02:00 PM   Modules accepted: Orders

## 2021-03-26 NOTE — Assessment & Plan Note (Addendum)
01/18/2019:Patient palpated a left breast lump for 2 weeks. Mammogram showed a 2.3cm left breast mass at the 12 o'clock position, with 1 enlarged left axillary lymph node measuring 3.5cm. 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 T2N1 stage IIa clinical stage  Treatment plan: 1. Neoadjuvant chemotherapy with TCH Perjeta 6 cycles (maintenance anti-HER-2 therapy is not being given because the initial pathology was reread as HER-2 negative.  The final pathology on the lumpectomy was also HER-2 negative) ER 60% week, PR negative 2. 06/13/2019 left lumpectomy ([facility]): no residual carcinoma, 2/3 left axillary lymph nodes positive for carcinoma. 07/15/2019: ALN D: 3/3 lymph nodes positive.  No extranodal extension 3. Followed by adjuvant radiation therapy started 08/20/2019 No adverse effects from participation in the neuropathy clinical trial SWOG 1714 CT Chest 01/25/21: No PE, Lymphadenopathy in mediastinum and hilar regions, Mass vs LN in axillary tain METASTATIC progression: 03/23/2021: Widespread metastatic disease including the liver thoracoabdominal nodes and left chest wall/breast.  Gallbladder wall thickening, small right pleural effusion. 03/25/2021: Diagnostic mammogram showing several left breast masses concerning for malignancy the largest being 4.6 cm.  --------------------------------------------------------------------------------------------------------------------------------------------------------------------------------------------------------------------------------------  1.  Metastatic breast cancer: Dr. Lindi Adie reviewed the imaging obtained in the ER with CT abdomen and pelvis with Estela and her sister.  He showed them the images where her liver metastases are.  He conveyed his concern for the aggression of her cancer and the need to get the biopsy  as scheduled in order to identify treatment options.  Since her cancer was estrogen positive in the past he reviewed that if it continues to be estrogen positive he would recommend Zoladex, letrozole, Verzenio.  He reviewed that with Ryland in detail.  Eleesha asked about taking a holistic approach.  Dr. Lindi Adie said that if she wanted to approach it that way doing it in addition to treatment would be acceptable however doing it alone would not be effective against the cancer which is growing quickly and would take her life.  Delores will proceed with biopsy of the left breast mass/lymph node with the breast center on January 5 as scheduled.  2.  Cancer related pain: Her pain is uncontrolled with Vicodin that was ordered at her ER visit.  She had significant pain during her visit so we gave her 5 mg of oxycodone.  She tolerated this well and still persisted in pain throughout the rest of the visit.  Dr. Lindi Adie wrote for her to have oxycodone 10 mg 4 times a day as needed for her pain.  We instructed her to take stool softener or laxative if needed for constipation.  She will keep track of her pain levels and will let us know if she still continues to struggle with her pain control.  3.  Decreased appetite: This is likely secondary to the location of her liver metastasis along with the cancer.  Dr. Lindi Adie prescribed dexamethasone 2 mg oral daily to help increase her appetite.  Darra has a follow-up virtual visit with Dr. Lindi Adie on April 07, 2020.  At that time they will discuss her pathology results.

## 2021-03-27 LAB — CANCER ANTIGEN 27.29: CA 27.29: 112.5 U/mL — ABNORMAL HIGH (ref 0.0–38.6)

## 2021-03-29 ENCOUNTER — Encounter: Payer: Self-pay | Admitting: Adult Health

## 2021-03-30 ENCOUNTER — Encounter: Payer: Self-pay | Admitting: Hematology and Oncology

## 2021-03-30 ENCOUNTER — Other Ambulatory Visit: Payer: Self-pay

## 2021-03-30 ENCOUNTER — Telehealth: Payer: Self-pay

## 2021-03-30 ENCOUNTER — Other Ambulatory Visit: Payer: Self-pay | Admitting: Hematology and Oncology

## 2021-03-30 DIAGNOSIS — Z170 Estrogen receptor positive status [ER+]: Secondary | ICD-10-CM

## 2021-03-30 DIAGNOSIS — C50412 Malignant neoplasm of upper-outer quadrant of left female breast: Secondary | ICD-10-CM

## 2021-03-30 MED ORDER — FENTANYL 25 MCG/HR TD PT72
1.0000 | MEDICATED_PATCH | TRANSDERMAL | 0 refills | Status: DC
Start: 2021-03-30 — End: 2021-05-02

## 2021-03-30 NOTE — Telephone Encounter (Signed)
Called pt, MD sending in Rx for pain control. Pt verbalized thanks

## 2021-03-30 NOTE — Progress Notes (Signed)
Insufficient pain relief with 10 mg of oxycodone. I sent a prescription for fentanyl patch 25 mcg every 72 hours. We will also consult palliative care.

## 2021-03-31 ENCOUNTER — Encounter: Payer: Self-pay | Admitting: Hematology and Oncology

## 2021-04-01 ENCOUNTER — Other Ambulatory Visit: Payer: Self-pay | Admitting: Hematology and Oncology

## 2021-04-01 ENCOUNTER — Ambulatory Visit
Admission: RE | Admit: 2021-04-01 | Discharge: 2021-04-01 | Disposition: A | Discharge status: Home / Self Care | Payer: BC Managed Care – PPO | Source: Ambulatory Visit | Attending: Adult Health | Admitting: Adult Health

## 2021-04-01 ENCOUNTER — Encounter: Payer: Self-pay | Admitting: Hematology and Oncology

## 2021-04-01 DIAGNOSIS — C50412 Malignant neoplasm of upper-outer quadrant of left female breast: Secondary | ICD-10-CM

## 2021-04-01 DIAGNOSIS — R2232 Localized swelling, mass and lump, left upper limb: Secondary | ICD-10-CM

## 2021-04-01 DIAGNOSIS — N6320 Unspecified lump in the left breast, unspecified quadrant: Secondary | ICD-10-CM

## 2021-04-01 DIAGNOSIS — Z170 Estrogen receptor positive status [ER+]: Secondary | ICD-10-CM

## 2021-04-01 IMAGING — MG MM BREAST LOCALIZATION CLIP
4 series · 4 of 12 positions shown · non-contrast
Comparison: Previous exam(s).

CLINICAL DATA: Confirmation clip placement after ultrasound-guided
core needle biopsy of a mass involving the inner LEFT breast at the
3 o'clock location and ultrasound-guided core needle biopsy of a
pathologic LEFT axillary lymph node.

EXAM:
2D and 3D DIAGNOSTIC LEFT MAMMOGRAM POST ULTRASOUND BIOPSY

[L CC synth-2D]
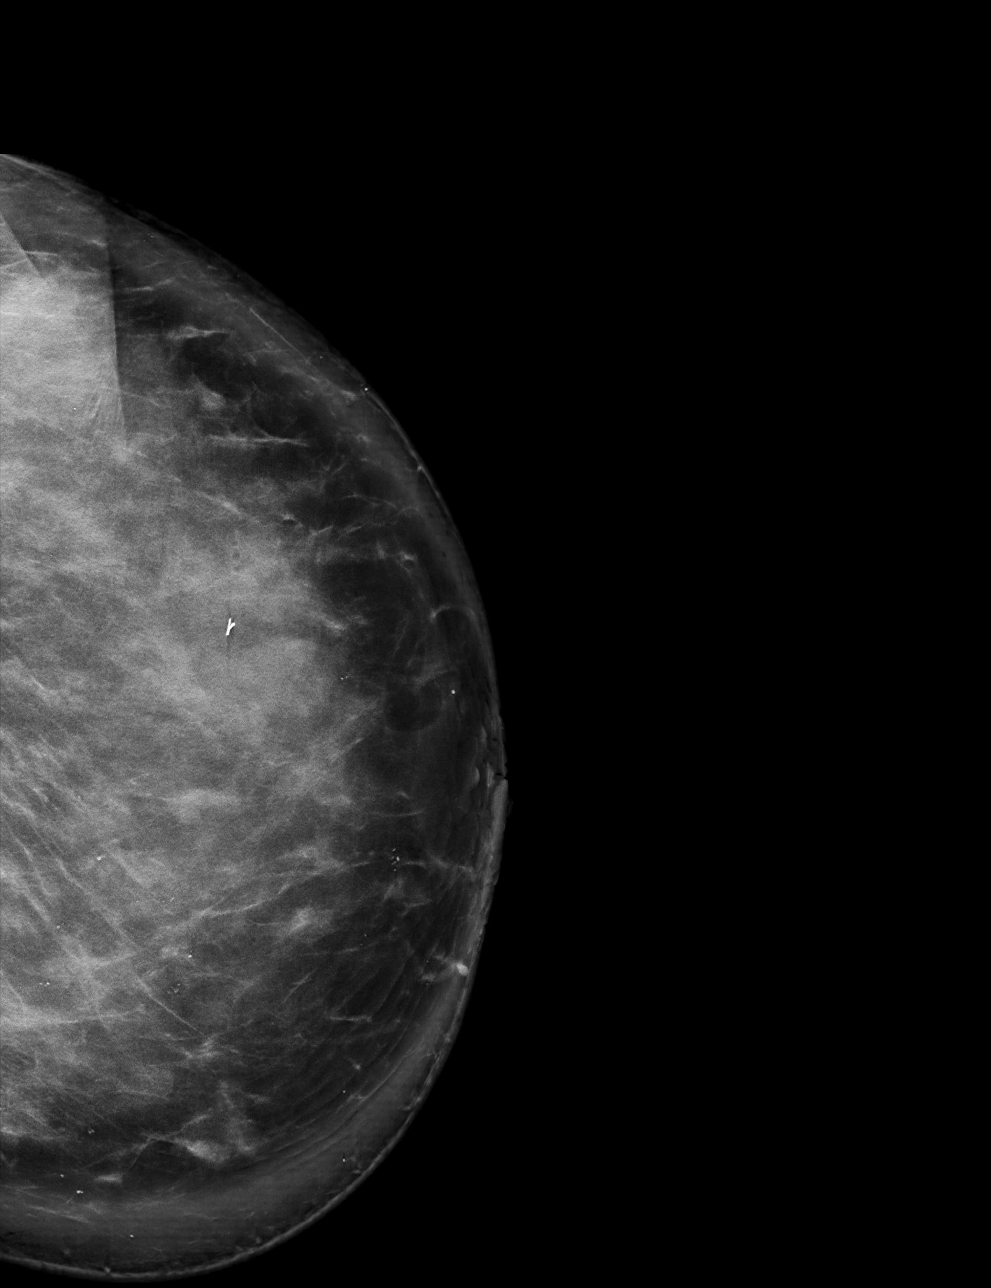

[L MLO synth-2D]
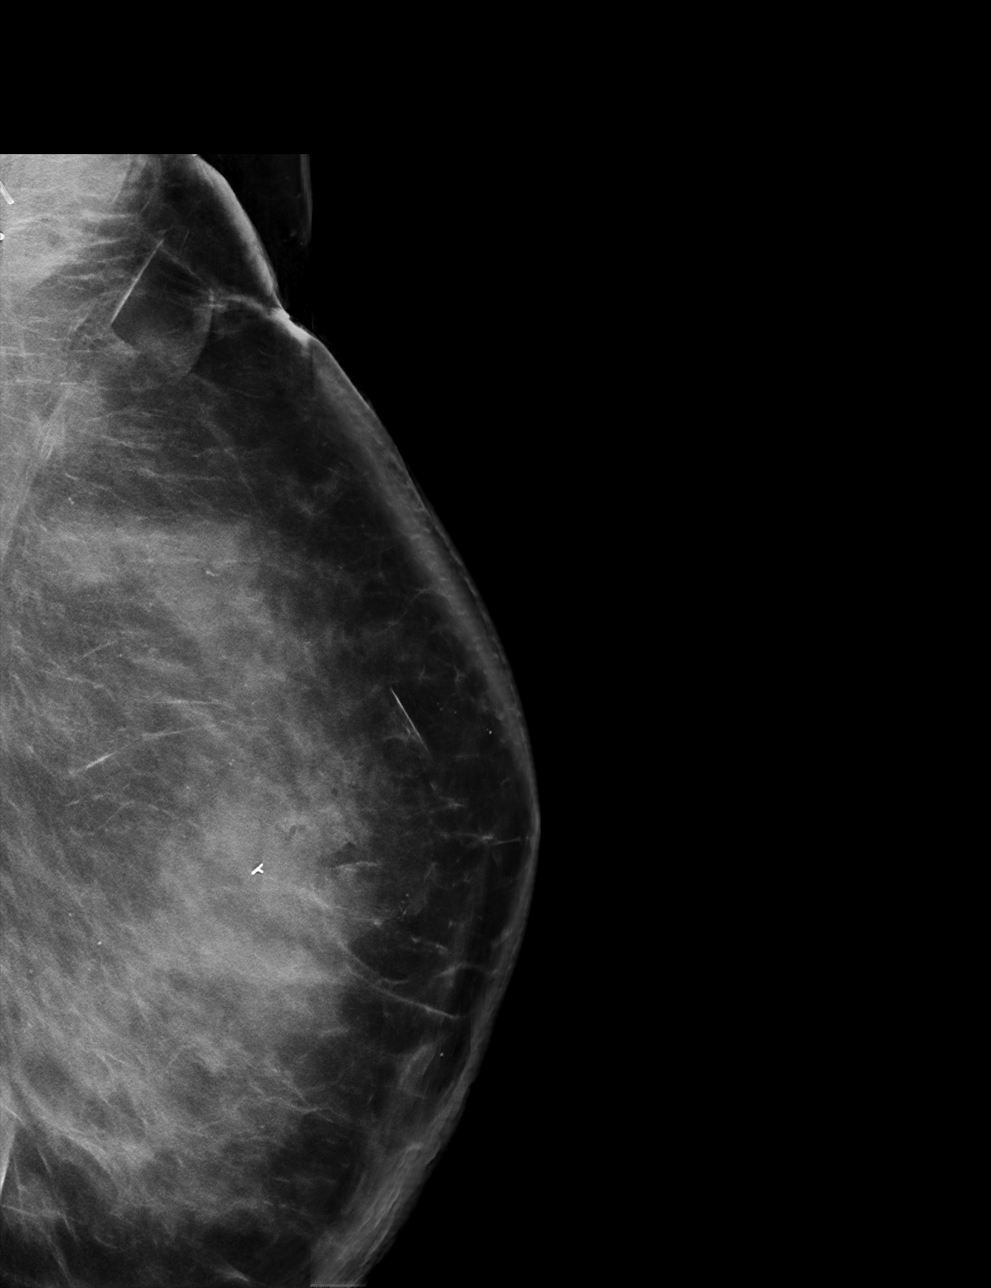

[L CC tomo · tomo slice 60/119.0]
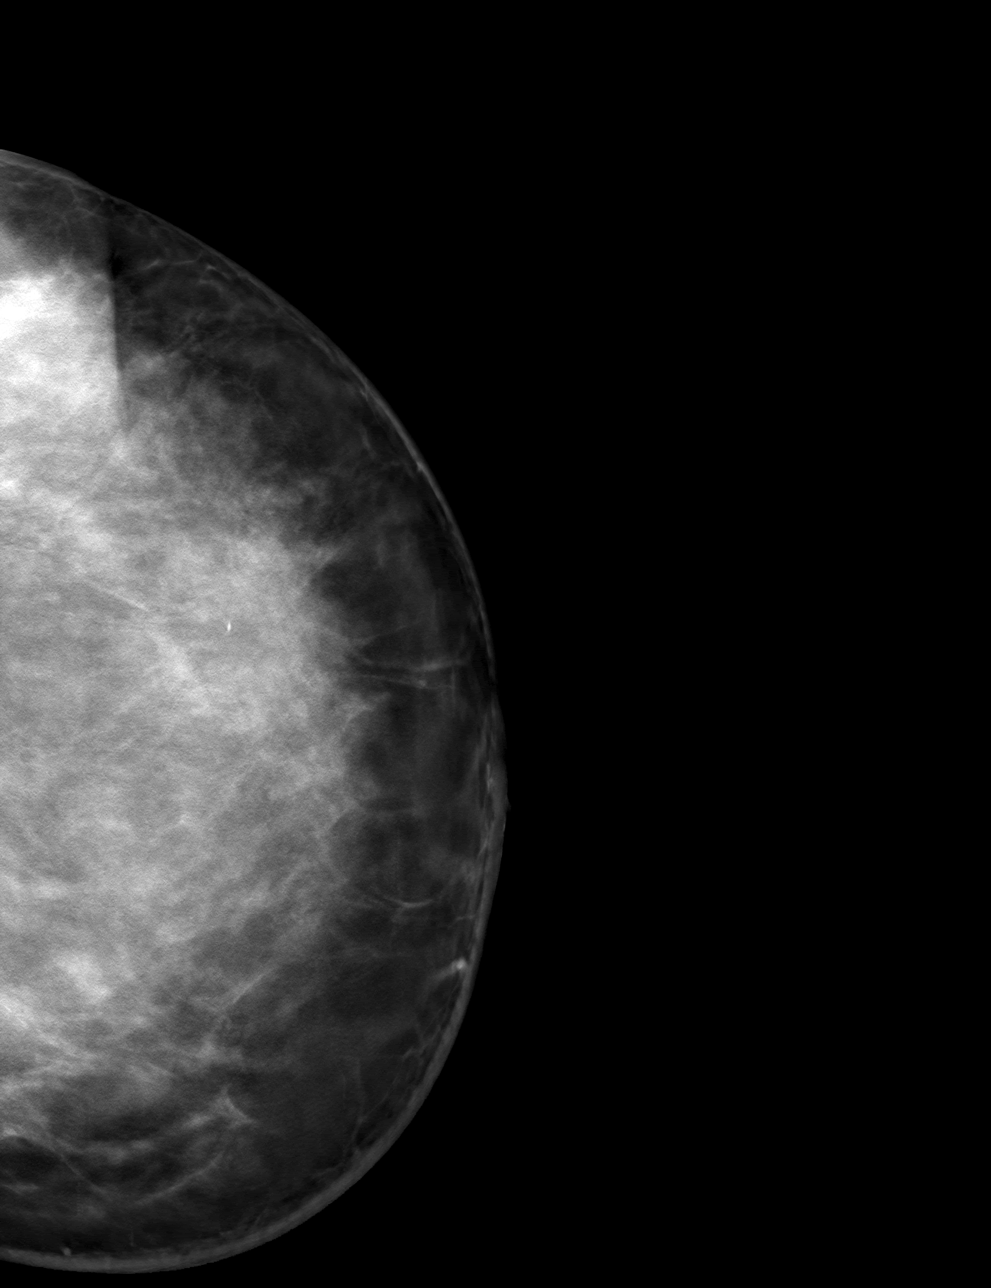

[L MLO tomo · tomo slice 65/129.0]
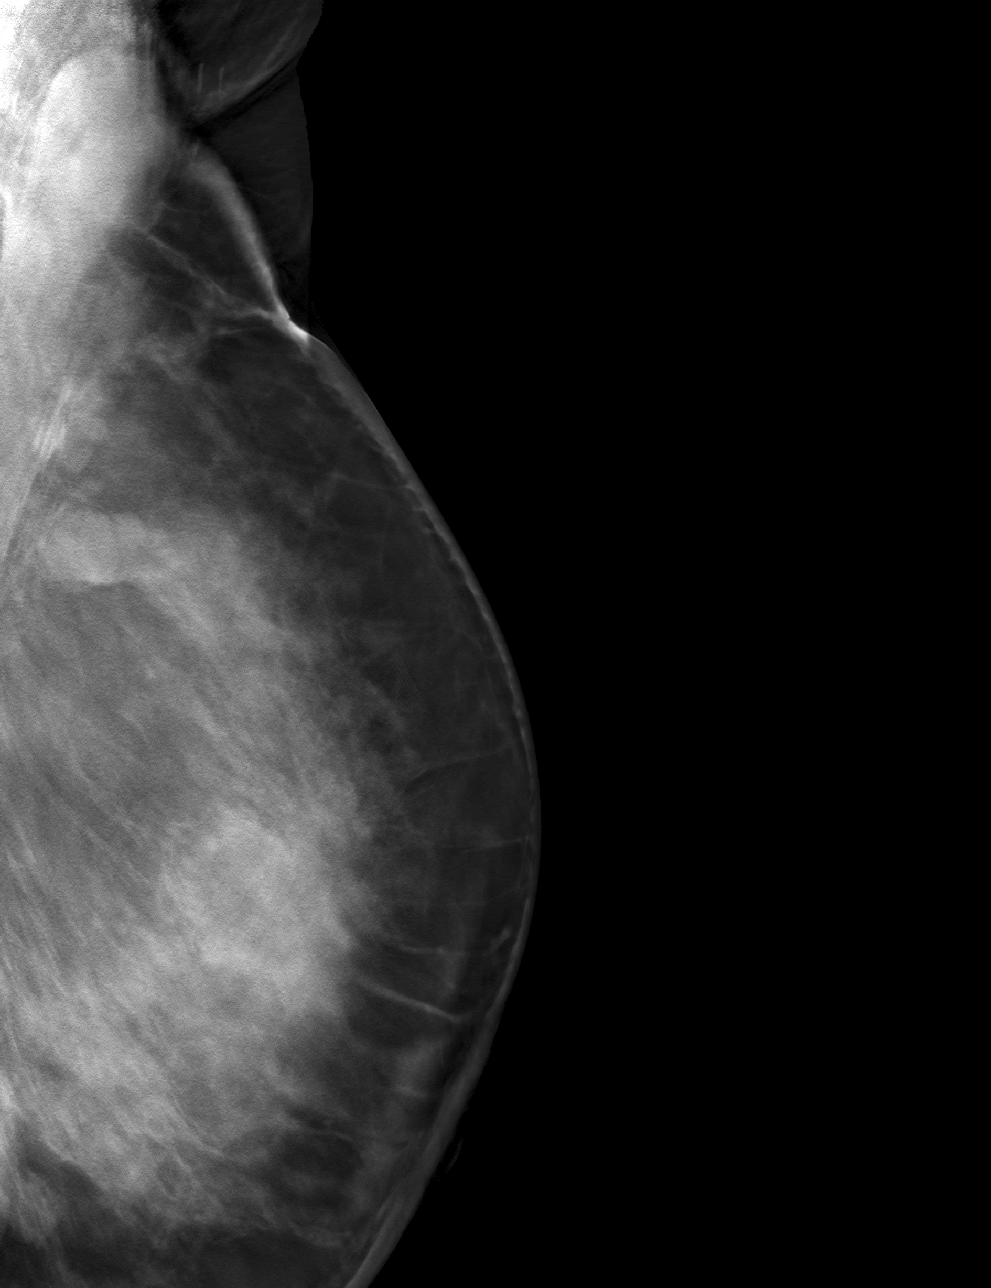

[4 of 12 positions shown; findings below may reference images not displayed]

FINDINGS: 2D and 3D full field CC and mediolateral images were obtained
following ultrasound guided core needle biopsy of a mass in the
inner LEFT breast and biopsy of a LEFT axillary lymph node.

The ribbon shaped tissue biopsy marking clip is appropriately
positioned within the biopsied mass in the inner LEFT breast at the
3 o'clock location.

The Tribell tissue marker clip is appropriately position within the
biopsied lymph node in the LEFT axilla.

Expected post biopsy changes are present at both sites without
evidence of hematoma.
IMPRESSION: 1. Appropriate positioning of the ribbon shaped biopsy marking clip
within the biopsied mass in the outer LEFT breast at the 3 o'clock
location.
2. Appropriate positioning of the Tribell biopsy marking clip within
the biopsied lymph node in the LEFT axilla.

Final Assessment: Post Procedure Mammograms for Marker Placement

## 2021-04-02 ENCOUNTER — Other Ambulatory Visit: Payer: Self-pay | Admitting: Adult Health

## 2021-04-02 DIAGNOSIS — C50412 Malignant neoplasm of upper-outer quadrant of left female breast: Secondary | ICD-10-CM

## 2021-04-02 DIAGNOSIS — Z170 Estrogen receptor positive status [ER+]: Secondary | ICD-10-CM

## 2021-04-02 NOTE — Progress Notes (Signed)
Patient had requested liver biopsy to confirm liver metastasis from her breast cancer.  I have placed that order accordingly.  I included a request in the order for the pathology to send for the prognostic panel and also to send for Caris testing.  Wilber Bihari, NP 04/02/21 3:45 PM Medical Oncology and Hematology [facility] [place], [place] [address] Tel. [phone]    Fax. [phone]

## 2021-04-05 ENCOUNTER — Inpatient Hospital Stay: Payer: BC Managed Care – PPO | Attending: Hematology and Oncology | Admitting: Nurse Practitioner

## 2021-04-05 ENCOUNTER — Encounter (HOSPITAL_COMMUNITY): Payer: Self-pay

## 2021-04-05 ENCOUNTER — Telehealth: Payer: Self-pay

## 2021-04-05 ENCOUNTER — Ambulatory Visit (HOSPITAL_COMMUNITY)
Admission: RE | Admit: 2021-04-05 | Discharge: 2021-04-05 | Disposition: A | Discharge status: Home / Self Care | Payer: BC Managed Care – PPO | Source: Ambulatory Visit | Attending: Hematology and Oncology | Admitting: Hematology and Oncology

## 2021-04-05 ENCOUNTER — Other Ambulatory Visit: Payer: Self-pay

## 2021-04-05 VITALS — BP 101/76 | HR 133 | Temp 97.6°F | Resp 19 | Ht 63.0 in | Wt 166.8 lb

## 2021-04-05 DIAGNOSIS — R530 Neoplastic (malignant) related fatigue: Secondary | ICD-10-CM

## 2021-04-05 DIAGNOSIS — C50412 Malignant neoplasm of upper-outer quadrant of left female breast: Secondary | ICD-10-CM | POA: Diagnosis present

## 2021-04-05 DIAGNOSIS — K5903 Drug induced constipation: Secondary | ICD-10-CM

## 2021-04-05 DIAGNOSIS — Z170 Estrogen receptor positive status [ER+]: Secondary | ICD-10-CM | POA: Diagnosis present

## 2021-04-05 DIAGNOSIS — Z5111 Encounter for antineoplastic chemotherapy: Secondary | ICD-10-CM | POA: Diagnosis present

## 2021-04-05 DIAGNOSIS — Z79899 Other long term (current) drug therapy: Secondary | ICD-10-CM | POA: Insufficient documentation

## 2021-04-05 DIAGNOSIS — Z5189 Encounter for other specified aftercare: Secondary | ICD-10-CM | POA: Diagnosis not present

## 2021-04-05 DIAGNOSIS — R634 Abnormal weight loss: Secondary | ICD-10-CM | POA: Diagnosis not present

## 2021-04-05 DIAGNOSIS — R531 Weakness: Secondary | ICD-10-CM

## 2021-04-05 DIAGNOSIS — K5900 Constipation, unspecified: Secondary | ICD-10-CM | POA: Insufficient documentation

## 2021-04-05 DIAGNOSIS — Z515 Encounter for palliative care: Secondary | ICD-10-CM

## 2021-04-05 DIAGNOSIS — G893 Neoplasm related pain (acute) (chronic): Secondary | ICD-10-CM | POA: Insufficient documentation

## 2021-04-05 DIAGNOSIS — Z7189 Other specified counseling: Secondary | ICD-10-CM

## 2021-04-05 IMAGING — CT CT CHEST W/ CM
2 of 4 series · 14 of 36 positions shown, 17 images · IV contrast (OMNIPAQUE)
Comparison: [DATE] chest CT angiogram. [DATE] CT
abdomen/pelvis.

CLINICAL DATA: History of left breast cancer, completed therapy in
[77], now with biopsy-proven metastatic recurrence in the outer left
breast and left axilla. Restaging. Dyspnea on exertion.

EXAM:
CT CHEST WITH CONTRAST
TECHNIQUE: Multidetector CT imaging of the chest was performed during
intravenous contrast administration.
CONTRAST:  60mL OMNIPAQUE IOHEXOL 350 MG/ML SOLN

[Series 2: axial st · axial · 0.61mm/px · z∈[-240,-8]mm · 11 of 138 slices shown, 14 images]
[im 11/138  mediastinal]
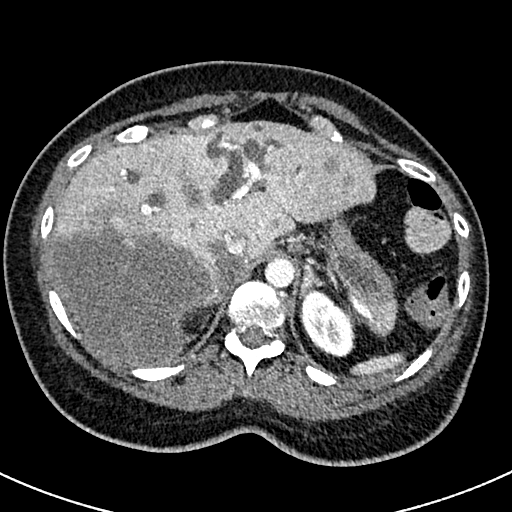
[im 11/138  lung]
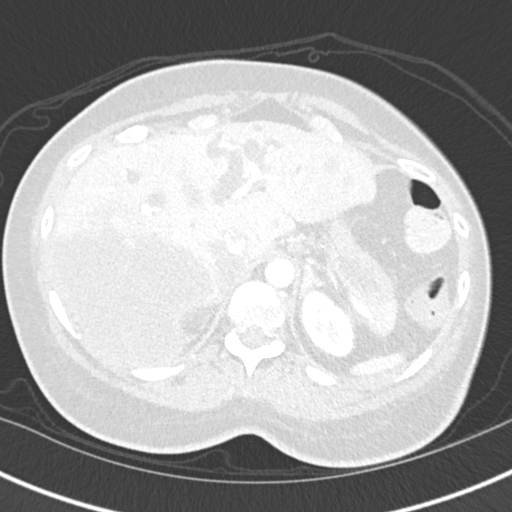
[im 22/138  lung]
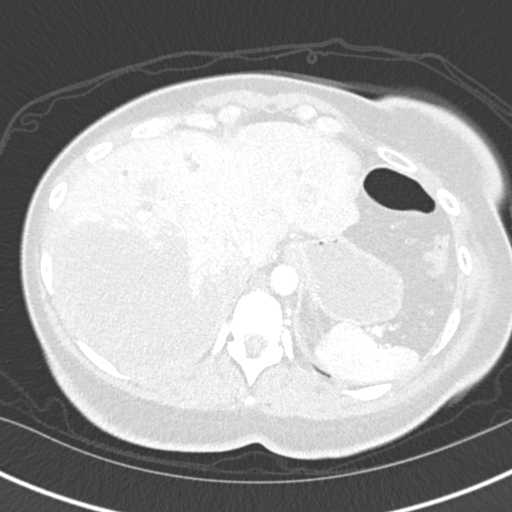
[im 32/138  lung]
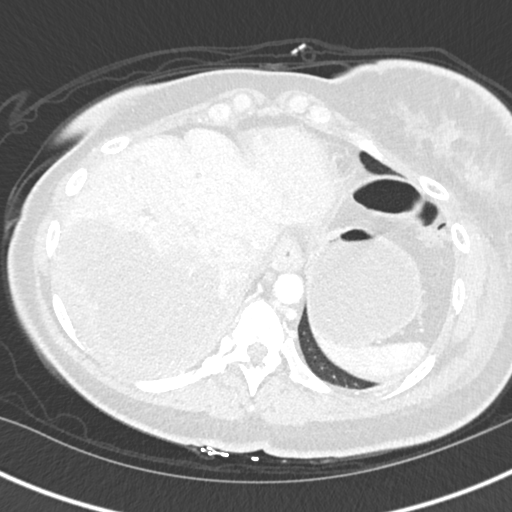
[im 43/138  lung]
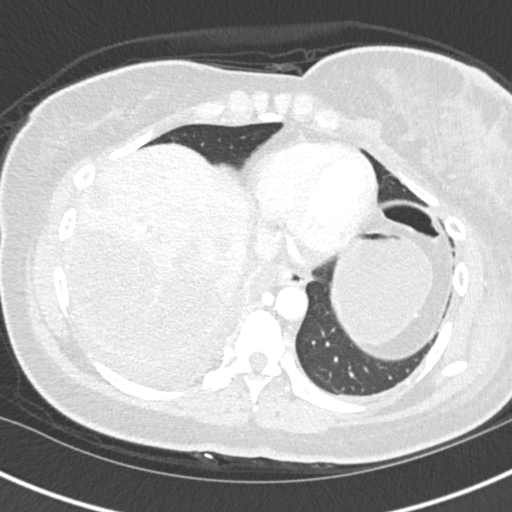
[im 53/138  mediastinal]
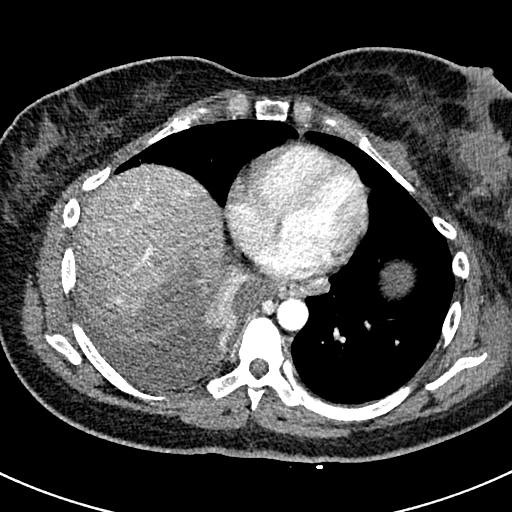
[im 53/138  lung]
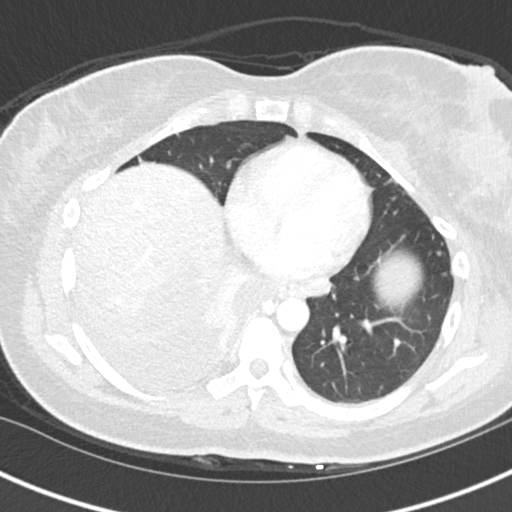
[im 74/138  lung]
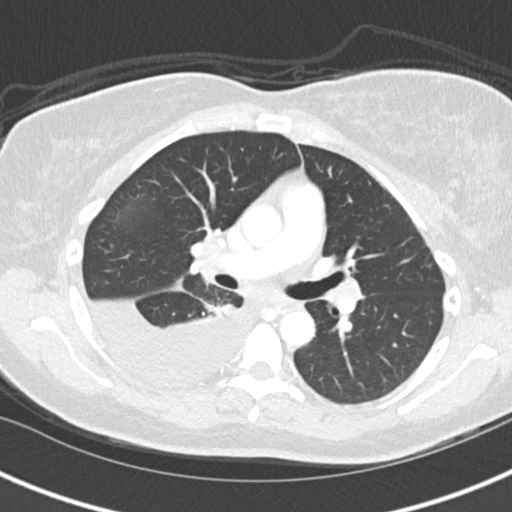
[im 85/138  lung]
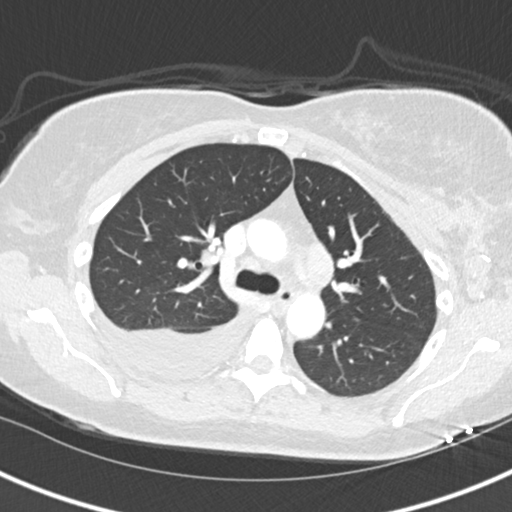
[im 95/138  lung]
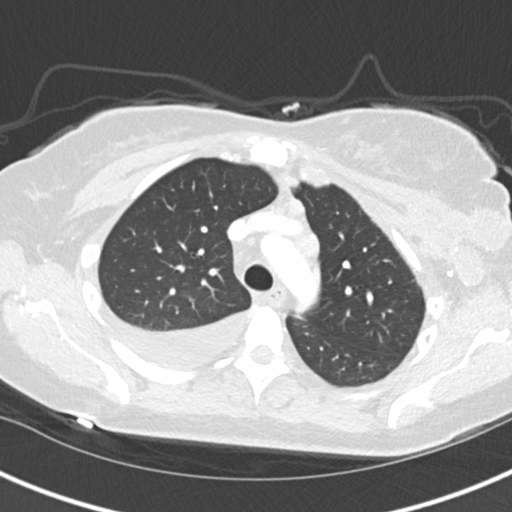
[im 106/138  mediastinal]
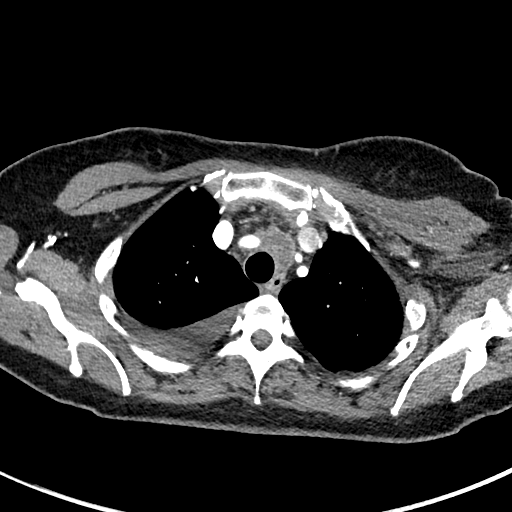
[im 106/138  lung]
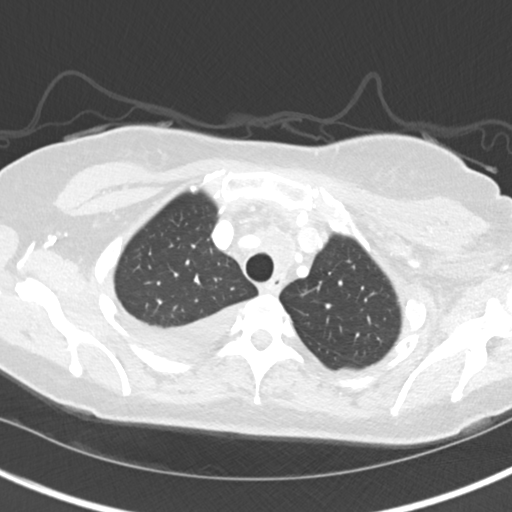
[im 116/138  lung]
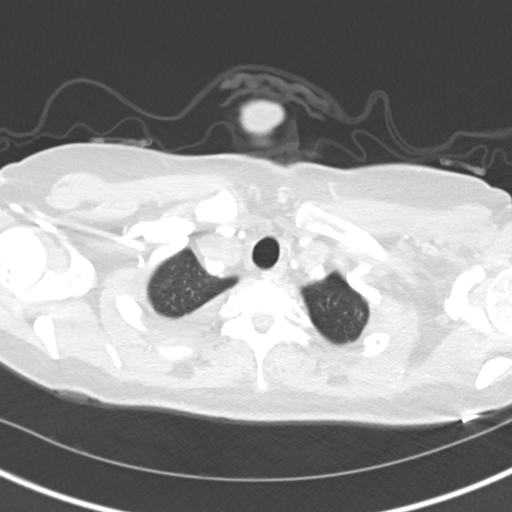
[im 127/138  lung]
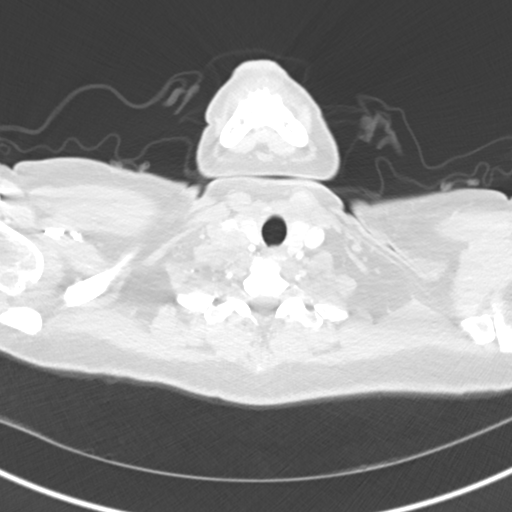

[Series 6: coronal · coronal · 0.55mm/px · 3 of 134 slices shown]
[im 27/134  lung]
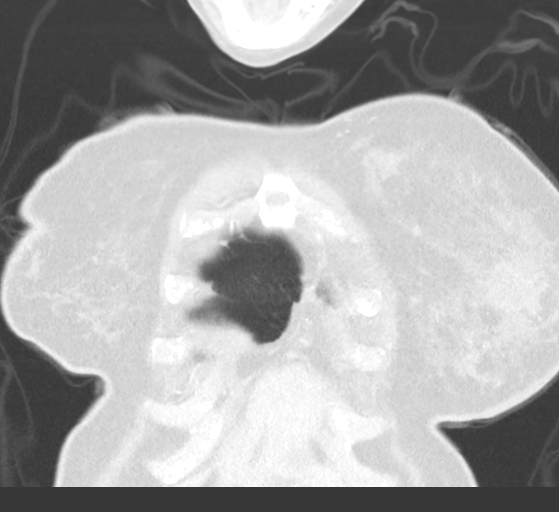
[im 54/134  lung]
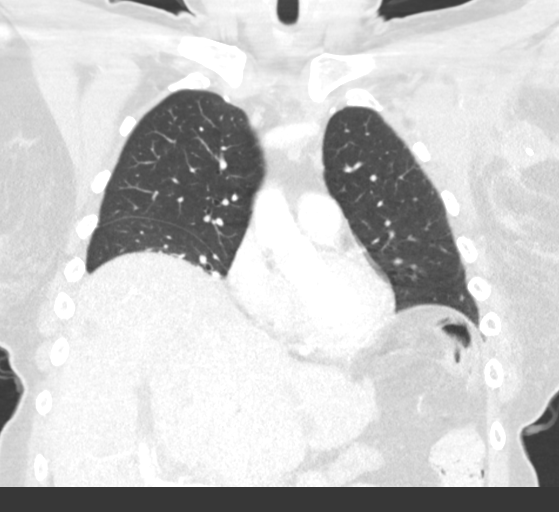
[im 80/134  lung]
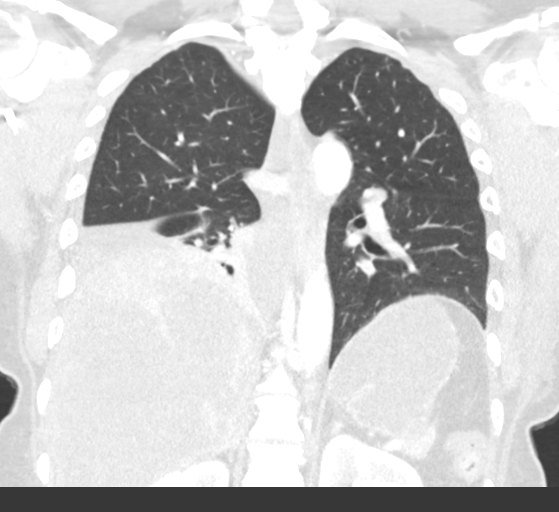

[14 of 36 positions shown; findings below may reference images not displayed]

FINDINGS: Cardiovascular: Normal heart size. No significant pericardial
effusion/thickening. Great vessels are normal in course and caliber.
No central pulmonary emboli.

Mediastinum/Nodes: No discrete thyroid nodules. Unremarkable
esophagus. No right axillary adenopathy. Bilateral supraclavicular
lymphadenopathy is new since [DATE] chest CT, measuring 2.3 cm
short axis diameter on the right (series 2/image 19) and 1.6 cm on
the left (series 2/image 21). Multiple poorly marginated left
axillary lymph nodes, largest 2.3 cm short axis diameter with
internal biopsy clip (series 2/image 47), increased from 1.8 cm on
[DATE] using similar measurement technique. Infiltrative high
left paratracheal 1.9 cm node (series 2/image 31), increased from
1.5 cm. Multiple enlarged prevascular nodes, largest 1.5 cm (series
2/image 40), increased from 1.3 cm. Newly enlarged 1.9 cm right
hilar node (series 2/image 63). Enlarged 1.4 cm left hilar node
(series 2/image 68), minimally increased from 1.3 cm. Newly enlarged
bilateral retrocrural nodes, largest 1.1 cm on the right (series
2/image 112). Enlarged 1.3 cm left internal mammary node (series
2/image 41), increased from 1.0 cm.

Lungs/Pleura: No pneumothorax. Small dependent right pleural
effusion, new. No left pleural effusion. Near complete right lower
lobe atelectasis and partial right middle lobe atelectasis is new.
New tiny 0.3 cm solid anterior right middle lobe nodule (series
5/image 89). No lung masses or additional significant pulmonary
nodules.

Upper abdomen: Small hiatal hernia. Infiltrative 11.7 x 9.1 cm
posterior right liver mass (series 2/image 120), not substantially
changed since [DATE] CT abdomen study. Numerous additional
hypodense liver masses scattered throughout the liver, increased
since [DATE] CT, for example measuring 1.7 cm in the peripheral
right liver (series 2/image 97), increased from 0.9 cm.

Musculoskeletal: No aggressive appearing focal osseous lesions.
Multifocal left breast masses, only partially visualized on this CT
study, several of which appear to involve the left pectoralis
musculature, for example measuring 1.4 cm in the posterior left
breast (series 2/image 88), increased from 0.7 cm on [DATE] chest CT,
and 1.5 cm in the medial posterior left breast (series 2/image 97),
increased from 0.9 cm.
IMPRESSION: 1. Significant interval progression of metastatic disease in the
bilateral supraclavicular, left axillary, mediastinal, bilateral
hilar, bilateral retrocrural and left internal mammary chains.
2. Multifocal left breast masses, only partially visualized on this
CT study, several of which appear to involve the left pectoralis
musculature, increased.
3. New small dependent right pleural effusion. Near complete right
lower lobe and partial right middle lobe atelectasis.
4. New tiny 0.3 cm right middle lobe pulmonary nodule,
indeterminate, cannot exclude pulmonary metastasis.
5. Progressive multifocal liver metastases.
6. Small hiatal hernia.

## 2021-04-05 MED ORDER — SODIUM CHLORIDE (PF) 0.9 % IJ SOLN
INTRAMUSCULAR | Status: AC
  Filled 2021-04-05: qty 50

## 2021-04-05 MED ORDER — IOHEXOL 350 MG/ML SOLN
60.0000 mL | Freq: Once | INTRAVENOUS | Status: AC | PRN
  Administered 2021-04-05: 60 mL via INTRAVENOUS

## 2021-04-05 NOTE — Telephone Encounter (Signed)
Called to follow up with pt in regards to pain medications.  Pt states pain is well controlled on current dosing.  She is applying patch as directed and only needing to use her oxycodone 2-3 times daily.  I instructed pt to call us if this changes, pt verbalized understanding and thanks

## 2021-04-05 NOTE — Progress Notes (Addendum)
[place]  [phone] [phone]   Name: Paige Perry Date: 04/05/2021 MRN: [id number]  DOB: 1975/12/17  Patient Care Team: [name], NP as PCP - General Mauro Kaufmann, RN as Oncology Nurse Navigator Rockwell Germany, RN as Oncology Nurse Navigator Donnie Mesa, MD as Consulting Physician (General Surgery) Nicholas Lose, MD as Consulting Physician (Hematology and Oncology) Kyung Rudd, MD as Consulting Physician (Radiation Oncology)    REASON FOR CONSULTATION: Paige Perry is a 46 y.o. female with medical history including metastatic breast cancer (ER positive) now with widespread disease.  Palliative ask to see for symptom management and goals of care.    SOCIAL HISTORY:     reports that she has never smoked. She has never used smokeless tobacco. She reports current alcohol use. She reports that she does not use drugs.  ADVANCE DIRECTIVES:  None on file.  Patient provided with advanced directive packet.  Detailed education provided.  She would like to complete documents in the future.  She was given advanced directive clinic dates with plans on scheduling for February 13.  CODE STATUS: Full code  PAST MEDICAL HISTORY: Past Medical History:  Diagnosis Date   Asthma    Cancer ([place])    breast   Family history of breast cancer    Family history of breast cancer    Family history of prostate cancer    Family history of throat cancer    Hypercholesteremia    PONV (postoperative nausea and vomiting)     PAST SURGICAL HISTORY:  Past Surgical History:  Procedure Laterality Date   AXILLARY LYMPH NODE DISSECTION Left 07/11/2019   Procedure: LEFT AXILLARY LYMPH NODE DISSECTION;  Surgeon: Donnie Mesa, MD;  Location: [facility];  Service: General;  Laterality: Left;   BREAST LUMPECTOMY WITH RADIOACTIVE SEED AND AXILLARY LYMPH NODE DISSECTION Left 06/13/2019   Procedure: LEFT BREAST  LUMPECTOMY WITH RADIOACTIVE SEED AND AXILLARY TARGETED LYMPH NODE DISSECTION;  Surgeon: Donnie Mesa, MD;  Location: [facility];  Service: General;  Laterality: Left;   FINGER SURGERY     PORTACATH PLACEMENT Right 02/05/2019   Procedure: INSERTION PORT-A-CATH WITH ULTRASOUND;  Surgeon: Donnie Mesa, MD;  Location: [facility];  Service: General;  Laterality: Right;   WISDOM TOOTH EXTRACTION     WOUND EXPLORATION Right 06/13/2019   Procedure: RIGHT CHEST WOUND EXPLORATION;  Surgeon: Donnie Mesa, MD;  Location: [facility];  Service: General;  Laterality: Right;    HEMATOLOGY/ONCOLOGY HISTORY:  Oncology History  Malignant neoplasm of upper-outer quadrant of left breast in female, estrogen receptor positive ([place])  01/18/2019 Initial Diagnosis   Patient palpated a left breast lump for 2 weeks. Mammogram showed a 2.3cm left breast mass at the 12 o'clock position, with 1 enlarged left axillary lymph node measuring 3.5cm. 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   01/23/2019 Cancer Staging   Staging form: Breast, AJCC 8th Edition - Clinical stage from 01/23/2019: Stage IIA (cT2, cN1(f), cM0, G2, ER+, PR-, HER2+)   02/01/2019 Genetic Testing   Negative genetic testing. No pathogenic variants identified on the [facility] Common Hereditary Cancers Panel. The Common Hereditary Cancers Panel offered by [facility] includes sequencing and/or deletion duplication testing of the following 48 genes: APC, ATM, AXIN2, BARD1, BMPR1A, BRCA1, BRCA2, BRIP1, CDH1, CDKN2A (p14ARF), CDKN2A (p16INK4a), CKD4, CHEK2, CTNNA1, DICER1, EPCAM (Deletion/duplication testing only), GREM1 (promoter region deletion/duplication testing only), KIT, MEN1, MLH1,  MSH2, MSH3, MSH6, MUTYH, NBN, NF1, NHTL1, PALB2, PDGFRA, PMS2, POLD1, POLE, PTEN, RAD50, RAD51C, RAD51D, RNF43, SDHB, SDHC, SDHD, SMAD4, SMARCA4. STK11, TP53, TSC1, TSC2, and VHL.  The following genes were  evaluated for sequence changes only: SDHA and HOXB13 c.251G>A variant only. The report date is 02/01/2019.    02/13/2019 - 05/28/2019 Chemotherapy   dexamethasone (DECADRON) 4 MG tablet, 4 mg (100 % of original dose 4 mg), Oral, Daily, 1 of 1 cycle, Start date: 01/23/2019, End date: 05/28/2019. Dose modification: 4 mg (original dose 4 mg, Cycle 0)  palonosetron (ALOXI) injection 0.25 mg, 0.25 mg, Intravenous,  Once, 6 of 6 cycles. Administration: 0.25 mg (02/13/2019), 0.25 mg (03/04/2019), 0.25 mg (05/07/2019), 0.25 mg (05/28/2019), 0.25 mg (03/26/2019), 0.25 mg (04/18/2019)  pegfilgrastim-jmdb (FULPHILA) injection 6 mg, 6 mg, Subcutaneous,  Once, 6 of 6 cycles. Administration: 6 mg (02/15/2019), 6 mg (03/06/2019), 6 mg (05/09/2019), 6 mg (05/30/2019), 6 mg (03/28/2019), 6 mg (04/20/2019)  CARBOplatin (PARAPLATIN) 700 mg in sodium chloride 0.9 % 250 mL chemo infusion, 700 mg (100 % of original dose 700 mg), Intravenous,  Once, 6 of 6 cycles. Dose modification: 700 mg (original dose 700 mg, Cycle 1). Administration: 700 mg (02/13/2019), 700 mg (03/04/2019), 700 mg (05/07/2019), 700 mg (05/28/2019), 700 mg (03/26/2019), 700 mg (04/18/2019)  DOCEtaxel (TAXOTERE) 140 mg in sodium chloride 0.9 % 250 mL chemo infusion, 75 mg/m2 = 140 mg, Intravenous,  Once, 6 of 6 cycles. Dose modification: 65 mg/m2 (original dose 75 mg/m2, Cycle 5, Reason: Dose not tolerated). Administration: 140 mg (02/13/2019), 140 mg (03/04/2019), 120 mg (05/07/2019), 120 mg (05/28/2019), 140 mg (03/26/2019), 140 mg (04/18/2019)  pertuzumab (PERJETA) 420 mg in sodium chloride 0.9 % 250 mL chemo infusion, 420 mg (100 % of original dose 420 mg), Intravenous, Once, 3 of 3 cycles. Dose modification: 420 mg (original dose 420 mg, Cycle 1, Reason: Provider Judgment). Administration: 420 mg (02/13/2019), 420 mg (03/04/2019), 420 mg (03/26/2019)  fosaprepitant (EMEND) 150 mg  dexamethasone (DECADRON) 12 mg in sodium chloride 0.9 % 145 mL IVPB, , Intravenous,  Once, 6 of  6 cycles. Administration:  (02/13/2019),  (03/04/2019),  (05/07/2019),  (05/28/2019),  (03/26/2019),  (04/18/2019)  trastuzumab-dkst (OGIVRI) 651 mg in sodium chloride 0.9 % 250 mL chemo infusion, 8 mg/kg = 651 mg, Intravenous,  Once, 6 of 6 cycles. Administration: 651 mg (02/13/2019), 483 mg (03/04/2019), 483 mg (05/07/2019), 483 mg (05/28/2019), 483 mg (03/26/2019), 483 mg (04/18/2019).   06/13/2019 Surgery   Left lumpectomy ([place]) [phone]): no residual carcinoma, 2/3 left axillary lymph nodes positive for carcinoma. Lymphovascular space invasion was present.   06/20/2019 Cancer Staging   Staging form: Breast, AJCC 8th Edition - Pathologic stage from 06/20/2019: No Stage Recommended (ypT0, pN1, cM0, ER+, PR-, HER2-)   07/11/2019 Surgery   Axillary lymph node dissection ([place]) ([id number]): 3/3 lymph nodes positive for cancer   08/19/2019 - 10/07/2019 Radiation Therapy   The patient initially received a dose of 50.4 Gy in 28 fractions to the breast and SCLV region using a 4-field approach. This was delivered using a 3-D conformal technique. The patient then received a boost to the seroma. This delivered an additional 10 Gy in 5 fractions using a 3-field photon boost technique. The total dose was 60.4 Gy.   09/2019 - 09/2029 Anti-estrogen oral therapy   Tamoxifen     ALLERGIES:  is allergic to latex, other, and adhesive [tape].  MEDICATIONS:  Current Outpatient Medications  Medication Sig Dispense Refill   dexamethasone (DECADRON) 4 MG  tablet Take 1 tablet (4 mg total) by mouth 2 (two) times daily with a meal. 60 tablet 1   fentaNYL (DURAGESIC) 25 MCG/HR Place 1 patch onto the skin every 3 (three) days. 10 patch 0   Oxycodone HCl 10 MG TABS Take 1 tablet (10 mg total) by mouth 4 (four) times daily as needed. 60 tablet 0   No current facility-administered medications for this visit.   Facility-Administered Medications Ordered in Other Visits  Medication Dose Route Frequency Provider Last  Rate Last Admin   sodium chloride (PF) 0.9 % injection             VITAL SIGNS: BP 101/76 (BP Location: Right Arm, Patient Position: Sitting)    Pulse (!) 133    Temp 97.6 F (36.4 C) (Oral)    Resp 19    Ht 5' 3"  (1.6 m)    Wt 166 lb 12.8 oz (75.7 kg)    SpO2 97%    BMI 29.55 kg/m  Filed Weights   04/05/21 1122  Weight: 166 lb 12.8 oz (75.7 kg)    Estimated body mass index is 29.55 kg/m as calculated from the following:   Height as of this encounter: 5' 3"  (1.6 m).   Weight as of this encounter: 166 lb 12.8 oz (75.7 kg).  LABS: CBC:    Component Value Date/Time   WBC 6.1 03/26/2021 1246   WBC 5.8 03/22/2021 0955   HGB 12.0 03/26/2021 1246   HCT 34.4 (L) 03/26/2021 1246   PLT 266 03/26/2021 1246   MCV 85.6 03/26/2021 1246   NEUTROABS 4.2 03/26/2021 1246   LYMPHSABS 1.2 03/26/2021 1246   MONOABS 0.7 03/26/2021 1246   EOSABS 0.0 03/26/2021 1246   BASOSABS 0.0 03/26/2021 1246   Comprehensive Metabolic Panel:    Component Value Date/Time   NA 129 (L) 03/26/2021 1246   K 3.7 03/26/2021 1246   CL 92 (L) 03/26/2021 1246   CO2 26 03/26/2021 1246   BUN 5 (L) 03/26/2021 1246   CREATININE 0.61 03/26/2021 1246   GLUCOSE 88 03/26/2021 1246   GLUCOSE 87 03/31/2006 1359   CALCIUM 9.2 03/26/2021 1246   AST 284 (HH) 03/26/2021 1246   ALT 308 (HH) 03/26/2021 1246   ALKPHOS 249 (H) 03/26/2021 1246   BILITOT 4.3 (HH) 03/26/2021 1246   PROT 8.0 03/26/2021 1246   ALBUMIN 3.4 (L) 03/26/2021 1246    RADIOGRAPHIC STUDIES: CT Chest W Contrast  Result Date: 04/05/2021 CLINICAL DATA:  History of left breast cancer, completed therapy in 2021, now with biopsy-proven metastatic recurrence in the outer left breast and left axilla. Restaging. Dyspnea on exertion. EXAM: CT CHEST WITH CONTRAST TECHNIQUE: Multidetector CT imaging of the chest was performed during intravenous contrast administration. CONTRAST:  77m OMNIPAQUE IOHEXOL 350 MG/ML SOLN COMPARISON:  01/25/2021 chest CT angiogram.  03/22/2021 CT abdomen/pelvis. FINDINGS: Cardiovascular: Normal heart size. No significant pericardial effusion/thickening. Great vessels are normal in course and caliber. No central pulmonary emboli. Mediastinum/Nodes: No discrete thyroid nodules. Unremarkable esophagus. No right axillary adenopathy. Bilateral supraclavicular lymphadenopathy is new since 01/25/2021 chest CT, measuring 2.3 cm short axis diameter on the right (series 2/image 19) and 1.6 cm on the left (series 2/image 21). Multiple poorly marginated left axillary lymph nodes, largest 2.3 cm short axis diameter with internal biopsy clip (series 2/image 47), increased from 1.8 cm on 01/25/2021 using similar measurement technique. Infiltrative high left paratracheal 1.9 cm node (series 2/image 31), increased from 1.5 cm. Multiple enlarged prevascular nodes, largest 1.5 cm (  series 2/image 40), increased from 1.3 cm. Newly enlarged 1.9 cm right hilar node (series 2/image 63). Enlarged 1.4 cm left hilar node (series 2/image 68), minimally increased from 1.3 cm. Newly enlarged bilateral retrocrural nodes, largest 1.1 cm on the right (series 2/image 112). Enlarged 1.3 cm left internal mammary node (series 2/image 41), increased from 1.0 cm. Lungs/Pleura: No pneumothorax. Small dependent right pleural effusion, new. No left pleural effusion. Near complete right lower lobe atelectasis and partial right middle lobe atelectasis is new. New tiny 0.3 cm solid anterior right middle lobe nodule (series 5/image 89). No lung masses or additional significant pulmonary nodules. Upper abdomen: Small hiatal hernia. Infiltrative 11.7 x 9.1 cm posterior right liver mass (series 2/image 120), not substantially changed since 03/22/2021 CT abdomen study. Numerous additional hypodense liver masses scattered throughout the liver, increased since 03/22/2021 CT, for example measuring 1.7 cm in the peripheral right liver (series 2/image 97), increased from 0.9 cm. Musculoskeletal: No  aggressive appearing focal osseous lesions. Multifocal left breast masses, only partially visualized on this CT study, several of which appear to involve the left pectoralis musculature, for example measuring 1.4 cm in the posterior left breast (series 2/image 88), increased from 0.7 cm on 10/31 chest CT, and 1.5 cm in the medial posterior left breast (series 2/image 97), increased from 0.9 cm. IMPRESSION: 1. Significant interval progression of metastatic disease in the bilateral supraclavicular, left axillary, mediastinal, bilateral hilar, bilateral retrocrural and left internal mammary chains. 2. Multifocal left breast masses, only partially visualized on this CT study, several of which appear to involve the left pectoralis musculature, increased. 3. New small dependent right pleural effusion. Near complete right lower lobe and partial right middle lobe atelectasis. 4. New tiny 0.3 cm right middle lobe pulmonary nodule, indeterminate, cannot exclude pulmonary metastasis. 5. Progressive multifocal liver metastases. 6. Small hiatal hernia. Electronically Signed   By: Ilona Sorrel M.D.   On: 04/05/2021 14:38   CT ABDOMEN PELVIS W CONTRAST  Result Date: 03/22/2021 CLINICAL DATA:  Abdominal pain. Flank pain. Decreased appetite. History of breast cancer. EXAM: CT ABDOMEN AND PELVIS WITH CONTRAST TECHNIQUE: Multidetector CT imaging of the abdomen and pelvis was performed using the standard protocol following bolus administration of intravenous contrast. CONTRAST:  174m OMNIPAQUE IOHEXOL 300 MG/ML  SOLN COMPARISON:  Chest CT of 01/25/2021.  No prior abdominopelvic CTs. FINDINGS: Lower chest: Clear lung bases. Small right pleural effusion is new since the prior chest CT. Low left mediastinal necrotic adenopathy, including at 8 mm on 03/02. Central left breast mass of 2.6 x 2.8 cm on 01/02 is new or more well-defined than on the prior. Necrotic nodes or nodules within the left breast and along the left pectoralis  musculature including at 1.3 cm on 07/02. Hepatobiliary: Hepatomegaly at 23 cm craniocaudal. Bilateral hepatic metastasis. A dominant right hepatic lobe mass measures on the order of 10.7 x 6.9 cm on 15/2. Index segment 3 lesion measures 1.1 cm on 23/2. Probable vicarious excretion of contrast by the gallbladder. Gallbladder wall thickening at 5 mm without specific evidence of acute cholecystitis. No biliary duct dilatation. Pancreas: Normal, without mass or ductal dilatation. Spleen: Normal in size, without focal abnormality. Adrenals/Urinary Tract: Mild right adrenal nodularity. Normal left adrenal gland. Normal kidneys, without hydronephrosis. Normal urinary bladder. Stomach/Bowel: Gastric antral underdistention. Colonic stool burden suggests constipation. Cecum extends into the upper central pelvis. Normal terminal ileum and appendix. Normal small bowel. Vascular/Lymphatic: Aortic atherosclerosis. Left periaortic 11 mm abdominal retroperitoneal node on 36/2. Porta hepatis  adenopathy is mild at 1.3 cm on 26/2. There is also enlarged retrocaval node at 1.2 cm on 28/2. 9 mm retrocrural node is newly enlarged since the prior chest CT. No pelvic sidewall adenopathy. Reproductive: Normal uterus and adnexa. Prominent gonadal veins, as can be seen with pelvic congestion syndrome. Other: Trace free pelvic fluid is likely physiologic. Nonspecific mild edema and minimal fluid within the anterior pararenal space of the right hemiabdomen on 48/2. Musculoskeletal: Degenerative disc disease at the lumbosacral junction. IMPRESSION: 1. Widespread metastatic disease, including to the liver, thoracoabdominal nodes, left breast/chest wall. 2. No bowel obstruction or other acute complication. 3. Trace edema and fluid within the right abdominal anterior perirenal space. No source identified. This could be correlated with pancreatic enzyme levels, to exclude unlikely pancreatic source. 4. Gallbladder wall thickening, without specific  evidence of acute cholecystitis. Correlate with right upper quadrant symptoms and possibly ultrasound. 5. Small right pleural effusion. 6. Left breast mass is more well-defined today and may represent recurrent or residual primary, given clinical history. Electronically Signed   By: Abigail Miyamoto M.D.   On: 03/22/2021 11:57   US BREAST LTD UNI LEFT INC AXILLA  Addendum Date: 03/25/2021   ADDENDUM REPORT: 03/25/2021 07:38 ADDENDUM: In the physical exam portion, the statement should read: On physical examination, the LEFT breast is enlarged and there is a very firm palpable thickening within the OUTER central LEFT breast. Electronically Signed   By: Margarette Canada M.D.   On: 03/25/2021 07:38   Result Date: 03/25/2021 CLINICAL DATA:  46 year old female with history of LEFT breast cancer with LEFT lumpectomy and chemotherapy treatment in 2020. New LEFT breast masses, enlarged LEFT axillary lymph nodes and metastatic disease involving the liver and thoracoabdominal lymph nodes on recent CT scans. EXAM: DIGITAL DIAGNOSTIC BILATERAL MAMMOGRAM WITH TOMOSYNTHESIS AND CAD; ULTRASOUND LEFT BREAST LIMITED TECHNIQUE: Bilateral digital diagnostic mammography and breast tomosynthesis was performed. The images were evaluated with computer-aided detection.; Targeted ultrasound examination of the left breast was performed. COMPARISON:  03/22/2021 and 01/17/2021 CTs. 04/02/2020 and prior mammograms. 05/29/2019 breast MR. ACR Breast Density Category c: The breast tissue is heterogeneously dense, which may obscure small masses. FINDINGS: 2D/3D full field views of both breasts and a magnification view of the lumpectomy site are performed. New increased density within the central and OUTER LEFT breast identified with decreased volume of the LEFT breast. No new or suspicious RIGHT breast findings are noted. On physical exam, the RIGHT breast enlarged in there is a very firm palpable thickening within the OUTER central LEFT breast.  Targeted ultrasound of the LEFT breast is performed, showing heterogeneous tissue throughout the majority of the LEFT breast. Discrete findings on the LEFT are as follows: A 4.6 x 2 x 3 cm irregular hypoechoic LEFT breast mass deep at the 3 o'clock position 1 cm from the nipple. A 0.7 x 1 x 1.4 cm hypoechoic LEFT breast area/mass within the posterior 6 o'clock position 2 cm from the nipple. A 0.8 cm ill-defined LEFT breast hypoechoic mass at the 8 o'clock position 4 cm from the nipple. A 2.2 x 1.5 x 2.1 cm heterogeneous superficial mass in the LEFT axilla. At least 1 abnormal LEFT axillary lymph node, with cortical thickening of 1 cm. Other enhancing masses within the LEFT breast identified on recent CT are not well visualized sonographic due to heterogeneity of the LEFT breast parenchyma. IMPRESSION: 1. Highly suspicious masses within the LEFT breast and LEFT axilla, and at least 1 abnormal LEFT axillary lymph node.  Tissue sampling of the largest highly suspicious mass which measures 4.6 cm at the 3 o'clock position 1 cm from the nipple and tissue sampling of the abnormal LEFT axillary lymph node are recommended, as clinically indicated. 2. Other highly suspicious smaller masses within the LEFT breast identified on recent CT are not well visualized mammographically or sonographically given breast tissue density/heterogeneity. 3.  ( RECOMMENDATION: Ultrasound-guided biopsy of 4.6 cm OUTER central LEFT breast mass and ultrasound-guided biopsy of the abnormal LEFT axillary lymph node, which have been scheduled. If tissue sampling of the LEFT breast/LEFT axilla is not warranted clinically (given hepatic and thoracoabdominal lymph node metastases), then these procedures can be canceled. Breast MRI may be helpful given high likelihood of diffuse malignancy within the LEFT breast. I have discussed the findings and recommendations with the patient. If applicable, a reminder letter will be sent to the patient regarding the  next appointment. BI-RADS CATEGORY  5: Highly suggestive of malignancy. Electronically Signed: By: Margarette Canada M.D. On: 03/24/2021 15:39  MM DIAG BREAST TOMO BILATERAL  Addendum Date: 03/25/2021   ADDENDUM REPORT: 03/25/2021 07:38 ADDENDUM: In the physical exam portion, the statement should read: On physical examination, the LEFT breast is enlarged and there is a very firm palpable thickening within the OUTER central LEFT breast. Electronically Signed   By: Margarette Canada M.D.   On: 03/25/2021 07:38   Result Date: 03/25/2021 CLINICAL DATA:  46 year old female with history of LEFT breast cancer with LEFT lumpectomy and chemotherapy treatment in 2020. New LEFT breast masses, enlarged LEFT axillary lymph nodes and metastatic disease involving the liver and thoracoabdominal lymph nodes on recent CT scans. EXAM: DIGITAL DIAGNOSTIC BILATERAL MAMMOGRAM WITH TOMOSYNTHESIS AND CAD; ULTRASOUND LEFT BREAST LIMITED TECHNIQUE: Bilateral digital diagnostic mammography and breast tomosynthesis was performed. The images were evaluated with computer-aided detection.; Targeted ultrasound examination of the left breast was performed. COMPARISON:  03/22/2021 and 01/17/2021 CTs. 04/02/2020 and prior mammograms. 05/29/2019 breast MR. ACR Breast Density Category c: The breast tissue is heterogeneously dense, which may obscure small masses. FINDINGS: 2D/3D full field views of both breasts and a magnification view of the lumpectomy site are performed. New increased density within the central and OUTER LEFT breast identified with decreased volume of the LEFT breast. No new or suspicious RIGHT breast findings are noted. On physical exam, the RIGHT breast enlarged in there is a very firm palpable thickening within the OUTER central LEFT breast. Targeted ultrasound of the LEFT breast is performed, showing heterogeneous tissue throughout the majority of the LEFT breast. Discrete findings on the LEFT are as follows: A 4.6 x 2 x 3 cm  irregular hypoechoic LEFT breast mass deep at the 3 o'clock position 1 cm from the nipple. A 0.7 x 1 x 1.4 cm hypoechoic LEFT breast area/mass within the posterior 6 o'clock position 2 cm from the nipple. A 0.8 cm ill-defined LEFT breast hypoechoic mass at the 8 o'clock position 4 cm from the nipple. A 2.2 x 1.5 x 2.1 cm heterogeneous superficial mass in the LEFT axilla. At least 1 abnormal LEFT axillary lymph node, with cortical thickening of 1 cm. Other enhancing masses within the LEFT breast identified on recent CT are not well visualized sonographic due to heterogeneity of the LEFT breast parenchyma. IMPRESSION: 1. Highly suspicious masses within the LEFT breast and LEFT axilla, and at least 1 abnormal LEFT axillary lymph node. Tissue sampling of the largest highly suspicious mass which measures 4.6 cm at the 3 o'clock position 1 cm from  the nipple and tissue sampling of the abnormal LEFT axillary lymph node are recommended, as clinically indicated. 2. Other highly suspicious smaller masses within the LEFT breast identified on recent CT are not well visualized mammographically or sonographically given breast tissue density/heterogeneity. 3.  ( RECOMMENDATION: Ultrasound-guided biopsy of 4.6 cm OUTER central LEFT breast mass and ultrasound-guided biopsy of the abnormal LEFT axillary lymph node, which have been scheduled. If tissue sampling of the LEFT breast/LEFT axilla is not warranted clinically (given hepatic and thoracoabdominal lymph node metastases), then these procedures can be canceled. Breast MRI may be helpful given high likelihood of diffuse malignancy within the LEFT breast. I have discussed the findings and recommendations with the patient. If applicable, a reminder letter will be sent to the patient regarding the next appointment. BI-RADS CATEGORY  5: Highly suggestive of malignancy. Electronically Signed: By: Margarette Canada M.D. On: 03/24/2021 15:39   [place] AXILLARY NODE CORE BIOPSY LEFT  Addendum  Date: 04/02/2021   ADDENDUM REPORT: 04/02/2021 12:21 ADDENDUM: 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 This was found to be concordant by Dr. Peggye Fothergill. Pathology revealed INVASIVE DUCTAL CARCINOMA WITH NECROSIS, NO NODAL TISSUE IDENTIFIED of the LEFT axilla, (tribell clip). This was found to be concordant by Dr. Peggye Fothergill. Pathology results were discussed with the patient by telephone. The patient reported doing well after the biopsies with tenderness at the sites. Post biopsy instructions and care were reviewed and questions were answered. The patient was encouraged to call [facility] for any additional concerns. My direct phone number was provided. The patient has a diagnosis of metastatic LEFT breast cancer and should follow her outlined treatment plan. The patient is scheduled for a telephone visit with Dr. Nicholas Lose at [facility] on April 07, 2021. Dr. Lindi Adie; Wilber Bihari, NP; Bary Castilla and Iris Pert, Oncology RN Nurse Navigators, were notified of biopsy results via EPIC message on April 02, 2021. Pathology results reported by Terie Purser, RN on 04/02/2021. Electronically Signed   By: Evangeline Dakin M.D.   On: 04/02/2021 12:21   Result Date: 04/02/2021 CLINICAL DATA:  46 year old with a personal history of malignant lumpectomy of the UPPER OUTER QUADRANT of the LEFT breast and targeted LEFT axillary node dissection in March, 2020 (after neoadjuvant chemotherapy), with LEFT axillary node dissection in April, 2021. She now has multiple masses throughout the LEFT breast, the largest in the outer breast at the 3 o'clock location measuring 4.6 cm. 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 The largest LEFT breast mass and a pathologic LEFT axillary lymph node are targeted  for biopsy. EXAM: ULTRASOUND-GUIDED CORE NEEDLE BIOPSY OF THE LEFT BREAST ULTRASOUND-GUIDED CORE NEEDLE BIOPSY OF A LEFT AXILLARY NODE COMPARISON:  Previous exam(s). FINDINGS: I met with the patient and we discussed the procedure of ultrasound-guided biopsy, including benefits and alternatives. We discussed the high likelihood of a successful procedure. We discussed the risks of the procedure, including infection, bleeding, tissue injury, clip migration, and inadequate sampling. Informed written consent was given. The usual time-out protocol was performed immediately prior to the procedure. # 1) LEFT breast, lesion quadrant: Outer breast, 3 o'clock location. Using sterile technique with chlorhexidine as skin antisepsis, 1% Lidocaine as local anesthetic, under direct ultrasound visualization, a 12 gauge Bard Marquee core needle device placed through an 11 gauge  introducer needle was used to perform biopsy of the largest LEFT breast mass at the 3 o'clock location using a lateral approach. At the conclusion of the procedure, a ribbon shaped tissue marker clip was deployed into the biopsy cavity. # 2) LEFT axillary lymph node: Using sterile technique with chlorhexidine as skin antisepsis, 1% lidocaine as local anesthetic, under direct ultrasound visualization, a 14 gauge Bard Marquee core needle device placed through a 13 gauge introducer needle was used perform biopsy the pathologic LEFT axillary lymph node using an inferolateral approach. At the conclusion of the procedure, a Tribell tissue marker clip was deployed into the biopsy cavity. There were no apparent immediate complications. Follow up 2 view mammogram was performed and dictated separately. IMPRESSION: 1. Ultrasound-guided core needle biopsy of a mass involving the outer LEFT breast at the 3 o'clock location. 2. Ultrasound-guided core needle biopsy of an abnormal LEFT axillary lymph node. Electronically Signed: By: Evangeline Dakin M.D. On: 04/01/2021  10:31  MM CLIP PLACEMENT LEFT  Result Date: 04/01/2021 CLINICAL DATA:  Confirmation clip placement after ultrasound-guided core needle biopsy of a mass involving the inner LEFT breast at the 3 o'clock location and ultrasound-guided core needle biopsy of a pathologic LEFT axillary lymph node. EXAM: 2D and 3D DIAGNOSTIC LEFT MAMMOGRAM POST ULTRASOUND BIOPSY COMPARISON:  Previous exam(s). FINDINGS: 2D and 3D full field CC and mediolateral images were obtained following ultrasound guided core needle biopsy of a mass in the inner LEFT breast and biopsy of a LEFT axillary lymph node. The ribbon shaped tissue biopsy marking clip is appropriately positioned within the biopsied mass in the inner LEFT breast at the 3 o'clock location. The Tribell tissue marker clip is appropriately position within the biopsied lymph node in the LEFT axilla. Expected post biopsy changes are present at both sites without evidence of hematoma. IMPRESSION: 1. Appropriate positioning of the ribbon shaped biopsy marking clip within the biopsied mass in the outer LEFT breast at the 3 o'clock location. 2. Appropriate positioning of the Tribell biopsy marking clip within the biopsied lymph node in the LEFT axilla. Final Assessment: Post Procedure Mammograms for Marker Placement Electronically Signed   By: Evangeline Dakin M.D.   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 This was found to be concordant by Dr. Peggye Fothergill. Pathology revealed INVASIVE DUCTAL CARCINOMA WITH NECROSIS, NO NODAL TISSUE IDENTIFIED of the LEFT axilla, (tribell clip). This was found to be concordant by Dr. Peggye Fothergill. Pathology results were discussed with the patient by telephone. The patient reported doing well after the biopsies  with tenderness at the sites. Post biopsy instructions and care were reviewed and questions were answered. The patient was encouraged to call [facility] for any additional concerns. My direct phone number was provided. The patient has a diagnosis of metastatic LEFT breast cancer and should follow her outlined treatment plan. The patient is scheduled for a telephone visit with Dr. Nicholas Lose at [facility] on April 07, 2021. Dr. Lindi Adie; Wilber Bihari, NP; Bary Castilla and Iris Pert, Oncology RN Nurse Navigators, were notified of biopsy results via EPIC message on April 02, 2021. Pathology results reported by Terie Purser, RN on 04/02/2021. Electronically Signed   By: Sherran Needs.D.  On: 04/02/2021 12:21   Result Date: 04/02/2021 CLINICAL DATA:  46 year old with a personal history of malignant lumpectomy of the UPPER OUTER QUADRANT of the LEFT breast and targeted LEFT axillary node dissection in March, 2020 (after neoadjuvant chemotherapy), with LEFT axillary node dissection in April, 2021. She now has multiple masses throughout the LEFT breast, the largest in the outer breast at the 3 o'clock location measuring 4.6 cm. 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 The largest LEFT breast mass and a pathologic LEFT axillary lymph node are targeted for biopsy. EXAM: ULTRASOUND-GUIDED CORE NEEDLE BIOPSY OF THE LEFT BREAST ULTRASOUND-GUIDED CORE NEEDLE BIOPSY OF A LEFT AXILLARY NODE COMPARISON:  Previous exam(s). FINDINGS: I met with the patient and we discussed the procedure of ultrasound-guided biopsy, including benefits and alternatives. We discussed the high likelihood of a successful procedure. We discussed the risks of the procedure, including infection, bleeding, tissue injury, clip migration, and inadequate sampling. Informed written consent was given. The usual  time-out protocol was performed immediately prior to the procedure. # 1) LEFT breast, lesion quadrant: Outer breast, 3 o'clock location. Using sterile technique with chlorhexidine as skin antisepsis, 1% Lidocaine as local anesthetic, under direct ultrasound visualization, a 12 gauge Bard Marquee core needle device placed through an 11 gauge introducer needle was used to perform biopsy of the largest LEFT breast mass at the 3 o'clock location using a lateral approach. At the conclusion of the procedure, a ribbon shaped tissue marker clip was deployed into the biopsy cavity. # 2) LEFT axillary lymph node: Using sterile technique with chlorhexidine as skin antisepsis, 1% lidocaine as local anesthetic, under direct ultrasound visualization, a 14 gauge Bard Marquee core needle device placed through a 13 gauge introducer needle was used perform biopsy the pathologic LEFT axillary lymph node using an inferolateral approach. At the conclusion of the procedure, a Tribell tissue marker clip was deployed into the biopsy cavity. There were no apparent immediate complications. Follow up 2 view mammogram was performed and dictated separately. IMPRESSION: 1. Ultrasound-guided core needle biopsy of a mass involving the outer LEFT breast at the 3 o'clock location. 2. Ultrasound-guided core needle biopsy of an abnormal LEFT axillary lymph node. Electronically Signed: By: Evangeline Dakin M.D. On: 04/01/2021 10:31   PERFORMANCE STATUS (ECOG) : 2 - Symptomatic, <50% confined to bed  Review of Systems  Constitutional:  Positive for appetite change and fatigue.  Eyes:        Jaundice  Musculoskeletal:  Positive for arthralgias and back pain.  Neurological:  Positive for weakness.  Unless otherwise noted, a complete review of systems is negative.  Physical Exam General: NAD, ill-appearing HEENT: sclera icterus  Cardiovascular: regular rate and rhythm Pulmonary: clear ant fields Abdomen: soft, tender, + bowel  sounds Extremities: no edema, no joint deformities Skin: no rashes Neurological: Weakness but otherwise nonfocal  IMPRESSION:  This is my initial palliative visit with Ms. Wiedeman.  She presents to the clinic alone.  States her friend brought her to the appointment today.  She also had scheduled CT scan prior to our visit.  Reports she is anxiously waiting to speak with Dr. Lindi Adie later this week regarding the results.  I introduced myself, Advertising copywriter, and Palliative's role in collaboration with the oncology team. Concept of Palliative Care was introduced as specialized medical care for people and their families living with serious illness.  It focuses on providing relief from the  symptoms and stress of a serious illness.  The goal is to improve quality of life for both the patient and the family. Values and goals of care important to patient and family were attempted to be elicited.   Legend shares that she lives in the home with her sister, [name].  She has 2 children who she has a relationship with.  She works for [employer] but is currently on leave.   Patient reports she is independent in ADLs although requires frequent rest breaks.  Is currently not driving due to weakness and fatigue.  States she is ambulatory in the home however gait seems to be more unsteady and is requesting walker for assistance and stability.  Pain related to neoplasm Ms. Canedo reports her pain is generalized however is more severe in her back and abdomen.  Feels her current regimen is providing better relief.  She was started on fentanyl 25 mcg patch by Dr. Lindi Adie on 03/30/2020 in addition to oxycodone 10 mg 4 times a day as needed for breakthrough pain.  She reports her pain is much better than it was a week ago.  We discussed continuing with current regimen given pain is controlled but we will plan to continue to closely follow for any changes.  She reports she is only taking oxycodone 1-2 times daily.  Decreased appetite/weight  loss Continues to endorse poor appetite.  Her weight today is 166.12 pounds, weight on 12/30 was 169 pounds, weight on 11/2 was 175.11 pounds.  Continues to consistently lose weight due to poor nutrition.  She reports she has no appetite and when she does she becomes full after 4-5 bites.  She was prescribed dexamethasone by Dr. Lindi Adie with hopes of stimulating appetite.  She reports she has not been taking this due to the directions expressing take medication with food and when she has no appetite she did not feel she was able to eat enough to take the medication.  Detailed education provided on the use of dexamethasone and options outside of a full meal to be able to take medication and hopefully see some benefits.  She does endorse drinking 1-2 ensures daily.  Advised she can also take medication when drinking Ensure.  Education provided on increasing protein intake with recommendations on continued Ensure, peanut butter, meats.  Encouraged small frequent meals versus trying to eat 3 large meals a day and to eat at anytime she has a hunger sensation.  She verbalized understanding.  Constipation [place] reports her biggest concern is constipation now that her pain seems better controlled.  States she has not had a bowel movement in over 8 days.  Denies nausea/vomiting.  Discussed adhering to a strenuous bowel regimen in the setting of opioid use.  Education provided with recommendations on beginning MiraLAX twice daily however once daily at minimum as requested in addition to senna.  She verbalized understanding and instructions written out as requested.   We discussed Her current illness and what it means in the larger context of Her on-going co-morbidities. Natural disease trajectory and expectations were discussed.  Sahana states she is anxiously waiting upcoming CT scan and having conversation with Dr. Lindi Adie regarding findings and future treatment options if any.  She shares she is open to any and all  treatments and is also aware of the severity of her disease process.  I discussed the importance of continued conversation with family and their medical providers regarding overall plan of care and treatment options, ensuring decisions are within the context  of the patients values and GOCs.  PLAN: Continue fentanyl 25 mcg patch as previously prescribed.  Patient reporting pain well controlled. Oxycodone 10 mg as needed for breakthrough pain. MiraLAX twice daily in addition to senna daily for bowel regimen.  Patient knows to start first dose on today due to no bowel movement in more than 8 days. Dexamethasone as previously prescribed for appetite stimulant.  Education provided on the use of medication and how to take.  Patient has not been taking prior to today. Encouraged increased protein intake with discussions around protein enriched foods, ensures, and meats.  Recommendations for small frequent meals throughout the day versus large meals.  Eating when hunger sensation. Will order walker for in-home use due to gait instability and ongoing weakness. Recommend close follow-up and ongoing goals of care discussion in the setting of widespread metastatic disease.  Patient was given advanced directive with request to attend clinic on February 13. I will plan to see patient back in 2-3 weeks in collaboration to other oncology appointments.    Patient expressed understanding and was in agreement with this plan. She also understands that She can call the clinic at any time with any questions, concerns, or complaints.   Time Total: 55 min  Visit consisted of counseling and education dealing with the complex and emotionally intense issues of symptom management and palliative care in the setting of serious and potentially life-threatening illness.Greater than 50%  of this time was spent counseling and coordinating care related to the above assessment and plan.  Signed by: Alda Lea,  AGPCNP-BC Palliative Medicine Team

## 2021-04-05 NOTE — Progress Notes (Signed)
Placed order for Rollator through [facility] with [employer] to be delivered to Paige Perry's home.

## 2021-04-06 NOTE — Assessment & Plan Note (Signed)
01/18/2019:Patient palpated a left breast lump for 2 weeks. Mammogram showed a 2.3cm left breast mass at the 12 o'clock position, with 1 enlarged left axillary lymph node measuring 3.5cm. 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 T2N1 stage IIa clinical stage  Treatment plan: 1. Neoadjuvant chemotherapy with TCH Perjeta 6 cycles (maintenance anti-HER-2 therapy is not being given because the initial pathology was reread as HER-2 negative.  The final pathology on the lumpectomy was also HER-2 negative) ER 60% week, PR negative 2. 06/13/2019 left lumpectomy ([facility]): no residual carcinoma, 2/3 left axillary lymph nodes positive for carcinoma. 07/15/2019: ALN D: 3/3 lymph nodes positive.  No extranodal extension 3. Followed by adjuvant radiation therapy started 08/20/2019 No adverse effects from participation in the neuropathy clinical trial SWOG [id number] CT Chest 01/25/21: No PE, Lymphadenopathy in mediastinum and hilar regions, Mass vs LN in axillary tain METASTATIC progression: 03/23/2021: Widespread metastatic disease including the liver thoracoabdominal nodes and left chest wall/breast.  Gallbladder wall thickening, small right pleural effusion. 03/25/2021: Diagnostic mammogram showing several left breast masses concerning for malignancy the largest being 4.6 cm.  --------------------------------------------------------------------------------------------------------------------------------------------------------------------------------------------------------------------------------------  1.  Metastatic breast cancer:  recommended Zoladex, letrozole, Verzenio.  Paige Perry will proceed with biopsy of the left breast mass/lymph node with the breast center on January 5 as scheduled.  2.  Cancer related pain: 5 mg of oxycodone.  3.  Decreased appetite: This is likely  secondary to the location of her liver metastasis along with the cancer.  Dr. Lindi Adie prescribed dexamethasone 2 mg oral daily to help increase her appetite.  Paige Perry has a follow-up virtual visit with Dr. Lindi Adie on April 07, 2020.  At that time they will discuss her pathology results.

## 2021-04-07 ENCOUNTER — Inpatient Hospital Stay (HOSPITAL_BASED_OUTPATIENT_CLINIC_OR_DEPARTMENT_OTHER): Payer: BC Managed Care – PPO | Admitting: Hematology and Oncology

## 2021-04-07 ENCOUNTER — Ambulatory Visit: Payer: Self-pay | Admitting: Surgery

## 2021-04-07 DIAGNOSIS — Z170 Estrogen receptor positive status [ER+]: Secondary | ICD-10-CM

## 2021-04-07 DIAGNOSIS — C50412 Malignant neoplasm of upper-outer quadrant of left female breast: Secondary | ICD-10-CM

## 2021-04-07 DIAGNOSIS — Z5111 Encounter for antineoplastic chemotherapy: Secondary | ICD-10-CM | POA: Diagnosis not present

## 2021-04-07 MED ORDER — LIDOCAINE-PRILOCAINE 2.5-2.5 % EX CREA: TOPICAL_CREAM | CUTANEOUS | 3 refills | Status: AC

## 2021-04-07 MED ORDER — ONDANSETRON HCL 8 MG PO TABS: 8.0000 mg | ORAL_TABLET | Freq: Two times a day (BID) | ORAL | 1 refills | Status: DC | PRN

## 2021-04-07 MED ORDER — PROCHLORPERAZINE MALEATE 10 MG PO TABS: 10.0000 mg | ORAL_TABLET | Freq: Four times a day (QID) | ORAL | 1 refills | Status: DC | PRN

## 2021-04-07 NOTE — Progress Notes (Signed)
DISCONTINUE ON PATHWAY REGIMEN - Breast     A cycle is every 21 days:     Pertuzumab      Pertuzumab      Trastuzumab-xxxx      Trastuzumab-xxxx      Carboplatin      Docetaxel   **Always confirm dose/schedule in your pharmacy ordering system**  REASON: Disease Progression PRIOR TREATMENT: [id number]: Docetaxel + Carboplatin + Trastuzumab + Pertuzumab (TCHP) q21 Days x 6 Cycles TREATMENT RESPONSE: Unable to Evaluate  START OFF PATHWAY REGIMEN - Breast   [id number]:Nab-Paclitaxel 100 mg/m2 IV D1,8,15 q28 Days + Pembrolizumab 200 mg IV D1 q21 Days:   Pembrolizumab: A cycle is every 21 days:     Pembrolizumab    Chemotherapy: A cycle is every 28 days:     Nab-paclitaxel (protein bound)   **Always confirm dose/schedule in your pharmacy ordering system**  Patient Characteristics: Distant Metastases or Locoregional Recurrent Disease - Unresected or Locally Advanced Unresectable Disease Progressing after Neoadjuvant and Local Therapies, HER2 Low/Negative/Unknown, ER Negative/Unknown, Chemotherapy, HER2 Negative/Unknown, First Line,  ER Negative, PD-L1 Expression Negative/Unknown Therapeutic Status: Distant Metastases HER2 Status: Negative (-) ER Status: Negative (-) PR Status: Negative (-) Therapy Approach Indicated: Standard Chemotherapy/Endocrine Therapy Line of Therapy: First Line PD-L1 Expression Status: Awaiting Test Results Intent of Therapy: Non-Curative / Palliative Intent, Discussed with Patient

## 2021-04-07 NOTE — Progress Notes (Signed)
Patient Care Team: [name], NP as PCP - General Mauro Kaufmann, RN as Oncology Nurse Navigator Rockwell Germany, RN as Oncology Nurse Navigator Donnie Mesa, MD as Consulting Physician (General Surgery) Nicholas Lose, MD as Consulting Physician (Hematology and Oncology) Kyung Rudd, MD as Consulting Physician (Radiation Oncology)  DIAGNOSIS:  Encounter Diagnosis  Name Primary?   Malignant neoplasm of upper-outer quadrant of left breast in female, estrogen receptor positive ([place]) Yes    SUMMARY OF ONCOLOGIC HISTORY: Oncology History  Malignant neoplasm of upper-outer quadrant of left breast in female, estrogen receptor positive ([place])  01/18/2019 Initial Diagnosis   Patient palpated a left breast lump for 2 weeks. Mammogram showed a 2.3cm left breast mass at the 12 o'clock position, with 1 enlarged left axillary lymph node measuring 3.5cm. 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   01/23/2019 Cancer Staging   Staging form: Breast, AJCC 8th Edition - Clinical stage from 01/23/2019: Stage IIA (cT2, cN1(f), cM0, G2, ER+, PR-, HER2+)   02/01/2019 Genetic Testing   Negative genetic testing. No pathogenic variants identified on the [facility] Common Hereditary Cancers Panel. The Common Hereditary Cancers Panel offered by [facility] includes sequencing and/or deletion duplication testing of the following 48 genes: APC, ATM, AXIN2, BARD1, BMPR1A, BRCA1, BRCA2, BRIP1, CDH1, CDKN2A (p14ARF), CDKN2A (p16INK4a), CKD4, CHEK2, CTNNA1, DICER1, EPCAM (Deletion/duplication testing only), GREM1 (promoter region deletion/duplication testing only), KIT, MEN1, MLH1, MSH2, MSH3, MSH6, MUTYH, NBN, NF1, NHTL1, PALB2, PDGFRA, PMS2, POLD1, POLE, PTEN, RAD50, RAD51C, RAD51D, RNF43, SDHB, SDHC, SDHD, SMAD4, SMARCA4. STK11, TP53, TSC1, TSC2, and VHL.  The following genes were evaluated for sequence changes only: SDHA and HOXB13 c.251G>A variant only. The report date is 02/01/2019.     02/13/2019 - 05/28/2019 Chemotherapy   dexamethasone (DECADRON) 4 MG tablet, 4 mg (100 % of original dose 4 mg), Oral, Daily, 1 of 1 cycle, Start date: 01/23/2019, End date: 05/28/2019. Dose modification: 4 mg (original dose 4 mg, Cycle 0)  palonosetron (ALOXI) injection 0.25 mg, 0.25 mg, Intravenous,  Once, 6 of 6 cycles. Administration: 0.25 mg (02/13/2019), 0.25 mg (03/04/2019), 0.25 mg (05/07/2019), 0.25 mg (05/28/2019), 0.25 mg (03/26/2019), 0.25 mg (04/18/2019)  pegfilgrastim-jmdb (FULPHILA) injection 6 mg, 6 mg, Subcutaneous,  Once, 6 of 6 cycles. Administration: 6 mg (02/15/2019), 6 mg (03/06/2019), 6 mg (05/09/2019), 6 mg (05/30/2019), 6 mg (03/28/2019), 6 mg (04/20/2019)  CARBOplatin (PARAPLATIN) 700 mg in sodium chloride 0.9 % 250 mL chemo infusion, 700 mg (100 % of original dose 700 mg), Intravenous,  Once, 6 of 6 cycles. Dose modification: 700 mg (original dose 700 mg, Cycle 1). Administration: 700 mg (02/13/2019), 700 mg (03/04/2019), 700 mg (05/07/2019), 700 mg (05/28/2019), 700 mg (03/26/2019), 700 mg (04/18/2019)  DOCEtaxel (TAXOTERE) 140 mg in sodium chloride 0.9 % 250 mL chemo infusion, 75 mg/m2 = 140 mg, Intravenous,  Once, 6 of 6 cycles. Dose modification: 65 mg/m2 (original dose 75 mg/m2, Cycle 5, Reason: Dose not tolerated). Administration: 140 mg (02/13/2019), 140 mg (03/04/2019), 120 mg (05/07/2019), 120 mg (05/28/2019), 140 mg (03/26/2019), 140 mg (04/18/2019)  pertuzumab (PERJETA) 420 mg in sodium chloride 0.9 % 250 mL chemo infusion, 420 mg (100 % of original dose 420 mg), Intravenous, Once, 3 of 3 cycles. Dose modification: 420 mg (original dose 420 mg, Cycle 1, Reason: Provider Judgment). Administration: 420 mg (02/13/2019), 420 mg (03/04/2019), 420 mg (03/26/2019)  fosaprepitant (EMEND) 150 mg  dexamethasone (DECADRON) 12 mg in sodium chloride 0.9 % 145 mL IVPB, , Intravenous,  Once,  6 of 6 cycles. Administration:  (02/13/2019),  (03/04/2019),  (05/07/2019),  (05/28/2019),  (03/26/2019),   (04/18/2019)  trastuzumab-dkst (OGIVRI) 651 mg in sodium chloride 0.9 % 250 mL chemo infusion, 8 mg/kg = 651 mg, Intravenous,  Once, 6 of 6 cycles. Administration: 651 mg (02/13/2019), 483 mg (03/04/2019), 483 mg (05/07/2019), 483 mg (05/28/2019), 483 mg (03/26/2019), 483 mg (04/18/2019).   06/13/2019 Surgery   Left lumpectomy ([facility]) [phone]): no residual carcinoma, 2/3 left axillary lymph nodes positive for carcinoma. Lymphovascular space invasion was present.   06/20/2019 Cancer Staging   Staging form: Breast, AJCC 8th Edition - Pathologic stage from 06/20/2019: No Stage Recommended (ypT0, pN1, cM0, ER+, PR-, HER2-)   07/11/2019 Surgery   Axillary lymph node dissection ([facility]) ([id number]): 3/3 lymph nodes positive for cancer   08/19/2019 - 10/07/2019 Radiation Therapy   The patient initially received a dose of 50.4 Gy in 28 fractions to the breast and SCLV region using a 4-field approach. This was delivered using a 3-D conformal technique. The patient then received a boost to the seroma. This delivered an additional 10 Gy in 5 fractions using a 3-field photon boost technique. The total dose was 60.4 Gy.   09/2019 - 09/2029 Anti-estrogen oral therapy   Tamoxifen   04/15/2021 -  Chemotherapy   Patient is on Treatment Plan : BREAST Paclitaxel-Albumin (Abraxane) (100) D1,8,15 + Pembrolizumab (200) every 3 weeks, q28d       CHIEF COMPLIANT: Follow-up to discuss the result of her recent breast imaging for biopsies  INTERVAL HISTORY: ZEDA GANGWER is a 46 year old above-mentioned history of recurrent breast cancer with findings in the breast and the lymph node as well as liver lesions.  She underwent a biopsy of the breast and we are connected through telephone to discuss results.  She was having lots of abdominal pain from the liver metastases but it has improved with oxycodone. She is interested in pursuing systemic treatment options for her metastatic breast cancer.   ALLERGIES:  is  allergic to latex, other, and adhesive [tape].  MEDICATIONS:  Current Outpatient Medications  Medication Sig Dispense Refill   dexamethasone (DECADRON) 4 MG tablet Take 1 tablet (4 mg total) by mouth 2 (two) times daily with a meal. 60 tablet 1   fentaNYL (DURAGESIC) 25 MCG/HR Place 1 patch onto the skin every 3 (three) days. 10 patch 0   lidocaine-prilocaine (EMLA) cream Apply to affected area once 30 g 3   ondansetron (ZOFRAN) 8 MG tablet Take 1 tablet (8 mg total) by mouth 2 (two) times daily as needed (Nausea or vomiting). 30 tablet 1   Oxycodone HCl 10 MG TABS Take 1 tablet (10 mg total) by mouth 4 (four) times daily as needed. 60 tablet 0   prochlorperazine (COMPAZINE) 10 MG tablet Take 1 tablet (10 mg total) by mouth every 6 (six) hours as needed (Nausea or vomiting). 30 tablet 1   No current facility-administered medications for this visit.    PHYSICAL EXAMINATION: ECOG PERFORMANCE STATUS: 2 - Symptomatic, <50% confined to bed  There were no vitals filed for this visit. There were no vitals filed for this visit.    LABORATORY DATA:  I have reviewed the data as listed CMP Latest Ref Rng & Units 03/26/2021 03/22/2021 01/25/2021  Glucose 70 - 99 mg/dL 88 142(H) 96  BUN 6 - 20 mg/dL 5(L) 9 10  Creatinine 0.44 - 1.00 mg/dL 0.61 0.68 0.90  Sodium 135 - 145 mmol/L 129(L) 130(L) 137  Potassium 3.5 - 5.1  mmol/L 3.7 3.3(L) 3.5  Chloride 98 - 111 mmol/L 92(L) 92(L) 100  CO2 22 - 32 mmol/L 26 27 27   Calcium 8.9 - 10.3 mg/dL 9.2 9.3 9.8  Total Protein 6.5 - 8.1 g/dL 8.0 8.0 9.0(H)  Total Bilirubin 0.3 - 1.2 mg/dL 4.3(HH) 0.6 0.6  Alkaline Phos 38 - 126 U/L 249(H) 97 81  AST 15 - 41 U/L 284(HH) 78(H) 29  ALT 0 - 44 U/L 308(HH) 59(H) 30    Lab Results  Component Value Date   WBC 6.1 03/26/2021   HGB 12.0 03/26/2021   HCT 34.4 (L) 03/26/2021   MCV 85.6 03/26/2021   PLT 266 03/26/2021   NEUTROABS 4.2 03/26/2021    ASSESSMENT & PLAN:  Malignant neoplasm of upper-outer  quadrant of left breast in female, estrogen receptor positive ([place]) 01/18/2019: Patient palpated a left breast lump for 2 weeks. Mammogram showed a 2.3cm left breast mass at the 12 o'clock position, with 1 enlarged left axillary lymph node measuring 3.5cm. 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  ER positive, PR negative, HER-2 positive (initial IHC 3+) accidentally FISH was done which was negative. T2N1 stage IIa clinical stage   Treatment plan: 1. Neoadjuvant chemotherapy with TCH Perjeta 6 cycles (maintenance anti-HER-2 therapy is not being given because the initial pathology was reread as HER-2 negative.  The final pathology on the lumpectomy was also HER-2 negative) ER 60% week, PR negative 2. 06/13/2019 left lumpectomy ([facility]): no residual carcinoma, 2/3 left axillary lymph nodes positive for carcinoma. 07/15/2019: ALN D: 3/3 lymph nodes positive.  No extranodal extension 3. Followed by adjuvant radiation therapy started 08/20/2019 No adverse effects from participation in the neuropathy clinical trial SWOG 1714 CT Chest 01/25/21: No PE, Lymphadenopathy in mediastinum and hilar regions, Mass vs LN in axillary tain METASTATIC progression: 03/23/2021: Widespread metastatic disease including the liver thoracoabdominal nodes and left chest wall/breast.  Gallbladder wall thickening, small right pleural effusion. 03/25/2021: Diagnostic mammogram showing several left breast masses concerning for malignancy the largest being 4.6 cm.  --------------------------------------------------------------------------------------------------------------------------------------------------------------------------------------------------------------------------------------  1.  Metastatic breast cancer: Biopsy of the breast and the axillary lymph node revealed that this was a triple negative metastatic breast cancer.  HER2 was 1+ by IHC, Ki-67 70%: I recommended systemic therapy with  Abraxane and pembrolizumab. [facility] molecular testing is pending. I discussed the pros and cons of pembrolizumab including the immune mediated adverse effects.  2.  Cancer related pain: 10 mg of oxycodone.  Also sent a prescription for fentanyl patch  3.  Decreased appetite: Appetite is much improved with dexamethasone   Patient is agreeable for treatment.  We will request for port placement and chemo class. Plan to start chemotherapy as soon as a port is placed.   Orders Placed This Encounter  Procedures   CBC with Differential ([place] Only)    Standing Status:   Standing    Number of Occurrences:   20    Standing Expiration Date:   04/07/2022   CMP ([place] only)    Standing Status:   Standing    Number of Occurrences:   20    Standing Expiration Date:   04/07/2022   T4    Standing Status:   Standing    Number of Occurrences:   20    Standing Expiration Date:   04/07/2022   TSH    Standing Status:   Standing    Number of Occurrences:   20    Standing Expiration  Date:   04/07/2022   [facility] PHYSICIAN COMMUNICATION 1    NOTE: Abraxane is given D1,8,15 q28d, Pembrolizumab is given q21d: Cycle 1 & 4 Days 1,22, Cycle 2 & 5 Day 15, Cycle 3 & 6 Day 8   [facility] PHYSICIAN COMMUNICATION 2    In the Cortes et al study (KEYNOTE-355), pembrolizumab could continue for up to 2 years; chemotherapy could continue at the discretion of the investigator.   ON[facility] PHYSICIAN COMMUNICATION 3    Thyroid function tests at baseline and every 3rd cycle.   The patient has a good understanding of the overall plan. she agrees with it. she will call with any problems that may develop before the next visit here. Total time spent: 40 mins including face to face time and time spent for planning, charting and co-ordination of care   Harriette Ohara, MD 04/07/21

## 2021-04-08 ENCOUNTER — Other Ambulatory Visit: Payer: Self-pay | Admitting: *Deleted

## 2021-04-08 ENCOUNTER — Telehealth: Payer: Self-pay | Admitting: Hematology and Oncology

## 2021-04-08 ENCOUNTER — Telehealth: Payer: Self-pay | Admitting: *Deleted

## 2021-04-08 ENCOUNTER — Encounter: Payer: Self-pay | Admitting: *Deleted

## 2021-04-08 DIAGNOSIS — C50412 Malignant neoplasm of upper-outer quadrant of left female breast: Secondary | ICD-10-CM

## 2021-04-08 DIAGNOSIS — Z170 Estrogen receptor positive status [ER+]: Secondary | ICD-10-CM

## 2021-04-08 NOTE — Progress Notes (Signed)
Successfully faxed Car[facility] to [phone]

## 2021-04-08 NOTE — Telephone Encounter (Signed)
Left vm to assess needs or questions regarding tx care plan. Contact information provided for questions or needs.

## 2021-04-08 NOTE — Telephone Encounter (Signed)
Sch per 1/10 inbasket, left msg

## 2021-04-08 NOTE — Progress Notes (Signed)
Pharmacist Chemotherapy Monitoring - Initial Assessment    Anticipated start date: 04/15/21   The following has been reviewed per standard work regarding the patient's treatment regimen: The patient's diagnosis, treatment plan and drug doses, and organ/hematologic function Lab orders and baseline tests specific to treatment regimen  The treatment plan start date, drug sequencing, and pre-medications Prior authorization status  Patient's documented medication list, including drug-drug interaction screen and prescriptions for anti-emetics and supportive care specific to the treatment regimen The drug concentrations, fluid compatibility, administration routes, and timing of the medications to be used The patient's access for treatment and lifetime cumulative dose history, if applicable  The patient's medication allergies and previous infusion related reactions, if applicable   Changes made to treatment plan:  N/A  Follow up needed:  Pending authorization for treatment    Larene Beach, [place], 04/08/2021  3:08 PM

## 2021-04-09 ENCOUNTER — Encounter (HOSPITAL_BASED_OUTPATIENT_CLINIC_OR_DEPARTMENT_OTHER): Payer: Self-pay | Admitting: Surgery

## 2021-04-09 ENCOUNTER — Other Ambulatory Visit: Payer: Self-pay

## 2021-04-12 ENCOUNTER — Encounter: Payer: Self-pay | Admitting: *Deleted

## 2021-04-12 ENCOUNTER — Inpatient Hospital Stay (HOSPITAL_BASED_OUTPATIENT_CLINIC_OR_DEPARTMENT_OTHER): Payer: BC Managed Care – PPO | Admitting: Nurse Practitioner

## 2021-04-12 DIAGNOSIS — R634 Abnormal weight loss: Secondary | ICD-10-CM

## 2021-04-12 DIAGNOSIS — G893 Neoplasm related pain (acute) (chronic): Secondary | ICD-10-CM | POA: Diagnosis not present

## 2021-04-12 DIAGNOSIS — K5903 Drug induced constipation: Secondary | ICD-10-CM

## 2021-04-12 DIAGNOSIS — Z170 Estrogen receptor positive status [ER+]: Secondary | ICD-10-CM

## 2021-04-12 DIAGNOSIS — R531 Weakness: Secondary | ICD-10-CM | POA: Diagnosis not present

## 2021-04-12 DIAGNOSIS — Z515 Encounter for palliative care: Secondary | ICD-10-CM

## 2021-04-12 DIAGNOSIS — C50412 Malignant neoplasm of upper-outer quadrant of left female breast: Secondary | ICD-10-CM

## 2021-04-12 NOTE — Progress Notes (Signed)
CHG soap given to patient's friend with written instructions and surgery information sheet.

## 2021-04-12 NOTE — Progress Notes (Signed)
[facility]  [phone] Fax:([phone]) [phone]   Name: Paige Perry Date: 04/12/2021 MRN: [id number]  DOB: 1975/11/07  Patient Care Team: [name], NP as PCP - General Mauro Kaufmann, RN as Oncology Nurse Navigator Rockwell Germany, RN as Oncology Nurse Navigator Donnie Mesa, MD as Consulting Physician (General Surgery) Nicholas Lose, MD as Consulting Physician (Hematology and Oncology) Kyung Rudd, MD as Consulting Physician (Radiation Oncology)   I connected with Altamease Oiler on 04/12/21 at  1:30 PM EST by My Chart video visit and verified that I am speaking with the correct person using two identifiers.   I discussed the limitations, risks, security and privacy concerns of performing an evaluation and management service by telemedicine and the availability of in-person appointments. I also discussed with the patient that there may be a patient responsible charge related to this service. The patient expressed understanding and agreed to proceed.   Other persons participating in the visit and their role in the encounter: N/A   Patients location: Home   Providers location: [place] HISTORY: Paige Perry is a 46 y.o. female with metastatic breast cancer (ER positive) now with widespread disease.  Palliative ask to see for symptom management and goals of care.   SOCIAL HISTORY:     reports that she has never smoked. She has never used smokeless tobacco. She reports current alcohol use. She reports that she does not use drugs.  ADVANCE DIRECTIVES:  None on file.  Patient provided with advanced directive packet.  Detailed education provided.  She would like to complete documents in the future.  She was given advanced directive clinic dates with plans on scheduling for February 13.  CODE STATUS:   PAST MEDICAL HISTORY: Past Medical History:  Diagnosis Date   Asthma    Cancer ([place])    breast    Family history of breast cancer    Family history of breast cancer    Family history of prostate cancer    Family history of throat cancer    Hypercholesteremia    PONV (postoperative nausea and vomiting)     ALLERGIES:  is allergic to latex, other, and adhesive [tape].  MEDICATIONS:  Current Outpatient Medications  Medication Sig Dispense Refill   dexamethasone (DECADRON) 4 MG tablet Take 1 tablet (4 mg total) by mouth 2 (two) times daily with a meal. 60 tablet 1   fentaNYL (DURAGESIC) 25 MCG/HR Place 1 patch onto the skin every 3 (three) days. 10 patch 0   lidocaine-prilocaine (EMLA) cream Apply to affected area once 30 g 3   ondansetron (ZOFRAN) 8 MG tablet Take 1 tablet (8 mg total) by mouth 2 (two) times daily as needed (Nausea or vomiting). 30 tablet 1   Oxycodone HCl 10 MG TABS Take 1 tablet (10 mg total) by mouth 4 (four) times daily as needed. 60 tablet 0   prochlorperazine (COMPAZINE) 10 MG tablet Take 1 tablet (10 mg total) by mouth every 6 (six) hours as needed (Nausea or vomiting). 30 tablet 1   No current facility-administered medications for this visit.    VITAL SIGNS: There were no vitals taken for this visit. There were no vitals filed for this visit.  Estimated body mass index is 29.41 kg/m as calculated from the following:   Height as of 04/09/21: 5\' 3"  (1.6 m).   Weight as of 04/09/21: 166 lb (75.3 kg).   PERFORMANCE STATUS (ECOG) :  1 - Symptomatic but completely ambulatory   Physical Exam General: NAD Neurological: Weakness, AAO x3  IMPRESSION:  Paige Perry is present on virtual video visit. She is lying on her couch for comfort. Reports she is able to sit up some however, not for long periods due to discomfort. No acute distress noted.  Does endorse ongoing weakness.  Utilizes walker occasionally in the home.  States that it is difficult to get up off the toilet and is requesting possible assistance with bedside commode/toilet seat lifting to aid in better  support.  Pain Paige Perry reports her pain seems to be managed effectively.  Is appreciative of fentanyl patch and the relief that she has obtained since starting.  She is continuing to use oxycodone as needed for breakthrough pain.  Reports she only requires the use 1-2 times daily depending on her activity level.  He is unable to states or stand for long periods of time as this escalates her pain and discomfort.  Constipation Constipation is improving.  Taking MiraLAX daily with understanding if no bowel movement can increase to twice daily.  Decreased appetite She reports her appetite has improved.  She made decisions to no longer continue taking the dexamethasone as she did not like the way it made her feel over time.  However does feel like she can gain some benefit and is eating at least twice a day and between snacks.   I discussed the importance of continued conversation with family and their medical providers regarding overall plan of care and treatment options, ensuring decisions are within the context of the patients values and GOCs.  PLAN: Continue fentanyl 25 mcg patch as previously prescribed.  Patient reporting pain is well controlled. Oxycodone 10 mg as needed for breakthrough pain Continue MiraLAX 1-2 times daily for bowel regimen Orders placed for bedside commode 21 discussions regarding nutrition.  Patient has discontinued use of dexamethasone however feels her appetite has improved.  She is able to tolerate 2 large meals per day with snacks in between. Plan to see patient back in collaboration with her other oncology appointments in 3-4 weeks.   Patient expressed understanding and was in agreement with this plan. She also understands that She can call the clinic at any time with any questions, concerns, or complaints.   Time Total: 35 min.   Visit consisted of counseling and education dealing with the complex and emotionally intense issues of symptom management and  palliative care in the setting of serious and potentially life-threatening illness.Greater than 50%  of this time was spent counseling and coordinating care related to the above assessment and plan.  Signed by: Alda Lea, AGPCNP-BC [place]

## 2021-04-14 ENCOUNTER — Ambulatory Visit (HOSPITAL_BASED_OUTPATIENT_CLINIC_OR_DEPARTMENT_OTHER): Payer: BC Managed Care – PPO | Admitting: Certified Registered"

## 2021-04-14 ENCOUNTER — Ambulatory Visit (HOSPITAL_BASED_OUTPATIENT_CLINIC_OR_DEPARTMENT_OTHER)
Admission: RE | Admit: 2021-04-14 | Discharge: 2021-04-14 | Disposition: A | Discharge status: Home / Self Care | Payer: BC Managed Care – PPO | Attending: Surgery | Admitting: Surgery

## 2021-04-14 ENCOUNTER — Other Ambulatory Visit: Payer: Self-pay | Admitting: Hematology and Oncology

## 2021-04-14 ENCOUNTER — Encounter (HOSPITAL_BASED_OUTPATIENT_CLINIC_OR_DEPARTMENT_OTHER): Payer: Self-pay | Admitting: Surgery

## 2021-04-14 ENCOUNTER — Ambulatory Visit (HOSPITAL_COMMUNITY): Payer: BC Managed Care – PPO

## 2021-04-14 ENCOUNTER — Encounter (HOSPITAL_BASED_OUTPATIENT_CLINIC_OR_DEPARTMENT_OTHER)
Admission: RE | Disposition: A | Discharge status: Home / Self Care | Payer: Self-pay | Source: Home / Self Care | Attending: Surgery

## 2021-04-14 ENCOUNTER — Other Ambulatory Visit: Payer: Self-pay

## 2021-04-14 DIAGNOSIS — Z452 Encounter for adjustment and management of vascular access device: Secondary | ICD-10-CM | POA: Diagnosis present

## 2021-04-14 DIAGNOSIS — J45909 Unspecified asthma, uncomplicated: Secondary | ICD-10-CM | POA: Insufficient documentation

## 2021-04-14 DIAGNOSIS — C50912 Malignant neoplasm of unspecified site of left female breast: Secondary | ICD-10-CM | POA: Insufficient documentation

## 2021-04-14 HISTORY — PX: PORTACATH PLACEMENT: SHX2246

## 2021-04-14 IMAGING — CR DG CHEST 1V PORT
1 series · 1 of 1 positions shown · non-contrast
Comparison: [DATE]

CLINICAL DATA: Post port placement in PACU.

EXAM:
PORTABLE CHEST 1 VIEW

[chest ap]
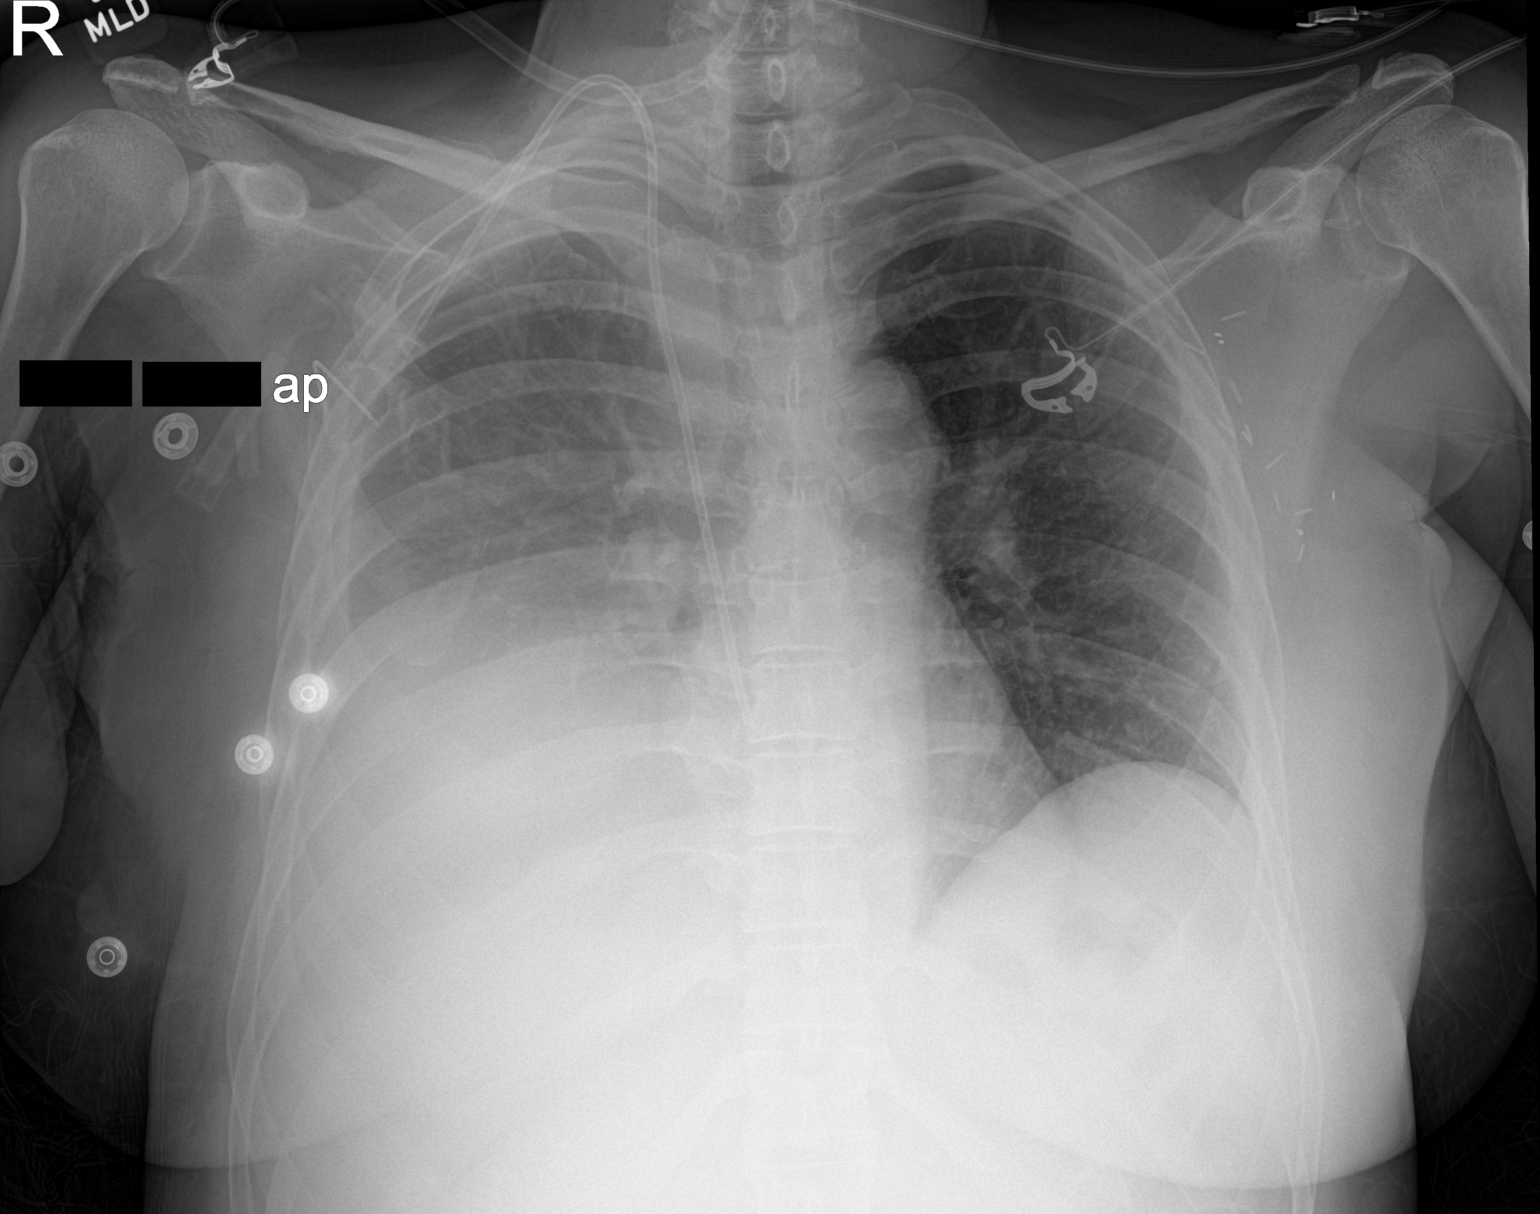

[1 of 1 positions shown; findings below may reference images not displayed]

FINDINGS: Accessed right chest wall port with tip overlying the superior
cavoatrial junction. No visible pneumothorax. Large right pleural
effusion with adjacent compressive atelectasis. Left lung is clear.
The heart size and mediastinal contours are within normal limits.
Left axillary surgical clips.
IMPRESSION: 1. Large right pleural effusion with adjacent compressive
atelectasis.
2. No visible pneumothorax status post port placement.

## 2021-04-14 SURGERY — INSERTION, TUNNELED CENTRAL VENOUS DEVICE, WITH PORT
Anesthesia: General | Site: Chest | Laterality: Right

## 2021-04-14 MED ORDER — ONDANSETRON HCL 4 MG/2ML IJ SOLN
INTRAMUSCULAR | Status: DC | PRN
Start: 2021-04-14 — End: 2021-04-14
  Administered 2021-04-14: 4 mg via INTRAVENOUS

## 2021-04-14 MED ORDER — HEPARIN SOD (PORK) LOCK FLUSH 100 UNIT/ML IV SOLN
INTRAVENOUS | Status: DC | PRN
  Administered 2021-04-14: 400 [IU]

## 2021-04-14 MED ORDER — PHENYLEPHRINE 40 MCG/ML (10ML) SYRINGE FOR IV PUSH (FOR BLOOD PRESSURE SUPPORT)
PREFILLED_SYRINGE | INTRAVENOUS | Status: AC
  Filled 2021-04-14: qty 20

## 2021-04-14 MED ORDER — BUPIVACAINE-EPINEPHRINE (PF) 0.25% -1:200000 IJ SOLN
INTRAMUSCULAR | Status: AC
  Filled 2021-04-14: qty 30

## 2021-04-14 MED ORDER — ONDANSETRON HCL 4 MG/2ML IJ SOLN
INTRAMUSCULAR | Status: AC
  Filled 2021-04-14: qty 8

## 2021-04-14 MED ORDER — FENTANYL CITRATE (PF) 100 MCG/2ML IJ SOLN
INTRAMUSCULAR | Status: AC
  Filled 2021-04-14: qty 2

## 2021-04-14 MED ORDER — FENTANYL CITRATE (PF) 100 MCG/2ML IJ SOLN: 25.0000 ug | INTRAMUSCULAR | Status: DC | PRN

## 2021-04-14 MED ORDER — HEPARIN SOD (PORK) LOCK FLUSH 100 UNIT/ML IV SOLN
INTRAVENOUS | Status: AC
  Filled 2021-04-14: qty 5

## 2021-04-14 MED ORDER — PROPOFOL 10 MG/ML IV BOLUS
INTRAVENOUS | Status: DC | PRN
  Administered 2021-04-14: 150 mg via INTRAVENOUS

## 2021-04-14 MED ORDER — PROPOFOL 500 MG/50ML IV EMUL
INTRAVENOUS | Status: DC | PRN
  Administered 2021-04-14: 100 ug/kg/min via INTRAVENOUS

## 2021-04-14 MED ORDER — SCOPOLAMINE 1 MG/3DAYS TD PT72
MEDICATED_PATCH | TRANSDERMAL | Status: DC | PRN
  Administered 2021-04-14: 1 via TRANSDERMAL

## 2021-04-14 MED ORDER — CHLORHEXIDINE GLUCONATE CLOTH 2 % EX PADS: 6.0000 | MEDICATED_PAD | Freq: Once | CUTANEOUS | Status: DC

## 2021-04-14 MED ORDER — DEXAMETHASONE SODIUM PHOSPHATE 10 MG/ML IJ SOLN
INTRAMUSCULAR | Status: AC
  Filled 2021-04-14: qty 1

## 2021-04-14 MED ORDER — ACETAMINOPHEN 10 MG/ML IV SOLN: 1000.0000 mg | Freq: Once | INTRAVENOUS | Status: DC | PRN

## 2021-04-14 MED ORDER — CEFAZOLIN SODIUM-DEXTROSE 2-4 GM/100ML-% IV SOLN
INTRAVENOUS | Status: AC
  Filled 2021-04-14: qty 100

## 2021-04-14 MED ORDER — HEPARIN (PORCINE) IN NACL 2-0.9 UNITS/ML
INTRAMUSCULAR | Status: AC | PRN
  Administered 2021-04-14: 1

## 2021-04-14 MED ORDER — MIDAZOLAM HCL 2 MG/2ML IJ SOLN
INTRAMUSCULAR | Status: AC
  Filled 2021-04-14: qty 2

## 2021-04-14 MED ORDER — PROMETHAZINE HCL 25 MG/ML IJ SOLN: 6.2500 mg | INTRAMUSCULAR | Status: DC | PRN

## 2021-04-14 MED ORDER — MIDAZOLAM HCL 5 MG/5ML IJ SOLN
INTRAMUSCULAR | Status: DC | PRN
  Administered 2021-04-14: 1 mg via INTRAVENOUS

## 2021-04-14 MED ORDER — PROPOFOL 500 MG/50ML IV EMUL
INTRAVENOUS | Status: AC
  Filled 2021-04-14: qty 100

## 2021-04-14 MED ORDER — SODIUM BICARBONATE 4.2 % IV SOLN
INTRAVENOUS | Status: AC
  Filled 2021-04-14: qty 10

## 2021-04-14 MED ORDER — PHENYLEPHRINE HCL (PRESSORS) 10 MG/ML IV SOLN
INTRAVENOUS | Status: DC | PRN
  Administered 2021-04-14 (×3): 80 ug via INTRAVENOUS

## 2021-04-14 MED ORDER — LACTATED RINGERS IV SOLN: INTRAVENOUS | Status: DC

## 2021-04-14 MED ORDER — LIDOCAINE 2% (20 MG/ML) 5 ML SYRINGE
INTRAMUSCULAR | Status: AC
  Filled 2021-04-14: qty 10

## 2021-04-14 MED ORDER — LIDOCAINE 2% (20 MG/ML) 5 ML SYRINGE
INTRAMUSCULAR | Status: DC | PRN
  Administered 2021-04-14: 60 mg via INTRAVENOUS

## 2021-04-14 MED ORDER — HEPARIN (PORCINE) IN NACL 1000-0.9 UT/500ML-% IV SOLN
INTRAVENOUS | Status: AC
  Filled 2021-04-14: qty 500

## 2021-04-14 MED ORDER — ACETAMINOPHEN 500 MG PO TABS
ORAL_TABLET | ORAL | Status: AC
  Filled 2021-04-14: qty 2

## 2021-04-14 MED ORDER — CEFAZOLIN SODIUM-DEXTROSE 2-4 GM/100ML-% IV SOLN
2.0000 g | INTRAVENOUS | Status: AC
  Administered 2021-04-14: 2 g via INTRAVENOUS

## 2021-04-14 MED ORDER — ACETAMINOPHEN 500 MG PO TABS: 1000.0000 mg | ORAL_TABLET | ORAL | Status: DC

## 2021-04-14 MED ORDER — FENTANYL CITRATE (PF) 100 MCG/2ML IJ SOLN
INTRAMUSCULAR | Status: DC | PRN
Start: 2021-04-14 — End: 2021-04-14
  Administered 2021-04-14: 50 ug via INTRAVENOUS
  Administered 2021-04-14: 25 ug via INTRAVENOUS

## 2021-04-14 MED ORDER — BUPIVACAINE-EPINEPHRINE 0.25% -1:200000 IJ SOLN
INTRAMUSCULAR | Status: DC | PRN
  Administered 2021-04-14: 10 mL

## 2021-04-14 SURGICAL SUPPLY — 39 items
APL PRP STRL LF DISP 70% ISPRP (MISCELLANEOUS) ×1
APL SKNCLS STERI-STRIP NONHPOA (GAUZE/BANDAGES/DRESSINGS) ×1
BAG DECANTER FOR FLEXI CONT (MISCELLANEOUS) ×2 IMPLANT
BENZOIN TINCTURE PRP APPL 2/3 (GAUZE/BANDAGES/DRESSINGS) ×2 IMPLANT
BLADE SURG 11 STRL SS (BLADE) ×2 IMPLANT
BLADE SURG 15 STRL LF DISP TIS (BLADE) ×1 IMPLANT
BLADE SURG 15 STRL SS (BLADE) ×2
CHLORAPREP W/TINT 26 (MISCELLANEOUS) ×2 IMPLANT
CLEANER CAUTERY TIP 5X5 PAD (MISCELLANEOUS) ×1 IMPLANT
COVER BACK TABLE 60X90IN (DRAPES) ×2 IMPLANT
COVER MAYO STAND STRL (DRAPES) ×2 IMPLANT
COVER PROBE 5X48 (MISCELLANEOUS) ×2
DRAPE C-ARM 42X72 X-RAY (DRAPES) ×2 IMPLANT
DRAPE LAPAROTOMY TRNSV 102X78 (DRAPES) ×2 IMPLANT
DRAPE UTILITY XL STRL (DRAPES) ×2 IMPLANT
DRSG TEGADERM 4X4.75 (GAUZE/BANDAGES/DRESSINGS) ×2 IMPLANT
ELECT REM PT RETURN 9FT ADLT (ELECTROSURGICAL) ×2
ELECTRODE REM PT RTRN 9FT ADLT (ELECTROSURGICAL) ×1 IMPLANT
GAUZE SPONGE 4X4 12PLY STRL LF (GAUZE/BANDAGES/DRESSINGS) ×1 IMPLANT
GLOVE SURG UNDER POLY LF SZ7.5 (GLOVE) ×2 IMPLANT
GOWN STRL REUS W/ TWL LRG LVL3 (GOWN DISPOSABLE) ×1 IMPLANT
GOWN STRL REUS W/TWL LRG LVL3 (GOWN DISPOSABLE) ×4
KIT CVR 48X5XPRB PLUP LF (MISCELLANEOUS) IMPLANT
KIT PORT POWER 8FR ISP CVUE (Port) ×1 IMPLANT
NDL HYPO 25X1 1.5 SAFETY (NEEDLE) ×1 IMPLANT
NEEDLE HYPO 25X1 1.5 SAFETY (NEEDLE) ×2 IMPLANT
PACK BASIN DAY SURGERY FS (CUSTOM PROCEDURE TRAY) ×2 IMPLANT
PAD CLEANER CAUTERY TIP 5X5 (MISCELLANEOUS) ×1
PENCIL SMOKE EVACUATOR (MISCELLANEOUS) ×2 IMPLANT
SLEEVE SCD COMPRESS KNEE MED (STOCKING) ×2 IMPLANT
SPONGE GAUZE 2X2 8PLY STRL LF (GAUZE/BANDAGES/DRESSINGS) ×2 IMPLANT
STRIP CLOSURE SKIN 1/2X4 (GAUZE/BANDAGES/DRESSINGS) ×2 IMPLANT
SUT MON AB 4-0 PC3 18 (SUTURE) ×2 IMPLANT
SUT PROLENE 2 0 CT2 30 (SUTURE) ×2 IMPLANT
SUT VIC AB 3-0 SH 27 (SUTURE) ×2
SUT VIC AB 3-0 SH 27X BRD (SUTURE) ×1 IMPLANT
SYR 5ML LUER SLIP (SYRINGE) ×2 IMPLANT
SYR CONTROL 10ML LL (SYRINGE) ×2 IMPLANT
TOWEL GREEN STERILE FF (TOWEL DISPOSABLE) ×2 IMPLANT

## 2021-04-14 NOTE — Anesthesia Procedure Notes (Signed)
Procedure Name: LMA Insertion Date/Time: 04/14/2021 8:37 AM Performed by: Signe Colt, CRNA Pre-anesthesia Checklist: Patient identified, Emergency Drugs available, Suction available and Patient being monitored Patient Re-evaluated:Patient Re-evaluated prior to induction Oxygen Delivery Method: Circle System Utilized Preoxygenation: Pre-oxygenation with 100% oxygen Induction Type: IV induction Ventilation: Mask ventilation without difficulty LMA: LMA inserted LMA Size: 4.0 Number of attempts: 1 Airway Equipment and Method: bite block Placement Confirmation: positive ETCO2 Tube secured with: Tape Dental Injury: Teeth and Oropharynx as per pre-operative assessment

## 2021-04-14 NOTE — Progress Notes (Addendum)
[facility] Follow up:    Paige Beals, NP [place] [address]   DIAGNOSIS:  Cancer Staging  Malignant neoplasm of upper-outer quadrant of left breast in female, estrogen receptor positive ([place]) Staging form: Breast, AJCC 8th Edition - Clinical stage from 01/23/2019: Stage IIA (cT2, cN1(f), cM0, G2, ER+, PR-, HER2+) - Signed by Nicholas Lose, MD on 01/23/2019 Stage prefix: Initial diagnosis Method of lymph node assessment: Core biopsy Histologic grading system: 3 grade system - Pathologic stage from 06/20/2019: No Stage Recommended (ypT0, pN1, cM0, ER+, PR-, HER2-) - Signed by Nicholas Lose, MD on 06/20/2019 Stage prefix: Post-therapy   SUMMARY OF ONCOLOGIC HISTORY: Oncology History  Malignant neoplasm of upper-outer quadrant of left breast in female, estrogen receptor positive ([place])  01/18/2019 Initial Diagnosis   Patient palpated a left breast lump for 2 weeks. Mammogram showed a 2.3cm left breast mass at the 12 o'clock position, with 1 enlarged left axillary lymph node measuring 3.5cm. 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   01/23/2019 Cancer Staging   Staging form: Breast, AJCC 8th Edition - Clinical stage from 01/23/2019: Stage IIA (cT2, cN1(f), cM0, G2, ER+, PR-, HER2+)   02/01/2019 Genetic Testing   Negative genetic testing. No pathogenic variants identified on the [facility] Common Hereditary Cancers Panel. The Common Hereditary Cancers Panel offered by [facility] includes sequencing and/or deletion duplication testing of the following 48 genes: APC, ATM, AXIN2, BARD1, BMPR1A, BRCA1, BRCA2, BRIP1, CDH1, CDKN2A (p14ARF), CDKN2A (p16INK4a), CKD4, CHEK2, CTNNA1, DICER1, EPCAM (Deletion/duplication testing only), GREM1 (promoter region deletion/duplication testing only), KIT, MEN1, MLH1, MSH2, MSH3, MSH6, MUTYH, NBN, NF1, NHTL1, PALB2, PDGFRA, PMS2, POLD1, POLE, PTEN, RAD50, RAD51C, RAD51D, RNF43, SDHB, SDHC,  SDHD, SMAD4, SMARCA4. STK11, TP53, TSC1, TSC2, and VHL.  The following genes were evaluated for sequence changes only: SDHA and HOXB13 c.251G>A variant only. The report date is 02/01/2019.    02/13/2019 - 05/28/2019 Chemotherapy   dexamethasone (DECADRON) 4 MG tablet, 4 mg (100 % of original dose 4 mg), Oral, Daily, 1 of 1 cycle, Start date: 01/23/2019, End date: 05/28/2019. Dose modification: 4 mg (original dose 4 mg, Cycle 0)  palonosetron (ALOXI) injection 0.25 mg, 0.25 mg, Intravenous,  Once, 6 of 6 cycles. Administration: 0.25 mg (02/13/2019), 0.25 mg (03/04/2019), 0.25 mg (05/07/2019), 0.25 mg (05/28/2019), 0.25 mg (03/26/2019), 0.25 mg (04/18/2019)  pegfilgrastim-jmdb (FULPHILA) injection 6 mg, 6 mg, Subcutaneous,  Once, 6 of 6 cycles. Administration: 6 mg (02/15/2019), 6 mg (03/06/2019), 6 mg (05/09/2019), 6 mg (05/30/2019), 6 mg (03/28/2019), 6 mg (04/20/2019)  CARBOplatin (PARAPLATIN) 700 mg in sodium chloride 0.9 % 250 mL chemo infusion, 700 mg (100 % of original dose 700 mg), Intravenous,  Once, 6 of 6 cycles. Dose modification: 700 mg (original dose 700 mg, Cycle 1). Administration: 700 mg (02/13/2019), 700 mg (03/04/2019), 700 mg (05/07/2019), 700 mg (05/28/2019), 700 mg (03/26/2019), 700 mg (04/18/2019)  DOCEtaxel (TAXOTERE) 140 mg in sodium chloride 0.9 % 250 mL chemo infusion, 75 mg/m2 = 140 mg, Intravenous,  Once, 6 of 6 cycles. Dose modification: 65 mg/m2 (original dose 75 mg/m2, Cycle 5, Reason: Dose not tolerated). Administration: 140 mg (02/13/2019), 140 mg (03/04/2019), 120 mg (05/07/2019), 120 mg (05/28/2019), 140 mg (03/26/2019), 140 mg (04/18/2019)  pertuzumab (PERJETA) 420 mg in sodium chloride 0.9 % 250 mL chemo infusion, 420 mg (100 % of original dose 420 mg), Intravenous, Once, 3 of 3 cycles. Dose modification: 420 mg (original dose 420 mg, Cycle 1, Reason: Provider  Judgment). Administration: 420 mg (02/13/2019), 420 mg (03/04/2019), 420 mg (03/26/2019)  fosaprepitant (EMEND) 150 mg  dexamethasone  (DECADRON) 12 mg in sodium chloride 0.9 % 145 mL IVPB, , Intravenous,  Once, 6 of 6 cycles. Administration:  (02/13/2019),  (03/04/2019),  (05/07/2019),  (05/28/2019),  (03/26/2019),  (04/18/2019)  trastuzumab-dkst (OGIVRI) 651 mg in sodium chloride 0.9 % 250 mL chemo infusion, 8 mg/kg = 651 mg, Intravenous,  Once, 6 of 6 cycles. Administration: 651 mg (02/13/2019), 483 mg (03/04/2019), 483 mg (05/07/2019), 483 mg (05/28/2019), 483 mg (03/26/2019), 483 mg (04/18/2019).   06/13/2019 Surgery   Left lumpectomy ([facility]) [phone]): no residual carcinoma, 2/3 left axillary lymph nodes positive for carcinoma. Lymphovascular space invasion was present.   06/20/2019 Cancer Staging   Staging form: Breast, AJCC 8th Edition - Pathologic stage from 06/20/2019: No Stage Recommended (ypT0, pN1, cM0, ER+, PR-, HER2-)   07/11/2019 Surgery   Axillary lymph node dissection ([facility]) ([id number]): 3/3 lymph nodes positive for cancer   08/19/2019 - 10/07/2019 Radiation Therapy   The patient initially received a dose of 50.4 Gy in 28 fractions to the breast and SCLV region using a 4-field approach. This was delivered using a 3-D conformal technique. The patient then received a boost to the seroma. This delivered an additional 10 Gy in 5 fractions using a 3-field photon boost technique. The total dose was 60.4 Gy.   09/2019 - 09/2029 Anti-estrogen oral therapy   Tamoxifen   04/15/2021 -  Chemotherapy   Patient is on Treatment Plan : BREAST Paclitaxel-Albumin (Abraxane) (100) D1,8,15 + Pembrolizumab (200) every 3 weeks, q28d       CURRENT THERAPY: Abraxane, Pembrolizumab  INTERVAL HISTORY: Paige Perry 46 y.o. female returns for evaluation prior to initiating treatment for her metastatic triple negative breast cancer with Abraxane and pembrolizumab.  She notes that her pain is improved with fentanyl patch.  She does have some pain in her stomach.  She also experiences pain in her knees and back.  Paige Perry notes that she has  a very decreased appetite.  She wants to know what she can do to improve this.  Patient Active Problem List   Diagnosis Date Noted   Port-A-Cath in place 03/26/2019   Genetic testing 02/01/2019   Family history of prostate cancer    Family history of throat cancer    Family history of breast cancer    Malignant neoplasm of upper-outer quadrant of left breast in female, estrogen receptor positive ([place]) 01/18/2019   Other allergic rhinitis 12/03/2014    is allergic to latex, other, adhesive [tape], and tapentadol.  MEDICAL HISTORY: Past Medical History:  Diagnosis Date   Asthma    Cancer ([place])    breast   Family history of breast cancer    Family history of breast cancer    Family history of prostate cancer    Family history of throat cancer    Hypercholesteremia    PONV (postoperative nausea and vomiting)     SURGICAL HISTORY: Past Surgical History:  Procedure Laterality Date   AXILLARY LYMPH NODE DISSECTION Left 07/11/2019   Procedure: LEFT AXILLARY LYMPH NODE DISSECTION;  Surgeon: Donnie Mesa, MD;  Location: [facility];  Service: General;  Laterality: Left;   BREAST LUMPECTOMY WITH RADIOACTIVE SEED AND AXILLARY LYMPH NODE DISSECTION Left 06/13/2019   Procedure: LEFT BREAST LUMPECTOMY WITH RADIOACTIVE SEED AND AXILLARY TARGETED LYMPH NODE DISSECTION;  Surgeon: Donnie Mesa, MD;  Location: [facility];  Service: General;  Laterality: Left;  FINGER SURGERY     PORTACATH PLACEMENT Right 02/05/2019   Procedure: INSERTION PORT-A-CATH WITH ULTRASOUND;  Surgeon: Donnie Mesa, MD;  Location: [place] [place];  Service: General;  Laterality: Right;   PORTACATH PLACEMENT Right 04/14/2021   Procedure: INSERTION PORT-A-CATH;  Surgeon: Donnie Mesa, MD;  Location: [facility];  Service: General;  Laterality: Right;   WISDOM TOOTH EXTRACTION     WOUND EXPLORATION Right 06/13/2019   Procedure: RIGHT CHEST WOUND EXPLORATION;   Surgeon: Donnie Mesa, MD;  Location: [facility];  Service: General;  Laterality: Right;    SOCIAL HISTORY: Social History   Socioeconomic History   Marital status: Single    Spouse name: Not on file   Number of children: Not on file   Years of education: Not on file   Highest education level: Not on file  Occupational History   Not on file  Tobacco Use   Smoking status: Never   Smokeless tobacco: Never  Vaping Use   Vaping Use: Never used  Substance and Sexual Activity   Alcohol use: Yes    Comment: occasionally   Drug use: No   Sexual activity: Yes    Birth control/protection: None  Other Topics Concern   Not on file  Social History Narrative   Not on file   Social Determinants of Health   Financial Resource Strain: Not on file  Food Insecurity: Not on file  Transportation Needs: Not on file  Physical Activity: Not on file  Stress: Not on file  Social Connections: Not on file  Intimate Partner Violence: Not on file    FAMILY HISTORY: Family History  Problem Relation Age of Onset   Hypertension Mother    Heart failure Mother    Breast cancer Maternal Aunt        dx 41s   Breast cancer Paternal Aunt    Prostate cancer Father 77   Throat cancer Cousin     Review of Systems  Constitutional:  Positive for appetite change and fatigue. Negative for chills, fever and unexpected weight change.  HENT:   Negative for hearing loss, lump/mass and trouble swallowing.   Eyes:  Negative for eye problems and icterus.  Respiratory:  Negative for chest tightness, cough and shortness of breath.   Cardiovascular:  Negative for chest pain, leg swelling and palpitations.  Gastrointestinal:  Negative for abdominal distention, abdominal pain, constipation, diarrhea, nausea and vomiting.  Endocrine: Negative for hot flashes.  Genitourinary:  Negative for difficulty urinating.   Musculoskeletal:  Positive for back pain. Negative for arthralgias.  Skin:  Negative  for itching and rash.  Neurological:  Negative for dizziness, extremity weakness, headaches and numbness.  Hematological:  Negative for adenopathy. Does not bruise/bleed easily.  Psychiatric/Behavioral:  Negative for depression. The patient is not nervous/anxious.      PHYSICAL EXAMINATION  ECOG PERFORMANCE STATUS: 3 - Symptomatic, >50% confined to bed  Vitals:   04/15/21 0826  BP: 99/74  Pulse: (!) 125  Resp: 18  Temp: (!) 97.5 F (36.4 C)  SpO2: 99%    Physical Exam Constitutional:      Appearance: She is ill-appearing.  HENT:     Head: Normocephalic and atraumatic.     Mouth/Throat:     Mouth: Mucous membranes are dry.     Pharynx: Oropharyngeal exudate (+ thrush) present.  Eyes:     General: Scleral icterus present.  Cardiovascular:     Rate and Rhythm: Tachycardia present.  Pulmonary:  Effort: Pulmonary effort is normal.     Breath sounds: Normal breath sounds.  Abdominal:     General: There is distension.     Tenderness: There is abdominal tenderness.  Musculoskeletal:     Cervical back: Neck supple.  Lymphadenopathy:     Cervical: No cervical adenopathy.  Skin:    General: Skin is warm and dry.     Coloration: Skin is jaundiced.  Neurological:     General: No focal deficit present.    LABORATORY DATA:  CBC    Component Value Date/Time   WBC 16.0 (H) 04/15/2021 0758   WBC 5.8 03/22/2021 0955   RBC 3.34 (L) 04/15/2021 0758   HGB 9.6 (L) 04/15/2021 0758   HCT 27.5 (L) 04/15/2021 0758   PLT 269 04/15/2021 0758   MCV 82.3 04/15/2021 0758   MCH 28.7 04/15/2021 0758   MCHC 34.9 04/15/2021 0758   RDW 15.6 (H) 04/15/2021 0758   LYMPHSABS 0.9 04/15/2021 0758   MONOABS 1.0 04/15/2021 0758   EOSABS 0.0 04/15/2021 0758   BASOSABS 0.0 04/15/2021 0758    CMP     Component Value Date/Time   NA 119 (LL) 04/15/2021 0758   K 3.6 04/15/2021 0758   CL 84 (L) 04/15/2021 0758   CO2 27 04/15/2021 0758   GLUCOSE 101 (H) 04/15/2021 0758   GLUCOSE 87  03/31/2006 1359   BUN 9 04/15/2021 0758   CREATININE 0.53 04/15/2021 0758   CALCIUM 8.3 (L) 04/15/2021 0758   PROT 6.4 (L) 04/15/2021 0758   ALBUMIN 2.4 (L) 04/15/2021 0758   AST 262 (HH) 04/15/2021 0758   ALT 209 (H) 04/15/2021 0758   ALKPHOS 820 (H) 04/15/2021 0758   BILITOT 17.7 (HH) 04/15/2021 0758   GFRNONAA >60 04/15/2021 0758   GFRAA >60 05/28/2019 0845        ASSESSMENT and THERAPY PLAN:   Malignant neoplasm of upper-outer quadrant of left breast in female, estrogen receptor positive ([place]) 01/18/2019: Patient palpated a left breast lump for 2 weeks. Mammogram showed a 2.3cm left breast mass at the 12 o'clock position, with 1 enlarged left axillary lymph node measuring 3.5cm. 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  ER positive, PR negative, HER-2 positive (initial IHC 3+) accidentally FISH was done which was negative. T2N1 stage IIa clinical stage   Treatment plan: 1. Neoadjuvant chemotherapy with TCH Perjeta 6 cycles (maintenance anti-HER-2 therapy is not being given because the initial pathology was reread as HER-2 negative.  The final pathology on the lumpectomy was also HER-2 negative) ER 60% week, PR negative 2. 06/13/2019 left lumpectomy ([facility]): no residual carcinoma, 2/3 left axillary lymph nodes positive for carcinoma. 07/15/2019: ALN D: 3/3 lymph nodes positive.  No extranodal extension 3. Followed by adjuvant radiation therapy started 08/20/2019 No adverse effects from participation in the neuropathy clinical trial SWOG 1714 CT Chest 01/25/21: No PE, Lymphadenopathy in mediastinum and hilar regions, Mass vs LN in axillary tain METASTATIC progression: 03/23/2021: Widespread metastatic disease including the liver thoracoabdominal nodes and left chest wall/breast.  Gallbladder wall thickening, small right pleural effusion. 03/25/2021: Diagnostic mammogram showing several left breast masses concerning for malignancy the largest being 4.6  cm.  --------------------------------------------------------------------------------------------------------------------------------------------------------------------------------------------------------------------------------------  1.  Metastatic breast cancer: Patient with alert labs including T bili of 17.7.  She is increasingly jaundiced.  She met with Dr. Lindi Adie to review all of her elevated labs.  He discussed that if she does not proceed with treatment she will have shorten life expectancy,  however, if she does proceed with treatment it could also cause irreparable harm.  He noted that there may be a slight chance that she will improve with the treatment.  Understanding the risks Shamar would like to proceed with treatment.  Dr. Lindi Adie reviewed with her the sodium level of 119, the ALT of 820, the T bili of 17.7.  He notes that he is going to dose reduce and then she will be okay to treat with Abraxane and Keytruda.  2.  Cancer related pain: She is working with palliative care on this and it is controlled with the fentanyl patch and oxycodone.  She does not need any refills today.  3.  Thrush: Initially I was going to send in fluconazole, however her LFTs are elevated.  I will instead send in nystatin.  She met with Dr. Lindi Adie who reviewed the above in great detail.  She is in agreement with the plan.  We will see her back on Monday, January 23 for labs and follow-up.   No orders of the defined types were placed in this encounter.   All questions were answered. The patient knows to call the clinic with any problems, questions or concerns. We can certainly see the patient much sooner if necessary. This note was electronically signed. Scot Dock, NP 04/15/2021   Attending Note  I personally saw and examined Paige Perry. The plan of care was discussed with her. I agree with the physical exam findings and assessment and plan as documented above. I performed the majority of the  counseling and assessment and plan regarding this encounter Metastatic breast cancer with rapidly progressive liver metastases: Today's bilirubin is 17.7 with profound jaundice.  Her AST and ALT are also markedly elevated.  All of these are related to tumor infiltration of the liver. I discussed with the patient that her disease is rapidly progressing and if nothing is done she could die within a month. With treatment, she may also developed profound toxicities and could also end up in her death.  However there is a small chance that she may tolerated and might respond. Patient is fully aware of the tremendous risk this poses to her life and health and wants to give the treatment a chance to see if it would work.  Therefore we decided to give her 80 mg of Abraxane and pembrolizumab. We will see her back next Monday and Thursday to monitor her labs and to see how she is doing. She has already made all arrangements in case she does not survive this treatment. Signed Harriette Ohara, MD

## 2021-04-14 NOTE — H&P (Signed)
Paige Perry is an 46 y.o. female.   Chief Complaint: Breast cancer HPI: 46 year old female with a history of lumpectomy and axillary dissection for left breast cancer presents with recurrence and metastases.  She presents now for replacement of her port for chemotherapy.  Past Medical History:  Diagnosis Date   Asthma    Cancer ([place])    breast   Family history of breast cancer    Family history of breast cancer    Family history of prostate cancer    Family history of throat cancer    Hypercholesteremia    PONV (postoperative nausea and vomiting)     Past Surgical History:  Procedure Laterality Date   AXILLARY LYMPH NODE DISSECTION Left 07/11/2019   Procedure: LEFT AXILLARY LYMPH NODE DISSECTION;  Surgeon: Donnie Mesa, MD;  Location: [facility];  Service: General;  Laterality: Left;   BREAST LUMPECTOMY WITH RADIOACTIVE SEED AND AXILLARY LYMPH NODE DISSECTION Left 06/13/2019   Procedure: LEFT BREAST LUMPECTOMY WITH RADIOACTIVE SEED AND AXILLARY TARGETED LYMPH NODE DISSECTION;  Surgeon: Donnie Mesa, MD;  Location: [facility];  Service: General;  Laterality: Left;   FINGER SURGERY     PORTACATH PLACEMENT Right 02/05/2019   Procedure: INSERTION PORT-A-CATH WITH ULTRASOUND;  Surgeon: Donnie Mesa, MD;  Location: [facility];  Service: General;  Laterality: Right;   WISDOM TOOTH EXTRACTION     WOUND EXPLORATION Right 06/13/2019   Procedure: RIGHT CHEST WOUND EXPLORATION;  Surgeon: Donnie Mesa, MD;  Location: [facility];  Service: General;  Laterality: Right;    Family History  Problem Relation Age of Onset   Hypertension Mother    Heart failure Mother    Breast cancer Maternal Aunt        dx 12s   Breast cancer Paternal Aunt    Prostate cancer Father 49   Throat cancer Cousin    Social History:  reports that she has never smoked. She has never used smokeless tobacco. She reports current alcohol use. She  reports that she does not use drugs.  Allergies:  Allergies  Allergen Reactions   Latex    Other     Corn   Adhesive [Tape] Rash    Medications Prior to Admission  Medication Sig Dispense Refill   dexamethasone (DECADRON) 4 MG tablet Take 1 tablet (4 mg total) by mouth 2 (two) times daily with a meal. 60 tablet 1   fentaNYL (DURAGESIC) 25 MCG/HR Place 1 patch onto the skin every 3 (three) days. 10 patch 0   ondansetron (ZOFRAN) 8 MG tablet Take 1 tablet (8 mg total) by mouth 2 (two) times daily as needed (Nausea or vomiting). 30 tablet 1   Oxycodone HCl 10 MG TABS Take 1 tablet (10 mg total) by mouth 4 (four) times daily as needed. 60 tablet 0   lidocaine-prilocaine (EMLA) cream Apply to affected area once 30 g 3   prochlorperazine (COMPAZINE) 10 MG tablet Take 1 tablet (10 mg total) by mouth every 6 (six) hours as needed (Nausea or vomiting). 30 tablet 1      Assessment/Plan Recurrent metastatic breast cancer  Ultrasound-guided port placement.  The surgical procedure has been discussed with the patient.  Potential risks, benefits, alternative treatments, and expected outcomes have been explained.  All of the patient's questions at this time have been answered.  The likelihood of reaching the patient's treatment goal is good.  The patient understand the proposed surgical procedure and wishes to  proceed.   Maia Petties, MD 04/14/2021, 8:17 AM

## 2021-04-14 NOTE — Op Note (Signed)
Preop diagnosis: Metastatic breast cancer Postop diagnosis: Same Procedure performed: Ultrasound guided right internal jugular vein port placement Surgeon:Shlonda Dolloff K Marie Chow Anesthesia: General via LMA Indications:  46 year old female with a history of lumpectomy and axillary dissection for left breast cancer presents with recurrence and metastases.  She presents now for replacement of her port for chemotherapy.   Description of procedure: The patient is brought to the operating room placed in the supine position on the operating table.  After an adequate level of general anesthesia was obtained, the patient right arm was tucked at her side.  Her right chest and neck were prepped with ChloraPrep and draped sterile fashion.  A timeout was taken to ensure the proper patient and proper procedure.  She was placed in Trendelenburg position.  We interrogated her neck with the ultrasound.  The jugular vein is easily identified.  Using ultrasound guidance we directly cannulated the internal jugular vein with good blood return.  The wire passed easily.  Fluoroscopy confirmed that the wire headed down the right side of the mediastinum.  The needle was removed.  We created a subcutaneous pocket below the right clavicle at the site of her previous port.  We first anesthetized with local anesthetic.  We created a subcutaneous tunnel from the subcutaneous pocket to the insertion site on the neck.  An 8 French [place] port was assembled and was tunneled from the subcutaneous pocket to the insertion site.  The catheter was cut to the appropriate length using fluoroscopic guidance.  Using fluoroscopic guidance, we passed the dilator and breakaway sheath over the wire.  The wire and dilator were removed.  The catheter was then advanced through the sheath which was removed.  Fluoroscopy confirmed that there were no kinks along the length of the catheter.  We are able to aspirate blood easily through the port and were able to  flush easily.  The port was secured with two interrupted 2-0 Prolene sutures.  3-0 Vicryl was used to close the subcutaneous tissue and 4-0 Monocryl was used to close the skin at both sites.  Benzoin and Steri-Strips were applied.     The port was then accessed with good blood return and easy flush.  The tubing was clamped.  An occlusive dressing was placed.  The patient was then extubated and brought to the recovery room in stable condition.  All sponge, instrument, and needle counts are correct.  Post-op chest x-ray is pending.    Imogene Burn. Georgette Dover, MD, [facility]  General Surgery   04/14/2021 9:29 AM

## 2021-04-14 NOTE — Anesthesia Preprocedure Evaluation (Addendum)
Anesthesia Evaluation  Patient identified by MRN, date of birth, ID band Patient awake    Reviewed: Allergy & Precautions, NPO status , Patient's Chart, lab work & pertinent test results, reviewed documented beta blocker date and time   History of Anesthesia Complications (+) PONV  Airway Mallampati: II  TM Distance: >3 FB Neck ROM: Full    Dental no notable dental hx.    Pulmonary asthma ,    Pulmonary exam normal        Cardiovascular negative cardio ROS   Rhythm:Regular Rate:Normal     Neuro/Psych negative neurological ROS  negative psych ROS   GI/Hepatic negative GI ROS, Neg liver ROS,   Endo/Other  negative endocrine ROS  Renal/GU negative Renal ROS  negative genitourinary   Musculoskeletal Metastatic breast Ca   Abdominal Normal abdominal exam  (+)   Peds  Hematology negative hematology ROS (+)   Anesthesia Other Findings   Reproductive/Obstetrics                            Anesthesia Physical Anesthesia Plan  ASA: 2  Anesthesia Plan: General   Post-op Pain Management:    Induction: Intravenous  PONV Risk Score and Plan: 4 or greater and Ondansetron, Dexamethasone, Treatment may vary due to age or medical condition, TIVA and Propofol infusion  Airway Management Planned: Mask and LMA  Additional Equipment: None  Intra-op Plan:   Post-operative Plan: Extubation in OR  Informed Consent: I have reviewed the patients History and Physical, chart, labs and discussed the procedure including the risks, benefits and alternatives for the proposed anesthesia with the patient or authorized representative who has indicated his/her understanding and acceptance.     Dental advisory given  Plan Discussed with: CRNA  Anesthesia Plan Comments: (Lab Results      Component                Value               Date                      WBC                      6.1                  03/26/2021                HGB                      12.0                03/26/2021                HCT                      34.4 (L)            03/26/2021                MCV                      85.6                03/26/2021                PLT  266                 03/26/2021           Lab Results      Component                Value               Date                      NA                       129 (L)             03/26/2021                K                        3.7                 03/26/2021                CO2                      26                  03/26/2021                GLUCOSE                  88                  03/26/2021                BUN                      5 (L)               03/26/2021                CREATININE               0.61                03/26/2021                CALCIUM                  9.2                 03/26/2021                GFRNONAA                 >60                 03/26/2021          )      Anesthesia Quick Evaluation

## 2021-04-14 NOTE — Assessment & Plan Note (Addendum)
01/18/2019:Patient palpated a left breast lump for 2 weeks. Mammogram showed a 2.3cm left breast mass at the 12 o'clock position, with 1 enlarged left axillary lymph node measuring 3.5cm. 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 T2N1 stage IIa clinical stage  Treatment plan: 1. Neoadjuvant chemotherapy with TCH Perjeta 6 cycles (maintenance anti-HER-2 therapy is not being given because the initial pathology was reread as HER-2 negative.  The final pathology on the lumpectomy was also HER-2 negative) ER 60% week, PR negative 2. 06/13/2019 left lumpectomy ([facility]): no residual carcinoma, 2/3 left axillary lymph nodes positive for carcinoma. 07/15/2019: ALN D: 3/3 lymph nodes positive.  No extranodal extension 3. Followed by adjuvant radiation therapy started 08/20/2019 No adverse effects from participation in the neuropathy clinical trial SWOG 1714 CT Chest 01/25/21: No PE, Lymphadenopathy in mediastinum and hilar regions, Mass vs LN in axillary tain METASTATIC progression: 03/23/2021: Widespread metastatic disease including the liver thoracoabdominal nodes and left chest wall/breast.  Gallbladder wall thickening, small right pleural effusion. 03/25/2021: Diagnostic mammogram showing several left breast masses concerning for malignancy the largest being 4.6 cm.  --------------------------------------------------------------------------------------------------------------------------------------------------------------------------------------------------------------------------------------  1.  Metastatic breast cancer: Patient with alert labs including T bili of 17.7.  She is increasingly jaundiced.  She met with Dr. Lindi Adie to review all of her elevated labs.  He discussed that if she does not proceed with treatment she will have shorten life expectancy, however, if she  does proceed with treatment it could also cause irreparable harm.  He noted that there may be a slight chance that she will improve with the treatment.  Understanding the risks Ashle would like to proceed with treatment.  Dr. Lindi Adie reviewed with her the sodium level of 119, the ALT of 820, the T bili of 17.7.  He notes that he is going to dose reduce and then she will be okay to treat with Abraxane and Keytruda.  2.  Cancer related pain: She is working with palliative care on this and it is controlled with the fentanyl patch and oxycodone.  She does not need any refills today.  3.  Thrush: Initially I was going to send in fluconazole, however her LFTs are elevated.  I will instead send in nystatin.  She met with Dr. Lindi Adie who reviewed the above in great detail.  She is in agreement with the plan.  We will see her back on Monday, January 23 for labs and follow-up.

## 2021-04-14 NOTE — Discharge Instructions (Addendum)
PORT-A-CATH: POST OP INSTRUCTIONS  Always review your discharge instruction sheet given to you by the facility where your surgery was performed.   A prescription for pain medication may be given to you upon discharge. Take your pain medication as prescribed, if needed. If narcotic pain medicine is not needed, then you make take acetaminophen (Tylenol) or ibuprofen (Advil) as needed.  Take your usually prescribed medications unless otherwise directed. If you need a refill on your pain medication, please contact our office. All narcotic pain medicine now requires a paper prescription.  Phoned in and fax refills are no longer allowed by law.  Prescriptions will not be filled after 5 pm or on weekends.  You should follow a light diet for the remainder of the day after your procedure. Most patients will experience some mild swelling and/or bruising in the area of the incision. It may take several days to resolve. It is common to experience some constipation if taking pain medication after surgery. Increasing fluid intake and taking a stool softener (such as Colace) will usually help or prevent this problem from occurring. A mild laxative (Milk of Magnesia or Miralax) should be taken according to package directions if there are no bowel movements after 48 hours.  Unless discharge instructions indicate otherwise, you may remove your bandages 48 hours after surgery, and you may shower at that time. You may have steri-strips (small white skin tapes) in place directly over the incision.  These strips should be left on the skin for 7-10 days.   If your port is left accessed at the end of surgery (needle left in port), the dressing cannot get wet and should only by changed by a healthcare professional. When the port is no longer accessed (when the needle has been removed), follow step 7.   ACTIVITIES:  Limit activity involving your arms for the next 72 hours. Do no strenuous exercise or activity for 1 week.  You may drive when you are no longer taking prescription pain medication, you can comfortably wear a seatbelt, and you can maneuver your car. 10.You may need to see your doctor in the office for a follow-up appointment.  Please       check with your doctor.  11.When you receive a new Port-a-Cath, you will get a product guide and        ID card.  Please keep them in case you need them.  WHEN TO CALL YOUR DOCTOR [phone]): Fever over 101.0 Chills Continued bleeding from incision Increased redness and tenderness at the site Shortness of breath, difficulty breathing   The clinic staff is available to answer your questions during regular business hours. Please dont hesitate to call and ask to speak to one of the nurses or medical assistants for clinical concerns. If you have a medical emergency, go to the nearest emergency room or call 911.  A surgeon from [facility] is always on call at the hospital.       Post Anesthesia Home Care Instructions  Activity: Get plenty of rest for the remainder of the day. A responsible individual must stay with you for 24 hours following the procedure.  For the next 24 hours, DO NOT: -Drive a car -Paediatric nurse -Drink alcoholic beverages -Take any medication unless instructed by your physician -Make any legal decisions or sign important papers.  Meals: Start with liquid foods such as gelatin or soup. Progress to regular foods as tolerated. Avoid greasy, spicy, heavy foods. If nausea and/or vomiting  occur, drink only clear liquids until the nausea and/or vomiting subsides. Call your physician if vomiting continues.  Special Instructions/Symptoms: Your throat may feel dry or sore from the anesthesia or the breathing tube placed in your throat during surgery. If this causes discomfort, gargle with warm salt water. The discomfort should disappear within 24 hours.  If you had a scopolamine patch placed behind your ear for the  management of post- operative nausea and/or vomiting:  1. The medication in the patch is effective for 72 hours, after which it should be removed.  Wrap patch in a tissue and discard in the trash. Wash hands thoroughly with soap and water. 2. You may remove the patch earlier than 72 hours if you experience unpleasant side effects which may include dry mouth, dizziness or visual disturbances. 3. Avoid touching the patch. Wash your hands with soap and water after contact with the patch.

## 2021-04-14 NOTE — Transfer of Care (Signed)
Immediate Anesthesia Transfer of Care Note  Patient: Paige Perry  Procedure(s) Performed: INSERTION PORT-A-CATH (Right: Chest)  Patient Location: PACU  Anesthesia Type:General  Level of Consciousness: drowsy  Airway & Oxygen Therapy: Patient Spontanous Breathing and Patient connected to face mask oxygen  Post-op Assessment: Report given to RN and Post -op Vital signs reviewed and stable  Post vital signs: Reviewed and stable  Last Vitals:  Vitals Value Taken Time  BP    Temp    Pulse 106 04/14/21 0929  Resp    SpO2 100 % 04/14/21 0929  Vitals shown include unvalidated device data.  Last Pain:  Vitals:   04/14/21 0714  TempSrc: Oral  PainSc: 8          Complications: No notable events documented.

## 2021-04-14 NOTE — Anesthesia Postprocedure Evaluation (Signed)
Anesthesia Post Note  Patient: Paige Perry  Procedure(s) Performed: INSERTION PORT-A-CATH (Right: Chest)     Patient location during evaluation: PACU Anesthesia Type: General Level of consciousness: awake and alert Pain management: pain level controlled Vital Signs Assessment: post-procedure vital signs reviewed and stable Respiratory status: spontaneous breathing, nonlabored ventilation, respiratory function stable and patient connected to nasal cannula oxygen Cardiovascular status: blood pressure returned to baseline and stable Postop Assessment: no apparent nausea or vomiting Anesthetic complications: no   No notable events documented.  Last Vitals:  Vitals:   04/14/21 1024 04/14/21 1030  BP:  105/80  Pulse: (!) 114 (!) 110  Resp: 18 18  Temp:  (!) 36.4 C  SpO2: 98% 96%    Last Pain:  Vitals:   04/14/21 1030  TempSrc:   PainSc: 0-No pain                 Belenda Cruise P Dillon Mcreynolds

## 2021-04-15 ENCOUNTER — Encounter: Payer: Self-pay | Admitting: *Deleted

## 2021-04-15 ENCOUNTER — Other Ambulatory Visit: Payer: Self-pay | Admitting: Nurse Practitioner

## 2021-04-15 ENCOUNTER — Inpatient Hospital Stay: Payer: BC Managed Care – PPO

## 2021-04-15 ENCOUNTER — Other Ambulatory Visit: Payer: BC Managed Care – PPO

## 2021-04-15 ENCOUNTER — Encounter (HOSPITAL_BASED_OUTPATIENT_CLINIC_OR_DEPARTMENT_OTHER): Payer: Self-pay | Admitting: Surgery

## 2021-04-15 ENCOUNTER — Inpatient Hospital Stay (HOSPITAL_BASED_OUTPATIENT_CLINIC_OR_DEPARTMENT_OTHER): Payer: BC Managed Care – PPO | Admitting: Adult Health

## 2021-04-15 VITALS — BP 99/74 | HR 125 | Temp 97.5°F | Resp 18 | Ht 63.0 in | Wt 163.8 lb

## 2021-04-15 DIAGNOSIS — C50412 Malignant neoplasm of upper-outer quadrant of left female breast: Secondary | ICD-10-CM

## 2021-04-15 DIAGNOSIS — Z170 Estrogen receptor positive status [ER+]: Secondary | ICD-10-CM | POA: Diagnosis not present

## 2021-04-15 DIAGNOSIS — Z5111 Encounter for antineoplastic chemotherapy: Secondary | ICD-10-CM | POA: Diagnosis not present

## 2021-04-15 DIAGNOSIS — Z95828 Presence of other vascular implants and grafts: Secondary | ICD-10-CM

## 2021-04-15 LAB — CMP (CANCER CENTER ONLY)
ALT: 209 U/L — ABNORMAL HIGH (ref 0–44)
AST: 262 U/L (ref 15–41)
Albumin: 2.4 g/dL — ABNORMAL LOW (ref 3.5–5.0)
Alkaline Phosphatase: 820 U/L — ABNORMAL HIGH (ref 38–126)
Anion gap: 8 (ref 5–15)
BUN: 9 mg/dL (ref 6–20)
CO2: 27 mmol/L (ref 22–32)
Calcium: 8.3 mg/dL — ABNORMAL LOW (ref 8.9–10.3)
Chloride: 84 mmol/L — ABNORMAL LOW (ref 98–111)
Creatinine: 0.53 mg/dL (ref 0.44–1.00)
GFR, Estimated: >60 mL/min (ref >60)
Glucose, Bld: 101 mg/dL — ABNORMAL HIGH (ref 70–99)
Potassium: 3.6 mmol/L (ref 3.5–5.1)
Sodium: 119 mmol/L — CL (ref 135–145)
Total Bilirubin: 17.7 mg/dL (ref 0.3–1.2)
Total Protein: 6.4 g/dL — ABNORMAL LOW (ref 6.5–8.1)

## 2021-04-15 LAB — CBC WITH DIFFERENTIAL (CANCER CENTER ONLY)
Abs Immature Granulocytes: 0.1 10*3/uL — ABNORMAL HIGH (ref 0.00–0.07)
Basophils Absolute: 0 10*3/uL (ref 0.0–0.1)
Basophils Relative: 0 %
Eosinophils Absolute: 0 10*3/uL (ref 0.0–0.5)
Eosinophils Relative: 0 %
HCT: 27.5 % — ABNORMAL LOW (ref 36.0–46.0)
Hemoglobin: 9.6 g/dL — ABNORMAL LOW (ref 12.0–15.0)
Immature Granulocytes: 1 %
Lymphocytes Relative: 5 %
Lymphs Abs: 0.9 10*3/uL (ref 0.7–4.0)
MCH: 28.7 pg (ref 26.0–34.0)
MCHC: 34.9 g/dL (ref 30.0–36.0)
MCV: 82.3 fL (ref 80.0–100.0)
Monocytes Absolute: 1 10*3/uL (ref 0.1–1.0)
Monocytes Relative: 6 %
Neutro Abs: 14.1 10*3/uL — ABNORMAL HIGH (ref 1.7–7.7)
Neutrophils Relative %: 88 %
Platelet Count: 269 10*3/uL (ref 150–400)
RBC: 3.34 MIL/uL — ABNORMAL LOW (ref 3.87–5.11)
RDW: 15.6 % — ABNORMAL HIGH (ref 11.5–15.5)
WBC Count: 16 10*3/uL — ABNORMAL HIGH (ref 4.0–10.5)
nRBC: 0.4 % — ABNORMAL HIGH (ref 0.0–0.2)

## 2021-04-15 LAB — TSH: TSH: 1.323 u[IU]/mL (ref 0.308–3.960)

## 2021-04-15 MED ORDER — OXYCODONE HCL 10 MG PO TABS: 10.0000 mg | ORAL_TABLET | Freq: Four times a day (QID) | ORAL | 0 refills | Status: DC | PRN

## 2021-04-15 MED ORDER — SODIUM CHLORIDE 0.9% FLUSH
10.0000 mL | Freq: Once | INTRAVENOUS | Status: AC
  Administered 2021-04-15: 10 mL

## 2021-04-15 MED ORDER — HEPARIN SOD (PORK) LOCK FLUSH 100 UNIT/ML IV SOLN
500.0000 [IU] | Freq: Once | INTRAVENOUS | Status: AC | PRN
  Administered 2021-04-15: 500 [IU]

## 2021-04-15 MED ORDER — SODIUM CHLORIDE 0.9 % IV SOLN
200.0000 mg | Freq: Once | INTRAVENOUS | Status: AC
  Administered 2021-04-15: 200 mg via INTRAVENOUS
  Filled 2021-04-15: qty 200

## 2021-04-15 MED ORDER — PACLITAXEL PROTEIN-BOUND CHEMO INJECTION 100 MG
80.0000 mg/m2 | Freq: Once | INTRAVENOUS | Status: AC
  Administered 2021-04-15: 150 mg via INTRAVENOUS
  Filled 2021-04-15: qty 30

## 2021-04-15 MED ORDER — NYSTATIN 100000 UNIT/ML MT SUSP
5.0000 mL | Freq: Four times a day (QID) | OROMUCOSAL | 0 refills | Status: DC
Start: 2021-04-15 — End: 2021-05-01

## 2021-04-15 MED ORDER — SODIUM CHLORIDE 0.9 % IV SOLN: Freq: Once | INTRAVENOUS | Status: AC

## 2021-04-15 MED ORDER — PROCHLORPERAZINE MALEATE 10 MG PO TABS
10.0000 mg | ORAL_TABLET | Freq: Once | ORAL | Status: AC
  Administered 2021-04-15: 10 mg via ORAL
  Filled 2021-04-15: qty 1

## 2021-04-15 MED ORDER — SODIUM CHLORIDE 0.9% FLUSH
10.0000 mL | INTRAVENOUS | Status: DC | PRN
  Administered 2021-04-15: 10 mL

## 2021-04-15 NOTE — Progress Notes (Signed)
OK to treat today w/ elevated heart rate and with today's CMP per Clovis Riley NP

## 2021-04-15 NOTE — Progress Notes (Signed)
Left message stating courtesy call and if any questions or concerns please call the doctors office.  

## 2021-04-15 NOTE — Patient Instructions (Signed)
[facility]  Discharge Instructions: Thank you for choosing [facility] to provide your oncology and hematology care.   If you have a lab appointment with the [facility], please go directly to the [facility] and check in at the registration area.   Wear comfortable clothing and clothing appropriate for easy access to any Portacath or PICC line.   We strive to give you quality time with your provider. You may need to reschedule your appointment if you arrive late (15 or more minutes).  Arriving late affects you and other patients whose appointments are after yours.  Also, if you miss three or more appointments without notifying the office, you may be dismissed from the clinic at the providers discretion.      For prescription refill requests, have your pharmacy contact our office and allow 72 hours for refills to be completed.    Today you received the following chemotherapy and/or immunotherapy agents :  Pembrolizumab (Keytruda)  and  Nanoparticle Albumin-Bound Paclitaxel (Abraxane)   To help prevent nausea and vomiting after your treatment, we encourage you to take your nausea medication as directed.  BELOW ARE SYMPTOMS THAT SHOULD BE REPORTED IMMEDIATELY: *FEVER GREATER THAN 100.4 F (38 C) OR HIGHER *CHILLS OR SWEATING *NAUSEA AND VOMITING THAT IS NOT CONTROLLED WITH YOUR NAUSEA MEDICATION *UNUSUAL SHORTNESS OF BREATH *UNUSUAL BRUISING OR BLEEDING *URINARY PROBLEMS (pain or burning when urinating, or frequent urination) *BOWEL PROBLEMS (unusual diarrhea, constipation, pain near the anus) TENDERNESS IN MOUTH AND THROAT WITH OR WITHOUT PRESENCE OF ULCERS (sore throat, sores in mouth, or a toothache) UNUSUAL RASH, SWELLING OR PAIN  UNUSUAL VAGINAL DISCHARGE OR ITCHING   Items with * indicate a potential emergency and should be followed up as soon as possible or go to the Emergency Department if any problems should occur.  Please show  the CHEMOTHERAPY ALERT CARD or IMMUNOTHERAPY ALERT CARD at check-in to the Emergency Department and triage nurse.  Should you have questions after your visit or need to cancel or reschedule your appointment, please contact [facility]  Dept: [phone]  and follow the prompts.  Office hours are 8:00 a.m. to 4:30 p.m. Monday - Friday. Please note that voicemails left after 4:00 p.m. may not be returned until the following business day.  We are closed weekends and major holidays. You have access to a nurse at all times for urgent questions. Please call the main number to the clinic Dept: [phone] and follow the prompts.   For any non-urgent questions, you may also contact your provider using MyChart. We now offer e-Visits for anyone 79 and older to request care online for non-urgent symptoms. For details visit [email or web address].   Also download the MyChart app! Go to the app store, search "MyChart", open the app, select [facility], and log in with your MyChart username and password.  Due to Covid, a mask is required upon entering the hospital/clinic. If you do not have a mask, one will be given to you upon arrival. For doctor visits, patients may have 1 support person aged 42 or older with them. For treatment visits, patients cannot have anyone with them due to current Covid guidelines and our immunocompromised population.   Pembrolizumab (Keytruda)  injection What is this medication? PEMBROLIZUMAB (pem broe liz ue mab) is a monoclonal antibody. It is used to treat certain types of cancer. This medicine may be used for other purposes; ask your health care provider or pharmacist  if you have questions. COMMON BRAND NAME(S): Keytruda What should I tell my care team before I take this medication? They need to know if you have any of these conditions: autoimmune diseases like Crohn's disease, ulcerative colitis, or lupus have had or planning to have an  allogeneic stem cell transplant (uses someone else's stem cells) history of organ transplant history of chest radiation nervous system problems like myasthenia gravis or Guillain-Barre syndrome an unusual or allergic reaction to pembrolizumab, other medicines, foods, dyes, or preservatives pregnant or trying to get pregnant breast-feeding How should I use this medication? This medicine is for infusion into a vein. It is given by a health care professional in a hospital or clinic setting. A special MedGuide will be given to you before each treatment. Be sure to read this information carefully each time. Talk to your pediatrician regarding the use of this medicine in children. While this drug may be prescribed for children as young as 6 months for selected conditions, precautions do apply. Overdosage: If you think you have taken too much of this medicine contact a poison control center or emergency room at once. NOTE: This medicine is only for you. Do not share this medicine with others. What if I miss a dose? It is important not to miss your dose. Call your doctor or health care professional if you are unable to keep an appointment. What may interact with this medication? Interactions have not been studied. This list may not describe all possible interactions. Give your health care provider a list of all the medicines, herbs, non-prescription drugs, or dietary supplements you use. Also tell them if you smoke, drink alcohol, or use illegal drugs. Some items may interact with your medicine. What should I watch for while using this medication? Your condition will be monitored carefully while you are receiving this medicine. You may need blood work done while you are taking this medicine. Do not become pregnant while taking this medicine or for 4 months after stopping it. Women should inform their doctor if they wish to become pregnant or think they might be pregnant. There is a potential for serious  side effects to an unborn child. Talk to your health care professional or pharmacist for more information. Do not breast-feed an infant while taking this medicine or for 4 months after the last dose. What side effects may I notice from receiving this medication? Side effects that you should report to your doctor or health care professional as soon as possible: allergic reactions like skin rash, itching or hives, swelling of the face, lips, or tongue bloody or black, tarry breathing problems changes in vision chest pain chills confusion constipation cough diarrhea dizziness or feeling faint or lightheaded fast or irregular heartbeat fever flushing joint pain low blood counts - this medicine may decrease the number of white blood cells, red blood cells and platelets. You may be at increased risk for infections and bleeding. muscle pain muscle weakness pain, tingling, numbness in the hands or feet persistent headache redness, blistering, peeling or loosening of the skin, including inside the mouth signs and symptoms of high blood sugar such as dizziness; dry mouth; dry skin; fruity breath; nausea; stomach pain; increased hunger or thirst; increased urination signs and symptoms of kidney injury like trouble passing urine or change in the amount of urine signs and symptoms of liver injury like dark urine, light-colored stools, loss of appetite, nausea, right upper belly pain, yellowing of the eyes or skin sweating swollen lymph nodes weight  loss Side effects that usually do not require medical attention (report to your doctor or health care professional if they continue or are bothersome): decreased appetite hair loss tiredness This list may not describe all possible side effects. Call your doctor for medical advice about side effects. You may report side effects to FDA at 1-800-FDA-1088. Where should I keep my medication? This drug is given in a hospital or clinic and will not be  stored at home. NOTE: This sheet is a summary. It may not cover all possible information. If you have questions about this medicine, talk to your doctor, pharmacist, or health care provider.  2022 Elsevier/Gold Standard (2020-12-01 00:00:00)  Nanoparticle Albumin-Bound Paclitaxel injection What is this medication? NANOPARTICLE ALBUMIN-BOUND PACLITAXEL (Na no PAHR ti kuhl  al BYOO muhn-bound  PAK li TAX el) is a chemotherapy drug. It targets fast dividing cells, like cancer cells, and causes these cells to die. This medicine is used to treat advanced breast cancer, lung cancer, and pancreatic cancer. This medicine may be used for other purposes; ask your health care provider or pharmacist if you have questions. COMMON BRAND NAME(S): Abraxane What should I tell my care team before I take this medication? They need to know if you have any of these conditions: kidney disease liver disease low blood counts, like low white cell, platelet, or red cell counts lung or breathing disease, like asthma tingling of the fingers or toes, or other nerve disorder an unusual or allergic reaction to paclitaxel, albumin, other chemotherapy, other medicines, foods, dyes, or preservatives pregnant or trying to get pregnant breast-feeding How should I use this medication? This drug is given as an infusion into a vein. It is administered in a hospital or clinic by a specially trained health care professional. Talk to your pediatrician regarding the use of this medicine in children. Special care may be needed. Overdosage: If you think you have taken too much of this medicine contact a poison control center or emergency room at once. NOTE: This medicine is only for you. Do not share this medicine with others. What if I miss a dose? It is important not to miss your dose. Call your doctor or health care professional if you are unable to keep an appointment. What may interact with this medication? This medicine may  interact with the following medications: antiviral medicines for hepatitis, HIV or AIDS certain antibiotics like erythromycin and clarithromycin certain medicines for fungal infections like ketoconazole and itraconazole certain medicines for seizures like carbamazepine, phenobarbital, phenytoin gemfibrozil nefazodone rifampin St. John's wort This list may not describe all possible interactions. Give your health care provider a list of all the medicines, herbs, non-prescription drugs, or dietary supplements you use. Also tell them if you smoke, drink alcohol, or use illegal drugs. Some items may interact with your medicine. What should I watch for while using this medication? Your condition will be monitored carefully while you are receiving this medicine. You will need important blood work done while you are taking this medicine. This medicine can cause serious allergic reactions. If you experience allergic reactions like skin rash, itching or hives, swelling of the face, lips, or tongue, tell your doctor or health care professional right away. In some cases, you may be given additional medicines to help with side effects. Follow all directions for their use. This drug may make you feel generally unwell. This is not uncommon, as chemotherapy can affect healthy cells as well as cancer cells. Report any side effects. Continue your  course of treatment even though you feel ill unless your doctor tells you to stop. Call your doctor or health care professional for advice if you get a fever, chills or sore throat, or other symptoms of a cold or flu. Do not treat yourself. This drug decreases your body's ability to fight infections. Try to avoid being around people who are sick. This medicine may increase your risk to bruise or bleed. Call your doctor or health care professional if you notice any unusual bleeding. Be careful brushing and flossing your teeth or using a toothpick because you may get an  infection or bleed more easily. If you have any dental work done, tell your dentist you are receiving this medicine. Avoid taking products that contain aspirin, acetaminophen, ibuprofen, naproxen, or ketoprofen unless instructed by your doctor. These medicines may hide a fever. Do not become pregnant while taking this medicine or for 6 months after stopping it. Women should inform their doctor if they wish to become pregnant or think they might be pregnant. Men should not father a child while taking this medicine or for 3 months after stopping it. There is a potential for serious side effects to an unborn child. Talk to your health care professional or pharmacist for more information. Do not breast-feed an infant while taking this medicine or for 2 weeks after stopping it. This medicine may interfere with the ability to get pregnant or to father a child. You should talk to your doctor or health care professional if you are concerned about your fertility. What side effects may I notice from receiving this medication? Side effects that you should report to your doctor or health care professional as soon as possible: allergic reactions like skin rash, itching or hives, swelling of the face, lips, or tongue breathing problems changes in vision fast, irregular heartbeat low blood pressure mouth sores pain, tingling, numbness in the hands or feet signs of decreased platelets or bleeding - bruising, pinpoint red spots on the skin, black, tarry stools, blood in the urine signs of decreased red blood cells - unusually weak or tired, feeling faint or lightheaded, falls signs of infection - fever or chills, cough, sore throat, pain or difficulty passing urine signs and symptoms of liver injury like dark yellow or brown urine; general ill feeling or flu-like symptoms; light-colored stools; loss of appetite; nausea; right upper belly pain; unusually weak or tired; yellowing of the eyes or skin swelling of the  ankles, feet, hands unusually slow heartbeat Side effects that usually do not require medical attention (report to your doctor or health care professional if they continue or are bothersome): diarrhea hair loss loss of appetite nausea, vomiting tiredness This list may not describe all possible side effects. Call your doctor for medical advice about side effects. You may report side effects to FDA at 1-800-FDA-1088. Where should I keep my medication? This drug is given in a hospital or clinic and will not be stored at home. NOTE: This sheet is a summary. It may not cover all possible information. If you have questions about this medicine, talk to your doctor, pharmacist, or health care provider.  2022 Elsevier/Gold Standard (2016-11-15 00:00:00)

## 2021-04-16 ENCOUNTER — Telehealth: Payer: Self-pay | Admitting: *Deleted

## 2021-04-16 LAB — T4: T4, Total: 5.2 ug/dL (ref 4.5–12.0)

## 2021-04-16 NOTE — Telephone Encounter (Signed)
Called pt to see how she did with her new treatment yesterday.  She reports sleeping all yesterday but didn't sleep as well last hs.  She denies N/V, constipation or diarrhea.  She is not really eating & therefore not taking her decadron.  Discussed trying dry crackers, toast, applesauce & trying a little bit of something every 2 hours.  She reports knowing how to reach [place] with any concerns/questions.

## 2021-04-16 NOTE — Telephone Encounter (Signed)
-----   Message from Ishmael Holter, RN sent at 04/15/2021  1:26 PM EST ----- Regarding: Dr. Lindi Adie 1st tx f/u call Dr. Lindi Adie 1st tx f/u call. Keytruda/ Abraxane - tolerated well

## 2021-04-19 ENCOUNTER — Encounter: Payer: Self-pay | Admitting: Hematology and Oncology

## 2021-04-19 ENCOUNTER — Inpatient Hospital Stay: Payer: BC Managed Care – PPO

## 2021-04-19 ENCOUNTER — Other Ambulatory Visit: Payer: Self-pay

## 2021-04-19 ENCOUNTER — Ambulatory Visit: Payer: BC Managed Care – PPO | Attending: Surgery

## 2021-04-19 ENCOUNTER — Inpatient Hospital Stay (HOSPITAL_BASED_OUTPATIENT_CLINIC_OR_DEPARTMENT_OTHER): Payer: BC Managed Care – PPO | Admitting: Adult Health

## 2021-04-19 ENCOUNTER — Encounter: Payer: Self-pay | Admitting: *Deleted

## 2021-04-19 VITALS — BP 81/64 | HR 130 | Temp 97.6°F | Resp 18 | Ht 63.0 in

## 2021-04-19 DIAGNOSIS — E876 Hypokalemia: Secondary | ICD-10-CM | POA: Diagnosis present

## 2021-04-19 DIAGNOSIS — J90 Pleural effusion, not elsewhere classified: Secondary | ICD-10-CM | POA: Diagnosis present

## 2021-04-19 DIAGNOSIS — Z888 Allergy status to other drugs, medicaments and biological substances status: Secondary | ICD-10-CM

## 2021-04-19 DIAGNOSIS — R5081 Fever presenting with conditions classified elsewhere: Secondary | ICD-10-CM | POA: Diagnosis present

## 2021-04-19 DIAGNOSIS — C50412 Malignant neoplasm of upper-outer quadrant of left female breast: Secondary | ICD-10-CM

## 2021-04-19 DIAGNOSIS — Z91018 Allergy to other foods: Secondary | ICD-10-CM

## 2021-04-19 DIAGNOSIS — Z515 Encounter for palliative care: Secondary | ICD-10-CM

## 2021-04-19 DIAGNOSIS — C787 Secondary malignant neoplasm of liver and intrahepatic bile duct: Secondary | ICD-10-CM | POA: Insufficient documentation

## 2021-04-19 DIAGNOSIS — Z170 Estrogen receptor positive status [ER+]: Secondary | ICD-10-CM

## 2021-04-19 DIAGNOSIS — E8809 Other disorders of plasma-protein metabolism, not elsewhere classified: Secondary | ICD-10-CM | POA: Diagnosis present

## 2021-04-19 DIAGNOSIS — G893 Neoplasm related pain (acute) (chronic): Secondary | ICD-10-CM | POA: Diagnosis present

## 2021-04-19 DIAGNOSIS — Z483 Aftercare following surgery for neoplasm: Secondary | ICD-10-CM | POA: Insufficient documentation

## 2021-04-19 DIAGNOSIS — L89152 Pressure ulcer of sacral region, stage 2: Secondary | ICD-10-CM | POA: Diagnosis present

## 2021-04-19 DIAGNOSIS — K838 Other specified diseases of biliary tract: Secondary | ICD-10-CM | POA: Diagnosis present

## 2021-04-19 DIAGNOSIS — Z91048 Other nonmedicinal substance allergy status: Secondary | ICD-10-CM

## 2021-04-19 DIAGNOSIS — J45909 Unspecified asthma, uncomplicated: Secondary | ICD-10-CM | POA: Diagnosis present

## 2021-04-19 DIAGNOSIS — Z79899 Other long term (current) drug therapy: Secondary | ICD-10-CM

## 2021-04-19 DIAGNOSIS — E871 Hypo-osmolality and hyponatremia: Secondary | ICD-10-CM | POA: Diagnosis present

## 2021-04-19 DIAGNOSIS — E7800 Pure hypercholesterolemia, unspecified: Secondary | ICD-10-CM | POA: Diagnosis present

## 2021-04-19 DIAGNOSIS — D630 Anemia in neoplastic disease: Secondary | ICD-10-CM | POA: Diagnosis present

## 2021-04-19 DIAGNOSIS — R21 Rash and other nonspecific skin eruption: Secondary | ICD-10-CM | POA: Diagnosis not present

## 2021-04-19 DIAGNOSIS — Z20822 Contact with and (suspected) exposure to covid-19: Secondary | ICD-10-CM | POA: Diagnosis present

## 2021-04-19 DIAGNOSIS — Z803 Family history of malignant neoplasm of breast: Secondary | ICD-10-CM

## 2021-04-19 DIAGNOSIS — E669 Obesity, unspecified: Secondary | ICD-10-CM | POA: Diagnosis present

## 2021-04-19 DIAGNOSIS — Z91040 Latex allergy status: Secondary | ICD-10-CM

## 2021-04-19 DIAGNOSIS — Z6831 Body mass index (BMI) 31.0-31.9, adult: Secondary | ICD-10-CM

## 2021-04-19 DIAGNOSIS — L539 Erythematous condition, unspecified: Secondary | ICD-10-CM | POA: Diagnosis not present

## 2021-04-19 DIAGNOSIS — I959 Hypotension, unspecified: Secondary | ICD-10-CM | POA: Diagnosis present

## 2021-04-19 DIAGNOSIS — Z95828 Presence of other vascular implants and grafts: Secondary | ICD-10-CM

## 2021-04-19 DIAGNOSIS — R627 Adult failure to thrive: Secondary | ICD-10-CM | POA: Diagnosis present

## 2021-04-19 DIAGNOSIS — Z79891 Long term (current) use of opiate analgesic: Secondary | ICD-10-CM

## 2021-04-19 DIAGNOSIS — E869 Volume depletion, unspecified: Secondary | ICD-10-CM | POA: Diagnosis present

## 2021-04-19 DIAGNOSIS — K121 Other forms of stomatitis: Secondary | ICD-10-CM | POA: Diagnosis present

## 2021-04-19 DIAGNOSIS — D708 Other neutropenia: Principal | ICD-10-CM | POA: Diagnosis present

## 2021-04-19 DIAGNOSIS — T361X5A Adverse effect of cephalosporins and other beta-lactam antibiotics, initial encounter: Secondary | ICD-10-CM | POA: Diagnosis not present

## 2021-04-19 LAB — CBC WITH DIFFERENTIAL (CANCER CENTER ONLY)
Abs Immature Granulocytes: 0.02 10*3/uL (ref 0.00–0.07)
Basophils Absolute: 0 10*3/uL (ref 0.0–0.1)
Basophils Relative: 0 %
Eosinophils Absolute: 0 10*3/uL (ref 0.0–0.5)
Eosinophils Relative: 0 %
HCT: 28.7 % — ABNORMAL LOW (ref 36.0–46.0)
Hemoglobin: 9.7 g/dL — ABNORMAL LOW (ref 12.0–15.0)
Immature Granulocytes: 1 %
Lymphocytes Relative: 11 %
Lymphs Abs: 0.4 10*3/uL — ABNORMAL LOW (ref 0.7–4.0)
MCH: 28.9 pg (ref 26.0–34.0)
MCHC: 33.8 g/dL (ref 30.0–36.0)
MCV: 85.4 fL (ref 80.0–100.0)
Monocytes Absolute: 0 10*3/uL — ABNORMAL LOW (ref 0.1–1.0)
Monocytes Relative: 1 %
Neutro Abs: 3.2 10*3/uL (ref 1.7–7.7)
Neutrophils Relative %: 87 %
Platelet Count: 197 10*3/uL (ref 150–400)
RBC: 3.36 MIL/uL — ABNORMAL LOW (ref 3.87–5.11)
RDW: 15.7 % — ABNORMAL HIGH (ref 11.5–15.5)
WBC Count: 3.6 10*3/uL — ABNORMAL LOW (ref 4.0–10.5)
nRBC: 0.6 % — ABNORMAL HIGH (ref 0.0–0.2)

## 2021-04-19 LAB — CMP (CANCER CENTER ONLY)
ALT: 245 U/L — ABNORMAL HIGH (ref 0–44)
AST: 412 U/L (ref 15–41)
Albumin: 1.8 g/dL — ABNORMAL LOW (ref 3.5–5.0)
Alkaline Phosphatase: 950 U/L — ABNORMAL HIGH (ref 38–126)
Anion gap: 13 (ref 5–15)
BUN: 11 mg/dL (ref 6–20)
CO2: 24 mmol/L (ref 22–32)
Calcium: 7.9 mg/dL — ABNORMAL LOW (ref 8.9–10.3)
Chloride: 84 mmol/L — ABNORMAL LOW (ref 98–111)
Creatinine: <0.3 mg/dL — ABNORMAL LOW (ref 0.44–1.00)
Glucose, Bld: 104 mg/dL — ABNORMAL HIGH (ref 70–99)
Potassium: 3.6 mmol/L (ref 3.5–5.1)
Sodium: 121 mmol/L — ABNORMAL LOW (ref 135–145)
Total Bilirubin: 21 mg/dL (ref 0.3–1.2)
Total Protein: 6.6 g/dL (ref 6.5–8.1)

## 2021-04-19 LAB — TSH: TSH: 2.348 u[IU]/mL (ref 0.308–3.960)

## 2021-04-19 MED ORDER — SODIUM CHLORIDE 0.9 % IV SOLN: Freq: Once | INTRAVENOUS | Status: AC

## 2021-04-19 MED ORDER — SODIUM CHLORIDE 0.9 % IV SOLN: INTRAVENOUS | Status: AC

## 2021-04-19 MED ORDER — SODIUM CHLORIDE 0.9% FLUSH
10.0000 mL | Freq: Once | INTRAVENOUS | Status: AC
  Administered 2021-04-19: 10 mL

## 2021-04-19 MED ORDER — ALTEPLASE 2 MG IJ SOLR: 2.0000 mg | Freq: Once | INTRAMUSCULAR | Status: DC

## 2021-04-19 MED ORDER — HEPARIN SOD (PORK) LOCK FLUSH 100 UNIT/ML IV SOLN: 500.0000 [IU] | Freq: Once | INTRAVENOUS | Status: DC

## 2021-04-19 NOTE — Progress Notes (Signed)
Orthostatic vital signs taken and documented in flowsheets. Results given to Wilber Bihari, [place]. No other orders given and pt discharged home in stable condition.

## 2021-04-19 NOTE — Patient Instructions (Signed)

## 2021-04-19 NOTE — Progress Notes (Signed)
CRITICAL VALUE STICKER  CRITICAL VALUE: Total bili 21 and AST 412  RECEIVER (on-site recipient of call): Merleen Nicely, [place] NOTIFIED: 04/19/21 at 47  MD NOTIFIED: Wilber Bihari, NP   RESPONSE: Mendel Ryder notified, verbalized understanding.  No orders received at this time.

## 2021-04-19 NOTE — Assessment & Plan Note (Addendum)
01/18/2019:Patient palpated a left breast lump for 2 weeks. Mammogram showed a 2.3cm left breast mass at the 12 o'clock position, with 1 enlarged left axillary lymph node measuring 3.5cm. 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 T2N1 stage IIa clinical stage  Treatment plan: 1. Neoadjuvant chemotherapy with TCH Perjeta 6 cycles (maintenance anti-HER-2 therapy is not being given because the initial pathology was reread as HER-2 negative.  The final pathology on the lumpectomy was also HER-2 negative) ER 60% week, PR negative 2. 06/13/2019 left lumpectomy ([facility]): no residual carcinoma, 2/3 left axillary lymph nodes positive for carcinoma. 07/15/2019: ALN D: 3/3 lymph nodes positive.  No extranodal extension 3. Followed by adjuvant radiation therapy started 08/20/2019 No adverse effects from participation in the neuropathy clinical trial SWOG 1714 CT Chest 01/25/21: No PE, Lymphadenopathy in mediastinum and hilar regions, Mass vs LN in axillary tain METASTATIC progression: 03/23/2021: Widespread metastatic disease including the liver thoracoabdominal nodes and left chest wall/breast.  Gallbladder wall thickening, small right pleural effusion. 03/25/2021: Diagnostic mammogram showing several left breast masses concerning for malignancy the largest being 4.6 cm.  --------------------------------------------------------------------------------------------------------------------------------------------------------------------------------------------------------------------------------------  1.  Metastatic breast cancer: She had a T bili of 17.7 last week.  She met with Dr. Lindi Adie and opted to proceed with treatment.  She does feel weak but has been eating.  Her c-Met is pending today.  Our chemistry analyzer is down and all labs have been sent over to [facility].  2.   Cancer related pain: She is working with palliative care on this and it is controlled with the fentanyl patch and oxycodone.  This appears to be improved.  3.  Thrush: Improved with nystatin  4.  Orthostatic hypotension: We will give her a liter of normal saline today and reevaluate.  We also discussed weakness and her working with home health physical therapy and home palliative care.  I told her that we really want to avoid hospitalization if possible.   Paige Perry will return daily for IV fluids, and Friday for labs follow-up with Dr. Lindi Adie.  Addendum: [place] reached out to me.  Paige Perry is feeling better since receiving IV fluids and her blood pressure has improved.  She will return tomorrow for repeat fluids.  I reviewed the above with Dr. Lindi Adie who is in agreement with the assessment and plan as stated.  I have placed orders for home health physical therapy and referral to in-home palliative care so that Paige Perry can have extra support at her home.

## 2021-04-19 NOTE — Progress Notes (Signed)
[facility] Follow up:    Paige Beals, NP [place] [address]   DIAGNOSIS:  Cancer Staging  Liver metastases [facility]) Staging form: Liver, AJCC 8th Edition - Clinical: Stage IVB (cM1) 02/2021 Malignant neoplasm of upper-outer quadrant of left breast in female, estrogen receptor positive ([place]) Staging form: Breast, AJCC 8th Edition - Clinical stage from 01/23/2019: Stage IIA (cT2, cN1(f), cM0, G2, ER+, PR-, HER2+) - Pathologic stage from 06/20/2019: (ypT0, pN1, cM0, ER+, PR-, HER2-)    SUMMARY OF ONCOLOGIC HISTORY: Oncology History  Malignant neoplasm of upper-outer quadrant of left breast in female, estrogen receptor positive ([place])  01/18/2019 Initial Diagnosis   Patient palpated a left breast lump for 2 weeks. Mammogram showed a 2.3cm left breast mass at the 12 o'clock position, with 1 enlarged left axillary lymph node measuring 3.5cm. 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   01/23/2019 Cancer Staging   Staging form: Breast, AJCC 8th Edition - Clinical stage from 01/23/2019: Stage IIA (cT2, cN1(f), cM0, G2, ER+, PR-, HER2+)   02/01/2019 Genetic Testing   Negative genetic testing. No pathogenic variants identified on the [facility] Common Hereditary Cancers Panel. The Common Hereditary Cancers Panel offered by [facility] includes sequencing and/or deletion duplication testing of the following 48 genes: APC, ATM, AXIN2, BARD1, BMPR1A, BRCA1, BRCA2, BRIP1, CDH1, CDKN2A (p14ARF), CDKN2A (p16INK4a), CKD4, CHEK2, CTNNA1, DICER1, EPCAM (Deletion/duplication testing only), GREM1 (promoter region deletion/duplication testing only), KIT, MEN1, MLH1, MSH2, MSH3, MSH6, MUTYH, NBN, NF1, NHTL1, PALB2, PDGFRA, PMS2, POLD1, POLE, PTEN, RAD50, RAD51C, RAD51D, RNF43, SDHB, SDHC, SDHD, SMAD4, SMARCA4. STK11, TP53, TSC1, TSC2, and VHL.  The following genes were evaluated for sequence changes only: SDHA and HOXB13 c.251G>A variant only.  The report date is 02/01/2019.    02/13/2019 - 05/28/2019 Chemotherapy   dexamethasone (DECADRON) 4 MG tablet, 4 mg (100 % of original dose 4 mg), Oral, Daily, 1 of 1 cycle, Start date: 01/23/2019, End date: 05/28/2019. Dose modification: 4 mg (original dose 4 mg, Cycle 0)  palonosetron (ALOXI) injection 0.25 mg, 0.25 mg, Intravenous,  Once, 6 of 6 cycles. Administration: 0.25 mg (02/13/2019), 0.25 mg (03/04/2019), 0.25 mg (05/07/2019), 0.25 mg (05/28/2019), 0.25 mg (03/26/2019), 0.25 mg (04/18/2019)  pegfilgrastim-jmdb (FULPHILA) injection 6 mg, 6 mg, Subcutaneous,  Once, 6 of 6 cycles. Administration: 6 mg (02/15/2019), 6 mg (03/06/2019), 6 mg (05/09/2019), 6 mg (05/30/2019), 6 mg (03/28/2019), 6 mg (04/20/2019)  CARBOplatin (PARAPLATIN) 700 mg in sodium chloride 0.9 % 250 mL chemo infusion, 700 mg (100 % of original dose 700 mg), Intravenous,  Once, 6 of 6 cycles. Dose modification: 700 mg (original dose 700 mg, Cycle 1). Administration: 700 mg (02/13/2019), 700 mg (03/04/2019), 700 mg (05/07/2019), 700 mg (05/28/2019), 700 mg (03/26/2019), 700 mg (04/18/2019)  DOCEtaxel (TAXOTERE) 140 mg in sodium chloride 0.9 % 250 mL chemo infusion, 75 mg/m2 = 140 mg, Intravenous,  Once, 6 of 6 cycles. Dose modification: 65 mg/m2 (original dose 75 mg/m2, Cycle 5, Reason: Dose not tolerated). Administration: 140 mg (02/13/2019), 140 mg (03/04/2019), 120 mg (05/07/2019), 120 mg (05/28/2019), 140 mg (03/26/2019), 140 mg (04/18/2019)  pertuzumab (PERJETA) 420 mg in sodium chloride 0.9 % 250 mL chemo infusion, 420 mg (100 % of original dose 420 mg), Intravenous, Once, 3 of 3 cycles. Dose modification: 420 mg (original dose 420 mg, Cycle 1, Reason: Provider Judgment). Administration: 420 mg (02/13/2019), 420 mg (03/04/2019), 420 mg (03/26/2019)  fosaprepitant (EMEND) 150 mg  dexamethasone (DECADRON) 12 mg in sodium  chloride 0.9 % 145 mL IVPB, , Intravenous,  Once, 6 of 6 cycles. Administration:  (02/13/2019),  (03/04/2019),  (05/07/2019),   (05/28/2019),  (03/26/2019),  (04/18/2019)  trastuzumab-dkst (OGIVRI) 651 mg in sodium chloride 0.9 % 250 mL chemo infusion, 8 mg/kg = 651 mg, Intravenous,  Once, 6 of 6 cycles. Administration: 651 mg (02/13/2019), 483 mg (03/04/2019), 483 mg (05/07/2019), 483 mg (05/28/2019), 483 mg (03/26/2019), 483 mg (04/18/2019).   06/13/2019 Surgery   Left lumpectomy ([place]) [phone]): no residual carcinoma, 2/3 left axillary lymph nodes positive for carcinoma. Lymphovascular space invasion was present.   06/20/2019 Cancer Staging   Staging form: Breast, AJCC 8th Edition - Pathologic stage from 06/20/2019: No Stage Recommended (ypT0, pN1, cM0, ER+, PR-, HER2-)   07/11/2019 Surgery   Axillary lymph node dissection ([place]) ([id number]): 3/3 lymph nodes positive for cancer   08/19/2019 - 10/07/2019 Radiation Therapy   The patient initially received a dose of 50.4 Gy in 28 fractions to the breast and SCLV region using a 4-field approach. This was delivered using a 3-D conformal technique. The patient then received a boost to the seroma. This delivered an additional 10 Gy in 5 fractions using a 3-field photon boost technique. The total dose was 60.4 Gy.   09/2019 - 09/2029 Anti-estrogen oral therapy   Tamoxifen   04/15/2021 -  Chemotherapy   Patient is on Treatment Plan : BREAST Paclitaxel-Albumin (Abraxane) (100) D1,8,15 + Pembrolizumab (200) every 3 weeks, q28d     Liver metastases ([place])  04/19/2021 Initial Diagnosis   Liver metastases ([place])   04/19/2021 Cancer Staging   Staging form: Liver, AJCC 8th Edition - Clinical: Stage IVB (cM1) - Signed by Gardenia Phlegm, NP on 04/19/2021      CURRENT THERAPY: Abraxane/Pembrolizumab  INTERVAL HISTORY: Paige Perry 46 y.o. female returns for evaluation after receiving Abraxane and pembrolizumab on Friday.  She says that her appetite has improved and she is eating more.  She also feels like she is becoming more weak.  She says today she fell this morning  and she does get dizzy upon standing.  She lives alone.  She is drinking more fluids but does struggle with this.  Her c-Met is not yet back today.  Paige Perry says that she is having about 2 bowel movements per day.  She continues on fentanyl patch and oxycodone as needed for pain management and she notes that she has improved pain control today as compared to last week.   Patient Active Problem List   Diagnosis Date Noted   Liver metastases ([place]) 04/19/2021   Port-A-Cath in place 03/26/2019   Genetic testing 02/01/2019   Family history of prostate cancer    Family history of throat cancer    Family history of breast cancer    Malignant neoplasm of upper-outer quadrant of left breast in female, estrogen receptor positive ([place]) 01/18/2019   Other allergic rhinitis 12/03/2014    is allergic to latex, other, adhesive [tape], and tapentadol.  MEDICAL HISTORY: Past Medical History:  Diagnosis Date   Asthma    Cancer ([place])    breast   Family history of breast cancer    Family history of breast cancer    Family history of prostate cancer    Family history of throat cancer    Hypercholesteremia    PONV (postoperative nausea and vomiting)     SURGICAL HISTORY: Past Surgical History:  Procedure Laterality Date   AXILLARY LYMPH NODE DISSECTION Left 07/11/2019   Procedure: LEFT AXILLARY  LYMPH NODE DISSECTION;  Surgeon: Donnie Mesa, MD;  Location: [facility];  Service: General;  Laterality: Left;   BREAST LUMPECTOMY WITH RADIOACTIVE SEED AND AXILLARY LYMPH NODE DISSECTION Left 06/13/2019   Procedure: LEFT BREAST LUMPECTOMY WITH RADIOACTIVE SEED AND AXILLARY TARGETED LYMPH NODE DISSECTION;  Surgeon: Donnie Mesa, MD;  Location: [place] [facility];  Service: General;  Laterality: Left;   FINGER SURGERY     PORTACATH PLACEMENT Right 02/05/2019   Procedure: INSERTION PORT-A-CATH WITH ULTRASOUND;  Surgeon: Donnie Mesa, MD;  Location: [place];   Service: General;  Laterality: Right;   PORTACATH PLACEMENT Right 04/14/2021   Procedure: INSERTION PORT-A-CATH;  Surgeon: Donnie Mesa, MD;  Location: [facility];  Service: General;  Laterality: Right;   WISDOM TOOTH EXTRACTION     WOUND EXPLORATION Right 06/13/2019   Procedure: RIGHT CHEST WOUND EXPLORATION;  Surgeon: Donnie Mesa, MD;  Location: [facility];  Service: General;  Laterality: Right;    SOCIAL HISTORY: Social History   Socioeconomic History   Marital status: Single    Spouse name: Not on file   Number of children: Not on file   Years of education: Not on file   Highest education level: Not on file  Occupational History   Not on file  Tobacco Use   Smoking status: Never   Smokeless tobacco: Never  Vaping Use   Vaping Use: Never used  Substance and Sexual Activity   Alcohol use: Yes    Comment: occasionally   Drug use: No   Sexual activity: Yes    Birth control/protection: None  Other Topics Concern   Not on file  Social History Narrative   Not on file   Social Determinants of Health   Financial Resource Strain: Not on file  Food Insecurity: Not on file  Transportation Needs: Not on file  Physical Activity: Not on file  Stress: Not on file  Social Connections: Not on file  Intimate Partner Violence: Not on file    FAMILY HISTORY: Family History  Problem Relation Age of Onset   Hypertension Mother    Heart failure Mother    Breast cancer Maternal Aunt        dx 41s   Breast cancer Paternal Aunt    Prostate cancer Father 31   Throat cancer Cousin     Review of Systems  Constitutional:  Positive for fatigue. Negative for appetite change, chills, fever and unexpected weight change.  HENT:   Negative for hearing loss, lump/mass and trouble swallowing.   Eyes:  Negative for eye problems and icterus.  Respiratory:  Negative for chest tightness, cough and shortness of breath.   Cardiovascular:  Negative for chest  pain, leg swelling and palpitations.  Gastrointestinal:  Positive for abdominal distention. Negative for abdominal pain, constipation, diarrhea, nausea and vomiting.  Endocrine: Negative for hot flashes.  Genitourinary:  Negative for difficulty urinating.   Musculoskeletal:  Negative for arthralgias.  Skin:  Negative for itching and rash.  Neurological:  Negative for dizziness, extremity weakness, headaches and numbness.  Hematological:  Negative for adenopathy. Does not bruise/bleed easily.  Psychiatric/Behavioral:  Negative for depression. The patient is not nervous/anxious.      PHYSICAL EXAMINATION  ECOG PERFORMANCE STATUS: 3 - Symptomatic, >50% confined to bed  Vitals:   04/19/21 0922  BP: (!) 81/64  Pulse: (!) 130  Resp: 18  Temp: 97.6 F (36.4 C)  SpO2: 100%   Blood pressure at standing was 70/50.  Heart rate regular and tachycardic at 125. Physical Exam Constitutional:      General: She is not in acute distress.    Appearance: Normal appearance. She is not toxic-appearing.  HENT:     Head: Normocephalic and atraumatic.  Eyes:     General: No scleral icterus. Cardiovascular:     Rate and Rhythm: Regular rhythm. Tachycardia present.     Pulses: Normal pulses.     Heart sounds: Normal heart sounds.  Pulmonary:     Effort: Pulmonary effort is normal.     Breath sounds: Normal breath sounds.  Abdominal:     General: Abdomen is flat. Bowel sounds are normal. There is no distension.     Palpations: Abdomen is soft.     Tenderness: There is no abdominal tenderness.  Musculoskeletal:        General: No swelling.     Cervical back: Neck supple.  Lymphadenopathy:     Cervical: No cervical adenopathy.  Skin:    General: Skin is warm and dry.     Findings: No rash.  Neurological:     General: No focal deficit present.     Mental Status: She is alert.  Psychiatric:        Mood and Affect: Mood normal.        Behavior: Behavior normal.    LABORATORY DATA:  CBC     Component Value Date/Time   WBC 3.6 (L) 04/19/2021 0857   WBC 5.8 03/22/2021 0955   RBC 3.36 (L) 04/19/2021 0857   HGB 9.7 (L) 04/19/2021 0857   HCT 28.7 (L) 04/19/2021 0857   PLT 197 04/19/2021 0857   MCV 85.4 04/19/2021 0857   MCH 28.9 04/19/2021 0857   MCHC 33.8 04/19/2021 0857   RDW 15.7 (H) 04/19/2021 0857   LYMPHSABS 0.4 (L) 04/19/2021 0857   MONOABS 0.0 (L) 04/19/2021 0857   EOSABS 0.0 04/19/2021 0857   BASOSABS 0.0 04/19/2021 0857    CMP     Component Value Date/Time   NA 121 (L) 04/19/2021 0857   K 3.6 04/19/2021 0857   CL 84 (L) 04/19/2021 0857   CO2 24 04/19/2021 0857   GLUCOSE 104 (H) 04/19/2021 0857   GLUCOSE 87 03/31/2006 1359   BUN 11 04/19/2021 0857   CREATININE <0.30 (L) 04/19/2021 0857   CALCIUM 7.9 (L) 04/19/2021 0857   PROT 6.6 04/19/2021 0857   ALBUMIN 1.8 (L) 04/19/2021 0857   AST 412 (HH) 04/19/2021 0857   ALT 245 (H) 04/19/2021 0857   ALKPHOS 950 (H) 04/19/2021 0857   BILITOT 21.0 ([place]) 04/19/2021 0857   GFRNONAA NOT CALCULATED 04/19/2021 0857   GFRAA >60 05/28/2019 0845       ASSESSMENT and THERAPY PLAN:   Malignant neoplasm of upper-outer quadrant of left breast in female, estrogen receptor positive ([place]) 01/18/2019: Patient palpated a left breast lump for 2 weeks. Mammogram showed a 2.3cm left breast mass at the 12 o'clock position, with 1 enlarged left axillary lymph node measuring 3.5cm. 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  ER positive, PR negative, HER-2 positive (initial IHC 3+) accidentally FISH was done which was negative. T2N1 stage IIa clinical stage   Treatment plan: 1. Neoadjuvant chemotherapy with TCH [place] 6 cycles (maintenance anti-HER-2 therapy is not being given because the initial pathology was reread as HER-2 negative.  The final pathology on the lumpectomy was also HER-2 negative) ER 60% week, PR negative 2. 06/13/2019 left lumpectomy ([facility]): no residual carcinoma, 2/3  left axillary  lymph nodes positive for carcinoma. 07/15/2019: ALN D: 3/3 lymph nodes positive.  No extranodal extension 3. Followed by adjuvant radiation therapy started 08/20/2019 No adverse effects from participation in the neuropathy clinical trial SWOG 1714 CT Chest 01/25/21: No PE, Lymphadenopathy in mediastinum and hilar regions, Mass vs LN in axillary tain METASTATIC progression: 03/23/2021: Widespread metastatic disease including the liver thoracoabdominal nodes and left chest wall/breast.  Gallbladder wall thickening, small right pleural effusion. 03/25/2021: Diagnostic mammogram showing several left breast masses concerning for malignancy the largest being 4.6 cm.  --------------------------------------------------------------------------------------------------------------------------------------------------------------------------------------------------------------------------------------  1.  Metastatic breast cancer: She had a T bili of 17.7 last week.  She met with Dr. Lindi Adie and opted to proceed with treatment.  She does feel weak but has been eating.  Her c-Met is pending today.  Our chemistry analyzer is down and all labs have been sent over to [facility].  2.  Cancer related pain: She is working with palliative care on this and it is controlled with the fentanyl patch and oxycodone.  This appears to be improved.  3.  Thrush: Improved with nystatin  4.  Orthostatic hypotension: We will give her a liter of normal saline today and reevaluate.  We also discussed weakness and her working with home health physical therapy and home palliative care.  I told her that we really want to avoid hospitalization if possible.   Paige Perry will return daily for IV fluids, and Friday for labs follow-up with Dr. Lindi Adie.  Addendum: Paige Perry reached out to me.  Paige Perry is feeling better since receiving IV fluids and her blood pressure has improved.  She will return tomorrow for repeat fluids.  I  reviewed the above with Dr. Lindi Adie who is in agreement with the assessment and plan as stated.  I have placed orders for home health physical therapy and referral to in-home palliative care so that Paige Perry can have extra support at her home.    All questions were answered. The patient knows to call the clinic with any problems, questions or concerns. We can certainly see the patient much sooner if necessary.  Total encounter time: 45 minutes in face-to-face visit time, chart review, lab review, care coordination, and documentation of the encounter.  Wilber Bihari, NP 04/19/21 1:47 PM Medical Oncology and Hematology [facility] [place], [place] [address] Tel. [phone]    Fax. [phone]  *Total Encounter Time as defined by the Centers for Medicare and Medicaid Services includes, in addition to the face-to-face time of a patient visit (documented in the note above) non-face-to-face time: obtaining and reviewing outside history, ordering and reviewing medications, tests or procedures, care coordination (communications with other health care professionals or caregivers) and documentation in the medical record.

## 2021-04-19 NOTE — Progress Notes (Unsigned)
CRITICAL TBIL 21.0 AND AST 412 CALLED TO KELSEY DESOTA, RN AT [place] [facility] BY PAM Elic Vencill

## 2021-04-20 ENCOUNTER — Inpatient Hospital Stay: Payer: BC Managed Care – PPO

## 2021-04-20 ENCOUNTER — Encounter: Payer: Self-pay | Admitting: *Deleted

## 2021-04-20 VITALS — BP 84/61 | HR 119 | Temp 98.1°F | Resp 17

## 2021-04-20 DIAGNOSIS — Z95828 Presence of other vascular implants and grafts: Secondary | ICD-10-CM

## 2021-04-20 DIAGNOSIS — Z170 Estrogen receptor positive status [ER+]: Secondary | ICD-10-CM

## 2021-04-20 LAB — T4: T4, Total: 6.4 ug/dL (ref 4.5–12.0)

## 2021-04-20 MED ORDER — HEPARIN SOD (PORK) LOCK FLUSH 100 UNIT/ML IV SOLN
500.0000 [IU] | Freq: Once | INTRAVENOUS | Status: AC
  Administered 2021-04-20: 500 [IU]

## 2021-04-20 MED ORDER — SODIUM CHLORIDE 0.9% FLUSH
10.0000 mL | Freq: Once | INTRAVENOUS | Status: AC
  Administered 2021-04-20: 10 mL

## 2021-04-20 MED ORDER — SODIUM CHLORIDE 0.9 % IV SOLN: Freq: Once | INTRAVENOUS | Status: AC

## 2021-04-20 NOTE — Patient Instructions (Signed)

## 2021-04-21 ENCOUNTER — Other Ambulatory Visit: Payer: Self-pay | Admitting: Adult Health

## 2021-04-21 ENCOUNTER — Other Ambulatory Visit: Payer: Self-pay

## 2021-04-21 ENCOUNTER — Ambulatory Visit: Payer: BC Managed Care – PPO | Admitting: Hematology and Oncology

## 2021-04-21 ENCOUNTER — Encounter: Payer: Self-pay | Admitting: Hematology and Oncology

## 2021-04-21 ENCOUNTER — Inpatient Hospital Stay: Payer: BC Managed Care – PPO

## 2021-04-21 ENCOUNTER — Telehealth: Payer: Self-pay

## 2021-04-21 ENCOUNTER — Ambulatory Visit (HOSPITAL_BASED_OUTPATIENT_CLINIC_OR_DEPARTMENT_OTHER): Payer: BC Managed Care – PPO | Admitting: Adult Health

## 2021-04-21 VITALS — BP 86/65 | HR 131 | Temp 99.6°F | Resp 24

## 2021-04-21 DIAGNOSIS — Z95828 Presence of other vascular implants and grafts: Secondary | ICD-10-CM

## 2021-04-21 DIAGNOSIS — Z170 Estrogen receptor positive status [ER+]: Secondary | ICD-10-CM | POA: Diagnosis not present

## 2021-04-21 DIAGNOSIS — C50412 Malignant neoplasm of upper-outer quadrant of left female breast: Secondary | ICD-10-CM | POA: Diagnosis not present

## 2021-04-21 DIAGNOSIS — E876 Hypokalemia: Secondary | ICD-10-CM

## 2021-04-21 HISTORY — DX: Hypokalemia: E87.6

## 2021-04-21 LAB — COMPREHENSIVE METABOLIC PANEL
ALT: 184 U/L — ABNORMAL HIGH (ref 0–44)
AST: 255 U/L (ref 15–41)
Albumin: 2 g/dL — ABNORMAL LOW (ref 3.5–5.0)
Alkaline Phosphatase: 1194 U/L — ABNORMAL HIGH (ref 38–126)
Anion gap: 8 (ref 5–15)
BUN: 6 mg/dL (ref 6–20)
CO2: 27 mmol/L (ref 22–32)
Calcium: 6.8 mg/dL — ABNORMAL LOW (ref 8.9–10.3)
Chloride: 90 mmol/L — ABNORMAL LOW (ref 98–111)
Creatinine, Ser: 0.38 mg/dL — ABNORMAL LOW (ref 0.44–1.00)
GFR, Estimated: >60 mL/min (ref >60)
Glucose, Bld: 88 mg/dL (ref 70–99)
Potassium: 2.6 mmol/L — CL (ref 3.5–5.1)
Sodium: 125 mmol/L — ABNORMAL LOW (ref 135–145)
Total Bilirubin: 18.6 mg/dL (ref 0.3–1.2)
Total Protein: 4.4 g/dL — ABNORMAL LOW (ref 6.5–8.1)

## 2021-04-21 MED ORDER — POTASSIUM CHLORIDE 10 MEQ/100ML IV SOLN
10.0000 meq | INTRAVENOUS | Status: AC
  Administered 2021-04-21 (×2): 10 meq via INTRAVENOUS
  Filled 2021-04-21 (×2): qty 100

## 2021-04-21 MED ORDER — SODIUM CHLORIDE 0.9% FLUSH
10.0000 mL | Freq: Once | INTRAVENOUS | Status: AC
  Administered 2021-04-21: 10 mL

## 2021-04-21 MED ORDER — FLUCONAZOLE 100 MG PO TABS
100.0000 mg | ORAL_TABLET | Freq: Every day | ORAL | 0 refills | Status: DC
Start: 2021-04-21 — End: 2021-05-01

## 2021-04-21 MED ORDER — SODIUM CHLORIDE 0.9 % IV SOLN: Freq: Once | INTRAVENOUS | Status: AC

## 2021-04-21 MED ORDER — HEPARIN SOD (PORK) LOCK FLUSH 100 UNIT/ML IV SOLN
500.0000 [IU] | Freq: Once | INTRAVENOUS | Status: AC
  Administered 2021-04-21: 500 [IU]

## 2021-04-21 NOTE — Progress Notes (Incomplete)
Patient Care Team: [name], NP as PCP - General Mauro Kaufmann, RN as Oncology Nurse Navigator Carlynn Spry, Charlott Holler, RN as Oncology Nurse Navigator Donnie Mesa, MD as Consulting Physician (General Surgery) Nicholas Lose, MD as Consulting Physician (Hematology and Oncology) Kyung Rudd, MD as Consulting Physician (Radiation Oncology) Pickenpack-Cousar, Carlena Sax, NP as Nurse Practitioner (Nurse Practitioner)  DIAGNOSIS: No diagnosis found.  SUMMARY OF ONCOLOGIC HISTORY: Oncology History  Malignant neoplasm of upper-outer quadrant of left breast in female, estrogen receptor positive ([place])  01/18/2019 Initial Diagnosis   Patient palpated a left breast lump for 2 weeks. Mammogram showed a 2.3cm left breast mass at the 12 o'clock position, with 1 enlarged left axillary lymph node measuring 3.5cm. 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   01/23/2019 Cancer Staging   Staging form: Breast, AJCC 8th Edition - Clinical stage from 01/23/2019: Stage IIA (cT2, cN1(f), cM0, G2, ER+, PR-, HER2+)   02/01/2019 Genetic Testing   Negative genetic testing. No pathogenic variants identified on the [facility] Common Hereditary Cancers Panel. The Common Hereditary Cancers Panel offered by [facility] includes sequencing and/or deletion duplication testing of the following 48 genes: APC, ATM, AXIN2, BARD1, BMPR1A, BRCA1, BRCA2, BRIP1, CDH1, CDKN2A (p14ARF), CDKN2A (p16INK4a), CKD4, CHEK2, CTNNA1, DICER1, EPCAM (Deletion/duplication testing only), GREM1 (promoter region deletion/duplication testing only), KIT, MEN1, MLH1, MSH2, MSH3, MSH6, MUTYH, NBN, NF1, NHTL1, PALB2, PDGFRA, PMS2, POLD1, POLE, PTEN, RAD50, RAD51C, RAD51D, RNF43, SDHB, SDHC, SDHD, SMAD4, SMARCA4. STK11, TP53, TSC1, TSC2, and VHL.  The following genes were evaluated for sequence changes only: SDHA and HOXB13 c.251G>A variant only. The report date is 02/01/2019.    02/13/2019 - 05/28/2019 Chemotherapy    dexamethasone (DECADRON) 4 MG tablet, 4 mg (100 % of original dose 4 mg), Oral, Daily, 1 of 1 cycle, Start date: 01/23/2019, End date: 05/28/2019. Dose modification: 4 mg (original dose 4 mg, Cycle 0)  palonosetron (ALOXI) injection 0.25 mg, 0.25 mg, Intravenous,  Once, 6 of 6 cycles. Administration: 0.25 mg (02/13/2019), 0.25 mg (03/04/2019), 0.25 mg (05/07/2019), 0.25 mg (05/28/2019), 0.25 mg (03/26/2019), 0.25 mg (04/18/2019)  pegfilgrastim-jmdb (FULPHILA) injection 6 mg, 6 mg, Subcutaneous,  Once, 6 of 6 cycles. Administration: 6 mg (02/15/2019), 6 mg (03/06/2019), 6 mg (05/09/2019), 6 mg (05/30/2019), 6 mg (03/28/2019), 6 mg (04/20/2019)  CARBOplatin (PARAPLATIN) 700 mg in sodium chloride 0.9 % 250 mL chemo infusion, 700 mg (100 % of original dose 700 mg), Intravenous,  Once, 6 of 6 cycles. Dose modification: 700 mg (original dose 700 mg, Cycle 1). Administration: 700 mg (02/13/2019), 700 mg (03/04/2019), 700 mg (05/07/2019), 700 mg (05/28/2019), 700 mg (03/26/2019), 700 mg (04/18/2019)  DOCEtaxel (TAXOTERE) 140 mg in sodium chloride 0.9 % 250 mL chemo infusion, 75 mg/m2 = 140 mg, Intravenous,  Once, 6 of 6 cycles. Dose modification: 65 mg/m2 (original dose 75 mg/m2, Cycle 5, Reason: Dose not tolerated). Administration: 140 mg (02/13/2019), 140 mg (03/04/2019), 120 mg (05/07/2019), 120 mg (05/28/2019), 140 mg (03/26/2019), 140 mg (04/18/2019)  pertuzumab (PERJETA) 420 mg in sodium chloride 0.9 % 250 mL chemo infusion, 420 mg (100 % of original dose 420 mg), Intravenous, Once, 3 of 3 cycles. Dose modification: 420 mg (original dose 420 mg, Cycle 1, Reason: Provider Judgment). Administration: 420 mg (02/13/2019), 420 mg (03/04/2019), 420 mg (03/26/2019)  fosaprepitant (EMEND) 150 mg  dexamethasone (DECADRON) 12 mg in sodium chloride 0.9 % 145 mL IVPB, , Intravenous,  Once, 6 of 6 cycles. Administration:  (02/13/2019),  (03/04/2019),  (05/07/2019),  (  05/28/2019),  (03/26/2019),  (04/18/2019)  trastuzumab-dkst (OGIVRI) 651 mg in  sodium chloride 0.9 % 250 mL chemo infusion, 8 mg/kg = 651 mg, Intravenous,  Once, 6 of 6 cycles. Administration: 651 mg (02/13/2019), 483 mg (03/04/2019), 483 mg (05/07/2019), 483 mg (05/28/2019), 483 mg (03/26/2019), 483 mg (04/18/2019).   06/13/2019 Surgery   Left lumpectomy ([facility]) [phone]): no residual carcinoma, 2/3 left axillary lymph nodes positive for carcinoma. Lymphovascular space invasion was present.   06/20/2019 Cancer Staging   Staging form: Breast, AJCC 8th Edition - Pathologic stage from 06/20/2019: No Stage Recommended (ypT0, pN1, cM0, ER+, PR-, HER2-)   07/11/2019 Surgery   Axillary lymph node dissection ([facility]) ([id number]): 3/3 lymph nodes positive for cancer   08/19/2019 - 10/07/2019 Radiation Therapy   The patient initially received a dose of 50.4 Gy in 28 fractions to the breast and SCLV region using a 4-field approach. This was delivered using a 3-D conformal technique. The patient then received a boost to the seroma. This delivered an additional 10 Gy in 5 fractions using a 3-field photon boost technique. The total dose was 60.4 Gy.   09/2019 - 09/2029 Anti-estrogen oral therapy   Tamoxifen   04/15/2021 -  Chemotherapy   Patient is on Treatment Plan : BREAST Paclitaxel-Albumin (Abraxane) (100) D1,8,15 + Pembrolizumab (200) every 3 weeks, q28d     Liver metastases ([place])  04/19/2021 Initial Diagnosis   Liver metastases ([place])   04/19/2021 Cancer Staging   Staging form: Liver, AJCC 8th Edition - Clinical: Stage IVB (cM1) - Signed by Gardenia Phlegm, NP on 04/19/2021      CHIEF COMPLIANT: Follow-up of metastatic triple negative breast cancer   INTERVAL HISTORY: Paige Perry is a 46 y.o. with above-mentioned history of metastatic triple negative breast cancer, currently on chemotherapy with Abraxane and Pembrolizumab. She presents to the clinic today for follow-up.    ALLERGIES:  is allergic to latex, other, adhesive [tape], and tapentadol.  MEDICATIONS:   Current Outpatient Medications  Medication Sig Dispense Refill   dexamethasone (DECADRON) 4 MG tablet Take 1 tablet (4 mg total) by mouth 2 (two) times daily with a meal. 60 tablet 1   fentaNYL (DURAGESIC) 25 MCG/HR Place 1 patch onto the skin every 3 (three) days. 10 patch 0   fluconazole (DIFLUCAN) 100 MG tablet Take 1 tablet (100 mg total) by mouth daily. 7 tablet 0   lidocaine-prilocaine (EMLA) cream Apply to affected area once 30 g 3   nystatin (MYCOSTATIN) 100000 UNIT/ML suspension Take 5 mLs (500,000 Units total) by mouth 4 (four) times daily. 60 mL 0   ondansetron (ZOFRAN) 8 MG tablet Take 1 tablet (8 mg total) by mouth 2 (two) times daily as needed (Nausea or vomiting). 30 tablet 1   Oxycodone HCl 10 MG TABS Take 1 tablet (10 mg total) by mouth 4 (four) times daily as needed. 90 tablet 0   prochlorperazine (COMPAZINE) 10 MG tablet Take 1 tablet (10 mg total) by mouth every 6 (six) hours as needed (Nausea or vomiting). 30 tablet 1   No current facility-administered medications for this visit.    PHYSICAL EXAMINATION: ECOG PERFORMANCE STATUS: {CHL ONC ECOG [phone]}  There were no vitals filed for this visit. There were no vitals filed for this visit.  BREAST:*** No palpable masses or nodules in either right or left breasts. No palpable axillary supraclavicular or infraclavicular adenopathy no breast tenderness or nipple discharge. (exam performed in the presence of a chaperone)  LABORATORY DATA:  I have  reviewed the data as listed CMP Latest Ref Rng & Units 04/21/2021 04/19/2021 04/15/2021  Glucose 70 - 99 mg/dL 88 104(H) 101(H)  BUN 6 - 20 mg/dL _0 Creatinine 0.44 - 1.00 mg/dL 0.38(L) <0.30(L) 0.53  Sodium 135 - 145 mmol/L 125(L) 121(L) 119(LL)  Potassium 3.5 - 5.1 mmol/L 2.6(LL) 3.6 3.6  Chloride 98 - 111 mmol/L 90(L) 84(L) 84(L)  CO2 22 - 32 mmol/L _1 Calcium 8.9 - 10.3 mg/dL 6.8(L) 7.9(L) 8.3(L)  Total Protein 6.5 - 8.1 g/dL 4.4(L) 6.6 6.4(L)  Total  Bilirubin 0.3 - 1.2 mg/dL 18.6(HH) 21.0(HH) 17.7(HH)  Alkaline Phos 38 - 126 U/L 1,194(H) 950(H) 820(H)  AST 15 - 41 U/L 255(HH) 412(HH) 262(HH)  ALT 0 - 44 U/L 184(H) 245(H) 209(H)    Lab Results  Component Value Date   WBC 3.6 (L) 04/19/2021   HGB 9.7 (L) 04/19/2021   HCT 28.7 (L) 04/19/2021   MCV 85.4 04/19/2021   PLT 197 04/19/2021   NEUTROABS 3.2 04/19/2021    ASSESSMENT & PLAN:  No problem-specific Assessment & Plan notes found for this encounter.    No orders of the defined types were placed in this encounter.  The patient has a good understanding of the overall plan. she agrees with it. she will call with any problems that may develop before the next visit here.  Total time spent: *** mins including face to face time and time spent for planning, charting and coordination of care  Rulon Eisenmenger, MD, MPH 04/21/2021  I, Thana Ates, am acting as scribe for Dr. Nicholas Lose.  {insert scribe attestation}

## 2021-04-21 NOTE — Assessment & Plan Note (Addendum)
01/18/2019:Patient palpated a left breast lump for 2 weeks. Mammogram showed a 2.3cm left breast mass at the 12 o'clock position, with 1 enlarged left axillary lymph node measuring 3.5cm. 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 T2N1 stage IIa clinical stage  Treatment plan: 1. Neoadjuvant chemotherapy with TCH Perjeta 6 cycles (maintenance anti-HER-2 therapy is not being given because the initial pathology was reread as HER-2 negative.  The final pathology on the lumpectomy was also HER-2 negative) ER 60% week, PR negative 2. 06/13/2019 left lumpectomy ([facility]): no residual carcinoma, 2/3 left axillary lymph nodes positive for carcinoma. 07/15/2019: ALN D: 3/3 lymph nodes positive.  No extranodal extension 3. Followed by adjuvant radiation therapy started 08/20/2019 No adverse effects from participation in the neuropathy clinical trial SWOG 1714 CT Chest 01/25/21: No PE, Lymphadenopathy in mediastinum and hilar regions, Mass vs LN in axillary tain METASTATIC progression: 03/23/2021: Widespread metastatic disease including the liver thoracoabdominal nodes and left chest wall/breast.  Gallbladder wall thickening, small right pleural effusion. 03/25/2021: Diagnostic mammogram showing several left breast masses concerning for malignancy the largest being 4.6 cm.  --------------------------------------------------------------------------------------------------------------------------------------------------------------------------------------------------------------------------------------  1.  Metastatic breast cancer: She had a T bili of 18.6 today.  This is down from prior.  We are monitoring her daily with fluids and she will see Dr. Lindi Adie tomorrow.  2.  Cancer related pain: She is working with palliative care on this and it is controlled with the fentanyl patch  and oxycodone.  She tells me her pain is a 0.  I touched base with Lexine Baton in palliative care today about Catherina and she is going to see her today while she is in infusion.  3.  Thrush: This appears to now be pharyngeal as well.  I have prescribed Diflucan 100 mg p.o. daily x7 days.  This should also help with the vaginal discharge.  4.  Orthostatic hypotension: She will continue with IV fluids every day.  She does have hypokalemia today so we will give potassium today and tomorrow and check labs every day as well.    Initially she did not want to return tomorrow for fluids and potassium, however when I went to discuss that with her, she was disappointed that she didn't have a hospital bed yet.  She opted to go home and return tomorrow for labs and f/u.    She will return as indicated above and knows to follow-up for any questions.

## 2021-04-21 NOTE — Progress Notes (Signed)
[facility] Follow up:    [name], NP [place] [address]   DIAGNOSIS:  Cancer Staging  Liver metastases [facility]) Staging form: Liver, AJCC 8th Edition - Clinical: Stage IVB (cM1) - Signed by Gardenia Phlegm, NP on 04/19/2021  Malignant neoplasm of upper-outer quadrant of left breast in female, estrogen receptor positive ([place]) Staging form: Breast, AJCC 8th Edition - Clinical stage from 01/23/2019: Stage IIA (cT2, cN1(f), cM0, G2, ER+, PR-, HER2+) - Signed by Nicholas Lose, MD on 01/23/2019 Stage prefix: Initial diagnosis Method of lymph node assessment: Core biopsy Histologic grading system: 3 grade system - Pathologic stage from 06/20/2019: No Stage Recommended (ypT0, pN1, cM0, ER+, PR-, HER2-) - Signed by Nicholas Lose, MD on 06/20/2019 Stage prefix: Post-therapy   SUMMARY OF ONCOLOGIC HISTORY: Oncology History  Malignant neoplasm of upper-outer quadrant of left breast in female, estrogen receptor positive ([place])  01/18/2019 Initial Diagnosis   Patient palpated a left breast lump for 2 weeks. Mammogram showed a 2.3cm left breast mass at the 12 o'clock position, with 1 enlarged left axillary lymph node measuring 3.5cm. 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   01/23/2019 Cancer Staging   Staging form: Breast, AJCC 8th Edition - Clinical stage from 01/23/2019: Stage IIA (cT2, cN1(f), cM0, G2, ER+, PR-, HER2+)   02/01/2019 Genetic Testing   Negative genetic testing. No pathogenic variants identified on the [facility] Common Hereditary Cancers Panel. The Common Hereditary Cancers Panel offered by [facility] includes sequencing and/or deletion duplication testing of the following 48 genes: APC, ATM, AXIN2, BARD1, BMPR1A, BRCA1, BRCA2, BRIP1, CDH1, CDKN2A (p14ARF), CDKN2A (p16INK4a), CKD4, CHEK2, CTNNA1, DICER1, EPCAM (Deletion/duplication testing only), GREM1 (promoter region deletion/duplication testing  only), KIT, MEN1, MLH1, MSH2, MSH3, MSH6, MUTYH, NBN, NF1, NHTL1, PALB2, PDGFRA, PMS2, POLD1, POLE, PTEN, RAD50, RAD51C, RAD51D, RNF43, SDHB, SDHC, SDHD, SMAD4, SMARCA4. STK11, TP53, TSC1, TSC2, and VHL.  The following genes were evaluated for sequence changes only: SDHA and HOXB13 c.251G>A variant only. The report date is 02/01/2019.    02/13/2019 - 05/28/2019 Chemotherapy   dexamethasone (DECADRON) 4 MG tablet, 4 mg (100 % of original dose 4 mg), Oral, Daily, 1 of 1 cycle, Start date: 01/23/2019, End date: 05/28/2019. Dose modification: 4 mg (original dose 4 mg, Cycle 0)  palonosetron (ALOXI) injection 0.25 mg, 0.25 mg, Intravenous,  Once, 6 of 6 cycles. Administration: 0.25 mg (02/13/2019), 0.25 mg (03/04/2019), 0.25 mg (05/07/2019), 0.25 mg (05/28/2019), 0.25 mg (03/26/2019), 0.25 mg (04/18/2019)  pegfilgrastim-jmdb (FULPHILA) injection 6 mg, 6 mg, Subcutaneous,  Once, 6 of 6 cycles. Administration: 6 mg (02/15/2019), 6 mg (03/06/2019), 6 mg (05/09/2019), 6 mg (05/30/2019), 6 mg (03/28/2019), 6 mg (04/20/2019)  CARBOplatin (PARAPLATIN) 700 mg in sodium chloride 0.9 % 250 mL chemo infusion, 700 mg (100 % of original dose 700 mg), Intravenous,  Once, 6 of 6 cycles. Dose modification: 700 mg (original dose 700 mg, Cycle 1). Administration: 700 mg (02/13/2019), 700 mg (03/04/2019), 700 mg (05/07/2019), 700 mg (05/28/2019), 700 mg (03/26/2019), 700 mg (04/18/2019)  DOCEtaxel (TAXOTERE) 140 mg in sodium chloride 0.9 % 250 mL chemo infusion, 75 mg/m2 = 140 mg, Intravenous,  Once, 6 of 6 cycles. Dose modification: 65 mg/m2 (original dose 75 mg/m2, Cycle 5, Reason: Dose not tolerated). Administration: 140 mg (02/13/2019), 140 mg (03/04/2019), 120 mg (05/07/2019), 120 mg (05/28/2019), 140 mg (03/26/2019), 140 mg (04/18/2019)  pertuzumab (PERJETA) 420 mg in sodium chloride 0.9 % 250 mL chemo infusion, 420 mg (100 %  of original dose 420 mg), Intravenous, Once, 3 of 3 cycles. Dose modification: 420 mg (original dose 420 mg, Cycle 1,  Reason: Provider Judgment). Administration: 420 mg (02/13/2019), 420 mg (03/04/2019), 420 mg (03/26/2019)  fosaprepitant (EMEND) 150 mg  dexamethasone (DECADRON) 12 mg in sodium chloride 0.9 % 145 mL IVPB, , Intravenous,  Once, 6 of 6 cycles. Administration:  (02/13/2019),  (03/04/2019),  (05/07/2019),  (05/28/2019),  (03/26/2019),  (04/18/2019)  trastuzumab-dkst (OGIVRI) 651 mg in sodium chloride 0.9 % 250 mL chemo infusion, 8 mg/kg = 651 mg, Intravenous,  Once, 6 of 6 cycles. Administration: 651 mg (02/13/2019), 483 mg (03/04/2019), 483 mg (05/07/2019), 483 mg (05/28/2019), 483 mg (03/26/2019), 483 mg (04/18/2019).   06/13/2019 Surgery   Left lumpectomy ([facility]) [phone]): no residual carcinoma, 2/3 left axillary lymph nodes positive for carcinoma. Lymphovascular space invasion was present.   06/20/2019 Cancer Staging   Staging form: Breast, AJCC 8th Edition - Pathologic stage from 06/20/2019: No Stage Recommended (ypT0, pN1, cM0, ER+, PR-, HER2-)   07/11/2019 Surgery   Axillary lymph node dissection ([facility]) ([id number]): 3/3 lymph nodes positive for cancer   08/19/2019 - 10/07/2019 Radiation Therapy   The patient initially received a dose of 50.4 Gy in 28 fractions to the breast and SCLV region using a 4-field approach. This was delivered using a 3-D conformal technique. The patient then received a boost to the seroma. This delivered an additional 10 Gy in 5 fractions using a 3-field photon boost technique. The total dose was 60.4 Gy.   09/2019 - 09/2029 Anti-estrogen oral therapy   Tamoxifen   04/15/2021 -  Chemotherapy   Patient is on Treatment Plan : BREAST Paclitaxel-Albumin (Abraxane) (100) D1,8,15 + Pembrolizumab (200) every 3 weeks, q28d     Liver metastases ([place])  04/19/2021 Initial Diagnosis   Liver metastases ([place])   04/19/2021 Cancer Staging   Staging form: Liver, AJCC 8th Edition - Clinical: Stage IVB (cM1) - Signed by Gardenia Phlegm, NP on 04/19/2021      CURRENT  THERAPY: Evangeline Dakin  INTERVAL HISTORY: Altamease Oiler 46 y.o. female returns for evaluation of her metastatic breast cancer.  She was seen in the infusion room today.  She has continued to have sore throat and the nystatin that she is taking she is spitting out.  She wonders if it is related.  She is also noted a vaginal discharge.  She has decreased pain.  She tells me that today her pain is a level 0.  She does note however that she is having decreased appetite.  She continues to be fatigued.     Patient Active Problem List   Diagnosis Date Noted   Hypokalemia 04/21/2021   Liver metastases ([place]) 04/19/2021   Port-A-Cath in place 03/26/2019   Genetic testing 02/01/2019   Family history of prostate cancer    Family history of throat cancer    Family history of breast cancer    Malignant neoplasm of upper-outer quadrant of left breast in female, estrogen receptor positive ([place]) 01/18/2019   Other allergic rhinitis 12/03/2014    is allergic to latex, other, adhesive [tape], and tapentadol.  MEDICAL HISTORY: Past Medical History:  Diagnosis Date   Asthma    Cancer ([place])    breast   Family history of breast cancer    Family history of breast cancer    Family history of prostate cancer    Family history of throat cancer    Hypercholesteremia    Hypokalemia 04/21/2021   PONV (  postoperative nausea and vomiting)     SURGICAL HISTORY: Past Surgical History:  Procedure Laterality Date   AXILLARY LYMPH NODE DISSECTION Left 07/11/2019   Procedure: LEFT AXILLARY LYMPH NODE DISSECTION;  Surgeon: Donnie Mesa, MD;  Location: [facility];  Service: General;  Laterality: Left;   BREAST LUMPECTOMY WITH RADIOACTIVE SEED AND AXILLARY LYMPH NODE DISSECTION Left 06/13/2019   Procedure: LEFT BREAST LUMPECTOMY WITH RADIOACTIVE SEED AND AXILLARY TARGETED LYMPH NODE DISSECTION;  Surgeon: Donnie Mesa, MD;  Location: [facility];  Service: General;  Laterality:  Left;   FINGER SURGERY     PORTACATH PLACEMENT Right 02/05/2019   Procedure: INSERTION PORT-A-CATH WITH ULTRASOUND;  Surgeon: Donnie Mesa, MD;  Location: [facility];  Service: General;  Laterality: Right;   PORTACATH PLACEMENT Right 04/14/2021   Procedure: INSERTION PORT-A-CATH;  Surgeon: Donnie Mesa, MD;  Location: [facility];  Service: General;  Laterality: Right;   WISDOM TOOTH EXTRACTION     WOUND EXPLORATION Right 06/13/2019   Procedure: RIGHT CHEST WOUND EXPLORATION;  Surgeon: Donnie Mesa, MD;  Location: [facility];  Service: General;  Laterality: Right;    SOCIAL HISTORY: Social History   Socioeconomic History   Marital status: Single    Spouse name: Not on file   Number of children: Not on file   Years of education: Not on file   Highest education level: Not on file  Occupational History   Not on file  Tobacco Use   Smoking status: Never   Smokeless tobacco: Never  Vaping Use   Vaping Use: Never used  Substance and Sexual Activity   Alcohol use: Yes    Comment: occasionally   Drug use: No   Sexual activity: Yes    Birth control/protection: None  Other Topics Concern   Not on file  Social History Narrative   Not on file   Social Determinants of Health   Financial Resource Strain: Not on file  Food Insecurity: Not on file  Transportation Needs: Not on file  Physical Activity: Not on file  Stress: Not on file  Social Connections: Not on file  Intimate Partner Violence: Not on file    FAMILY HISTORY: Family History  Problem Relation Age of Onset   Hypertension Mother    Heart failure Mother    Breast cancer Maternal Aunt        dx 72s   Breast cancer Paternal Aunt    Prostate cancer Father 27   Throat cancer Cousin     Review of Systems  Constitutional:  Positive for appetite change and fatigue. Negative for fever.  HENT:   Positive for sore throat and trouble swallowing. Negative for hearing loss  and lump/mass.   Eyes:  Negative for eye problems and icterus.  Respiratory:  Negative for chest tightness, cough and shortness of breath.   Cardiovascular:  Negative for chest pain, leg swelling and palpitations.  Gastrointestinal:  Negative for abdominal distention, abdominal pain, blood in stool, constipation, diarrhea, nausea and vomiting.  Endocrine: Negative for hot flashes.  Genitourinary:  Negative for difficulty urinating.   Musculoskeletal:  Negative for arthralgias.  Skin:  Negative for itching and rash.  Neurological:  Negative for dizziness, extremity weakness, headaches and numbness.  Hematological:  Negative for adenopathy. Does not bruise/bleed easily.  Psychiatric/Behavioral:  Negative for depression. The patient is not nervous/anxious.      PHYSICAL EXAMINATION  ECOG PERFORMANCE STATUS: 3 - Symptomatic, >50% confined to bed See CHL  for vitals   Physical Exam Constitutional:      General: She is not in acute distress.    Appearance: Normal appearance. She is not toxic-appearing.  HENT:     Head: Normocephalic and atraumatic.     Mouth/Throat:     Mouth: Mucous membranes are moist.     Pharynx: No oropharyngeal exudate.  Eyes:     General: Scleral icterus present.  Cardiovascular:     Rate and Rhythm: Normal rate and regular rhythm.     Pulses: Normal pulses.     Heart sounds: Normal heart sounds.  Pulmonary:     Effort: Pulmonary effort is normal.     Breath sounds: Normal breath sounds.  Abdominal:     General: Abdomen is flat. Bowel sounds are normal. There is no distension.     Palpations: Abdomen is soft.     Tenderness: There is no abdominal tenderness.  Musculoskeletal:        General: No swelling.     Cervical back: Neck supple. No rigidity.  Lymphadenopathy:     Cervical: No cervical adenopathy.  Skin:    General: Skin is warm and dry.     Coloration: Skin is jaundiced.     Findings: No rash.  Neurological:     General: No focal deficit  present.     Mental Status: She is alert.  Psychiatric:        Mood and Affect: Mood normal.        Behavior: Behavior normal.    LABORATORY DATA:  CBC    Component Value Date/Time   WBC 3.6 (L) 04/19/2021 0857   WBC 5.8 03/22/2021 0955   RBC 3.36 (L) 04/19/2021 0857   HGB 9.7 (L) 04/19/2021 0857   HCT 28.7 (L) 04/19/2021 0857   PLT 197 04/19/2021 0857   MCV 85.4 04/19/2021 0857   MCH 28.9 04/19/2021 0857   MCHC 33.8 04/19/2021 0857   RDW 15.7 (H) 04/19/2021 0857   LYMPHSABS 0.4 (L) 04/19/2021 0857   MONOABS 0.0 (L) 04/19/2021 0857   EOSABS 0.0 04/19/2021 0857   BASOSABS 0.0 04/19/2021 0857    CMP     Component Value Date/Time   NA 125 (L) 04/21/2021 0858   K 2.6 (LL) 04/21/2021 0858   CL 90 (L) 04/21/2021 0858   CO2 27 04/21/2021 0858   GLUCOSE 88 04/21/2021 0858   GLUCOSE 87 03/31/2006 1359   BUN 6 04/21/2021 0858   CREATININE 0.38 (L) 04/21/2021 0858   CREATININE <0.30 (L) 04/19/2021 0857   CALCIUM 6.8 (L) 04/21/2021 0858   PROT 4.4 (L) 04/21/2021 0858   ALBUMIN 2.0 (L) 04/21/2021 0858   AST 255 (HH) 04/21/2021 0858   AST 412 (HH) 04/19/2021 0857   ALT 184 (H) 04/21/2021 0858   ALT 245 (H) 04/19/2021 0857   ALKPHOS 1,194 (H) 04/21/2021 0858   BILITOT 18.6 (HH) 04/21/2021 0858   BILITOT 21.0 (HH) 04/19/2021 0857   GFRNONAA >60 04/21/2021 0858   GFRNONAA NOT CALCULATED 04/19/2021 0857   GFRAA >60 05/28/2019 0845     ASSESSMENT and THERAPY PLAN:   Malignant neoplasm of upper-outer quadrant of left breast in female, estrogen receptor positive ([place]) 01/18/2019: Patient palpated a left breast lump for 2 weeks. Mammogram showed a 2.3cm left breast mass at the 12 o'clock position, with 1 enlarged left axillary lymph node measuring 3.5cm. 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  ER positive, PR negative, HER-2 positive (initial IHC 3+) accidentally  FISH was done which was negative. T2N1 stage IIa clinical stage   Treatment  plan: 1. Neoadjuvant chemotherapy with TCH [place] 6 cycles (maintenance anti-HER-2 therapy is not being given because the initial pathology was reread as HER-2 negative.  The final pathology on the lumpectomy was also HER-2 negative) ER 60% week, PR negative 2. 06/13/2019 left lumpectomy ([facility]): no residual carcinoma, 2/3 left axillary lymph nodes positive for carcinoma. 07/15/2019: ALN D: 3/3 lymph nodes positive.  No extranodal extension 3. Followed by adjuvant radiation therapy started 08/20/2019 No adverse effects from participation in the neuropathy clinical trial SWOG 1714 CT Chest 01/25/21: No PE, Lymphadenopathy in mediastinum and hilar regions, Mass vs LN in axillary tain METASTATIC progression: 03/23/2021: Widespread metastatic disease including the liver thoracoabdominal nodes and left chest wall/breast.  Gallbladder wall thickening, small right pleural effusion. 03/25/2021: Diagnostic mammogram showing several left breast masses concerning for malignancy the largest being 4.6 cm.  --------------------------------------------------------------------------------------------------------------------------------------------------------------------------------------------------------------------------------------  1.  Metastatic breast cancer: She had a T bili of 18.6 today.  This is down from prior.  We are monitoring her daily with fluids and she will see Dr. Lindi Adie tomorrow.  2.  Cancer related pain: She is working with palliative care on this and it is controlled with the fentanyl patch and oxycodone.  She tells me her pain is a 0.  I touched base with Lexine Baton in palliative care today about Braelynne and she is going to see her today while she is in infusion.  3.  Thrush: This appears to now be pharyngeal as well.  I have prescribed Diflucan 100 mg p.o. daily x7 days.  This should also help with the vaginal discharge.  4.  Orthostatic hypotension: She will continue with IV fluids every day.   She does have hypokalemia today so we will give potassium today and tomorrow and check labs every day as well.    Initially she did not want to return tomorrow for fluids and potassium, however when I went to discuss that with her, she was disappointed that she didn't have a hospital bed yet.  She opted to go home and return tomorrow for labs and f/u.    She will return as indicated above and knows to follow-up for any questions.      All questions were answered. The patient knows to call the clinic with any problems, questions or concerns. We can certainly see the patient much sooner if necessary.  Total encounter time: 45 minutes in face-to-face visit time, chart review, lab review, order entry, care coordination, discussion with nursing staff, discussion with Dr. Lindi Adie, and documentation of the encounter.  Wilber Bihari, NP 04/21/21 7:43 PM Medical Oncology and Hematology [facility] [place], [place] [address] Tel. [phone]    Fax. [phone]  *Total Encounter Time as defined by the Centers for Medicare and Medicaid Services includes, in addition to the face-to-face time of a patient visit (documented in the note above) non-face-to-face time: obtaining and reviewing outside history, ordering and reviewing medications, tests or procedures, care coordination (communications with other health care professionals or caregivers) and documentation in the medical record.

## 2021-04-21 NOTE — Telephone Encounter (Signed)
CRITICAL VALUE STICKER  CRITICAL VALUE: Potassium-2.6, AST-255, Total Bili-18.6  RECEIVER (on-site recipient of call): Kabrea Seeney P. LPN  DATE & TIME NOTIFIED: 04/21/2021 9:59 am  MESSENGER (representative from lab): Ulice Dash  MD NOTIFIED: Wilber Bihari, NP  TIME OF NOTIFICATION: 10:00am

## 2021-04-21 NOTE — Patient Instructions (Signed)

## 2021-04-22 ENCOUNTER — Inpatient Hospital Stay (HOSPITAL_BASED_OUTPATIENT_CLINIC_OR_DEPARTMENT_OTHER): Payer: BC Managed Care – PPO | Admitting: Hematology and Oncology

## 2021-04-22 ENCOUNTER — Inpatient Hospital Stay: Payer: BC Managed Care – PPO

## 2021-04-22 ENCOUNTER — Encounter: Payer: Self-pay | Admitting: *Deleted

## 2021-04-22 ENCOUNTER — Inpatient Hospital Stay (HOSPITAL_COMMUNITY)
Admission: AD | Admit: 2021-04-22 | Discharge: 2021-05-01 | DRG: 809 | Disposition: A | Discharge status: Home Health | Payer: BC Managed Care – PPO | Source: Ambulatory Visit | Attending: Internal Medicine | Admitting: Internal Medicine

## 2021-04-22 ENCOUNTER — Inpatient Hospital Stay (HOSPITAL_COMMUNITY): Payer: BC Managed Care – PPO

## 2021-04-22 VITALS — BP 110/72 | HR 145 | Temp 100.4°F | Resp 22

## 2021-04-22 DIAGNOSIS — D630 Anemia in neoplastic disease: Secondary | ICD-10-CM | POA: Diagnosis present

## 2021-04-22 DIAGNOSIS — Z95828 Presence of other vascular implants and grafts: Secondary | ICD-10-CM

## 2021-04-22 DIAGNOSIS — Z6831 Body mass index (BMI) 31.0-31.9, adult: Secondary | ICD-10-CM

## 2021-04-22 DIAGNOSIS — C50412 Malignant neoplasm of upper-outer quadrant of left female breast: Secondary | ICD-10-CM | POA: Diagnosis present

## 2021-04-22 DIAGNOSIS — E8809 Other disorders of plasma-protein metabolism, not elsewhere classified: Secondary | ICD-10-CM | POA: Diagnosis present

## 2021-04-22 DIAGNOSIS — E871 Hypo-osmolality and hyponatremia: Secondary | ICD-10-CM | POA: Diagnosis present

## 2021-04-22 DIAGNOSIS — R5081 Fever presenting with conditions classified elsewhere: Secondary | ICD-10-CM | POA: Diagnosis present

## 2021-04-22 DIAGNOSIS — L8990 Pressure ulcer of unspecified site, unspecified stage: Secondary | ICD-10-CM | POA: Insufficient documentation

## 2021-04-22 DIAGNOSIS — J45909 Unspecified asthma, uncomplicated: Secondary | ICD-10-CM | POA: Diagnosis present

## 2021-04-22 DIAGNOSIS — T361X5A Adverse effect of cephalosporins and other beta-lactam antibiotics, initial encounter: Secondary | ICD-10-CM | POA: Diagnosis not present

## 2021-04-22 DIAGNOSIS — Z171 Estrogen receptor negative status [ER-]: Secondary | ICD-10-CM

## 2021-04-22 DIAGNOSIS — Z91018 Allergy to other foods: Secondary | ICD-10-CM

## 2021-04-22 DIAGNOSIS — K121 Other forms of stomatitis: Secondary | ICD-10-CM | POA: Diagnosis present

## 2021-04-22 DIAGNOSIS — Z803 Family history of malignant neoplasm of breast: Secondary | ICD-10-CM

## 2021-04-22 DIAGNOSIS — D709 Neutropenia, unspecified: Secondary | ICD-10-CM

## 2021-04-22 DIAGNOSIS — E876 Hypokalemia: Secondary | ICD-10-CM | POA: Diagnosis present

## 2021-04-22 DIAGNOSIS — Z20822 Contact with and (suspected) exposure to covid-19: Secondary | ICD-10-CM | POA: Diagnosis present

## 2021-04-22 DIAGNOSIS — Z170 Estrogen receptor positive status [ER+]: Secondary | ICD-10-CM

## 2021-04-22 DIAGNOSIS — R627 Adult failure to thrive: Secondary | ICD-10-CM | POA: Diagnosis present

## 2021-04-22 DIAGNOSIS — G893 Neoplasm related pain (acute) (chronic): Secondary | ICD-10-CM | POA: Diagnosis present

## 2021-04-22 DIAGNOSIS — Z91040 Latex allergy status: Secondary | ICD-10-CM

## 2021-04-22 DIAGNOSIS — L539 Erythematous condition, unspecified: Secondary | ICD-10-CM | POA: Diagnosis not present

## 2021-04-22 DIAGNOSIS — E669 Obesity, unspecified: Secondary | ICD-10-CM | POA: Diagnosis present

## 2021-04-22 DIAGNOSIS — R0602 Shortness of breath: Secondary | ICD-10-CM | POA: Diagnosis not present

## 2021-04-22 DIAGNOSIS — R509 Fever, unspecified: Secondary | ICD-10-CM

## 2021-04-22 DIAGNOSIS — R21 Rash and other nonspecific skin eruption: Secondary | ICD-10-CM | POA: Diagnosis not present

## 2021-04-22 DIAGNOSIS — J90 Pleural effusion, not elsewhere classified: Secondary | ICD-10-CM

## 2021-04-22 DIAGNOSIS — E7800 Pure hypercholesterolemia, unspecified: Secondary | ICD-10-CM | POA: Diagnosis present

## 2021-04-22 DIAGNOSIS — Z515 Encounter for palliative care: Secondary | ICD-10-CM

## 2021-04-22 DIAGNOSIS — Z91048 Other nonmedicinal substance allergy status: Secondary | ICD-10-CM

## 2021-04-22 DIAGNOSIS — Z9889 Other specified postprocedural states: Secondary | ICD-10-CM

## 2021-04-22 DIAGNOSIS — L89152 Pressure ulcer of sacral region, stage 2: Secondary | ICD-10-CM | POA: Diagnosis present

## 2021-04-22 DIAGNOSIS — R0600 Dyspnea, unspecified: Secondary | ICD-10-CM | POA: Diagnosis not present

## 2021-04-22 DIAGNOSIS — D708 Other neutropenia: Principal | ICD-10-CM | POA: Diagnosis present

## 2021-04-22 DIAGNOSIS — E869 Volume depletion, unspecified: Secondary | ICD-10-CM | POA: Diagnosis present

## 2021-04-22 DIAGNOSIS — B370 Candidal stomatitis: Secondary | ICD-10-CM

## 2021-04-22 DIAGNOSIS — C787 Secondary malignant neoplasm of liver and intrahepatic bile duct: Secondary | ICD-10-CM | POA: Diagnosis present

## 2021-04-22 DIAGNOSIS — Z79891 Long term (current) use of opiate analgesic: Secondary | ICD-10-CM

## 2021-04-22 DIAGNOSIS — I959 Hypotension, unspecified: Secondary | ICD-10-CM | POA: Diagnosis present

## 2021-04-22 DIAGNOSIS — K838 Other specified diseases of biliary tract: Secondary | ICD-10-CM | POA: Diagnosis present

## 2021-04-22 DIAGNOSIS — Z888 Allergy status to other drugs, medicaments and biological substances status: Secondary | ICD-10-CM

## 2021-04-22 DIAGNOSIS — Z79899 Other long term (current) drug therapy: Secondary | ICD-10-CM

## 2021-04-22 DIAGNOSIS — Z7189 Other specified counseling: Secondary | ICD-10-CM | POA: Diagnosis not present

## 2021-04-22 LAB — RESP PANEL BY RT-PCR (FLU A&B, COVID) ARPGX2
Influenza A by PCR: NEGATIVE
Influenza B by PCR: NEGATIVE
SARS Coronavirus 2 by RT PCR: NEGATIVE

## 2021-04-22 LAB — CBC WITH DIFFERENTIAL (CANCER CENTER ONLY)
Abs Immature Granulocytes: 0.03 10*3/uL (ref 0.00–0.07)
Basophils Absolute: 0 10*3/uL (ref 0.0–0.1)
Basophils Relative: 0 %
Eosinophils Absolute: 0 10*3/uL (ref 0.0–0.5)
Eosinophils Relative: 0 %
HCT: 22.8 % — ABNORMAL LOW (ref 36.0–46.0)
Hemoglobin: 7.6 g/dL — ABNORMAL LOW (ref 12.0–15.0)
Immature Granulocytes: 4 %
Lymphocytes Relative: 65 %
Lymphs Abs: 0.4 10*3/uL — ABNORMAL LOW (ref 0.7–4.0)
MCH: 28.7 pg (ref 26.0–34.0)
MCHC: 33.3 g/dL (ref 30.0–36.0)
MCV: 86 fL (ref 80.0–100.0)
Monocytes Absolute: 0.2 10*3/uL (ref 0.1–1.0)
Monocytes Relative: 24 %
Neutro Abs: 0.1 10*3/uL — CL (ref 1.7–7.7)
Neutrophils Relative %: 7 %
Platelet Count: 196 10*3/uL (ref 150–400)
RBC: 2.65 MIL/uL — ABNORMAL LOW (ref 3.87–5.11)
RDW: 16.1 % — ABNORMAL HIGH (ref 11.5–15.5)
Smear Review: NORMAL
WBC Count: 0.7 10*3/uL — CL (ref 4.0–10.5)
nRBC: 23.5 % — ABNORMAL HIGH (ref 0.0–0.2)

## 2021-04-22 LAB — CMP (CANCER CENTER ONLY)
ALT: 146 U/L — ABNORMAL HIGH (ref 0–44)
AST: 150 U/L — ABNORMAL HIGH (ref 15–41)
Albumin: 2.3 g/dL — ABNORMAL LOW (ref 3.5–5.0)
Alkaline Phosphatase: 913 U/L — ABNORMAL HIGH (ref 38–126)
Anion gap: 9 (ref 5–15)
BUN: 7 mg/dL (ref 6–20)
CO2: 27 mmol/L (ref 22–32)
Calcium: 7.7 mg/dL — ABNORMAL LOW (ref 8.9–10.3)
Chloride: 90 mmol/L — ABNORMAL LOW (ref 98–111)
Creatinine: 0.43 mg/dL — ABNORMAL LOW (ref 0.44–1.00)
GFR, Estimated: >60 mL/min (ref >60)
Glucose, Bld: 97 mg/dL (ref 70–99)
Potassium: 2.9 mmol/L — ABNORMAL LOW (ref 3.5–5.1)
Sodium: 126 mmol/L — ABNORMAL LOW (ref 135–145)
Total Bilirubin: 13.5 mg/dL (ref 0.3–1.2)
Total Protein: 5.8 g/dL — ABNORMAL LOW (ref 6.5–8.1)

## 2021-04-22 LAB — HIV ANTIBODY (ROUTINE TESTING W REFLEX): HIV Screen 4th Generation wRfx: NONREACTIVE

## 2021-04-22 LAB — MRSA NEXT GEN BY PCR, NASAL: MRSA by PCR Next Gen: NOT DETECTED

## 2021-04-22 LAB — MAGNESIUM: Magnesium: 1.9 mg/dL (ref 1.7–2.4)

## 2021-04-22 IMAGING — DX DG CHEST 1V PORT
1 series · 1 of 1 positions shown · non-contrast
Comparison: Radiograph [DATE], CT [DATE]

CLINICAL DATA: Fever.

EXAM:
PORTABLE CHEST 1 VIEW

[chest ap]
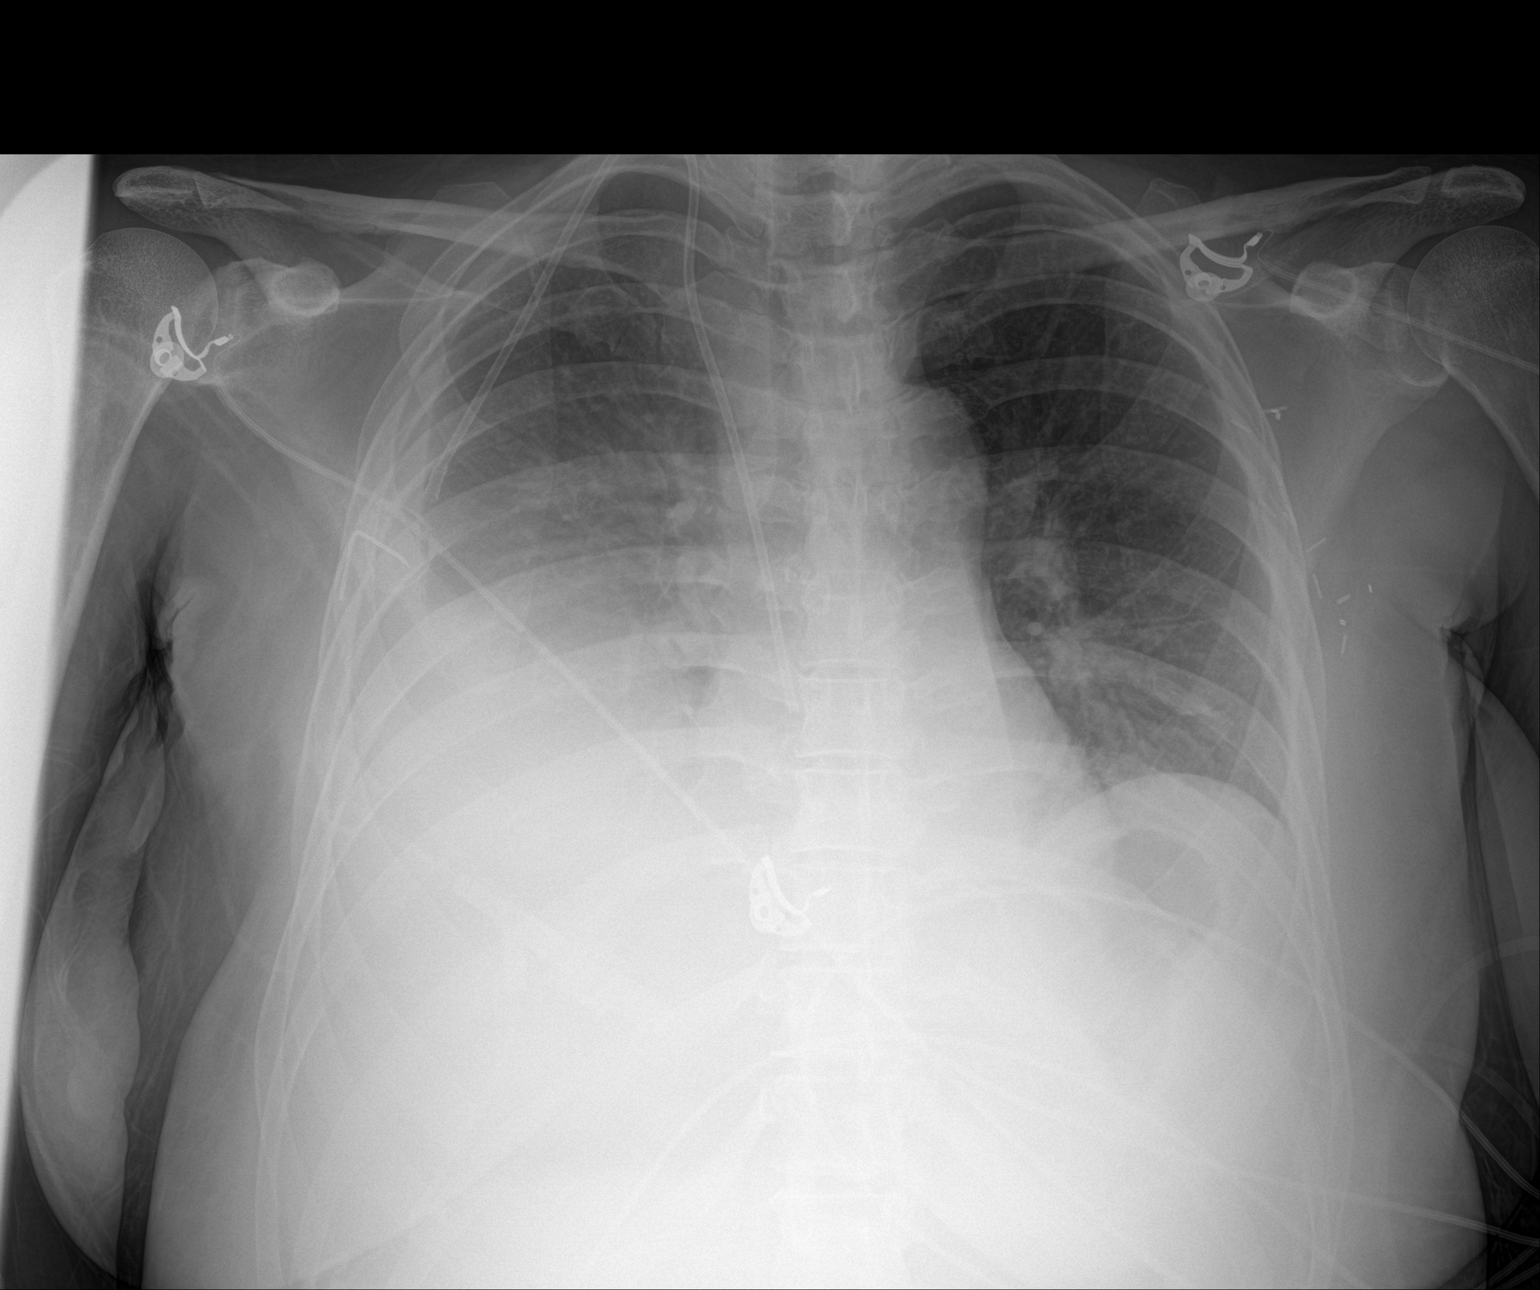

[1 of 1 positions shown; findings below may reference images not displayed]

FINDINGS: Accessed right chest port in place with tip in the SVC. Hazy opacity
throughout the right hemithorax with right pleural effusion,
tracking laterally. This partially obscures assessment of the right
lung. Allowing for this, no focal consolidation to suggest
pneumonia. Mild left basilar atelectasis. Stable heart size and
mediastinal contours. No pneumothorax. Left axillary surgical clips.
IMPRESSION: Moderate right pleural effusion, similar in appearance to prior
exam. Mild left basilar atelectasis.

## 2021-04-22 MED ORDER — FILGRASTIM-AAFI 480 MCG/0.8ML IJ SOSY
480.0000 ug | PREFILLED_SYRINGE | Freq: Once | INTRAMUSCULAR | Status: AC
  Administered 2021-04-22: 480 ug via SUBCUTANEOUS
  Filled 2021-04-22: qty 0.8

## 2021-04-22 MED ORDER — SODIUM CHLORIDE 0.9% FLUSH
3.0000 mL | Freq: Two times a day (BID) | INTRAVENOUS | Status: DC
  Administered 2021-04-22 – 2021-04-30 (×13): 3 mL via INTRAVENOUS

## 2021-04-22 MED ORDER — MAGIC MOUTHWASH
10.0000 mL | Freq: Four times a day (QID) | ORAL | Status: DC | PRN
  Administered 2021-04-25 – 2021-04-30 (×5): 10 mL via ORAL
  Filled 2021-04-22 (×7): qty 10

## 2021-04-22 MED ORDER — FENTANYL 25 MCG/HR TD PT72
1.0000 | MEDICATED_PATCH | TRANSDERMAL | Status: DC
  Administered 2021-04-23 – 2021-04-29 (×3): 1 via TRANSDERMAL
  Filled 2021-04-22 (×5): qty 1

## 2021-04-22 MED ORDER — SODIUM CHLORIDE 0.9 % IV SOLN
2.0000 g | Freq: Three times a day (TID) | INTRAVENOUS | Status: DC
  Administered 2021-04-22 – 2021-04-25 (×8): 2 g via INTRAVENOUS
  Filled 2021-04-22 (×9): qty 2

## 2021-04-22 MED ORDER — ONDANSETRON HCL 4 MG/2ML IJ SOLN: 4.0000 mg | Freq: Four times a day (QID) | INTRAMUSCULAR | Status: DC | PRN

## 2021-04-22 MED ORDER — SODIUM CHLORIDE 0.9% FLUSH
10.0000 mL | Freq: Once | INTRAVENOUS | Status: AC
  Administered 2021-04-22: 10 mL

## 2021-04-22 MED ORDER — SODIUM CHLORIDE 0.9 % IV BOLUS
1000.0000 mL | Freq: Once | INTRAVENOUS | Status: AC
  Administered 2021-04-22: 1000 mL via INTRAVENOUS

## 2021-04-22 MED ORDER — SODIUM CHLORIDE 0.9% FLUSH: 3.0000 mL | INTRAVENOUS | Status: DC | PRN

## 2021-04-22 MED ORDER — SODIUM CHLORIDE 0.9 % IV SOLN: INTRAVENOUS | Status: DC

## 2021-04-22 MED ORDER — ACETAMINOPHEN 650 MG RE SUPP: 650.0000 mg | Freq: Four times a day (QID) | RECTAL | Status: DC | PRN

## 2021-04-22 MED ORDER — ENOXAPARIN SODIUM 40 MG/0.4ML IJ SOSY
40.0000 mg | PREFILLED_SYRINGE | INTRAMUSCULAR | Status: DC
  Administered 2021-04-22 – 2021-04-30 (×9): 40 mg via SUBCUTANEOUS
  Filled 2021-04-22 (×9): qty 0.4

## 2021-04-22 MED ORDER — ALBUTEROL SULFATE (2.5 MG/3ML) 0.083% IN NEBU: 2.5000 mg | INHALATION_SOLUTION | RESPIRATORY_TRACT | Status: DC | PRN

## 2021-04-22 MED ORDER — ACETAMINOPHEN 325 MG PO TABS
650.0000 mg | ORAL_TABLET | Freq: Four times a day (QID) | ORAL | Status: DC | PRN
  Administered 2021-04-23 – 2021-04-28 (×2): 650 mg via ORAL
  Filled 2021-04-22 (×3): qty 2

## 2021-04-22 MED ORDER — TBO-FILGRASTIM 480 MCG/0.8ML ~~LOC~~ SOSY
480.0000 ug | PREFILLED_SYRINGE | Freq: Every day | SUBCUTANEOUS | Status: DC
  Administered 2021-04-22 – 2021-04-23 (×2): 480 ug via SUBCUTANEOUS
  Filled 2021-04-22 (×2): qty 0.8

## 2021-04-22 MED ORDER — OXYCODONE HCL 5 MG PO TABS: 10.0000 mg | ORAL_TABLET | Freq: Four times a day (QID) | ORAL | Status: DC | PRN

## 2021-04-22 MED ORDER — FLUCONAZOLE 100MG IVPB
100.0000 mg | INTRAVENOUS | Status: DC
  Administered 2021-04-22 – 2021-04-23 (×2): 100 mg via INTRAVENOUS
  Filled 2021-04-22 (×3): qty 50

## 2021-04-22 MED ORDER — ONDANSETRON HCL 4 MG PO TABS: 4.0000 mg | ORAL_TABLET | Freq: Four times a day (QID) | ORAL | Status: DC | PRN

## 2021-04-22 MED ORDER — SODIUM CHLORIDE 0.9 % IV SOLN: 250.0000 mL | INTRAVENOUS | Status: DC | PRN

## 2021-04-22 MED ORDER — SODIUM CHLORIDE 0.9 % IV SOLN: Freq: Once | INTRAVENOUS | Status: AC

## 2021-04-22 MED ORDER — POTASSIUM CHLORIDE IN NACL 40-0.9 MEQ/L-% IV SOLN
Freq: Once | INTRAVENOUS | Status: AC
  Filled 2021-04-22: qty 1000

## 2021-04-22 MED ORDER — POTASSIUM CHLORIDE 10 MEQ/100ML IV SOLN
10.0000 meq | INTRAVENOUS | Status: AC
  Administered 2021-04-22 (×4): 10 meq via INTRAVENOUS
  Filled 2021-04-22 (×3): qty 100

## 2021-04-22 MED ORDER — ACETAMINOPHEN 325 MG PO TABS: 650.0000 mg | ORAL_TABLET | Freq: Four times a day (QID) | ORAL | Status: DC | PRN

## 2021-04-22 MED ORDER — PROCHLORPERAZINE MALEATE 10 MG PO TABS: 10.0000 mg | ORAL_TABLET | Freq: Four times a day (QID) | ORAL | Status: DC | PRN

## 2021-04-22 NOTE — Progress Notes (Signed)
Upon cleaning after pt had a BM RN and NT discovered a wound in the buttock crevice.  Pt informed RN that the wound has been there for about a week now and that she had let Mendel Ryder, NP know of the wound on 04/21/21. Upon inspection wound is approximately 4 inches in length and 1 inch wide and is stage 2.   Wound was cleaned and dressed w/ a Curel non adherent dressing under an ABD pad and secured with pressure tape.  RN made MD aware.

## 2021-04-22 NOTE — Patient Instructions (Signed)
Tbo-Filgrastim injection What is this medication? TBO-FILGRASTIM (T B O fil GRA stim) is a granulocyte colony-stimulating factor that helps you make more neutrophils, a type of white blood cell. Neutrophils are important for fighting infections. Some chemotherapy affects your bone marrow and lowers your neutrophils. This medicine helps decrease the length of time that neutrophils are very low (severe neutropenia). This medicine may be used for other purposes; ask your health care provider or pharmacist if you have questions. COMMON BRAND NAME(S): Granix What should I tell my care team before I take this medication? They need to know if you have any of these conditions: bone scan or tests planned kidney disease sickle cell anemia an unusual or allergic reaction to tbo-filgrastim, filgrastim, pegfilgrastim, other medicines, foods, dyes, or preservatives pregnant or trying to get pregnant breast-feeding How should I use this medication? This medicine is for injection under the skin. If you get this medicine at home, you will be taught how to prepare and give this medicine. Refer to the Instructions for Use that come with your medication packaging. Use exactly as directed. Take your medicine at regular intervals. Do not take your medicine more often than directed. It is important that you put your used needles and syringes in a special sharps container. Do not put them in a trash can. If you do not have a sharps container, call your pharmacist or healthcare provider to get one. Talk to your pediatrician regarding the use of this medicine in children. While this drug may be prescribed for children as young as 19 month of age for selected conditions, precautions do apply. Overdosage: If you think you have taken too much of this medicine contact a poison control center or emergency room at once. NOTE: This medicine is only for you. Do not share this medicine with others. What if I miss a dose? It is  important not to miss your dose. Call your doctor or health care professional if you miss a dose. What may interact with this medication? This medicine may interact with the following medications: medicines that may cause a release of neutrophils, such as lithium This list may not describe all possible interactions. Give your health care provider a list of all the medicines, herbs, non-prescription drugs, or dietary supplements you use. Also tell them if you smoke, drink alcohol, or use illegal drugs. Some items may interact with your medicine. What should I watch for while using this medication? You may need blood work done while you are taking this medicine. What side effects may I notice from receiving this medication? Side effects that you should report to your doctor or health care professional as soon as possible: allergic reactions like skin rash, itching or hives, swelling of the face, lips, or tongue back pain blood in the urine dark urine dizziness fast heartbeat feeling faint shortness of breath or breathing problems signs and symptoms of infection like fever or chills; cough; or sore throat signs and symptoms of kidney injury like trouble passing urine or change in the amount of urine stomach or side pain, or pain at the shoulder sweating swelling of the legs, ankles, or abdomen tiredness Side effects that usually do not require medical attention (report to your doctor or health care professional if they continue or are bothersome): bone pain diarrhea headache muscle pain vomiting This list may not describe all possible side effects. Call your doctor for medical advice about side effects. You may report side effects to FDA at 1-800-FDA-1088. Where should I  keep my medication? Keep out of the reach of children. Store in a refrigerator between 2 and 8 degrees C (36 and 46 degrees F). Keep in carton to protect from light. Throw away this medicine if it is left out of the  refrigerator for more than 5 consecutive days. Throw away any unused medicine after the expiration date. NOTE: This sheet is a summary. It may not cover all possible information. If you have questions about this medicine, talk to your doctor, pharmacist, or health care provider.  2022 Elsevier/Gold Standard (2018-01-13 00:00:00)  Potassium Chloride Injection What is this medication? POTASSIUM CHLORIDE (poe TASS i um KLOOR ide) prevents and treats low levels of potassium in your body. Potassium plays an important role in maintaining the health of your kidneys, heart, muscles, and nervous system. This medicine may be used for other purposes; ask your health care provider or pharmacist if you have questions. COMMON BRAND NAME(S): PROAMP What should I tell my care team before I take this medication? They need to know if you have any of these conditions: Addison disease Dehydration Diabetes (high blood sugar) Heart disease High levels of potassium in the blood Irregular heartbeat or rhythm Kidney disease Large areas of burned skin An unusual or allergic reaction to potassium, other medications, foods, dyes, or preservatives Pregnant or trying to get pregnant Breast-feeding How should I use this medication? This medication is injected into a vein. It is given in a hospital or clinic setting. Talk to your care team about the use of this medication in children. Special care may be needed. Overdosage: If you think you have taken too much of this medicine contact a poison control center or emergency room at once. NOTE: This medicine is only for you. Do not share this medicine with others. What if I miss a dose? This does not apply. This medication is not for regular use. What may interact with this medication? Do not take this medication with any of the following: Certain diuretics such as spironolactone, triamterene Eplerenone Sodium polystyrene sulfonate This medication may also interact  with the following: Certain medications for blood pressure or heart disease like lisinopril, losartan, quinapril, valsartan Medications that lower your chance of fighting infection such as cyclosporine, tacrolimus NSAIDs, medications for pain and inflammation, like ibuprofen or naproxen Other potassium supplements Salt substitutes This list may not describe all possible interactions. Give your health care provider a list of all the medicines, herbs, non-prescription drugs, or dietary supplements you use. Also tell them if you smoke, drink alcohol, or use illegal drugs. Some items may interact with your medicine. What should I watch for while using this medication? Visit your care team for regular checks on your progress. Tell your care team if your symptoms do not start to get better or if they get worse. You may need blood work while you are taking this medication. Avoid salt substitutes unless you are told otherwise by your care team. What side effects may I notice from receiving this medication? Side effects that you should report to your care team as soon as possible: Allergic reactions--skin rash, itching, hives, swelling of the face, lips, tongue, or throat High potassium level--muscle weakness, fast or irregular heartbeat Side effects that usually do not require medical attention (report to your care team if they continue or are bothersome): Diarrhea Nausea Stomach pain Vomiting This list may not describe all possible side effects. Call your doctor for medical advice about side effects. You may report side effects to FDA  at 1-800-FDA-1088. Where should I keep my medication? This medication is given in a hospital or clinic. It will not be stored at home. NOTE: This sheet is a summary. It may not cover all possible information. If you have questions about this medicine, talk to your doctor, pharmacist, or health care provider.  2022 Elsevier/Gold Standard (2020-06-30 00:00:00)

## 2021-04-22 NOTE — Assessment & Plan Note (Signed)
01/18/2019:Patient palpated a left breast lump for 2 weeks. Mammogram showed a 2.3cm left breast mass at the 12 o'clock position, with 1 enlarged left axillary lymph node measuring 3.5cm. 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 T2N1 stage IIa clinical stage  Treatment Summary: 1. Neoadjuvant chemotherapy with TCH Perjeta 6 cycles (maintenance anti-HER-2 therapy is not being given because the initial pathology was reread as HER-2 negative  2. 06/13/2019 left lumpectomy ([facility]): no residual carcinoma, 2/3 left axillary lymph nodes positive for carcinoma. 07/15/2019: ALN D: 3/3 lymph nodes positive.  No extranodal extension 3. Followed by adjuvant radiation therapy started 5/25/2021CT Chest 01/25/21: No PE, Lymphadenopathy in mediastinum and hilar regions, Mass vs LN in axillary tain METASTATIC progression: 03/23/2021: Widespread metastatic disease including the liver thoracoabdominal nodes and left chest wall/breast.  Gallbladder wall thickening, small right pleural effusion. 03/25/2021: Diagnostic mammogram showing several left breast masses concerning for malignancy the largest being 4.6 cm.  -------------------------------------------------------------------------------------------------------------------------------------------------------------------------------------------------------------------------------------- Current treatment: Abraxane pembrolizumab first dose given 04/15/2021 Toxicities: 1.  Severe fatigue 2. neutropenic fever: Temperature is 100.4 F with white count of 0.7, ANC is pending.  I recommended daily Granix injections 480 mcg until [place] is greater than 1000, prophylactic antibiotics 3.  Mouth sores: I recommend Magic mouthwash 4.  Sore throat: Yesterday she was started on Diflucan for thrush.  Severely elevated LFTs: After first cycle of  chemo her LFTs are starting to come down. She no longer has widespread abdominal pain  Prognosis: Patient fully understands the severity of her disease.  If she can handle a few treatments we might be able to prolong her life.  The LFTs coming down are a very good sign of response.  Plan: Hospitalization for neutropenic fever and failure to thrive We will request hospitalist to admit the patient.  I will follow her in-house.

## 2021-04-22 NOTE — Progress Notes (Signed)
CRITICAL VALUE STICKER  CRITICAL VALUE: WBC 0.7 ANC 0.1  Geraldo Docker, RN  DATE & TIME NOTIFIED: 04/22/21 at 23  MD NOTIFIED: Nicholas Lose, MD  [place]: 04/22/21 at 1228  RESPONSE:  MD notified and verbalized understanding.

## 2021-04-22 NOTE — H&P (Signed)
[facility] History and Physical  Paige Perry EZM:[id number] DOB: 1975/05/08 DOA: 04/22/2021  Referring physician: ED  PCP: [name], NP   Patient is coming from: Home  Chief Complaint:  fever  HPI: Paige Perry is a 46 y.o. female with past medical history of breast cancer with metastasis was sent from oncology office with complaints of sore throat, fatigue weakness and fever.  Patient was treated with Magic mouthwash and Diflucan as outpatient but did not improve.  Patient complains of severe burning and pain whenever she tries to eat and drink something so she has not been eating much.  Patient however denies any nausea, vomiting or abdominal pain, diarrhea or constipation.  Patient did have some fever but denies rigor.  Denies any headache, visual blurring.  Denies any pain over the Port-A-Cath area.  Denies any new skin rash but has some soreness on her back.  Patient denies any recent travel or sick contacts.  Denies any syncope but has extreme fatigue and weakness.  Complains of vaginal dryness and occasional burning sensation when ever she urinates due to dryness.  ED Course: In the ED, patient was noted to have a fever with mild tachycardia.  Labs done in the outpatient clinic today showed a WBC count of 0.7 with hemoglobin of 7.6 and platelet of 196.  Magnesium of 1.9.  Normal level was low at 126, potassium of 2.9 and chloride of 90.  Creatinine at 0.4.  AST and ALT were elevated at 150 and 146 respectively.  Alkaline phos was elevated at 913 and total bilirubin 13.5.  Patient was then considered for admission to hospital for neutropenic fever.  Review of Systems:  All systems were reviewed and were negative unless otherwise mentioned in the HPI  Past Medical History:  Diagnosis Date   Asthma    Cancer ([place])    breast   Family history of breast cancer    Family history of breast cancer    Family history of prostate cancer    Family history of throat cancer     Hypercholesteremia    Hypokalemia 04/21/2021   PONV (postoperative nausea and vomiting)    Past Surgical History:  Procedure Laterality Date   AXILLARY LYMPH NODE DISSECTION Left 07/11/2019   Procedure: LEFT AXILLARY LYMPH NODE DISSECTION;  Surgeon: Donnie Mesa, MD;  Location: [facility];  Service: General;  Laterality: Left;   BREAST LUMPECTOMY WITH RADIOACTIVE SEED AND AXILLARY LYMPH NODE DISSECTION Left 06/13/2019   Procedure: LEFT BREAST LUMPECTOMY WITH RADIOACTIVE SEED AND AXILLARY TARGETED LYMPH NODE DISSECTION;  Surgeon: Donnie Mesa, MD;  Location: [facility];  Service: General;  Laterality: Left;   FINGER SURGERY     PORTACATH PLACEMENT Right 02/05/2019   Procedure: INSERTION PORT-A-CATH WITH ULTRASOUND;  Surgeon: Donnie Mesa, MD;  Location: [facility];  Service: General;  Laterality: Right;   PORTACATH PLACEMENT Right 04/14/2021   Procedure: INSERTION PORT-A-CATH;  Surgeon: Donnie Mesa, MD;  Location: [facility];  Service: General;  Laterality: Right;   WISDOM TOOTH EXTRACTION     WOUND EXPLORATION Right 06/13/2019   Procedure: RIGHT CHEST WOUND EXPLORATION;  Surgeon: Donnie Mesa, MD;  Location: [facility];  Service: General;  Laterality: Right;    Social History:  reports that she has never smoked. She has never used smokeless tobacco. She reports current alcohol use. She reports that she does not use drugs.  Allergies  Allergen Reactions   Latex  Other     Corn   Adhesive [Tape] Rash   Tapentadol Rash    Family History  Problem Relation Age of Onset   Hypertension Mother    Heart failure Mother    Breast cancer Maternal Aunt        dx 36s   Breast cancer Paternal Aunt    Prostate cancer Father 45   Throat cancer Cousin      Prior to Admission medications   Medication Sig Start Date End Date Taking? Authorizing Provider  dexamethasone (DECADRON) 4 MG tablet Take 1 tablet  (4 mg total) by mouth 2 (two) times daily with a meal. 03/26/21   Nicholas Lose, MD  fentaNYL (DURAGESIC) 25 MCG/HR Place 1 patch onto the skin every 3 (three) days. 03/30/21   Nicholas Lose, MD  fluconazole (DIFLUCAN) 100 MG tablet Take 1 tablet (100 mg total) by mouth daily. 04/21/21   Gardenia Phlegm, NP  lidocaine-prilocaine (EMLA) cream Apply to affected area once 04/07/21   Nicholas Lose, MD  nystatin (MYCOSTATIN) 100000 UNIT/ML suspension Take 5 mLs (500,000 Units total) by mouth 4 (four) times daily. 04/15/21   Causey, Charlestine Massed, NP  ondansetron (ZOFRAN) 8 MG tablet Take 1 tablet (8 mg total) by mouth 2 (two) times daily as needed (Nausea or vomiting). 04/07/21   Nicholas Lose, MD  Oxycodone HCl 10 MG TABS Take 1 tablet (10 mg total) by mouth 4 (four) times daily as needed. 04/15/21   Pickenpack-Cousar, Carlena Sax, NP  prochlorperazine (COMPAZINE) 10 MG tablet Take 1 tablet (10 mg total) by mouth every 6 (six) hours as needed (Nausea or vomiting). 04/07/21   Nicholas Lose, MD    Physical Exam: Vitals:   04/22/21 1702  BP: 106/72  Pulse: (!) 146  Resp: 19  Temp: (!) 100.4 F (38 C)  TempSrc: Oral  SpO2: 95%   Wt Readings from Last 3 Encounters:  04/15/21 74.3 kg  04/14/21 75.3 kg  04/05/21 75.7 kg   There is no height or weight on file to calculate BMI.  General:  Average built, not in obvious distress HENT: Normocephalic, pupils equally reacting to light and accommodation.  No scleral pallor or icterus noted. Oral mucosa with some angular ulceration, slight whitish discoloration. Chest:  Clear breath sounds.  Diminished breath sounds bilaterally. No crackles or wheezes.  Chest wall Port-A-Cath slightly appears healthy. CVS: S1 &S2 heard. No murmur.  Regular rate and rhythm. Abdomen: Soft, nontender, nondistended.  Bowel sounds are heard.   Extremities: No cyanosis, clubbing or edema.  Peripheral pulses are palpable. Psych: Alert, awake and oriented, normal mood CNS:   No cranial nerve deficits.  Moves all extremities.   Skin: Warm and dry.  Stage II sacral ulceration present on admission.  Labs on Admission:   CBC: Recent Labs  Lab 04/19/21 0857 04/22/21 1144  WBC 3.6* 0.7*  NEUTROABS 3.2 0.1*  HGB 9.7* 7.6*  HCT 28.7* 22.8*  MCV 85.4 86.0  PLT 197 161    Basic Metabolic Panel: Recent Labs  Lab 04/19/21 0857 04/21/21 0858 04/22/21 1144  NA 121* 125* 126*  K 3.6 2.6* 2.9*  CL 84* 90* 90*  CO2 24 27 27   GLUCOSE 104* 88 97  BUN 11 6 7   CREATININE <0.30* 0.38* 0.43*  CALCIUM 7.9* 6.8* 7.7*  MG  --   --  1.9    Liver Function Tests: Recent Labs  Lab 04/19/21 0857 04/21/21 0858 04/22/21 1144  AST 412* 255* 150*  ALT 245*  184* 146*  ALKPHOS 950* 1,194* 913*  BILITOT 21.0* 18.6* 13.5*  PROT 6.6 4.4* 5.8*  ALBUMIN 1.8* 2.0* 2.3*   No results for input(s): LIPASE, AMYLASE in the last 168 hours. No results for input(s): AMMONIA in the last 168 hours.  Cardiac Enzymes: No results for input(s): CKTOTAL, CKMB, CKMBINDEX, TROPONINI in the last 168 hours.  BNP (last 3 results) No results for input(s): BNP in the last 8760 hours.  ProBNP (last 3 results) No results for input(s): PROBNP in the last 8760 hours.  CBG: No results for input(s): GLUCAP in the last 168 hours.  Lipase     Component Value Date/Time   LIPASE 10 (L) 03/22/2021 0955     Urinalysis    Component Value Date/Time   COLORURINE YELLOW 03/22/2021 1125   APPEARANCEUR HAZY (A) 03/22/2021 1125   LABSPEC 1.019 03/22/2021 1125   PHURINE 6.0 03/22/2021 1125   GLUCOSEU NEGATIVE 03/22/2021 1125   HGBUR NEGATIVE 03/22/2021 1125   BILIRUBINUR NEGATIVE 03/22/2021 1125   KETONESUR >80 (A) 03/22/2021 1125   PROTEINUR TRACE (A) 03/22/2021 1125   UROBILINOGEN 1.0 01/09/2012 1629   NITRITE NEGATIVE 03/22/2021 1125   LEUKOCYTESUR NEGATIVE 03/22/2021 1125     Drugs of Abuse  No results found for: LABOPIA, COCAINSCRNUR, [place], AMPHETMU, THCU, LABBARB     Radiological Exams on Admission: No results found.  EKG: Not available for review.  Assessment/Plan Principal Problem:   Neutropenic fever ([place]) Active Problems:   Malignant neoplasm of upper-outer quadrant of left breast in female, estrogen receptor positive ([place])   Liver metastases ([facility])   Severe neutropenia with neutropenic fever. We will get a urine culture blood cultures.  Obtain cultures from peripheral line and port, start on broad-spectrum antibiotic.  Start Granix 40 mcg daily until [place] more than 1000 as per oncology recommendation.  We will continue with IV Diflucan, cefepime.  Monitor closely.  Temperature max noted at 100.4 F.  Patient is mildly tachycardic.  Blood pressure is 106/72.  We will get x-ray of the chest.  Oral ulcerations, sore throat.  Patient has been started on Magic mouthwash and Diflucan will continue while in the hospital.  We will also get MRSA screen.  Dietary screen as well.  Anemia of chronic disease malignancy.  Continue to monitor.  Transfuse as necessary.  History of breast cancer with liver metastasis.  Being followed by oncology as outpatient.  We will add oncology team in the care group  Severe hypokalemia.  Potassium of 2.9 from BMP in the cancer center.  We will replenish with IV potassium chloride.  Magnesium was 1.9.  Check BMP in AM.  Hyponatremia.  Sodium of 126.  We will continue with normal saline overnight.  Possibility of volume depletion related.  Check levels in a.m.  Hypoalbuminemia, elevated LFTs with elevated bilirubin..  Patient does have liver metastasis.  We will monitor CMP daily.  Severe fatigue weakness.  Could be multifactorial from electrolyte imbalance, volume depletion, neutropenic fever.  Continue IV hydration, antibiotic.  We will get PT evaluation.  Stage II pressure ulceration.  Present admission.  Will need wound care while in the hospital.   DVT Prophylaxis: Lovenox subcu.  Consultant: Oncology will be  added on the treatment team  Code Status: Full code  Microbiology blood cultures and urine cultures  Antibiotics: Cefepime, fluconazole  Family Communication:  Patients' condition and plan of care including tests being ordered have been discussed with the patient  who indicate understanding and agree with the  plan.  Status is: Inpatient  Remains inpatient appropriate because: IV antibiotic, neutropenic fever,   Severity of Illness: The appropriate patient status for this patient is INPATIENT. Inpatient status is judged to be reasonable and necessary in order to provide the required intensity of service to ensure the patient's safety. The patient's presenting symptoms, physical exam findings, and initial radiographic and laboratory data in the context of their chronic comorbidities is felt to place them at high risk for further clinical deterioration. Furthermore, it is not anticipated that the patient will be medically stable for discharge from the hospital within 2 midnights of admission. I certify that at the point of admission it is my clinical judgment that the patient will require inpatient hospital care spanning beyond 2 midnights from the point of admission due to high intensity of service, high risk for further deterioration and high frequency of surveillance required.  Signed, Flora Lipps, MD [facility] 04/22/2021

## 2021-04-22 NOTE — Progress Notes (Signed)
Patient Care Team: [name], NP as PCP - General Mauro Kaufmann, RN as Oncology Nurse Navigator Carlynn Spry, Charlott Holler, RN as Oncology Nurse Navigator Donnie Mesa, MD as Consulting Physician (General Surgery) Nicholas Lose, MD as Consulting Physician (Hematology and Oncology) Kyung Rudd, MD as Consulting Physician (Radiation Oncology) Pickenpack-Cousar, Carlena Sax, NP as Nurse Practitioner (Nurse Practitioner)  DIAGNOSIS:  Encounter Diagnosis  Name Primary?   Malignant neoplasm of upper-outer quadrant of left breast in female, estrogen receptor positive ([place])     SUMMARY OF ONCOLOGIC HISTORY: Oncology History  Malignant neoplasm of upper-outer quadrant of left breast in female, estrogen receptor positive ([place])  01/18/2019 Initial Diagnosis   Patient palpated a left breast lump for 2 weeks. Mammogram showed a 2.3cm left breast mass at the 12 o'clock position, with 1 enlarged left axillary lymph node measuring 3.5cm. 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   01/23/2019 Cancer Staging   Staging form: Breast, AJCC 8th Edition - Clinical stage from 01/23/2019: Stage IIA (cT2, cN1(f), cM0, G2, ER+, PR-, HER2+)   02/01/2019 Genetic Testing   Negative genetic testing. No pathogenic variants identified on the [facility] Common Hereditary Cancers Panel. The Common Hereditary Cancers Panel offered by [facility] includes sequencing and/or deletion duplication testing of the following 48 genes: APC, ATM, AXIN2, BARD1, BMPR1A, BRCA1, BRCA2, BRIP1, CDH1, CDKN2A (p14ARF), CDKN2A (p16INK4a), CKD4, CHEK2, CTNNA1, DICER1, EPCAM (Deletion/duplication testing only), GREM1 (promoter region deletion/duplication testing only), KIT, MEN1, MLH1, MSH2, MSH3, MSH6, MUTYH, NBN, NF1, NHTL1, PALB2, PDGFRA, PMS2, POLD1, POLE, PTEN, RAD50, RAD51C, RAD51D, RNF43, SDHB, SDHC, SDHD, SMAD4, SMARCA4. STK11, TP53, TSC1, TSC2, and VHL.  The following genes were evaluated for sequence changes  only: SDHA and HOXB13 c.251G>A variant only. The report date is 02/01/2019.    02/13/2019 - 05/28/2019 Chemotherapy   dexamethasone (DECADRON) 4 MG tablet, 4 mg (100 % of original dose 4 mg), Oral, Daily, 1 of 1 cycle, Start date: 01/23/2019, End date: 05/28/2019. Dose modification: 4 mg (original dose 4 mg, Cycle 0)  palonosetron (ALOXI) injection 0.25 mg, 0.25 mg, Intravenous,  Once, 6 of 6 cycles. Administration: 0.25 mg (02/13/2019), 0.25 mg (03/04/2019), 0.25 mg (05/07/2019), 0.25 mg (05/28/2019), 0.25 mg (03/26/2019), 0.25 mg (04/18/2019)  pegfilgrastim-jmdb (FULPHILA) injection 6 mg, 6 mg, Subcutaneous,  Once, 6 of 6 cycles. Administration: 6 mg (02/15/2019), 6 mg (03/06/2019), 6 mg (05/09/2019), 6 mg (05/30/2019), 6 mg (03/28/2019), 6 mg (04/20/2019)  CARBOplatin (PARAPLATIN) 700 mg in sodium chloride 0.9 % 250 mL chemo infusion, 700 mg (100 % of original dose 700 mg), Intravenous,  Once, 6 of 6 cycles. Dose modification: 700 mg (original dose 700 mg, Cycle 1). Administration: 700 mg (02/13/2019), 700 mg (03/04/2019), 700 mg (05/07/2019), 700 mg (05/28/2019), 700 mg (03/26/2019), 700 mg (04/18/2019)  DOCEtaxel (TAXOTERE) 140 mg in sodium chloride 0.9 % 250 mL chemo infusion, 75 mg/m2 = 140 mg, Intravenous,  Once, 6 of 6 cycles. Dose modification: 65 mg/m2 (original dose 75 mg/m2, Cycle 5, Reason: Dose not tolerated). Administration: 140 mg (02/13/2019), 140 mg (03/04/2019), 120 mg (05/07/2019), 120 mg (05/28/2019), 140 mg (03/26/2019), 140 mg (04/18/2019)  pertuzumab (PERJETA) 420 mg in sodium chloride 0.9 % 250 mL chemo infusion, 420 mg (100 % of original dose 420 mg), Intravenous, Once, 3 of 3 cycles. Dose modification: 420 mg (original dose 420 mg, Cycle 1, Reason: Provider Judgment). Administration: 420 mg (02/13/2019), 420 mg (03/04/2019), 420 mg (03/26/2019)  fosaprepitant (EMEND) 150 mg  dexamethasone (DECADRON) 12 mg in sodium chloride  0.9 % 145 mL IVPB, , Intravenous,  Once, 6 of 6 cycles. Administration:   (02/13/2019),  (03/04/2019),  (05/07/2019),  (05/28/2019),  (03/26/2019),  (04/18/2019)  trastuzumab-dkst (OGIVRI) 651 mg in sodium chloride 0.9 % 250 mL chemo infusion, 8 mg/kg = 651 mg, Intravenous,  Once, 6 of 6 cycles. Administration: 651 mg (02/13/2019), 483 mg (03/04/2019), 483 mg (05/07/2019), 483 mg (05/28/2019), 483 mg (03/26/2019), 483 mg (04/18/2019).   06/13/2019 Surgery   Left lumpectomy ([facility]) [phone]): no residual carcinoma, 2/3 left axillary lymph nodes positive for carcinoma. Lymphovascular space invasion was present.   06/20/2019 Cancer Staging   Staging form: Breast, AJCC 8th Edition - Pathologic stage from 06/20/2019: No Stage Recommended (ypT0, pN1, cM0, ER+, PR-, HER2-)   07/11/2019 Surgery   Axillary lymph node dissection ([facility]) ([id number]): 3/3 lymph nodes positive for cancer   08/19/2019 - 10/07/2019 Radiation Therapy   The patient initially received a dose of 50.4 Gy in 28 fractions to the breast and SCLV region using a 4-field approach. This was delivered using a 3-D conformal technique. The patient then received a boost to the seroma. This delivered an additional 10 Gy in 5 fractions using a 3-field photon boost technique. The total dose was 60.4 Gy.   09/2019 - 09/2029 Anti-estrogen oral therapy   Tamoxifen   04/15/2021 -  Chemotherapy   Patient is on Treatment Plan : BREAST Paclitaxel-Albumin (Abraxane) (100) D1,8,15 + Pembrolizumab (200) every 3 weeks, q28d     Liver metastases ([place])  04/19/2021 Initial Diagnosis   Liver metastases ([place])   04/19/2021 Cancer Staging   Staging form: Liver, AJCC 8th Edition - Clinical: Stage IVB (cM1) - Signed by Gardenia Phlegm, NP on 04/19/2021      CHIEF COMPLIANT: Neutropenic fever, severe failure to thrive  INTERVAL HISTORY: Paige Perry is a 46 year old with above-mentioned history of metastatic breast cancer who received first cycle of Abraxane and pembrolizumab treatment on 04/15/2021.  We have been seeing her  regularly because of markedly elevated LFTs.  Since chemotherapy she has felt sore throat and mouth sores.  She was started on Diflucan yesterday.  Today she comes in has a fever of 100.4 F along with tachycardia and severe failure to thrive with diffuse generalized weakness.  She has failed outpatient management and therefore we would like to admit her to the hospital today.   ALLERGIES:  is allergic to latex, other, adhesive [tape], and tapentadol.  MEDICATIONS:  Current Outpatient Medications  Medication Sig Dispense Refill   dexamethasone (DECADRON) 4 MG tablet Take 1 tablet (4 mg total) by mouth 2 (two) times daily with a meal. 60 tablet 1   fentaNYL (DURAGESIC) 25 MCG/HR Place 1 patch onto the skin every 3 (three) days. 10 patch 0   fluconazole (DIFLUCAN) 100 MG tablet Take 1 tablet (100 mg total) by mouth daily. 7 tablet 0   lidocaine-prilocaine (EMLA) cream Apply to affected area once 30 g 3   nystatin (MYCOSTATIN) 100000 UNIT/ML suspension Take 5 mLs (500,000 Units total) by mouth 4 (four) times daily. 60 mL 0   ondansetron (ZOFRAN) 8 MG tablet Take 1 tablet (8 mg total) by mouth 2 (two) times daily as needed (Nausea or vomiting). 30 tablet 1   Oxycodone HCl 10 MG TABS Take 1 tablet (10 mg total) by mouth 4 (four) times daily as needed. 90 tablet 0   prochlorperazine (COMPAZINE) 10 MG tablet Take 1 tablet (10 mg total) by mouth every 6 (six) hours as needed (Nausea or  vomiting). 30 tablet 1   No current facility-administered medications for this visit.   Facility-Administered Medications Ordered in Other Visits  Medication Dose Route Frequency Provider Last Rate Last Admin   filgrastim-aafi (NIVESTYM) injection 480 mcg  480 mcg Subcutaneous Once Nicholas Lose, MD        PHYSICAL EXAMINATION: ECOG PERFORMANCE STATUS: 3 - Symptomatic, >50% confined to bed  There were no vitals filed for this visit. There were no vitals filed for this visit.    LABORATORY DATA:  I have reviewed  the data as listed CMP Latest Ref Rng & Units 04/22/2021 04/21/2021 04/19/2021  Glucose 70 - 99 mg/dL 97 88 104(H)  BUN 6 - 20 mg/dL _0 Creatinine 0.44 - 1.00 mg/dL 0.43(L) 0.38(L) <0.30(L)  Sodium 135 - 145 mmol/L 126(L) 125(L) 121(L)  Potassium 3.5 - 5.1 mmol/L 2.9(L) 2.6(LL) 3.6  Chloride 98 - 111 mmol/L 90(L) 90(L) 84(L)  CO2 22 - 32 mmol/L _1 Calcium 8.9 - 10.3 mg/dL 7.7(L) 6.8(L) 7.9(L)  Total Protein 6.5 - 8.1 g/dL 5.8(L) 4.4(L) 6.6  Total Bilirubin 0.3 - 1.2 mg/dL 13.5(HH) 18.6(HH) 21.0(HH)  Alkaline Phos 38 - 126 U/L 913(H) 1,194(H) 950(H)  AST 15 - 41 U/L 150(H) 255(HH) 412(HH)  ALT 0 - 44 U/L 146(H) 184(H) 245(H)    Lab Results  Component Value Date   WBC 0.7 (LL) 04/22/2021   HGB 7.6 (L) 04/22/2021   HCT 22.8 (L) 04/22/2021   MCV 86.0 04/22/2021   PLT 196 04/22/2021   NEUTROABS PENDING 04/22/2021    ASSESSMENT & PLAN:  Malignant neoplasm of upper-outer quadrant of left breast in female, estrogen receptor positive ([place]) 01/18/2019: Patient palpated a left breast lump for 2 weeks. Mammogram showed a 2.3cm left breast mass at the 12 o'clock position, with 1 enlarged left axillary lymph node measuring 3.5cm. 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  ER positive, PR negative, HER-2 positive (initial IHC 3+) accidentally FISH was done which was negative. T2N1 stage IIa clinical stage   Treatment Summary: 1. Neoadjuvant chemotherapy with TCH Perjeta 6 cycles (maintenance anti-HER-2 therapy is not being given because the initial pathology was reread as HER-2 negative  2. 06/13/2019 left lumpectomy ([facility]): no residual carcinoma, 2/3 left axillary lymph nodes positive for carcinoma. 07/15/2019: ALN D: 3/3 lymph nodes positive.  No extranodal extension 3. Followed by adjuvant radiation therapy started 5/25/2021CT Chest 01/25/21: No PE, Lymphadenopathy in mediastinum and hilar regions, Mass vs LN in axillary tain METASTATIC  progression: 03/23/2021: Widespread metastatic disease including the liver thoracoabdominal nodes and left chest wall/breast.  Gallbladder wall thickening, small right pleural effusion. 03/25/2021: Diagnostic mammogram showing several left breast masses concerning for malignancy the largest being 4.6 cm.  -------------------------------------------------------------------------------------------------------------------------------------------------------------------------------------------------------------------------------------- Current treatment: Abraxane pembrolizumab first dose given 04/15/2021 Toxicities: 1.  Severe fatigue 2. neutropenic fever: Temperature is 100.4 F with white count of 0.7, ANC is pending.  I recommended daily Granix injections 480 mcg until [place] is greater than 1000, prophylactic antibiotics 3.  Mouth sores: I recommend Magic mouthwash 4.  Sore throat: Yesterday she was started on Diflucan for thrush.  Severely elevated LFTs: After first cycle of chemo her LFTs are starting to come down. She no longer has widespread abdominal pain  Prognosis: Patient fully understands the severity of her disease.  If she can handle a few treatments we might be able to prolong her life.  The LFTs coming down are a very good sign of response.  Plan: Hospitalization for neutropenic fever and failure to thrive We will request Dr. Olevia Bowens with hospitalist group to admit the patient.  I will follow her in-house.   No orders of the defined types were placed in this encounter.  The patient has a good understanding of the overall plan. she agrees with it. she will call with any problems that may develop before the next visit here. Total time spent: 45 mins including face to face time and time spent for planning, charting and co-ordination of care   Harriette Ohara, MD 04/22/21

## 2021-04-22 NOTE — Progress Notes (Signed)
HR sustaining in 140s. Dr. Louanne Belton notified, stated to continue IVF and monitor for fever. Will continue to monitor.

## 2021-04-22 NOTE — Progress Notes (Signed)
Received call from bed placement with assigned pt room for direct admission- room 1601.  RN placed call to [place], Lattie Haw and gave full SBAR report.  Infusion RN notified of bed location and will safely transport patient to her room.

## 2021-04-22 NOTE — Progress Notes (Signed)
Pharmacy Antibiotic Note  EVANIA LYNE is a 46 y.o. female admitted on 04/22/2021 with neutropenic fever.  Pharmacy has been consulted for Cefepime dosing. Tm 100.4, ANC 0.1 Granix 480 mcg daily (1/26 >> )  Plan: Cefepime 2g IV q8h Follow up renal function, culture results, and clinical course.     Temp (24hrs), Avg:100.3 F (37.9 C), Min:100 F (37.8 C), Max:100.4 F (38 C)  Recent Labs  Lab 04/19/21 0857 04/21/21 0858 04/22/21 1144  WBC 3.6*  --  0.7*  CREATININE <0.30* 0.38* 0.43*    Estimated Creatinine Clearance: 84.9 mL/min (A) (by C-G formula based on SCr of 0.43 mg/dL (L)).    Allergies  Allergen Reactions   Latex    Other     Corn   Adhesive [Tape] Rash   Tapentadol Rash     Thank you for allowing pharmacy to be a part of this patients care.  Gretta Arab PharmD, BCPS Clinical Pharmacist [facility] main pharmacy [phone] 04/22/2021 5:16 PM

## 2021-04-22 NOTE — Progress Notes (Signed)
°   04/22/21 1850  Assess: MEWS Score  Temp 98.9 F (37.2 C)  BP 99/71  Pulse Rate (!) 146  ECG Heart Rate (!) 149  Resp 20  SpO2 92 %  O2 Device Room Air  Assess: MEWS Score  MEWS Temp 0  MEWS Systolic 1  MEWS Pulse 3  MEWS RR 0  MEWS LOC 0  MEWS Score 4  MEWS Score Color Red  Assess: if the MEWS score is Yellow or Red  Were vital signs taken at a resting state? No  Focused Assessment No change from prior assessment  Does the patient meet 2 or more of the SIRS criteria? No  MEWS guidelines implemented *See Row Information* Yes  Treat  MEWS Interventions Escalated (See documentation below)  Pain Scale 0-10  Pain Score 0  Take Vital Signs  Increase Vital Sign Frequency  Red: Q 1hr X 4 then Q 4hr X 4, if remains red, continue Q 4hrs  Escalate  MEWS: Escalate Red: discuss with charge nurse/RN and provider, consider discussing with RRT  Notify: Charge Nurse/RN  Name of Charge Nurse/RN Notified [place], RN  Date Charge Nurse/RN Notified 04/22/21  Time Charge Nurse/RN Notified 1940  Notify: Provider  Provider Name/Title Kennon Holter, NP  Date Provider Notified 04/22/21  Time Provider Notified 1947  Notification Type Page  Notification Reason Other (Comment) (Red MEWS)  Document  Patient Outcome Not stable and remains on department  Progress note created (see row info) Yes  Assess: SIRS CRITERIA  SIRS Temperature  0  SIRS Pulse 1  SIRS Respirations  0  SIRS WBC 0  SIRS Score Sum  1

## 2021-04-22 NOTE — Progress Notes (Signed)
Paige Perry  Code Status: FULL HARVEY LINGO is a 46 y.o. female patient admitted from ED awake, alert - oriented X4 - no acute distress noted. VSS -  no c/o shortness of breath, no c/o chest pain. Cardiac tele in place. Fall assessment complete, with patient able to verbalize understanding of risk associated with falls, and verbalized understanding to call nursing before up out of bed. Call light within reach, patient able to voice, and demonstrate understanding. Stage 2 pressure injury on mid sacrum.  ?  Will cont to eval and treat per MD orders.  Melonie [place], RN

## 2021-04-22 NOTE — Plan of Care (Signed)
Plan of Care Note for accepted transfer  Patient: Paige Perry    HNG:[id number]  DOA: (Not on file)     Facility requesting transfer: [facility].  Requesting Provider: Nicholas Lose, MD Reason for transfer: Neutropenic fever. Facility course: The patient has a history of widespread metastatic estrogen receptor positive breast cancer. She recently have abnormal LFTs following her first chemo cycle. LFTs are improving. However, the patient has now neutropenic fever, hypotension, tachycardia and volume depletion improving with IVF. Accepted to telemetry bed. Please see Dr. Geralyn Flash note for further information.   Plan of care: The patient is accepted for admission to Telemetry unit, at [facility].   Author: Reubin Milan, MD  04/22/2021  Check [email or web address] for on-call coverage.  Nursing staff, Please call [place] number on Amion as soon as patient's arrival, so appropriate admitting provider can evaluate the pt.

## 2021-04-22 NOTE — Progress Notes (Signed)
CRITICAL VALUE STICKER  CRITICAL VALUE: total bili 13.5 RECEIVER (on-site recipient of call): Sherron Ales, LPN  DATE & TIME NOTIFIED: 04/22/21 1235  MESSENGER (representative from lab): Jeannene Patella, MT  MD NOTIFIED: yes  TIME OF NOTIFICATION: 6659  RESPONSE: total bili has improved since last checked.

## 2021-04-23 ENCOUNTER — Inpatient Hospital Stay: Payer: BC Managed Care – PPO | Admitting: Hematology and Oncology

## 2021-04-23 ENCOUNTER — Inpatient Hospital Stay: Payer: BC Managed Care – PPO

## 2021-04-23 DIAGNOSIS — C787 Secondary malignant neoplasm of liver and intrahepatic bile duct: Secondary | ICD-10-CM | POA: Diagnosis not present

## 2021-04-23 DIAGNOSIS — E869 Volume depletion, unspecified: Secondary | ICD-10-CM

## 2021-04-23 DIAGNOSIS — E876 Hypokalemia: Secondary | ICD-10-CM

## 2021-04-23 DIAGNOSIS — C50412 Malignant neoplasm of upper-outer quadrant of left female breast: Secondary | ICD-10-CM | POA: Diagnosis not present

## 2021-04-23 DIAGNOSIS — L89152 Pressure ulcer of sacral region, stage 2: Secondary | ICD-10-CM

## 2021-04-23 DIAGNOSIS — L8990 Pressure ulcer of unspecified site, unspecified stage: Secondary | ICD-10-CM | POA: Insufficient documentation

## 2021-04-23 DIAGNOSIS — D709 Neutropenia, unspecified: Secondary | ICD-10-CM | POA: Diagnosis not present

## 2021-04-23 DIAGNOSIS — E871 Hypo-osmolality and hyponatremia: Secondary | ICD-10-CM

## 2021-04-23 LAB — COMPREHENSIVE METABOLIC PANEL
ALT: 143 U/L — ABNORMAL HIGH (ref 0–44)
AST: 187 U/L — ABNORMAL HIGH (ref 15–41)
Albumin: <1.5 g/dL — ABNORMAL LOW (ref 3.5–5.0)
Alkaline Phosphatase: 609 U/L — ABNORMAL HIGH (ref 38–126)
Anion gap: 9 (ref 5–15)
BUN: 8 mg/dL (ref 6–20)
CO2: 22 mmol/L (ref 22–32)
Calcium: 7.1 mg/dL — ABNORMAL LOW (ref 8.9–10.3)
Chloride: 98 mmol/L (ref 98–111)
Creatinine, Ser: <0.3 mg/dL — ABNORMAL LOW (ref 0.44–1.00)
Glucose, Bld: 109 mg/dL — ABNORMAL HIGH (ref 70–99)
Potassium: 3.4 mmol/L — ABNORMAL LOW (ref 3.5–5.1)
Sodium: 129 mmol/L — ABNORMAL LOW (ref 135–145)
Total Bilirubin: 12 mg/dL — ABNORMAL HIGH (ref 0.3–1.2)
Total Protein: 5.5 g/dL — ABNORMAL LOW (ref 6.5–8.1)

## 2021-04-23 LAB — CBC WITH DIFFERENTIAL/PLATELET
Abs Immature Granulocytes: 0.02 10*3/uL (ref 0.00–0.07)
Basophils Absolute: 0 10*3/uL (ref 0.0–0.1)
Basophils Relative: 1 %
Eosinophils Absolute: 0 10*3/uL (ref 0.0–0.5)
Eosinophils Relative: 0 %
HCT: 20.1 % — ABNORMAL LOW (ref 36.0–46.0)
Hemoglobin: 6.5 g/dL — CL (ref 12.0–15.0)
Immature Granulocytes: 2 %
Lymphocytes Relative: 44 %
Lymphs Abs: 0.4 10*3/uL — ABNORMAL LOW (ref 0.7–4.0)
MCH: 28.9 pg (ref 26.0–34.0)
MCHC: 32.3 g/dL (ref 30.0–36.0)
MCV: 89.3 fL (ref 80.0–100.0)
Monocytes Absolute: 0.2 10*3/uL (ref 0.1–1.0)
Monocytes Relative: 28 %
Neutro Abs: 0.2 10*3/uL — CL (ref 1.7–7.7)
Neutrophils Relative %: 25 %
Platelets: 193 10*3/uL (ref 150–400)
RBC: 2.25 MIL/uL — ABNORMAL LOW (ref 3.87–5.11)
RDW: 16.3 % — ABNORMAL HIGH (ref 11.5–15.5)
WBC: 0.8 10*3/uL — CL (ref 4.0–10.5)
nRBC: 61 % — ABNORMAL HIGH (ref 0.0–0.2)

## 2021-04-23 LAB — HEMOGLOBIN AND HEMATOCRIT, BLOOD
HCT: 31.7 % — ABNORMAL LOW (ref 36.0–46.0)
Hemoglobin: 10.8 g/dL — ABNORMAL LOW (ref 12.0–15.0)

## 2021-04-23 LAB — PROTIME-INR
INR: 3.3 — ABNORMAL HIGH (ref 0.8–1.2)
Prothrombin Time: 33.4 seconds — ABNORMAL HIGH (ref 11.4–15.2)

## 2021-04-23 LAB — ABO/RH: ABO/RH(D): B POS

## 2021-04-23 LAB — PREPARE RBC (CROSSMATCH)

## 2021-04-23 MED ORDER — POTASSIUM CHLORIDE 20 MEQ PO PACK
40.0000 meq | PACK | Freq: Once | ORAL | Status: AC
  Administered 2021-04-23: 40 meq via ORAL
  Filled 2021-04-23: qty 2

## 2021-04-23 MED ORDER — ADULT MULTIVITAMIN W/MINERALS CH
1.0000 | ORAL_TABLET | Freq: Every day | ORAL | Status: DC
  Administered 2021-04-23 – 2021-04-30 (×8): 1 via ORAL
  Filled 2021-04-23 (×8): qty 1

## 2021-04-23 MED ORDER — PANTOPRAZOLE SODIUM 40 MG IV SOLR
40.0000 mg | Freq: Two times a day (BID) | INTRAVENOUS | Status: DC
  Administered 2021-04-23 – 2021-04-24 (×3): 40 mg via INTRAVENOUS
  Filled 2021-04-23 (×2): qty 40

## 2021-04-23 MED ORDER — ENSURE ENLIVE PO LIQD
237.0000 mL | Freq: Three times a day (TID) | ORAL | Status: DC
  Administered 2021-04-23 – 2021-04-30 (×14): 237 mL via ORAL

## 2021-04-23 MED ORDER — SODIUM CHLORIDE 0.9% IV SOLUTION: Freq: Once | INTRAVENOUS | Status: AC

## 2021-04-23 MED ORDER — ALUM & MAG HYDROXIDE-SIMETH 200-200-20 MG/5ML PO SUSP
15.0000 mL | Freq: Four times a day (QID) | ORAL | Status: DC | PRN
  Administered 2021-04-23: 15 mL via ORAL
  Filled 2021-04-23: qty 30

## 2021-04-23 MED ORDER — PHENOL 1.4 % MT LIQD
1.0000 | OROMUCOSAL | Status: DC | PRN
  Administered 2021-04-23: 1 via OROMUCOSAL
  Filled 2021-04-23 (×2): qty 177

## 2021-04-23 MED ORDER — CHLORHEXIDINE GLUCONATE CLOTH 2 % EX PADS
6.0000 | MEDICATED_PAD | Freq: Every day | CUTANEOUS | Status: DC
  Administered 2021-04-23 – 2021-04-30 (×8): 6 via TOPICAL

## 2021-04-23 NOTE — Progress Notes (Signed)
Date and time results received: 04/23/21 0620 (use smartphrase ".now" to insert current time)  Test: hgb-6.5, WBC-0.8, Neutrophil-0.2 Critical Value: hgb-6.5  Name of Provider Notified: Kennon Holter, NP  Orders Received? Or Actions Taken?: Responded

## 2021-04-23 NOTE — Progress Notes (Signed)
°   04/23/21 2229  Vitals  Temp 99.4 F (37.4 C)  Temp Source Oral  BP 94/61  MAP (mmHg) 72  BP Location Right Arm  BP Method Automatic  Patient Position (if appropriate) Lying  Pulse Rate (!) 133  Pulse Rate Source Monitor  Resp 18  MEWS COLOR  MEWS Score Color Red  Oxygen Therapy  SpO2 99 %  O2 Device Room Air  MEWS Score  MEWS Temp 0  MEWS Systolic 1  MEWS Pulse 3  MEWS RR 0  MEWS LOC 0  MEWS Score 4   Rapid response nurse knows about patient. Patient currently in red mews. Reached out to NP blount about MEWS change and situation.

## 2021-04-23 NOTE — Progress Notes (Signed)
Initial Nutrition Assessment  INTERVENTION:   -Ensure Plus High Protein po TID, each supplement provides 350 kcal and 20 grams of protein.   -Multivitamin with minerals daily  NUTRITION DIAGNOSIS:   Increased nutrient needs related to cancer and cancer related treatments as evidenced by estimated needs.  GOAL:   Patient will meet greater than or equal to 90% of their needs  MONITOR:   PO intake, Supplement acceptance, Labs, Weight trends, Skin, I & O's  REASON FOR ASSESSMENT:   Consult Assessment of nutrition requirement/status  ASSESSMENT:   46 y.o. female with past medical history of breast cancer with metastasis was sent from oncology office with complaints of sore throat, fatigue weakness and fever. Admitted for neutropenic fever.  Patient in room, no family at bedside. Pt drowsy and kept falling asleep. Did report she has had poor intakes since "this all started". Did not go into much detail as she kept falling asleep. Pt was being treated for thrush. Has had sore throat and difficulty eating. Undergoing chemotherapy for metastatic breast cancer, last treatment 1/19. States she tried yogurt this morning but it is too thick. Does better with thinner liquids. States she was drinking Ensure PTA. Will order vanilla per patient request.  Pt reports UBW of 175 lbs. Thinks she has lost 15 lbs.  Per weight records, pt has lost 13 lbs since 01/25/21 (7% wt loss x 3 months, significant for time frame).   Medications: KLOR-CON, Magic mouthwash PRN  Labs reviewed:  Low Na, K  NUTRITION - FOCUSED PHYSICAL EXAM:  Declined given trying to sleep and still having pain.  Flowsheet Row Most Recent Value  Edema (RD Assessment) None  Hair Reviewed  Eyes Reviewed  Mouth Reviewed  [dry mouth] thrush       Diet Order:   Diet Order             Diet full liquid Room service appropriate? Yes; Fluid consistency: Thin  Diet effective now                   EDUCATION NEEDS:    No education needs have been identified at this time  Skin:  Skin Assessment: Skin Integrity Issues: Skin Integrity Issues:: Stage II, Incisions Stage II: mid sacrum Incisions: 1/18: rt chest  Last BM:  1/26 -type 6  Height:   Ht Readings from Last 1 Encounters:  04/19/21 5\' 3"  (1.6 m)    Weight:   Wt Readings from Last 1 Encounters:  04/23/21 76.1 kg   BMI:  Body mass index is 29.72 kg/m.  Estimated Nutritional Needs:   Kcal:  1900-2100  Protein:  95-105g  Fluid:  2.1L/day  Clayton Bibles, MS, RD, LDN Inpatient Clinical Dietitian Contact information available via Amion

## 2021-04-23 NOTE — Progress Notes (Signed)
PROGRESS NOTE  Paige Perry VOH:[id number] DOB: 06-02-1975 DOA: 04/22/2021 PCP: [name], NP   LOS: 1 day   Brief narrative:  Paige Perry is a 46 y.o. female with past medical history of breast cancer with metastasis was sent from oncology office with complaints of sore throat, fatigue weakness and fever.  In the ED, patient was noted to have a fever with mild tachycardia.  Labs done in the outpatient clinic on 04/22/2021 showed a WBC count of 0.7 with hemoglobin of 7.6 and platelet of 196.  Magnesium of 1.9.  Normal level was low at 126, potassium of 2.9 and chloride of 90.  Creatinine at 0.4.  AST and ALT were elevated at 150 and 146 respectively.  Alkaline phos was elevated at 913 and total bilirubin 13.5.  Patient was then considered for admission to hospital for neutropenic fever.   Assessment/Plan:  Principal Problem:   Neutropenic fever ([place]) Active Problems:   Malignant neoplasm of upper-outer quadrant of left breast in female, estrogen receptor positive (HCC)   Liver metastases (HCC)   Pressure injury of skin  Severe neutropenia with neutropenic fever.  Blood cultures negative in less than 12 hours.  Urine culture pending.    Start Granix 40 mcg daily until Saline more than 1000 as per oncology recommendation.  We will continue with IV Diflucan, cefepime.  Monitor closely.  Temperature max noted at 100.8 F.  Patient is  tachycardic.  Blood pressure is within normal range but marginally low.  X-ray of the chest shows moderate right pleural effusion similar in appearance to previous exam.   Oral ulcerations, sore throat.  Continue Magic mouthwash and IV Diflucan.  Negative MRSA screen.   Anemia of chronic disease malignancy.  Hemoglobin of 6.5 today.  Will transfuse PRBC today.    History of left breast cancer with liver metastasis.  Being followed by oncology as outpatient.  Follow oncology recommendations.   Severe hypokalemia.  Potassium of 2.9 from BMP in the cancer  center.  Potassium has improved to 3.4 today.  We will continue to replenish.  Hyponatremia.  Sodium of 126 on presentation.  Has improved to 129 on normal saline.  Continue with IV hydration.  Hypoalbuminemia, elevated LFTs with elevated bilirubin..  Patient does have liver metastasis.  We will monitor CMP    Severe fatigue, weakness.  Could be multifactorial from electrolyte imbalance, volume depletion, neutropenic fever.  Continue IV hydration, antibiotic.  We will get PT evaluation.   Stage II pressure ulceration.  Present admission.  Will need wound care while in the hospital.  DVT prophylaxis: enoxaparin (LOVENOX) injection 40 mg Start: 04/22/21 2200 SCDs Start: 04/22/21 1716   Code Status: Full code  Family Communication:  Spoke with the patient at bedside.  Unable to reach the patient's sister Ms. [name] on the phone today  Status is: Inpatient  Remains inpatient appropriate because: Neutropenic fever, electrolyte imbalance, IV antibiotic    Consultants: Oncology notified  Procedures: None  Anti-infectives:  Diflucan, cefepime 1/26>  Anti-infectives (From admission, onward)    Start     Dose/Rate Route Frequency Ordered Stop   04/22/21 2000  fluconazole (DIFLUCAN) IVPB 100 mg        100 mg 50 mL/hr over 60 Minutes Intravenous Every 24 hours 04/22/21 1724     04/22/21 1800  ceFEPIme (MAXIPIME) 2 g in sodium chloride 0.9 % 100 mL IVPB        2 g 200 mL/hr over 30 Minutes Intravenous Every  8 hours 04/22/21 1716         Subjective: Today, patient was seen and examined at bedside.  Patient states that she was able to sleep okay in the nighttime.  Denies any nausea vomiting or diarrhea.  Has mild cough.  Objective: Vitals:   04/23/21 0404 04/23/21 0745  BP: 106/70 100/66  Pulse: (!) 137 (!) 137  Resp: 18 20  Temp: 97.7 F (36.5 C) 99 F (37.2 C)  SpO2: 95% 95%    Intake/Output Summary (Last 24 hours) at 04/23/2021 1106 Last data filed at 04/23/2021  0410 Gross per 24 hour  Intake 1885.22 ml  Output 250 ml  Net 1635.22 ml   Filed Weights   04/23/21 0410  Weight: 76.1 kg   Body mass index is 29.72 kg/m.   Physical Exam:  GENERAL: Patient is alert awake and oriented. Not in obvious distress.  On nasal cannula oxygen HENT: No scleral pallor or icterus. Pupils equally reactive to light. Oral mucosa is moist, oral mucosa with some angular ulceration, whitish discoloration of the mucosa. NECK: is supple, no gross swelling noted. CHEST: Diminished breath sounds bilaterally.  Right chest wall port cath site healthy.   CVS: S1 and S2 heard, no murmur. Regular rate and rhythm.  ABDOMEN: Soft, non-tender, bowel sounds are present. EXTREMITIES: No edema. CNS: Cranial nerves are intact. No focal motor deficits. SKIN: warm and dry without rashes.  Sacral decubitus ulceration present on admission.  Data Review: I have personally reviewed the following laboratory data and studies,  CBC: Recent Labs  Lab 04/19/21 0857 04/22/21 1144 04/23/21 0500  WBC 3.6* 0.7* 0.8*  NEUTROABS 3.2 0.1* 0.2*  HGB 9.7* 7.6* 6.5*  HCT 28.7* 22.8* 20.1*  MCV 85.4 86.0 89.3  PLT 197 196 962   Basic Metabolic Panel: Recent Labs  Lab 04/19/21 0857 04/21/21 0858 04/22/21 1144 04/23/21 0500  NA 121* 125* 126* 129*  K 3.6 2.6* 2.9* 3.4*  CL 84* 90* 90* 98  CO2 24 27 27 22   GLUCOSE 104* 88 97 109*  BUN 11 6 7 8   CREATININE <0.30* 0.38* 0.43* <0.30*  CALCIUM 7.9* 6.8* 7.7* 7.1*  MG  --   --  1.9  --    Liver Function Tests: Recent Labs  Lab 04/19/21 0857 04/21/21 0858 04/22/21 1144 04/23/21 0500  AST 412* 255* 150* 187*  ALT 245* 184* 146* 143*  ALKPHOS 950* 1,194* 913* 609*  BILITOT 21.0* 18.6* 13.5* 12.0*  PROT 6.6 4.4* 5.8* 5.5*  ALBUMIN 1.8* 2.0* 2.3* <1.5*   No results for input(s): LIPASE, AMYLASE in the last 168 hours. No results for input(s): AMMONIA in the last 168 hours. Cardiac Enzymes: No results for input(s): CKTOTAL,  CKMB, CKMBINDEX, TROPONINI in the last 168 hours. BNP (last 3 results) No results for input(s): BNP in the last 8760 hours.  ProBNP (last 3 results) No results for input(s): PROBNP in the last 8760 hours.  CBG: No results for input(s): GLUCAP in the last 168 hours. Recent Results (from the past 240 hour(s))  Culture, blood (Routine X 2) w Reflex to ID Panel     Status: None (Preliminary result)   Collection Time: 04/22/21  5:36 PM   Specimen: Porta Cath; Blood  Result Value Ref Range Status   Specimen Description   Final    PORTA CATH Performed at [facility], [place] [address] [place], [place] [address]    Special Requests   Final    BOTTLES DRAWN AEROBIC AND  ANAEROBIC Blood Culture adequate volume Performed at [facility] [address] [place], [place] [address]    Culture   Final    NO GROWTH < 12 HOURS Performed at [facility] [address], [place], [place] [address]    Report Status PENDING  Incomplete  Culture, blood (Routine X 2) w Reflex to ID Panel     Status: None (Preliminary result)   Collection Time: 04/22/21  5:37 PM   Specimen: Right Antecubital; Blood  Result Value Ref Range Status   Specimen Description   Final    RIGHT ANTECUBITAL Performed at [facility] [address] [place], [place] [address]    Special Requests   Final    BOTTLES DRAWN AEROBIC ONLY Blood Culture adequate volume Performed at [facility] [address], [place], [place] [address]    Culture   Final    NO GROWTH < 12 HOURS Performed at [facility] [address], [place], [place] [address]    Report Status PENDING  Incomplete  Resp Panel by RT-PCR (Flu A&B, Covid) Nasopharyngeal Swab     Status: None   Collection Time: 04/22/21  6:04 PM   Specimen: Nasopharyngeal Swab; Nasopharyngeal(NP) swabs in vial transport medium  Result Value Ref Range Status   SARS Coronavirus 2 by RT PCR  NEGATIVE NEGATIVE Final    Comment: (NOTE) SARS-CoV-2 target nucleic acids are NOT DETECTED.  The SARS-CoV-2 RNA is generally detectable in upper respiratory specimens during the acute phase of infection. The lowest concentration of SARS-CoV-2 viral copies this assay can detect is 138 copies/mL. A negative result does not preclude SARS-Cov-2 infection and should not be used as the sole basis for treatment or other patient management decisions. A negative result may occur with  improper specimen collection/handling, submission of specimen other than nasopharyngeal swab, presence of viral mutation(s) within the areas targeted by this assay, and inadequate number of viral copies(<138 copies/mL). A negative result must be combined with clinical observations, patient history, and epidemiological information. The expected result is Negative.  Fact Sheet for Patients:  EntrepreneurPulse.com.au  Fact Sheet for Healthcare Providers:  IncredibleEmployment.be  This test is no t yet approved or cleared by the [place] FDA and  has been authorized for detection and/or diagnosis of SARS-CoV-2 by FDA under an Emergency Use Authorization (EUA). This EUA will remain  in effect (meaning this test can be used) for the duration of the COVID-19 declaration under Section 564(b)(1) of the Act, 21 U.S.C.section 360bbb-3(b)(1), unless the authorization is terminated  or revoked sooner.       Influenza A by PCR NEGATIVE NEGATIVE Final   Influenza B by PCR NEGATIVE NEGATIVE Final    Comment: (NOTE) The Xpert Xpress SARS-CoV-2/FLU/RSV plus assay is intended as an aid in the diagnosis of influenza from Nasopharyngeal swab specimens and should not be used as a sole basis for treatment. Nasal washings and aspirates are unacceptable for Xpert Xpress SARS-CoV-2/FLU/RSV testing.  Fact Sheet for Patients: EntrepreneurPulse.com.au  Fact Sheet for  Healthcare Providers: IncredibleEmployment.be  This test is not yet approved or cleared by the [place] FDA and has been authorized for detection and/or diagnosis of SARS-CoV-2 by FDA under an Emergency Use Authorization (EUA). This EUA will remain in effect (meaning this test can be used) for the duration of the COVID-19 declaration under Section 564(b)(1) of the Act, 21 U.S.C. section 360bbb-3(b)(1), unless the authorization is terminated or revoked.  Performed  at [facility], [place] [address] [place], [place] [address]   MRSA Next Gen by PCR, Nasal     Status: None   Collection Time: 04/22/21  6:25 PM   Specimen: Nasal Mucosa; Nasal Swab  Result Value Ref Range Status   MRSA by PCR Next Gen NOT DETECTED NOT DETECTED Final    Comment: (NOTE) The GeneXpert MRSA Assay (FDA approved for NASAL specimens only), is one component of a comprehensive MRSA colonization surveillance program. It is not intended to diagnose MRSA infection nor to guide or monitor treatment for MRSA infections. Test performance is not FDA approved in patients less than 42 years old. Performed at [facility], [place] [address], [place], [place] [address]      Studies: DG CHEST PORT 1 VIEW  Result Date: 04/22/2021 CLINICAL DATA:  Fever. EXAM: PORTABLE CHEST 1 VIEW COMPARISON:  Radiograph 04/14/2021, CT 04/05/2021 FINDINGS: Accessed right chest port in place with tip in the SVC. Hazy opacity throughout the right hemithorax with right pleural effusion, tracking laterally. This partially obscures assessment of the right lung. Allowing for this, no focal consolidation to suggest pneumonia. Mild left basilar atelectasis. Stable heart size and mediastinal contours. No pneumothorax. Left axillary surgical clips. IMPRESSION: Moderate right pleural effusion, similar in appearance to prior exam. Mild left basilar atelectasis. Electronically Signed   By: Keith Rake M.D.   On: 04/22/2021 18:18      Flora Lipps, MD  [facility] 04/23/2021  If 7PM-7AM, please contact night-coverage

## 2021-04-23 NOTE — Progress Notes (Signed)
°   04/23/21 0934  Assess: MEWS Score  Level of Consciousness Alert  Assess: MEWS Score  MEWS Temp 0  MEWS Systolic 1  MEWS Pulse 3  MEWS RR 0  MEWS LOC 0  MEWS Score 4  MEWS Score Color Red  Assess: if the MEWS score is Yellow or Red  Were vital signs taken at a resting state? Yes  Focused Assessment No change from prior assessment  Does the patient meet 2 or more of the SIRS criteria? No  MEWS guidelines implemented *See Row Information* No, previously red, continue vital signs every 4 hours  Treat  Neuro symptoms relieved by Rest (encouraged)

## 2021-04-24 DIAGNOSIS — D709 Neutropenia, unspecified: Secondary | ICD-10-CM | POA: Diagnosis not present

## 2021-04-24 DIAGNOSIS — L89152 Pressure ulcer of sacral region, stage 2: Secondary | ICD-10-CM | POA: Diagnosis not present

## 2021-04-24 DIAGNOSIS — C787 Secondary malignant neoplasm of liver and intrahepatic bile duct: Secondary | ICD-10-CM | POA: Diagnosis not present

## 2021-04-24 DIAGNOSIS — C50412 Malignant neoplasm of upper-outer quadrant of left female breast: Secondary | ICD-10-CM | POA: Diagnosis not present

## 2021-04-24 LAB — COMPREHENSIVE METABOLIC PANEL
ALT: 126 U/L — ABNORMAL HIGH (ref 0–44)
AST: 147 U/L — ABNORMAL HIGH (ref 15–41)
Albumin: <1.5 g/dL — ABNORMAL LOW (ref 3.5–5.0)
Alkaline Phosphatase: 466 U/L — ABNORMAL HIGH (ref 38–126)
Anion gap: 8 (ref 5–15)
BUN: 8 mg/dL (ref 6–20)
CO2: 24 mmol/L (ref 22–32)
Calcium: 7.1 mg/dL — ABNORMAL LOW (ref 8.9–10.3)
Chloride: 100 mmol/L (ref 98–111)
Creatinine, Ser: 0.3 mg/dL — ABNORMAL LOW (ref 0.44–1.00)
GFR, Estimated: >60 mL/min (ref >60)
Glucose, Bld: 103 mg/dL — ABNORMAL HIGH (ref 70–99)
Potassium: 2.5 mmol/L — CL (ref 3.5–5.1)
Sodium: 132 mmol/L — ABNORMAL LOW (ref 135–145)
Total Bilirubin: 7.4 mg/dL — ABNORMAL HIGH (ref 0.3–1.2)
Total Protein: 5.3 g/dL — ABNORMAL LOW (ref 6.5–8.1)

## 2021-04-24 LAB — CBC WITH DIFFERENTIAL/PLATELET
Abs Immature Granulocytes: 0.44 10*3/uL — ABNORMAL HIGH (ref 0.00–0.07)
Basophils Absolute: 0 10*3/uL (ref 0.0–0.1)
Basophils Relative: 1 %
Eosinophils Absolute: 0 10*3/uL (ref 0.0–0.5)
Eosinophils Relative: 0 %
HCT: 30.6 % — ABNORMAL LOW (ref 36.0–46.0)
Hemoglobin: 10.3 g/dL — ABNORMAL LOW (ref 12.0–15.0)
Immature Granulocytes: 13 %
Lymphocytes Relative: 12 %
Lymphs Abs: 0.4 10*3/uL — ABNORMAL LOW (ref 0.7–4.0)
MCH: 29.8 pg (ref 26.0–34.0)
MCHC: 33.7 g/dL (ref 30.0–36.0)
MCV: 88.4 fL (ref 80.0–100.0)
Monocytes Absolute: 0.4 10*3/uL (ref 0.1–1.0)
Monocytes Relative: 11 %
Neutro Abs: 2.2 10*3/uL (ref 1.7–7.7)
Neutrophils Relative %: 63 %
Platelets: 181 10*3/uL (ref 150–400)
RBC: 3.46 MIL/uL — ABNORMAL LOW (ref 3.87–5.11)
RDW: 15.5 % (ref 11.5–15.5)
WBC: 3.5 10*3/uL — ABNORMAL LOW (ref 4.0–10.5)
nRBC: 24.6 % — ABNORMAL HIGH (ref 0.0–0.2)

## 2021-04-24 LAB — URINE CULTURE: Culture: <10000 — AB

## 2021-04-24 MED ORDER — SODIUM CHLORIDE 0.9 % IV BOLUS
1000.0000 mL | Freq: Once | INTRAVENOUS | Status: AC
  Administered 2021-04-24: 1000 mL via INTRAVENOUS

## 2021-04-24 MED ORDER — FLUCONAZOLE 100 MG PO TABS
100.0000 mg | ORAL_TABLET | Freq: Every day | ORAL | Status: DC
  Administered 2021-04-24 – 2021-04-29 (×6): 100 mg via ORAL
  Filled 2021-04-24 (×7): qty 1

## 2021-04-24 MED ORDER — POTASSIUM CHLORIDE 10 MEQ/100ML IV SOLN
10.0000 meq | INTRAVENOUS | Status: AC
  Administered 2021-04-24 (×4): 10 meq via INTRAVENOUS
  Filled 2021-04-24 (×2): qty 100

## 2021-04-24 MED ORDER — DIPHENHYDRAMINE HCL 50 MG/ML IJ SOLN
25.0000 mg | Freq: Once | INTRAMUSCULAR | Status: AC
  Administered 2021-04-24: 25 mg via INTRAVENOUS
  Filled 2021-04-24: qty 1

## 2021-04-24 MED ORDER — PANTOPRAZOLE SODIUM 40 MG PO TBEC
40.0000 mg | DELAYED_RELEASE_TABLET | Freq: Two times a day (BID) | ORAL | Status: DC
  Administered 2021-04-24 – 2021-04-30 (×13): 40 mg via ORAL
  Filled 2021-04-24 (×13): qty 1

## 2021-04-24 MED ORDER — SODIUM CHLORIDE 0.9 % IV SOLN: INTRAVENOUS | Status: DC

## 2021-04-24 NOTE — Progress Notes (Signed)
Bladder scan results were 173.

## 2021-04-24 NOTE — Progress Notes (Addendum)
Patient re-triggered red MEWS, was already in in red.  Patient denies pain, no signs of distress, has had HR consistently in the 130's.Patient is fatigued but answers questions appropriately. Sclera is yellow and urine output is tea colored.Continued on red MEWS, charge RN, & Dr. Louanne Belton notified. Fluid bolus ordered.   04/24/21 1023  Assess: MEWS Score  Temp 98.4 F (36.9 C)  BP (!) 88/63  Pulse Rate (!) 130  Resp 18  SpO2 98 %  Assess: MEWS Score  MEWS Temp 0  MEWS Systolic 1  MEWS Pulse 3  MEWS RR 0  MEWS LOC 0  MEWS Score 4  MEWS Score Color Red  Assess: if the MEWS score is Yellow or Red  Were vital signs taken at a resting state? Yes  Focused Assessment No change from prior assessment  Does the patient meet 2 or more of the SIRS criteria? No  MEWS guidelines implemented *See Row Information* No, previously red, continue vital signs every 4 hours  Escalate  MEWS: Escalate Red: discuss with charge nurse/RN and provider, consider discussing with RRT  Notify: Charge Nurse/RN  Name of Charge Nurse/RN Notified Stephan Minister, RN  Date Charge Nurse/RN Notified 04/24/00  Time Charge Nurse/RN Notified 1033  Notify: Provider  Provider Name/Title Dr. Louanne Belton  Date Provider Notified 04/24/21  Time Provider Notified 1505  Notification Type Page  Notification Reason Other (Comment) (Red MEWS)  Assess: SIRS CRITERIA  SIRS Temperature  0  SIRS Pulse 1  SIRS Respirations  0  SIRS WBC 0  SIRS Score Sum  1   s.

## 2021-04-24 NOTE — Progress Notes (Addendum)
PROGRESS NOTE  Paige Perry WFU:[id number] DOB: 1975-11-28 DOA: 04/22/2021 PCP: [name], NP   LOS: 2 days   Brief narrative:  Paige Perry is a 46 y.o. female with past medical history of breast cancer with metastasis was sent from oncology office with complaints of sore throat, fatigue weakness and fever.  In the ED, patient was noted to have a fever with mild tachycardia.  Labs done in the outpatient clinic on 04/22/2021 showed a WBC count of 0.7 with hemoglobin of 7.6 and platelet of 196.  Magnesium of 1.9.  Normal level was low at 126, potassium of 2.9 and chloride of 90.  Creatinine at 0.4.  AST and ALT were elevated at 150 and 146 respectively.  Alkaline phos was elevated at 913 and total bilirubin 13.5.  Patient was then considered for admission to hospital for neutropenic fever.   Assessment/Plan:  Principal Problem:   Neutropenic fever ([place]) Active Problems:   Malignant neoplasm of upper-outer quadrant of left breast in female, estrogen receptor positive (HCC)   Liver metastases (HCC)   Pressure injury of skin  Severe neutropenia with neutropenic fever.  Blood cultures negative in 2 days.  Urine culture pending.   Patient received a Granix 40 mcg daily until [place] more than 1000.  On Diflucan, IV cefepime.  Monitor closely.  Temperature max at 99.4 F.  Still tachycardic and blood pressure was low earlier.  We will give her 1 L of IV fluid bolus.  Urine appears to be very dark.  X-ray of the chest shows moderate right pleural effusion similar in appearance to previous exam.  On 2 L of oxygen by nasal cannula.   Oral ulcerations, sore throat.  Continue Magic mouthwash and  Diflucan.  Negative MRSA screen.   Anemia of chronic disease malignancy.  Hemoglobin of 10.3 today.  Received 2 units of packed RBC yesterday.    History of left breast cancer with liver metastasis.  Being followed by oncology as outpatient.  Follow oncology recommendations.   Severe hypokalemia.   Potassium of 2.8 today.  Received 40 meq potassium this morning.  We will repeat in the evening.  Add oral potassium as well.  Check potassium levels in a.m.  Hyponatremia.  Sodium of 126 on presentation.  Has improved to 132 with normal saline.  Continue with IV hydration.    Hypoalbuminemia, elevated LFTs with elevated bilirubin..  Patient does have liver metastasis.  We will monitor CMP    Severe fatigue, weakness.  Could be multifactorial from electrolyte imbalance, volume depletion, neutropenic fever.  Continue IV hydration, IV antibiotic.  We will get PT evaluation when more stable..   Stage II pressure ulceration.  Present admission.  Will need wound care while in the hospital.  DVT prophylaxis: enoxaparin (LOVENOX) injection 40 mg Start: 04/22/21 2200 SCDs Start: 04/22/21 1716   Code Status: Full code  Family Communication:  Spoke with the patient at bedside.  I again tried to reach the patient's sister on the phone but was unable to reach her.  I  spoke with the patient's mother on the phone and updated her about the clinical condition of the patient.  Status is: Inpatient  Remains inpatient appropriate because: Neutropenic fever, electrolyte imbalance, IV antibiotic    Consultants: Oncology notified  Procedures: None  Anti-infectives:  Diflucan, cefepime 1/26>  Anti-infectives (From admission, onward)    Start     Dose/Rate Route Frequency Ordered Stop   04/24/21 1400  fluconazole (DIFLUCAN) tablet 100 mg  100 mg Oral Daily 04/24/21 1057     04/22/21 2000  fluconazole (DIFLUCAN) IVPB 100 mg  Status:  Discontinued        100 mg 50 mL/hr over 60 Minutes Intravenous Every 24 hours 04/22/21 1724 04/24/21 1057   04/22/21 1800  ceFEPIme (MAXIPIME) 2 g in sodium chloride 0.9 % 100 mL IVPB        2 g 200 mL/hr over 30 Minutes Intravenous Every 8 hours 04/22/21 1716         Subjective: Today, patient was seen and examined bedside.  Nursing staff reported that  her blood pressure was slightly on the lower side.  Has felt little better with swallowing but does have some loose stools.  Complains of burning in the mouth but is able to eat better.   Objective: Vitals:   04/24/21 0630 04/24/21 1023  BP: 105/70 (!) 88/63  Pulse: (!) 134 (!) 130  Resp: 18 18  Temp: 98.1 F (36.7 C) 98.4 F (36.9 C)  SpO2: 98% 98%    Intake/Output Summary (Last 24 hours) at 04/24/2021 1207 Last data filed at 04/24/2021 0500 Gross per 24 hour  Intake 2140.44 ml  Output 450 ml  Net 1690.44 ml    Filed Weights   04/23/21 0410 04/24/21 0630  Weight: 76.1 kg 81.9 kg   Body mass index is 31.98 kg/m.   Physical Exam:  General: Overweight, not in obvious distress, on nasal cannula oxygen HENT:   No scleral pallor or icterus noted. Oral mucosa is moist.  Angular ulceration.  Oral mucosa with whitish spots Chest:  Clear breath sounds.  Diminished breath sounds bilaterally. No crackles or wheezes.  Right chest wall Port-A-Cath in place without induration or erythema. CVS: S1 &S2 heard. No murmur.  Regular rate and rhythm. Abdomen: Soft, nontender, nondistended.  Bowel sounds are heard.   Extremities: No cyanosis, clubbing or edema.  Peripheral pulses are palpable. Psych: Alert, awake and oriented, normal mood CNS:  No cranial nerve deficits.  Power equal in all extremities.  Generalized weakness noted. Skin: Warm and dry.  Sacral decubitus ulceration present on admission.   Data Review: I have personally reviewed the following laboratory data and studies,  CBC: Recent Labs  Lab 04/19/21 0857 04/22/21 1144 04/23/21 0500 04/23/21 1957 04/24/21 0613  WBC 3.6* 0.7* 0.8*  --  3.5*  NEUTROABS 3.2 0.1* 0.2*  --  2.2  HGB 9.7* 7.6* 6.5* 10.8* 10.3*  HCT 28.7* 22.8* 20.1* 31.7* 30.6*  MCV 85.4 86.0 89.3  --  88.4  PLT 197 196 193  --  947    Basic Metabolic Panel: Recent Labs  Lab 04/19/21 0857 04/21/21 0858 04/22/21 1144 04/23/21 0500 04/24/21 0613   NA 121* 125* 126* 129* 132*  K 3.6 2.6* 2.9* 3.4* 2.5*  CL 84* 90* 90* 98 100  CO2 24 27 27 22 24   GLUCOSE 104* 88 97 109* 103*  BUN 11 6 7 8 8   CREATININE <0.30* 0.38* 0.43* <0.30* 0.30*  CALCIUM 7.9* 6.8* 7.7* 7.1* 7.1*  MG  --   --  1.9  --   --     Liver Function Tests: Recent Labs  Lab 04/19/21 0857 04/21/21 0858 04/22/21 1144 04/23/21 0500 04/24/21 0613  AST 412* 255* 150* 187* 147*  ALT 245* 184* 146* 143* 126*  ALKPHOS 950* 1,194* 913* 609* 466*  BILITOT 21.0* 18.6* 13.5* 12.0* 7.4*  PROT 6.6 4.4* 5.8* 5.5* 5.3*  ALBUMIN 1.8* 2.0* 2.3* <1.5* <1.5*  No results for input(s): LIPASE, AMYLASE in the last 168 hours. No results for input(s): AMMONIA in the last 168 hours. Cardiac Enzymes: No results for input(s): CKTOTAL, CKMB, CKMBINDEX, TROPONINI in the last 168 hours. BNP (last 3 results) No results for input(s): BNP in the last 8760 hours.  ProBNP (last 3 results) No results for input(s): PROBNP in the last 8760 hours.  CBG: No results for input(s): GLUCAP in the last 168 hours. Recent Results (from the past 240 hour(s))  Culture, blood (Routine X 2) w Reflex to ID Panel     Status: None (Preliminary result)   Collection Time: 04/22/21  5:36 PM   Specimen: Porta Cath; Blood  Result Value Ref Range Status   Specimen Description   Final    PORTA CATH Performed at [facility], [place] [address], [place], [place] [address]    Special Requests   Final    BOTTLES DRAWN AEROBIC AND ANAEROBIC Blood Culture adequate volume Performed at [facility] [address] [place], [place] [address]    Culture   Final    NO GROWTH 2 DAYS Performed at [facility] [address], [place], [place] [address]    Report Status PENDING  Incomplete  Culture, blood (Routine X 2) w Reflex to ID Panel     Status: None (Preliminary result)   Collection Time: 04/22/21  5:37 PM   Specimen: Right Antecubital; Blood  Result Value Ref  Range Status   Specimen Description   Final    RIGHT ANTECUBITAL Performed at [facility] [address] [place], [place] [address]    Special Requests   Final    BOTTLES DRAWN AEROBIC ONLY Blood Culture adequate volume Performed at [facility] [address] [place], [place] [address]    Culture   Final    NO GROWTH 2 DAYS Performed at [facility] [address] [place], [place] [address]    Report Status PENDING  Incomplete  Resp Panel by RT-PCR (Flu A&B, Covid) Nasopharyngeal Swab     Status: None   Collection Time: 04/22/21  6:04 PM   Specimen: Nasopharyngeal Swab; Nasopharyngeal(NP) swabs in vial transport medium  Result Value Ref Range Status   SARS Coronavirus 2 by RT PCR NEGATIVE NEGATIVE Final    Comment: (NOTE) SARS-CoV-2 target nucleic acids are NOT DETECTED.  The SARS-CoV-2 RNA is generally detectable in upper respiratory specimens during the acute phase of infection. The lowest concentration of SARS-CoV-2 viral copies this assay can detect is 138 copies/mL. A negative result does not preclude SARS-Cov-2 infection and should not be used as the sole basis for treatment or other patient management decisions. A negative result may occur with  improper specimen collection/handling, submission of specimen other than nasopharyngeal swab, presence of viral mutation(s) within the areas targeted by this assay, and inadequate number of viral copies(<138 copies/mL). A negative result must be combined with clinical observations, patient history, and epidemiological information. The expected result is Negative.  Fact Sheet for Patients:  EntrepreneurPulse.com.au  Fact Sheet for Healthcare Providers:  IncredibleEmployment.be  This test is no t yet approved or cleared by the [place] FDA and  has been authorized for detection and/or diagnosis of SARS-CoV-2 by FDA under an Emergency Use  Authorization (EUA). This EUA will remain  in effect (meaning this test can be used) for the duration of the COVID-19 declaration under Section 564(b)(1) of the Act, 21 U.S.C.section 360bbb-3(b)(1),  unless the authorization is terminated  or revoked sooner.       Influenza A by PCR NEGATIVE NEGATIVE Final   Influenza B by PCR NEGATIVE NEGATIVE Final    Comment: (NOTE) The Xpert Xpress SARS-CoV-2/FLU/RSV plus assay is intended as an aid in the diagnosis of influenza from Nasopharyngeal swab specimens and should not be used as a sole basis for treatment. Nasal washings and aspirates are unacceptable for Xpert Xpress SARS-CoV-2/FLU/RSV testing.  Fact Sheet for Patients: EntrepreneurPulse.com.au  Fact Sheet for Healthcare Providers: IncredibleEmployment.be  This test is not yet approved or cleared by the [place] FDA and has been authorized for detection and/or diagnosis of SARS-CoV-2 by FDA under an Emergency Use Authorization (EUA). This EUA will remain in effect (meaning this test can be used) for the duration of the COVID-19 declaration under Section 564(b)(1) of the Act, 21 U.S.C. section 360bbb-3(b)(1), unless the authorization is terminated or revoked.  Performed at [facility], [place] [address], [place], [place] [address]   MRSA Next Gen by PCR, Nasal     Status: None   Collection Time: 04/22/21  6:25 PM   Specimen: Nasal Mucosa; Nasal Swab  Result Value Ref Range Status   MRSA by PCR Next Gen NOT DETECTED NOT DETECTED Final    Comment: (NOTE) The GeneXpert MRSA Assay (FDA approved for NASAL specimens only), is one component of a comprehensive MRSA colonization surveillance program. It is not intended to diagnose MRSA infection nor to guide or monitor treatment for MRSA infections. Test performance is not FDA approved in patients less than 110 years old. Performed at [facility], [place]  [address] [place], [place] [address]       Studies: DG CHEST PORT 1 VIEW  Result Date: 04/22/2021 CLINICAL DATA:  Fever. EXAM: PORTABLE CHEST 1 VIEW COMPARISON:  Radiograph 04/14/2021, CT 04/05/2021 FINDINGS: Accessed right chest port in place with tip in the SVC. Hazy opacity throughout the right hemithorax with right pleural effusion, tracking laterally. This partially obscures assessment of the right lung. Allowing for this, no focal consolidation to suggest pneumonia. Mild left basilar atelectasis. Stable heart size and mediastinal contours. No pneumothorax. Left axillary surgical clips. IMPRESSION: Moderate right pleural effusion, similar in appearance to prior exam. Mild left basilar atelectasis. Electronically Signed   By: Keith Rake M.D.   On: 04/22/2021 18:18      Flora Lipps, MD  [facility] 04/24/2021  If 7PM-7AM, please contact night-coverage

## 2021-04-24 NOTE — Progress Notes (Signed)
°   04/24/21 4196  Provider Notification  Provider Name/Title Dr. Louanne Belton  Date Provider Notified 04/24/21  Time Provider Notified [phone]  Notification Type Page  Notification Reason Critical result  Test performed and critical result K+ 2.5  Date Critical Result Received 04/24/21  Time Critical Result Received 0709  Provider response See new orders  Date of Provider Response 04/24/21  Time of Provider Response [phone]

## 2021-04-24 NOTE — Progress Notes (Signed)
PHARMACIST - PHYSICIAN COMMUNICATION  CONCERNING:  IV to Oral Route Change Policy  IV PPI & IV fluconazole changed to PO route  Eudelia Bunch, Pharm.D 04/24/2021 10:59 AM

## 2021-04-24 NOTE — Progress Notes (Signed)
°   04/24/21 2008  Assess: MEWS Score  Temp 98.2 F (36.8 C)  BP 90/66  Pulse Rate (!) 132  Resp 16  SpO2 100 %  O2 Device Nasal Cannula  Assess: MEWS Score  MEWS Temp 0  MEWS Systolic 1  MEWS Pulse 3  MEWS RR 0  MEWS LOC 0  MEWS Score 4  MEWS Score Color Red  Assess: if the MEWS score is Yellow or Red  Were vital signs taken at a resting state? Yes  Focused Assessment No change from prior assessment  Does the patient meet 2 or more of the SIRS criteria? No  MEWS guidelines implemented *See Row Information* No, previously red, continue vital signs every 4 hours  Treat  MEWS Interventions Administered scheduled meds/treatments;Administered prn meds/treatments  Take Vital Signs  Increase Vital Sign Frequency  Red: Q 1hr X 4 then Q 4hr X 4, if remains red, continue Q 4hrs  Escalate  MEWS: Escalate Red: discuss with charge nurse/RN and provider, consider discussing with RRT  Notify: Charge Nurse/RN  Name of Charge Nurse/RN Notified Larena Glassman, RN  Date Charge Nurse/RN Notified 04/24/21  Time Charge Nurse/RN Notified 1922  Notify: Provider  Provider Name/Title [facility]  Date Provider Notified 04/24/21  Time Provider Notified [phone]  Notification Type Page  Notification Reason Other (Comment) (continuous RED MEWs)  Provider response See new orders  Date of Provider Response 04/24/21  Time of Provider Response 1925  Notify: Rapid Response  Name of Rapid Response RN Notified Genell, RN  Date Rapid Response Notified 04/24/21  Time Rapid Response Notified 2221  Document  Patient Outcome Not stable and remains on department  Progress note created (see row info) Yes  Assess: SIRS CRITERIA  SIRS Temperature  0  SIRS Pulse 1  SIRS Respirations  0  SIRS WBC 0  SIRS Score Sum  1

## 2021-04-25 ENCOUNTER — Encounter (HOSPITAL_COMMUNITY): Payer: Self-pay | Admitting: Internal Medicine

## 2021-04-25 DIAGNOSIS — D709 Neutropenia, unspecified: Secondary | ICD-10-CM | POA: Diagnosis not present

## 2021-04-25 DIAGNOSIS — L89152 Pressure ulcer of sacral region, stage 2: Secondary | ICD-10-CM | POA: Diagnosis not present

## 2021-04-25 DIAGNOSIS — C787 Secondary malignant neoplasm of liver and intrahepatic bile duct: Secondary | ICD-10-CM | POA: Diagnosis not present

## 2021-04-25 DIAGNOSIS — C50412 Malignant neoplasm of upper-outer quadrant of left female breast: Secondary | ICD-10-CM | POA: Diagnosis not present

## 2021-04-25 LAB — BASIC METABOLIC PANEL
Anion gap: 5 (ref 5–15)
BUN: 6 mg/dL (ref 6–20)
CO2: 25 mmol/L (ref 22–32)
Calcium: 6.9 mg/dL — ABNORMAL LOW (ref 8.9–10.3)
Chloride: 100 mmol/L (ref 98–111)
Creatinine, Ser: <0.3 mg/dL — ABNORMAL LOW (ref 0.44–1.00)
Glucose, Bld: 96 mg/dL (ref 70–99)
Potassium: 3.2 mmol/L — ABNORMAL LOW (ref 3.5–5.1)
Sodium: 130 mmol/L — ABNORMAL LOW (ref 135–145)

## 2021-04-25 LAB — COMPREHENSIVE METABOLIC PANEL
ALT: 127 U/L — ABNORMAL HIGH (ref 0–44)
AST: 172 U/L — ABNORMAL HIGH (ref 15–41)
Albumin: <1.5 g/dL — ABNORMAL LOW (ref 3.5–5.0)
Alkaline Phosphatase: 387 U/L — ABNORMAL HIGH (ref 38–126)
Anion gap: 5 (ref 5–15)
BUN: 7 mg/dL (ref 6–20)
CO2: 24 mmol/L (ref 22–32)
Calcium: 6.9 mg/dL — ABNORMAL LOW (ref 8.9–10.3)
Chloride: 101 mmol/L (ref 98–111)
Creatinine, Ser: <0.3 mg/dL — ABNORMAL LOW (ref 0.44–1.00)
Glucose, Bld: 112 mg/dL — ABNORMAL HIGH (ref 70–99)
Potassium: 3 mmol/L — ABNORMAL LOW (ref 3.5–5.1)
Sodium: 130 mmol/L — ABNORMAL LOW (ref 135–145)
Total Bilirubin: 5.7 mg/dL — ABNORMAL HIGH (ref 0.3–1.2)
Total Protein: 4.9 g/dL — ABNORMAL LOW (ref 6.5–8.1)

## 2021-04-25 LAB — CBC WITH DIFFERENTIAL/PLATELET
Abs Immature Granulocytes: 1.44 10*3/uL — ABNORMAL HIGH (ref 0.00–0.07)
Basophils Absolute: 0 10*3/uL (ref 0.0–0.1)
Basophils Relative: 0 %
Eosinophils Absolute: 0 10*3/uL (ref 0.0–0.5)
Eosinophils Relative: 0 %
HCT: 29.3 % — ABNORMAL LOW (ref 36.0–46.0)
Hemoglobin: 9.7 g/dL — ABNORMAL LOW (ref 12.0–15.0)
Immature Granulocytes: 12 %
Lymphocytes Relative: 7 %
Lymphs Abs: 0.8 10*3/uL (ref 0.7–4.0)
MCH: 29.5 pg (ref 26.0–34.0)
MCHC: 33.1 g/dL (ref 30.0–36.0)
MCV: 89.1 fL (ref 80.0–100.0)
Monocytes Absolute: 0.7 10*3/uL (ref 0.1–1.0)
Monocytes Relative: 6 %
Neutro Abs: 9.4 10*3/uL — ABNORMAL HIGH (ref 1.7–7.7)
Neutrophils Relative %: 75 %
Platelets: 162 10*3/uL (ref 150–400)
RBC: 3.29 MIL/uL — ABNORMAL LOW (ref 3.87–5.11)
RDW: 16 % — ABNORMAL HIGH (ref 11.5–15.5)
WBC: 12.4 10*3/uL — ABNORMAL HIGH (ref 4.0–10.5)
nRBC: 8.2 % — ABNORMAL HIGH (ref 0.0–0.2)

## 2021-04-25 LAB — PHOSPHORUS: Phosphorus: <1 mg/dL — CL (ref 2.5–4.6)

## 2021-04-25 LAB — MAGNESIUM
Magnesium: 1.9 mg/dL (ref 1.7–2.4)
Magnesium: 1.7 mg/dL (ref 1.7–2.4)

## 2021-04-25 MED ORDER — POTASSIUM PHOSPHATES 15 MMOLE/5ML IV SOLN
30.0000 mmol | Freq: Once | INTRAVENOUS | Status: AC
  Administered 2021-04-25: 30 mmol via INTRAVENOUS
  Filled 2021-04-25: qty 10

## 2021-04-25 MED ORDER — POTASSIUM CHLORIDE CRYS ER 20 MEQ PO TBCR: 40.0000 meq | EXTENDED_RELEASE_TABLET | Freq: Two times a day (BID) | ORAL | Status: DC

## 2021-04-25 MED ORDER — MAGNESIUM SULFATE 2 GM/50ML IV SOLN
2.0000 g | Freq: Once | INTRAVENOUS | Status: AC
  Administered 2021-04-25: 2 g via INTRAVENOUS
  Filled 2021-04-25: qty 50

## 2021-04-25 MED ORDER — POTASSIUM CHLORIDE 20 MEQ PO PACK
40.0000 meq | PACK | Freq: Two times a day (BID) | ORAL | Status: DC
  Filled 2021-04-25: qty 2

## 2021-04-25 MED ORDER — OXYMETAZOLINE HCL 0.05 % NA SOLN
1.0000 | Freq: Two times a day (BID) | NASAL | Status: AC
  Administered 2021-04-25 – 2021-04-27 (×4): 1 via NASAL
  Filled 2021-04-25: qty 15

## 2021-04-25 MED ORDER — DIPHENHYDRAMINE HCL 25 MG PO CAPS
25.0000 mg | ORAL_CAPSULE | Freq: Four times a day (QID) | ORAL | Status: DC | PRN
  Administered 2021-04-25: 25 mg via ORAL
  Filled 2021-04-25 (×3): qty 1

## 2021-04-25 MED ORDER — DIPHENHYDRAMINE HCL 50 MG/ML IJ SOLN
25.0000 mg | Freq: Once | INTRAMUSCULAR | Status: AC
  Administered 2021-04-25: 25 mg via INTRAVENOUS
  Filled 2021-04-25: qty 1

## 2021-04-25 MED ORDER — POTASSIUM CHLORIDE CRYS ER 20 MEQ PO TBCR: 40.0000 meq | EXTENDED_RELEASE_TABLET | ORAL | Status: DC

## 2021-04-25 MED ORDER — POTASSIUM CHLORIDE CRYS ER 20 MEQ PO TBCR
40.0000 meq | EXTENDED_RELEASE_TABLET | Freq: Two times a day (BID) | ORAL | Status: DC
  Administered 2021-04-25: 40 meq via ORAL
  Filled 2021-04-25 (×2): qty 2

## 2021-04-25 MED ORDER — SODIUM CHLORIDE 0.9 % IV SOLN: INTRAVENOUS | Status: AC

## 2021-04-25 NOTE — Progress Notes (Signed)
PROGRESS NOTE  Paige Perry [id number]:[id number] DOB: [birth year] DOA: 04/22/2021 PCP: [name], NP   LOS: 3 days   Brief narrative:  Paige Perry is a 46 y.o. female with past medical history of breast cancer with metastasis was sent from oncology office with complaints of sore throat, fatigue weakness and fever.  In the ED, patient was noted to have a fever with mild tachycardia.  Labs done in the outpatient clinic on 04/22/2021 showed a WBC count of 0.7 with hemoglobin of 7.6 and platelet of 196.  Magnesium of 1.9.  Normal level was low at 126, potassium of 2.9 and chloride of 90.  Creatinine at 0.4.  AST and ALT were elevated at 150 and 146 respectively.  Alkaline phos was elevated at 913 and total bilirubin 13.5.  Patient was then considered for admission to hospital for neutropenic fever.   Assessment/Plan:  Principal Problem:   Neutropenic fever ([place]) Active Problems:   Malignant neoplasm of upper-outer quadrant of left breast in female, estrogen receptor positive (HCC)   Liver metastases (HCC)   Pressure injury of skin  Severe neutropenia with neutropenic fever.  Blood cultures negative in 3 days.  Urine culture with insignificant colonies.   Patient received a Granix 40 mcg daily until [place] more than 1000.  On Diflucan,.  Received IV cefepime until yesterday but started to develop generalized rash.  Temperature max at 98.4 F.  CV 1 L of IV fluid bolus yesterday and was continued on 150 mill of normal saline.  Still tachycardic but urine appears to be very dark with overall diminished output.[place] of the chest shows moderate right pleural effusion similar in appearance to previous exam.  On 2 L of oxygen by nasal cannula.  Borderline hypotension.  We will continue with IV fluids.  Poor oral intake.  Urine output has improved after IV fluids.  Diffuse erythematous rash over the body likely cefepime induced.  Continue supportive care with Benadryl.  Discontinue cefepime at this  time since cultures have been negative so far.   Oral ulcerations, sore throat.  Continue Magic mouthwash and  Diflucan.  Negative MRSA screen..  Patient feels that her ears feels stuffed.  We will try nasal decongestant.   Anemia of chronic disease malignancy.  Hemoglobin of 9.7 today..  Received 2 units of packed RBC on 04/23/2021  History of left breast cancer with liver metastasis.  Being followed by oncology as outpatient.  Follow oncology recommendations.   Severe hypokalemia.  Latest potassium was 3.0.  We will continue with K-Phos supplementation today. Check potassium levels in a.m. we will add oral potassium as well  Hypophosphatemia.  We will replace with K-Phos IV.  Check levels in a.m.  Hyponatremia.  Sodium of 126 on presentation.  Has improved to 130 with normal saline.  Continue with IV hydration.    Hypoalbuminemia, elevated LFTs with elevated bilirubin..  Patient does have liver metastasis.  Bilirubin has slightly improved   Severe fatigue, weakness.  Could be multifactorial from electrolyte imbalance, volume depletion, neutropenic fever.  Continue IV hydration, IV antibiotic.  Pending PT evaluation    Stage II pressure ulceration.  Present admission.  Will need wound care while in the hospital.  DVT prophylaxis: enoxaparin (LOVENOX) injection 40 mg Start: 04/22/21 2200 SCDs Start: 04/22/21 1716   Code Status: Full code  Family Communication:   I  spoke with the patient's mother on the phone and updated her about the clinical condition of the patient on  04/24/2021.  Status is: Inpatient  Remains inpatient appropriate because: Neutropenic fever, electrolyte imbalances,    Consultants: Oncology  Procedures: None  Anti-infectives:  Diflucan,  cefepime 1/26>1/29  Anti-infectives (From admission, onward)    Start     Dose/Rate Route Frequency Ordered Stop   04/24/21 1400  fluconazole (DIFLUCAN) tablet 100 mg        100 mg Oral Daily 04/24/21 1057      04/22/21 2000  fluconazole (DIFLUCAN) IVPB 100 mg  Status:  Discontinued        100 mg 50 mL/hr over 60 Minutes Intravenous Every 24 hours 04/22/21 1724 04/24/21 1057   04/22/21 1800  ceFEPIme (MAXIPIME) 2 g in sodium chloride 0.9 % 100 mL IVPB  Status:  Discontinued        2 g 200 mL/hr over 30 Minutes Intravenous Every 8 hours 04/22/21 1716 04/25/21 0741       Subjective: Today, patient was seen and examined at bedside.  Patient states that she did have a generalized rash over her body which is still itching.  Denies any nausea vomiting or abdominal pain.  Denies increasing shortness of breath or chest pain. Needed Benadryl for rash.  Objective: Vitals:   04/25/21 0152 04/25/21 0557  BP: 94/63 92/62  Pulse: (!) 131 (!) 129  Resp: 16 18  Temp: 97.9 F (36.6 C) 98.2 F (36.8 C)  SpO2: 98% 97%    Intake/Output Summary (Last 24 hours) at 04/25/2021 0907 Last data filed at 04/25/2021 0500 Gross per 24 hour  Intake 3773.89 ml  Output 1100 ml  Net 2673.89 ml    Filed Weights   04/23/21 0410 04/24/21 0630  Weight: 76.1 kg 81.9 kg   Body mass index is 31.98 kg/m.   Physical Exam: General: Obese built, not in obvious distress HENT:   No scleral pallor or icterus noted. Oral mucosa is moist with angular ulceration, whitish spots in the mouth..  Chest:    Diminished breath sounds bilaterally.  Port-A-Cath in place. CVS: S1 &S2 heard. No murmur.  Regular rate and rhythm. Abdomen: Soft, nontender, nondistended.  Bowel sounds are heard.   Extremities: No cyanosis, clubbing or edema.  Peripheral pulses are palpable. Psych: Alert, awake and oriented, normal mood CNS:  No cranial nerve deficits.  Power equal in all extremities.  Generalized weakness noted. Skin: Warm and dry.  Sacral decubitus ulceration present on admission.   Data Review: I have personally reviewed the following laboratory data and studies,  CBC: Recent Labs  Lab 04/19/21 0857 04/22/21 1144 04/23/21 0500  04/23/21 1957 04/24/21 0613 04/25/21 0500  WBC 3.6* 0.7* 0.8*  --  3.5* 12.4*  NEUTROABS 3.2 0.1* 0.2*  --  2.2 9.4*  HGB 9.7* 7.6* 6.5* 10.8* 10.3* 9.7*  HCT 28.7* 22.8* 20.1* 31.7* 30.6* 29.3*  MCV 85.4 86.0 89.3  --  88.4 89.1  PLT 197 196 193  --  181 702    Basic Metabolic Panel: Recent Labs  Lab 04/21/21 0858 04/22/21 1144 04/23/21 0500 04/24/21 0613 04/25/21 0500  NA 125* 126* 129* 132* 130*  K 2.6* 2.9* 3.4* 2.5* 3.0*  CL 90* 90* 98 100 101  CO2 27 27 22 24 24   GLUCOSE 88 97 109* 103* 112*  BUN 6 7 8 8 7   CREATININE 0.38* 0.43* <0.30* 0.30* <0.30*  CALCIUM 6.8* 7.7* 7.1* 7.1* 6.9*  MG  --  1.9  --   --  1.9  PHOS  --   --   --   --  <  1.0*    Liver Function Tests: Recent Labs  Lab 04/21/21 0858 04/22/21 1144 04/23/21 0500 04/24/21 0613 04/25/21 0500  AST 255* 150* 187* 147* 172*  ALT 184* 146* 143* 126* 127*  ALKPHOS 1,194* 913* 609* 466* 387*  BILITOT 18.6* 13.5* 12.0* 7.4* 5.7*  PROT 4.4* 5.8* 5.5* 5.3* 4.9*  ALBUMIN 2.0* 2.3* <1.5* <1.5* <1.5*    No results for input(s): LIPASE, AMYLASE in the last 168 hours. No results for input(s): AMMONIA in the last 168 hours. Cardiac Enzymes: No results for input(s): CKTOTAL, CKMB, CKMBINDEX, TROPONINI in the last 168 hours. BNP (last 3 results) No results for input(s): BNP in the last 8760 hours.  ProBNP (last 3 results) No results for input(s): PROBNP in the last 8760 hours.  CBG: No results for input(s): GLUCAP in the last 168 hours. Recent Results (from the past 240 hour(s))  Culture, blood (Routine X 2) w Reflex to ID Panel     Status: None (Preliminary result)   Collection Time: 04/22/21  5:36 PM   Specimen: Porta Cath; Blood  Result Value Ref Range Status   Specimen Description   Final    PORTA CATH Performed at [facility], [place] [address] [place], [place] [address]    Special Requests   Final    BOTTLES DRAWN AEROBIC AND ANAEROBIC Blood Culture adequate volume Performed  at [facility] [address], [place], [place] [address]    Culture   Final    NO GROWTH 3 DAYS Performed at [facility], [place] [address] [place], [place] [address]    Report Status PENDING  Incomplete  Culture, blood (Routine X 2) w Reflex to ID Panel     Status: None (Preliminary result)   Collection Time: 04/22/21  5:37 PM   Specimen: Right Antecubital; Blood  Result Value Ref Range Status   Specimen Description   Final    RIGHT ANTECUBITAL Performed at [facility] [address] [place],  [address]    Special Requests   Final    BOTTLES DRAWN AEROBIC ONLY Blood Culture adequate volume Performed at [facility] [address] [place],  [address]    Culture   Final    NO GROWTH 3 DAYS Performed at [facility], [place] [address], [place],  [address]    Report Status PENDING  Incomplete  Resp Panel by RT-PCR (Flu A&B, Covid) Nasopharyngeal Swab     Status: None   Collection Time: 04/22/21  6:04 PM   Specimen: Nasopharyngeal Swab; Nasopharyngeal(NP) swabs in vial transport medium  Result Value Ref Range Status   SARS Coronavirus 2 by RT PCR NEGATIVE NEGATIVE Final    Comment: (NOTE) SARS-CoV-2 target nucleic acids are NOT DETECTED.  The SARS-CoV-2 RNA is generally detectable in upper respiratory specimens during the acute phase of infection. The lowest concentration of SARS-CoV-2 viral copies this assay can detect is 138 copies/mL. A negative result does not preclude SARS-Cov-2 infection and should not be used as the sole basis for treatment or other patient management decisions. A negative result may occur with  improper specimen collection/handling, submission of specimen other than nasopharyngeal swab, presence of viral mutation(s) within the areas targeted by this assay, and inadequate number of viral copies(<138 copies/mL). A negative result must be combined with clinical  observations, patient history, and epidemiological information. The expected result is Negative.  Fact Sheet for Patients:  EntrepreneurPulse.com.au  Fact Sheet for Healthcare  Providers:  IncredibleEmployment.be  This test is no t yet approved or cleared by the [place] and  has been authorized for detection and/or diagnosis of SARS-CoV-2 by FDA under an Emergency Use Authorization (EUA). This EUA will remain  in effect (meaning this test can be used) for the duration of the COVID-19 declaration under Section 564(b)(1) of the Act, 21 U.S.C.section 360bbb-3(b)(1), unless the authorization is terminated  or revoked sooner.       Influenza A by PCR NEGATIVE NEGATIVE Final   Influenza B by PCR NEGATIVE NEGATIVE Final    Comment: (NOTE) The Xpert Xpress SARS-CoV-2/FLU/RSV plus assay is intended as an aid in the diagnosis of influenza from Nasopharyngeal swab specimens and should not be used as a sole basis for treatment. Nasal washings and aspirates are unacceptable for Xpert Xpress SARS-CoV-2/FLU/RSV testing.  Fact Sheet for Patients: EntrepreneurPulse.com.au  Fact Sheet for Healthcare Providers: IncredibleEmployment.be  This test is not yet approved or cleared by the [place] FDA and has been authorized for detection and/or diagnosis of SARS-CoV-2 by FDA under an Emergency Use Authorization (EUA). This EUA will remain in effect (meaning this test can be used) for the duration of the COVID-19 declaration under Section 564(b)(1) of the Act, 21 U.S.C. section 360bbb-3(b)(1), unless the authorization is terminated or revoked.  Performed at [facility], [place] [address] [place], [place] [address]   MRSA Next Gen by PCR, Nasal     Status: None   Collection Time: 04/22/21  6:25 PM   Specimen: Nasal Mucosa; Nasal Swab  Result Value Ref Range Status   MRSA by PCR Next Gen NOT  DETECTED NOT DETECTED Final    Comment: (NOTE) The GeneXpert MRSA Assay (FDA approved for NASAL specimens only), is one component of a comprehensive MRSA colonization surveillance program. It is not intended to diagnose MRSA infection nor to guide or monitor treatment for MRSA infections. Test performance is not FDA approved in patients less than 71 years old. Performed at [facility], [place] [address], [place], [place] [address]   Urine Culture     Status: Abnormal   Collection Time: 04/23/21  8:03 AM   Specimen: Urine, Clean Catch  Result Value Ref Range Status   Specimen Description   Final    URINE, CLEAN CATCH Performed at [facility], [place] [address], [place], [place] [address]    Special Requests   Final    NONE Performed at [facility], [place] [address] [place], [place] [address]    Culture (A)  Final    <10,000 COLONIES/mL INSIGNIFICANT GROWTH Performed at [facility] [address] [place], [place] [address]    Report Status 04/24/2021 FINAL  Final      Studies: No results found.    Flora Lipps, MD  [facility] 04/25/2021  If 7PM-7AM, please contact night-coverage

## 2021-04-25 NOTE — Progress Notes (Signed)
Patient prefers to have bed elevated in highest position. She was educated on risks. Patient reports that she will not go anywhere. She is alert and oriented at this time.  Will continue to try to encourage her to put bed in lowest position. Roderick Pee

## 2021-04-25 NOTE — Progress Notes (Signed)
MD, patient has generalized rash all over. She tends to c/o itching after receiving her cefepime. Patient has been given 2 doses of IV benadryl for itching/rash.  Spoke with on call NP. Will hold am dose until decision is made whether to continue.Paige Perry

## 2021-04-26 DIAGNOSIS — R5081 Fever presenting with conditions classified elsewhere: Secondary | ICD-10-CM

## 2021-04-26 DIAGNOSIS — D709 Neutropenia, unspecified: Secondary | ICD-10-CM | POA: Diagnosis not present

## 2021-04-26 DIAGNOSIS — E876 Hypokalemia: Secondary | ICD-10-CM | POA: Diagnosis not present

## 2021-04-26 DIAGNOSIS — L89152 Pressure ulcer of sacral region, stage 2: Secondary | ICD-10-CM | POA: Diagnosis not present

## 2021-04-26 LAB — BPAM RBC
Blood Product Expiration Date: 202302212359
Blood Product Expiration Date: 202302212359
ISSUE DATE / TIME: 202301271121
ISSUE DATE / TIME: 202301271439
Unit Type and Rh: 7300
Unit Type and Rh: 7300

## 2021-04-26 LAB — MAGNESIUM: Magnesium: 2.1 mg/dL (ref 1.7–2.4)

## 2021-04-26 LAB — CBC WITH DIFFERENTIAL/PLATELET
Abs Immature Granulocytes: 2 10*3/uL — ABNORMAL HIGH (ref 0.00–0.07)
Basophils Absolute: 0 10*3/uL (ref 0.0–0.1)
Basophils Relative: 0 %
Eosinophils Absolute: 0 10*3/uL (ref 0.0–0.5)
Eosinophils Relative: 0 %
HCT: 28.9 % — ABNORMAL LOW (ref 36.0–46.0)
Hemoglobin: 9.6 g/dL — ABNORMAL LOW (ref 12.0–15.0)
Lymphocytes Relative: 6 %
Lymphs Abs: 1.3 10*3/uL (ref 0.7–4.0)
MCH: 29.4 pg (ref 26.0–34.0)
MCHC: 33.2 g/dL (ref 30.0–36.0)
MCV: 88.7 fL (ref 80.0–100.0)
Metamyelocytes Relative: 2 %
Monocytes Absolute: 0.7 10*3/uL (ref 0.1–1.0)
Monocytes Relative: 3 %
Myelocytes: 6 %
Neutro Abs: 18.1 10*3/uL — ABNORMAL HIGH (ref 1.7–7.7)
Neutrophils Relative %: 82 %
Platelets: 149 10*3/uL — ABNORMAL LOW (ref 150–400)
Promyelocytes Relative: 1 %
RBC: 3.26 MIL/uL — ABNORMAL LOW (ref 3.87–5.11)
RDW: 16.2 % — ABNORMAL HIGH (ref 11.5–15.5)
WBC: 22.1 10*3/uL — ABNORMAL HIGH (ref 4.0–10.5)
nRBC: 2.9 % — ABNORMAL HIGH (ref 0.0–0.2)

## 2021-04-26 LAB — COMPREHENSIVE METABOLIC PANEL
ALT: 138 U/L — ABNORMAL HIGH (ref 0–44)
AST: 164 U/L — ABNORMAL HIGH (ref 15–41)
Albumin: <1.5 g/dL — ABNORMAL LOW (ref 3.5–5.0)
Alkaline Phosphatase: 375 U/L — ABNORMAL HIGH (ref 38–126)
Anion gap: 4 — ABNORMAL LOW (ref 5–15)
BUN: 6 mg/dL (ref 6–20)
CO2: 26 mmol/L (ref 22–32)
Calcium: 7 mg/dL — ABNORMAL LOW (ref 8.9–10.3)
Chloride: 100 mmol/L (ref 98–111)
Creatinine, Ser: <0.3 mg/dL — ABNORMAL LOW (ref 0.44–1.00)
Glucose, Bld: 178 mg/dL — ABNORMAL HIGH (ref 70–99)
Potassium: 3.3 mmol/L — ABNORMAL LOW (ref 3.5–5.1)
Sodium: 130 mmol/L — ABNORMAL LOW (ref 135–145)
Total Bilirubin: 5 mg/dL — ABNORMAL HIGH (ref 0.3–1.2)
Total Protein: 4.8 g/dL — ABNORMAL LOW (ref 6.5–8.1)

## 2021-04-26 LAB — TYPE AND SCREEN
ABO/RH(D): B POS
Antibody Screen: NEGATIVE
Unit division: 0
Unit division: 0

## 2021-04-26 LAB — PHOSPHORUS: Phosphorus: <1 mg/dL — CL (ref 2.5–4.6)

## 2021-04-26 MED ORDER — POTASSIUM CHLORIDE 10 MEQ/100ML IV SOLN
10.0000 meq | INTRAVENOUS | Status: DC
  Administered 2021-04-26: 10 meq via INTRAVENOUS
  Filled 2021-04-26: qty 100

## 2021-04-26 MED ORDER — POTASSIUM PHOSPHATES 15 MMOLE/5ML IV SOLN
30.0000 mmol | Freq: Once | INTRAVENOUS | Status: AC
  Administered 2021-04-26: 30 mmol via INTRAVENOUS
  Filled 2021-04-26: qty 10

## 2021-04-26 MED ORDER — METOPROLOL TARTRATE 25 MG PO TABS
12.5000 mg | ORAL_TABLET | Freq: Two times a day (BID) | ORAL | Status: DC
  Administered 2021-04-26 – 2021-04-29 (×4): 12.5 mg via ORAL
  Filled 2021-04-26 (×9): qty 1

## 2021-04-26 NOTE — Progress Notes (Signed)
PROGRESS NOTE  [name] LKG:[id number] DOB: 1976-01-05 DOA: 04/22/2021 PCP: [name], NP   LOS: 4 days   Brief narrative:  Paige Perry is a 46 y.o. female with past medical history of breast cancer with metastasis was sent from oncology office with complaints of sore throat, fatigue weakness and fever.  In the ED, patient was noted to have a fever with mild tachycardia.  Labs done in the outpatient clinic on 04/22/2021 showed a WBC count of 0.7 with hemoglobin of 7.6 and platelet of 196.  Magnesium of 1.9.  Normal level was low at 126, potassium of 2.9 and chloride of 90.  Creatinine at 0.4.  AST and ALT were elevated at 150 and 146 respectively.  Alkaline phos was elevated at 913 and total bilirubin 13.5.  Patient was then admitted to hospital for neutropenic fever.   Assessment/Plan:  Principal Problem:   Neutropenic fever ([place]) Active Problems:   Malignant neoplasm of upper-outer quadrant of left breast in female, estrogen receptor positive (HCC)   Liver metastases (HCC)   Pressure injury of skin  Severe neutropenia with neutropenic fever.  On presentation.  Has improved at this time.  Received Granix.  Patient also received IV cefepime during hospitalization.  At this time patient is afebrile cultures been negative.  She did have some allergic reaction with cefepime with rash so this has been discontinued.  Blood pressure on the lower side but stable.  Afebrile at this time.  [place] of the chest shows moderate right pleural effusion similar in appearance to previous exam.  On 2 L of oxygen by nasal cannula.  Borderline hypotension.  Patient clinically feels improved.  Currently on Normal saline 150 mill per hour.  Diffuse erythematous rash over the body likely cefepime induced.  Improved with Benadryl.  No further itching.  Cefepime added as an allergy for aspirin   Oral ulcerations, sore throat.  Continue Magic mouthwash and  Diflucan.  Negative MRSA screen..  On  nasal decongestant   Anemia of chronic disease malignancy.  Hemoglobin of 9.6 today.  Received 2 units of packed RBC on 04/23/2021  History of left breast cancer with liver metastasis.  Being followed by oncology as outpatient.  Follow oncology recommendations.   Severe hypokalemia.  Has improved.  Potassium 3.3 today.  We will replenish with K-Phos   Hypophosphatemia.  Still less than 1.  We will continue with K-Phos.  Might need to repeat in the evening again.  Unable to take oral phosphorus and potassium due to oral ulceration  Hyponatremia.  Improved with IV hydration.  Latest sodium 130  Hypoalbuminemia, elevated LFTs with elevated bilirubin..  Patient does have liver metastasis.  Bilirubin has slightly improved, encourage oral hydration and nutrition.   Severe fatigue, weakness.  Could be multifactorial from electrolyte imbalance, volume depletion, neutropenic fever.  Feels little stronger today.  Continue IV hydration, IV antibiotic.  Ambulate in the hall as able.  Pending PT evaluation    Stage II pressure ulceration.  Present admission.  Continue wound care   DVT prophylaxis: enoxaparin (LOVENOX) injection 40 mg Start: 04/22/21 2200 SCDs Start: 04/22/21 1716   Code Status: Full code  Family Communication:   I  spoke with the patient's mother  on 04/24/2021.  Status is: Inpatient  Remains inpatient appropriate because: Treatment for neutropenic fever, electrolyte imbalances, debility/deconditioning   Consultants: Oncology  Procedures: None  Anti-infectives:  Diflucan,  cefepime 1/26>1/29  Anti-infectives (From admission, onward)    Start  Dose/Rate Route Frequency Ordered Stop   04/24/21 1400  fluconazole (DIFLUCAN) tablet 100 mg        100 mg Oral Daily 04/24/21 1057     04/22/21 2000  fluconazole (DIFLUCAN) IVPB 100 mg  Status:  Discontinued        100 mg 50 mL/hr over 60 Minutes Intravenous Every 24 hours 04/22/21 1724 04/24/21 1057   04/22/21 1800   ceFEPIme (MAXIPIME) 2 g in sodium chloride 0.9 % 100 mL IVPB  Status:  Discontinued        2 g 200 mL/hr over 30 Minutes Intravenous Every 8 hours 04/22/21 1716 04/25/21 0741       Subjective: Today, patient was seen and examined at bedside.  Denies much itching in her body.  Wishes to sit up in the bed and do some walking.  Denies any nausea vomiting fever chills or rigor.  Oral ulcerations have improved and he is able to eat better   Objective: Vitals:   04/26/21 0352 04/26/21 0752  BP: 104/77 94/67  Pulse: (!) 133 (!) 130  Resp: 18 20  Temp: 99 F (37.2 C) 98.1 F (36.7 C)  SpO2: 97% 98%    Intake/Output Summary (Last 24 hours) at 04/26/2021 1029 Last data filed at 04/26/2021 1540 Gross per 24 hour  Intake 2402.92 ml  Output 1125 ml  Net 1277.92 ml    Filed Weights   04/23/21 0410 04/24/21 0630  Weight: 76.1 kg 81.9 kg   Body mass index is 31.98 kg/m.   Physical Exam: General: Obese built, not in obvious distress HENT:   No scleral pallor or icterus noted. Oral mucosa with whitish patches, angular ulceration  Chest:  Clear breath sounds.  Diminished breath sounds bilaterally. No crackles or wheezes.  Port-A-Cath in place. CVS: S1 &S2 heard. No murmur.  Regular rate and rhythm. Abdomen: Soft, nontender, nondistended.  Bowel sounds are heard.   Extremities: No cyanosis, clubbing or edema.  Peripheral pulses are palpable. Psych: Alert, awake and oriented, normal mood CNS:  No cranial nerve deficits.  Generalized weakness noted of the extremities. Skin: Warm and dry.  Decubitus ulceration present on admission.  Data Review: I have personally reviewed the following laboratory data and studies,  CBC: Recent Labs  Lab 04/22/21 1144 04/23/21 0500 04/23/21 1957 04/24/21 0613 04/25/21 0500 04/26/21 0533  WBC 0.7* 0.8*  --  3.5* 12.4* 22.1*  NEUTROABS 0.1* 0.2*  --  2.2 9.4* 18.1*  HGB 7.6* 6.5* 10.8* 10.3* 9.7* 9.6*  HCT 22.8* 20.1* 31.7* 30.6* 29.3* 28.9*  MCV  86.0 89.3  --  88.4 89.1 88.7  PLT 196 193  --  181 162 149*    Basic Metabolic Panel: Recent Labs  Lab 04/22/21 1144 04/23/21 0500 04/24/21 0613 04/25/21 0500 04/25/21 2114 04/26/21 0533  NA 126* 129* 132* 130* 130* 130*  K 2.9* 3.4* 2.5* 3.0* 3.2* 3.3*  CL 90* 98 100 101 100 100  CO2 27 22 24 24 25 26   GLUCOSE 97 109* 103* 112* 96 178*  BUN 7 8 8 7 6 6   CREATININE 0.43* <0.30* 0.30* <0.30* <0.30* <0.30*  CALCIUM 7.7* 7.1* 7.1* 6.9* 6.9* 7.0*  MG 1.9  --   --  1.9 1.7 2.1  PHOS  --   --   --  <1.0*  --  <1.0*    Liver Function Tests: Recent Labs  Lab 04/22/21 1144 04/23/21 0500 04/24/21 0613 04/25/21 0500 04/26/21 0533  AST 150* 187* 147* 172* 164*  ALT 146* 143* 126* 127* 138*  ALKPHOS 913* 609* 466* 387* 375*  BILITOT 13.5* 12.0* 7.4* 5.7* 5.0*  PROT 5.8* 5.5* 5.3* 4.9* 4.8*  ALBUMIN 2.3* <1.5* <1.5* <1.5* <1.5*    No results for input(s): LIPASE, AMYLASE in the last 168 hours. No results for input(s): AMMONIA in the last 168 hours. Cardiac Enzymes: No results for input(s): CKTOTAL, CKMB, CKMBINDEX, TROPONINI in the last 168 hours. BNP (last 3 results) No results for input(s): BNP in the last 8760 hours.  ProBNP (last 3 results) No results for input(s): PROBNP in the last 8760 hours.  CBG: No results for input(s): GLUCAP in the last 168 hours. Recent Results (from the past 240 hour(s))  Culture, blood (Routine X 2) w Reflex to ID Panel     Status: None (Preliminary result)   Collection Time: 04/22/21  5:36 PM   Specimen: Porta Cath; Blood  Result Value Ref Range Status   Specimen Description   Final    PORTA CATH Performed at [facility], [place] [address] [place], [place] [address]    Special Requests   Final    BOTTLES DRAWN AEROBIC AND ANAEROBIC Blood Culture adequate volume Performed at [facility] [address], [place], [place] [address]    Culture   Final    NO GROWTH 3 DAYS Performed at [facility], [place] [address], [place], [place] [address]    Report Status PENDING  Incomplete  Culture, blood (Routine X 2) w Reflex to ID Panel     Status: None (Preliminary result)   Collection Time: 04/22/21  5:37 PM   Specimen: Right Antecubital; Blood  Result Value Ref Range Status   Specimen Description   Final    RIGHT ANTECUBITAL Performed at [facility] [address] [place], [place] [address]    Special Requests   Final    BOTTLES DRAWN AEROBIC ONLY Blood Culture adequate volume Performed at [facility] [address], [place], [place] [address]    Culture   Final    NO GROWTH 3 DAYS Performed at [facility], [place] [address], [place], [place] [address]    Report Status PENDING  Incomplete  Resp Panel by RT-PCR (Flu A&B, Covid) Nasopharyngeal Swab     Status: None   Collection Time: 04/22/21  6:04 PM   Specimen: Nasopharyngeal Swab; Nasopharyngeal(NP) swabs in vial transport medium  Result Value Ref Range Status   SARS Coronavirus 2 by RT PCR NEGATIVE NEGATIVE Final    Comment: (NOTE) SARS-CoV-2 target nucleic acids are NOT DETECTED.  The SARS-CoV-2 RNA is generally detectable in upper respiratory specimens during the acute phase of infection. The lowest concentration of SARS-CoV-2 viral copies this assay can detect is 138 copies/mL. A negative result does not preclude SARS-Cov-2 infection and should not be used as the sole basis for treatment or other patient management decisions. A negative result may occur with  improper specimen collection/handling, submission of specimen other than nasopharyngeal swab, presence of viral mutation(s) within the areas targeted by this assay, and inadequate number of viral copies(<138 copies/mL). A negative result must be combined with clinical observations, patient history, and epidemiological information. The expected result is Negative.  Fact Sheet for Patients:   EntrepreneurPulse.com.au  Fact Sheet for Healthcare Providers:  IncredibleEmployment.be  This test is no t yet approved or cleared by the [place] FDA and  has been authorized for detection and/or diagnosis of SARS-CoV-2  by FDA under an Emergency Use Authorization (EUA). This EUA will remain  in effect (meaning this test can be used) for the duration of the COVID-19 declaration under Section 564(b)(1) of the Act, 21 U.S.C.section 360bbb-3(b)(1), unless the authorization is terminated  or revoked sooner.       Influenza A by PCR NEGATIVE NEGATIVE Final   Influenza B by PCR NEGATIVE NEGATIVE Final    Comment: (NOTE) The Xpert Xpress SARS-CoV-2/FLU/RSV plus assay is intended as an aid in the diagnosis of influenza from Nasopharyngeal swab specimens and should not be used as a sole basis for treatment. Nasal washings and aspirates are unacceptable for Xpert Xpress SARS-CoV-2/FLU/RSV testing.  Fact Sheet for Patients: EntrepreneurPulse.com.au  Fact Sheet for Healthcare Providers: IncredibleEmployment.be  This test is not yet approved or cleared by the [place] FDA and has been authorized for detection and/or diagnosis of SARS-CoV-2 by FDA under an Emergency Use Authorization (EUA). This EUA will remain in effect (meaning this test can be used) for the duration of the COVID-19 declaration under Section 564(b)(1) of the Act, 21 U.S.C. section 360bbb-3(b)(1), unless the authorization is terminated or revoked.  Performed at [facility], [place] [address], [place], [place] [address]   MRSA Next Gen by PCR, Nasal     Status: None   Collection Time: 04/22/21  6:25 PM   Specimen: Nasal Mucosa; Nasal Swab  Result Value Ref Range Status   MRSA by PCR Next Gen NOT DETECTED NOT DETECTED Final    Comment: (NOTE) The GeneXpert MRSA Assay (FDA approved for NASAL specimens only), is one  component of a comprehensive MRSA colonization surveillance program. It is not intended to diagnose MRSA infection nor to guide or monitor treatment for MRSA infections. Test performance is not FDA approved in patients less than 78 years old. Performed at [facility], [place] [address], [place], [place] [address]   Urine Culture     Status: Abnormal   Collection Time: 04/23/21  8:03 AM   Specimen: Urine, Clean Catch  Result Value Ref Range Status   Specimen Description   Final    URINE, CLEAN CATCH Performed at [facility], [place] [address] [place], [place] [address]    Special Requests   Final    NONE Performed at [facility], [place] [address] [place], [place] [address]    Culture (A)  Final    <10,000 COLONIES/mL INSIGNIFICANT GROWTH Performed at [place] [address] [place],  [address]    Report Status 04/24/2021 FINAL  Final      Studies: No results found.    Flora Lipps, MD  [facility] 04/26/2021  If 7PM-7AM, please contact night-coverage

## 2021-04-26 NOTE — Evaluation (Signed)
Physical Therapy Evaluation Patient Details Name: Paige Perry MRN: [id number] DOB: 03-Sep-1975 Today's Date: 04/26/2021  History of Present Illness  Patient is 46 y.o. female with breast cancer with liver mets who presented from oncology office for weakness, fatigue, fever, tachycardia, and sore throat. Pt admitted for neutropenic fever. PMH also signficiant for asthma.   Clinical Impression  Paige Perry is 46 y.o. female admitted with above HPI and diagnosis. Patient is currently limited by functional impairments below (see PT problem list). Patient lives with her sister and reports progressive increase in weakness over the last couple weeks with new use of rollator for gait in home. Patient limited by tachycardia and tachypnea  with mobility and required min assist to move bed>chair with RW this session. Patient will benefit from continued skilled PT interventions to address impairments and progress independence with mobility. Anticipate pt can progress to safe mobility level for return home with assist from sister who works from home as well. However if pt does not and sister cannot provide frequent supervision and min assist for mobility she will likely need recs updated for ST rehab at SNF level. Acute PT will follow and progress as able.        Recommendations for follow up therapy are one component of a multi-disciplinary discharge planning process, led by the attending physician.  Recommendations may be updated based on patient status, additional functional criteria and insurance authorization.  Follow Up Recommendations Home health PT    Assistance Recommended at Discharge Frequent or constant Supervision/Assistance  Patient can return home with the following  A lot of help with walking and/or transfers;A little help with bathing/dressing/bathroom;Assist for transportation;Help with stairs or ramp for entrance;Assistance with cooking/housework    Equipment Recommendations None  recommended by PT  Recommendations for Other Services       Functional Status Assessment Patient has had a recent decline in their functional status and demonstrates the ability to make significant improvements in function in a reasonable and predictable amount of time.     Precautions / Restrictions Precautions Precautions: Fall Restrictions Weight Bearing Restrictions: No      Mobility  Bed Mobility Overal bed mobility: Needs Assistance Bed Mobility: Rolling, Supine to Sit Rolling: Supervision   Supine to sit: Min guard, HOB elevated     General bed mobility comments: supervision with pt using bed rail to roll to Rt for bedpand placement. min guard for safety with pt raising trunk and sitting up to EOB.    Transfers Overall transfer level: Needs assistance   Transfers: Sit to/from Stand, Bed to chair/wheelchair/BSC Sit to Stand: Min assist   Step pivot transfers: Min assist       General transfer comment: cues for breathing with rise, pt's RR increases immediately with standing and seated rest proivded after first stand quickly. on second stand pt able to control breath with cues, RR max of 38 vs 50. Min assist to steady and manage walker to take small steps from EOB to recliner. HR elevated to max of 150bpm and recovered back to 120-130's at rest.    Ambulation/Gait                  Stairs            Wheelchair Mobility    Modified Rankin (Stroke Patients Only)       Balance Overall balance assessment: Needs assistance Sitting-balance support: Feet supported Sitting balance-Leahy Scale: Fair     Standing balance support: During functional  activity, Reliant on assistive device for balance, Bilateral upper extremity supported Standing balance-Leahy Scale: Poor                               Pertinent Vitals/Pain Pain Assessment Pain Assessment: No/denies pain    Home Living Family/patient expects to be discharged to:: Private  residence Living Arrangements: Children;Other relatives Available Help at Discharge: Family Type of Home: House Home Access: Stairs to enter Entrance Stairs-Rails: None Entrance Stairs-Number of Steps: 1 Alternate Level Stairs-Number of Steps: flight Home Layout: Two level;Bed/bath upstairs;Able to live on main level with bedroom/bathroom Home Equipment: Rollator (4 wheels);BSC/3in1 Additional Comments: pt's bedroom was upstairs, now on main level, put a bed in living room. has BSC. recently began using rolaltor around admission. states has only used it baout 3 times.    Prior Function Prior Level of Function : Independent/Modified Independent               ADLs Comments: pt reports she was working from home PTA.     Hand Dominance        Extremity/Trunk Assessment                Communication   Communication: No difficulties  Cognition Arousal/Alertness: Awake/alert Behavior During Therapy: WFL for tasks assessed/performed                                            General Comments      Exercises     Assessment/Plan    PT Assessment Patient needs continued PT services  PT Problem List Decreased strength;Decreased activity tolerance;Decreased balance;Decreased mobility;Decreased knowledge of use of DME;Cardiopulmonary status limiting activity       PT Treatment Interventions DME instruction;Gait training;Stair training;Functional mobility training;Therapeutic activities;Therapeutic exercise;Balance training;Patient/family education;Neuromuscular re-education    PT Goals (Current goals can be found in the Care Plan section)  Acute Rehab PT Goals Patient Stated Goal: regain mobility PT Goal Formulation: With patient Time For Goal Achievement: 05/10/21 Potential to Achieve Goals: Good    Frequency Min 3X/week     Co-evaluation               AM-PAC PT "6 Clicks" Mobility  Outcome Measure Help needed turning from your back to  your side while in a flat bed without using bedrails?: None Help needed moving from lying on your back to sitting on the side of a flat bed without using bedrails?: A Little Help needed moving to and from a bed to a chair (including a wheelchair)?: A Little Help needed standing up from a chair using your arms (e.g., wheelchair or bedside chair)?: A Little Help needed to walk in hospital room?: A Little Help needed climbing 3-5 steps with a railing? : Total 6 Click Score: 17    End of Session Equipment Utilized During Treatment: Gait belt;Oxygen Activity Tolerance: Patient tolerated treatment well;Patient limited by fatigue Patient left: in chair;with call bell/phone within reach Nurse Communication: Mobility status PT Visit Diagnosis: Muscle weakness (generalized) (M62.81);Unsteadiness on feet (R26.81);Difficulty in walking, not elsewhere classified (R26.2)    Time: 9702-6378 PT Time Calculation (min) (ACUTE ONLY): 31 min   Charges:   PT Evaluation $PT Eval Moderate Complexity: 1 Mod PT Treatments $Therapeutic Activity: 8-22 mins        Gwynneth Albright PT, DPT Acute Rehabilitation Services  Office [phone] Pager [phone]   Jacques Navy 04/26/2021, 10:21 AM

## 2021-04-26 NOTE — Progress Notes (Signed)
HEMATOLOGY-ONCOLOGY PROGRESS NOTE  SUBJECTIVE: Hospitalization for neutropenic fever with metastatic breast cancer with severe hyperbilirubinemia from extensive liver metastases. Remarkable improvement in her physical strength.  She tells me that she has been able to work with physical therapy and has been up in the chair for a lot of time.  She is able to eat better because of improvement in the sores in the mouth.  OBJECTIVE: REVIEW OF SYSTEMS:   Continued severe generalized fatigue and generalized lower extremity weakness   PHYSICAL EXAMINATION: ECOG PERFORMANCE STATUS: 3 - Symptomatic, >50% confined to bed  Vitals:   04/26/21 1303 04/26/21 1537  BP: 126/60 95/62  Pulse: (!) 126 (!) 124  Resp:  16  Temp:  98.2 F (36.8 C)  SpO2:  98%   Filed Weights   04/23/21 0410 04/24/21 0630  Weight: 167 lb 12.3 oz (76.1 kg) 180 lb 8.9 oz (81.9 kg)    GENERAL:alert, no distress and comfortable SKIN: skin color, texture, turgor are normal, no rashes or significant lesions EYES: normal, Conjunctiva are pink and non-injected, sclera clear OROPHARYNX: Thrush has resolved NECK: supple, thyroid normal size, non-tender, without nodularity LYMPH:  no palpable lymphadenopathy in the cervical, axillary or inguinal LUNGS: clear to auscultation and percussion with normal breathing effort HEART: regular rate & rhythm and no murmurs and no lower extremity edema ABDOMEN: Previously massively enlarged liver has shrunk in size NEURO: alert & oriented x 3 with fluent speech, no focal motor/sensory deficits  LABORATORY DATA:  I have reviewed the data as listed CMP Latest Ref Rng & Units 04/26/2021 04/25/2021 04/25/2021  Glucose 70 - 99 mg/dL 178(H) 96 112(H)  BUN 6 - 20 mg/dL 6 6 7   Creatinine 0.44 - 1.00 mg/dL <0.30(L) <0.30(L) <0.30(L)  Sodium 135 - 145 mmol/L 130(L) 130(L) 130(L)  Potassium 3.5 - 5.1 mmol/L 3.3(L) 3.2(L) 3.0(L)  Chloride 98 - 111 mmol/L 100 100 101  CO2 22 - 32 mmol/L 26 25 24    Calcium 8.9 - 10.3 mg/dL 7.0(L) 6.9(L) 6.9(L)  Total Protein 6.5 - 8.1 g/dL 4.8(L) - 4.9(L)  Total Bilirubin 0.3 - 1.2 mg/dL 5.0(H) - 5.7(H)  Alkaline Phos 38 - 126 U/L 375(H) - 387(H)  AST 15 - 41 U/L 164(H) - 172(H)  ALT 0 - 44 U/L 138(H) - 127(H)    Lab Results  Component Value Date   WBC 22.1 (H) 04/26/2021   HGB 9.6 (L) 04/26/2021   HCT 28.9 (L) 04/26/2021   MCV 88.7 04/26/2021   PLT 149 (L) 04/26/2021   NEUTROABS 18.1 (H) 04/26/2021    ASSESSMENT AND PLAN: 1.  Metastatic breast cancer with extensive liver metastases occupying much of the entirety of the liver status post 1 cycle of treatment with Abraxane and pembrolizumab.  Remarkable improvement in the bilirubin levels.  Bilirubin has come down to 5. 2. neutropenic fever: Resolved no further issues with neutropenia. 3.  Severe electrolyte imbalances: Being addressed 4.  Failure to thrive: Improvement in her energy levels and physical strength.  Once she recovers from the hospitalization we will consider retreating her with another dose of chemotherapy and immunotherapy.

## 2021-04-26 NOTE — Progress Notes (Signed)
°   04/25/21 2127  Assess: MEWS Score  Temp 98 F (36.7 C)  BP 94/64  Pulse Rate (!) 133  Resp 18  SpO2 96 %  O2 Device Nasal Cannula  Assess: MEWS Score  MEWS Temp 0  MEWS Systolic 1  MEWS Pulse 3  MEWS RR 0  MEWS LOC 0  MEWS Score 4  MEWS Score Color Red  Assess: if the MEWS score is Yellow or Red  Were vital signs taken at a resting state? Yes  Focused Assessment No change from prior assessment  Does the patient meet 2 or more of the SIRS criteria? No  MEWS guidelines implemented *See Row Information* No, previously red, continue vital signs every 4 hours  Treat  MEWS Interventions Administered scheduled meds/treatments  Take Vital Signs  Increase Vital Sign Frequency  Red: Q 1hr X 4 then Q 4hr X 4, if remains red, continue Q 4hrs  Escalate  MEWS: Escalate Red: discuss with charge nurse/RN and provider, consider discussing with RRT  Notify: Charge Nurse/RN  Name of Charge Nurse/RN Notified Elmyra Ricks, RN (2nd nurse)  Date Charge Nurse/RN Notified 04/25/21  Time Charge Nurse/RN Notified 2130  Notify: Provider  Provider Name/Title [place] (MD was notified last night); Stark Klein has been following patient regarding potassium tonight.  Notify: Rapid Response  Name of Rapid Response RN Notified Erin, RN  Date Rapid Response Notified 04/25/21  Time Rapid Response Notified 0059  Document  Patient Outcome Not stable and remains on department  Progress note created (see row info) Yes  Assess: SIRS CRITERIA  SIRS Temperature  0  SIRS Pulse 1  SIRS Respirations  0  SIRS WBC 0  SIRS Score Sum  1

## 2021-04-26 NOTE — Progress Notes (Signed)
Dr. Louanne Belton notified Patient's heart rate is in the 120's after starting Lopressor 12.5 mg. Paige Perry refused any Magic Mouthwash today.

## 2021-04-27 ENCOUNTER — Inpatient Hospital Stay (HOSPITAL_COMMUNITY): Payer: BC Managed Care – PPO

## 2021-04-27 ENCOUNTER — Other Ambulatory Visit: Payer: Self-pay

## 2021-04-27 DIAGNOSIS — Z7189 Other specified counseling: Secondary | ICD-10-CM | POA: Diagnosis not present

## 2021-04-27 DIAGNOSIS — C50412 Malignant neoplasm of upper-outer quadrant of left female breast: Secondary | ICD-10-CM | POA: Diagnosis not present

## 2021-04-27 DIAGNOSIS — D709 Neutropenia, unspecified: Secondary | ICD-10-CM | POA: Diagnosis not present

## 2021-04-27 DIAGNOSIS — C787 Secondary malignant neoplasm of liver and intrahepatic bile duct: Secondary | ICD-10-CM | POA: Diagnosis not present

## 2021-04-27 DIAGNOSIS — R509 Fever, unspecified: Secondary | ICD-10-CM

## 2021-04-27 DIAGNOSIS — R0600 Dyspnea, unspecified: Secondary | ICD-10-CM

## 2021-04-27 DIAGNOSIS — Z515 Encounter for palliative care: Secondary | ICD-10-CM

## 2021-04-27 LAB — PHOSPHORUS: Phosphorus: 1.3 mg/dL — ABNORMAL LOW (ref 2.5–4.6)

## 2021-04-27 LAB — COMPREHENSIVE METABOLIC PANEL
ALT: 148 U/L — ABNORMAL HIGH (ref 0–44)
AST: 169 U/L — ABNORMAL HIGH (ref 15–41)
Albumin: <1.5 g/dL — ABNORMAL LOW (ref 3.5–5.0)
Alkaline Phosphatase: 379 U/L — ABNORMAL HIGH (ref 38–126)
Anion gap: 4 — ABNORMAL LOW (ref 5–15)
BUN: 5 mg/dL — ABNORMAL LOW (ref 6–20)
CO2: 26 mmol/L (ref 22–32)
Calcium: 7.3 mg/dL — ABNORMAL LOW (ref 8.9–10.3)
Chloride: 101 mmol/L (ref 98–111)
Creatinine, Ser: 0.37 mg/dL — ABNORMAL LOW (ref 0.44–1.00)
GFR, Estimated: >60 mL/min (ref >60)
Glucose, Bld: 109 mg/dL — ABNORMAL HIGH (ref 70–99)
Potassium: 3.3 mmol/L — ABNORMAL LOW (ref 3.5–5.1)
Sodium: 131 mmol/L — ABNORMAL LOW (ref 135–145)
Total Bilirubin: 4.3 mg/dL — ABNORMAL HIGH (ref 0.3–1.2)
Total Protein: 5.1 g/dL — ABNORMAL LOW (ref 6.5–8.1)

## 2021-04-27 LAB — CULTURE, BLOOD (ROUTINE X 2)
Culture: NO GROWTH
Culture: NO GROWTH
Special Requests: ADEQUATE
Special Requests: ADEQUATE

## 2021-04-27 LAB — CBC WITH DIFFERENTIAL/PLATELET
Abs Immature Granulocytes: 3.03 10*3/uL — ABNORMAL HIGH (ref 0.00–0.07)
Basophils Absolute: 0 10*3/uL (ref 0.0–0.1)
Basophils Relative: 0 %
Eosinophils Absolute: 0 10*3/uL (ref 0.0–0.5)
Eosinophils Relative: 0 %
HCT: 28.6 % — ABNORMAL LOW (ref 36.0–46.0)
Hemoglobin: 9.4 g/dL — ABNORMAL LOW (ref 12.0–15.0)
Immature Granulocytes: 12 %
Lymphocytes Relative: 7 %
Lymphs Abs: 1.7 10*3/uL (ref 0.7–4.0)
MCH: 29.6 pg (ref 26.0–34.0)
MCHC: 32.9 g/dL (ref 30.0–36.0)
MCV: 89.9 fL (ref 80.0–100.0)
Monocytes Absolute: 1.4 10*3/uL — ABNORMAL HIGH (ref 0.1–1.0)
Monocytes Relative: 6 %
Neutro Abs: 18.9 10*3/uL — ABNORMAL HIGH (ref 1.7–7.7)
Neutrophils Relative %: 75 %
Platelets: 144 10*3/uL — ABNORMAL LOW (ref 150–400)
RBC: 3.18 MIL/uL — ABNORMAL LOW (ref 3.87–5.11)
RDW: 16.3 % — ABNORMAL HIGH (ref 11.5–15.5)
WBC: 25 10*3/uL — ABNORMAL HIGH (ref 4.0–10.5)
nRBC: 2.1 % — ABNORMAL HIGH (ref 0.0–0.2)

## 2021-04-27 LAB — MAGNESIUM: Magnesium: 1.7 mg/dL (ref 1.7–2.4)

## 2021-04-27 IMAGING — DX DG CHEST 1V PORT
1 series · 1 of 1 positions shown · non-contrast
Comparison: [DATE]

CLINICAL DATA: Dyspnea

EXAM:
PORTABLE CHEST 1 VIEW

[chest ap]
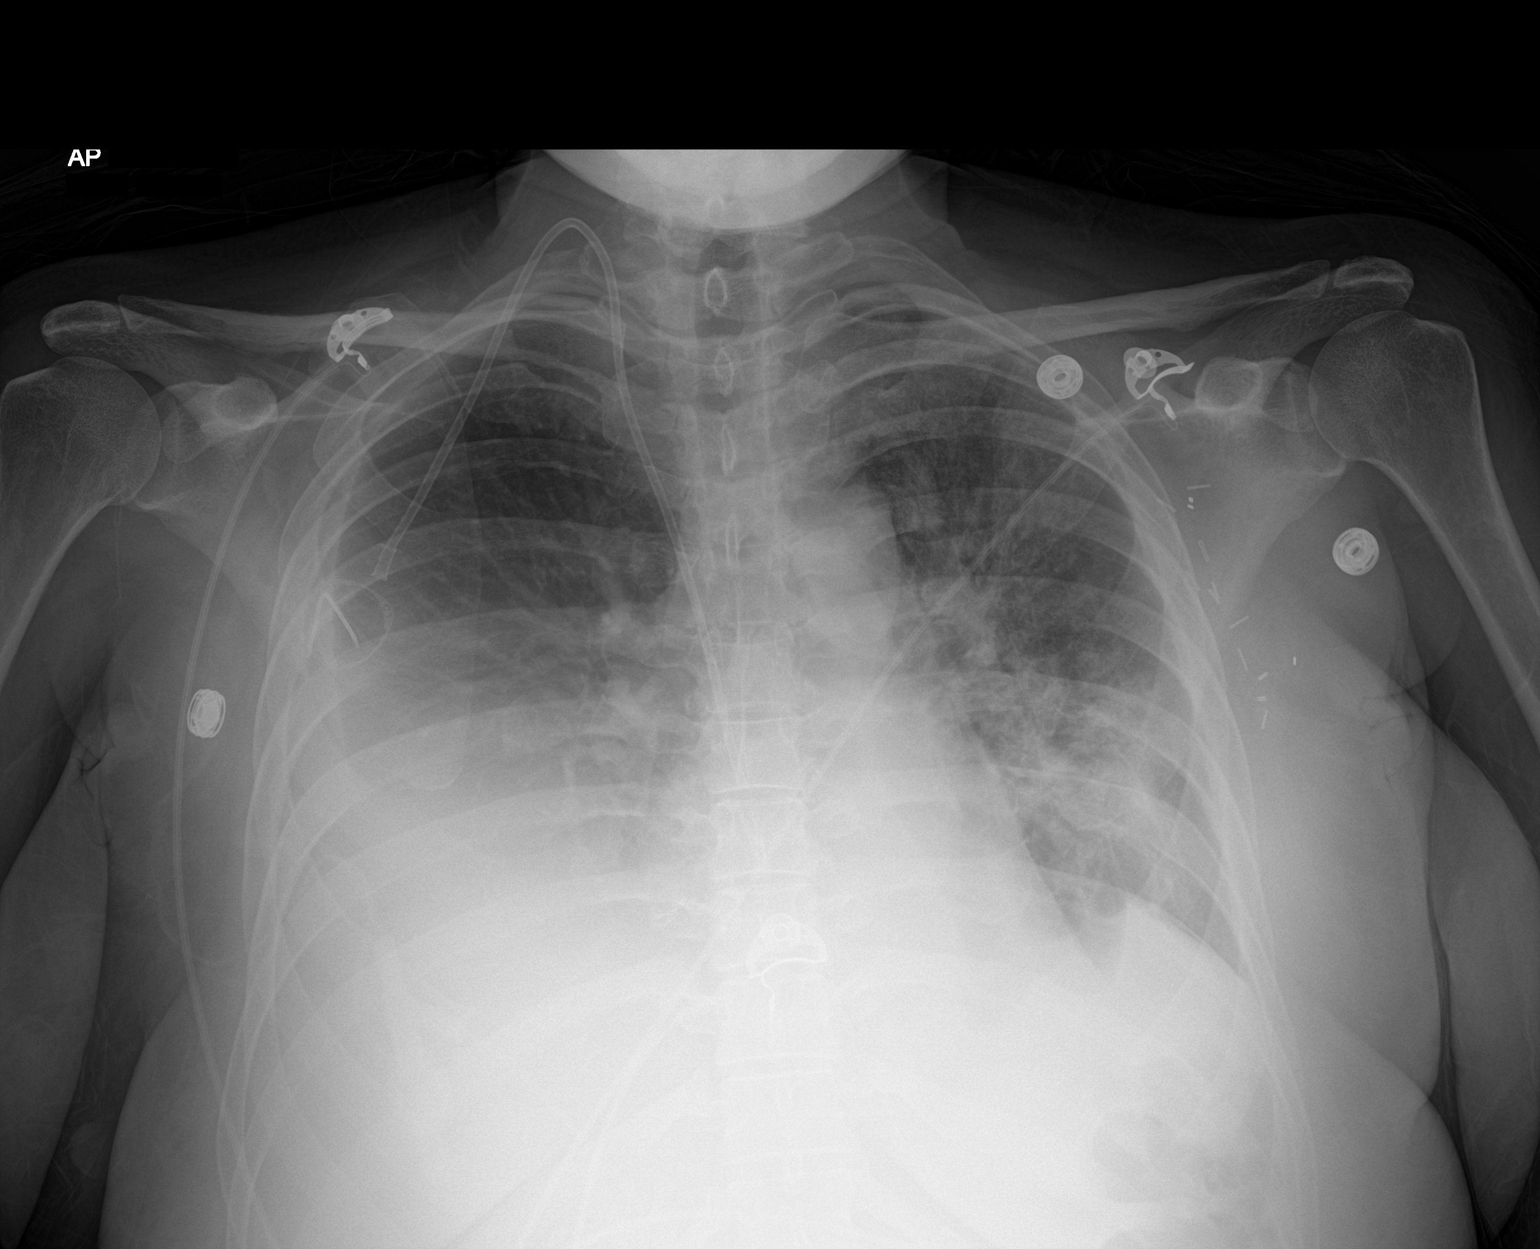

[1 of 1 positions shown; findings below may reference images not displayed]

FINDINGS: Bilateral interstitial thickening. Patchy left perihilar airspace
disease. Moderate right and small left pleural effusion. No
pneumothorax. Stable cardiomediastinal silhouette. Right-sided
Port-A-Cath in satisfactory position. No acute osseous abnormality.
IMPRESSION: Moderate right and small left pleural effusion. Patchy left
perihilar airspace disease concerning for pneumonia.

## 2021-04-27 IMAGING — CT CT ANGIO CHEST
2 of 7 series · 18 of 46 positions shown · IV contrast (OMNIPAQUE)
Comparison: Chest CT dated [DATE]

CLINICAL DATA: Shortness of breath

EXAM:
CT ANGIOGRAPHY CHEST WITH CONTRAST
TECHNIQUE: Multidetector CT imaging of the chest was performed using the
standard protocol during bolus administration of intravenous
contrast. Multiplanar CT image reconstructions and MIPs were
obtained to evaluate the vascular anatomy.

[Series 5: thins · axial · 0.64mm/px · z∈[+1130,+1374]mm · 16 of 278 slices shown]
[im 17/278  lung]
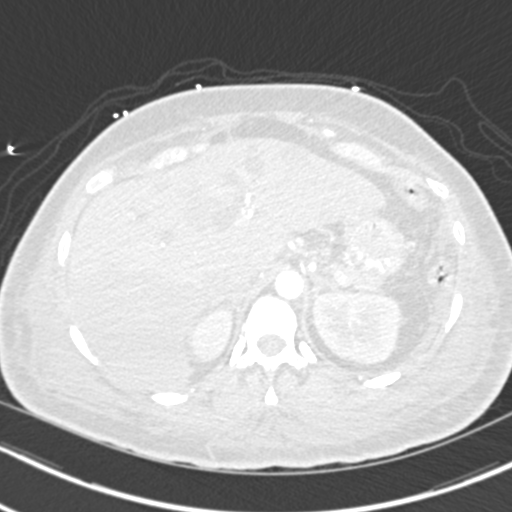
[im 33/278  soft-tissue]
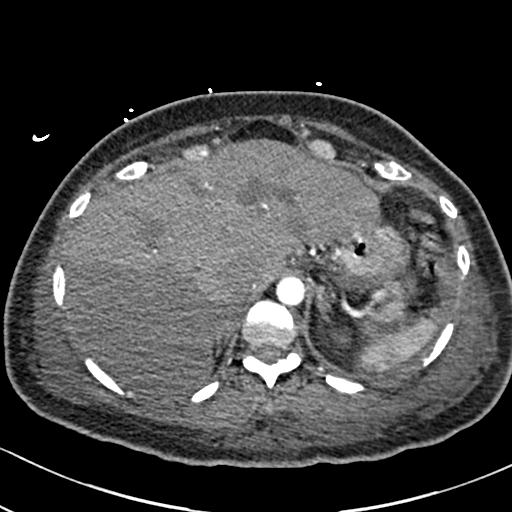
[im 49/278  lung]
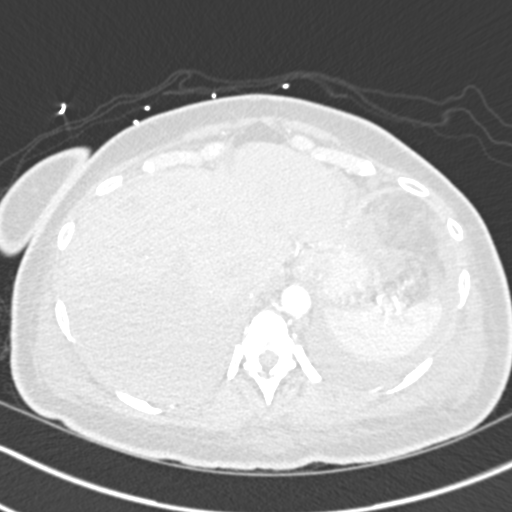
[im 66/278  soft-tissue]
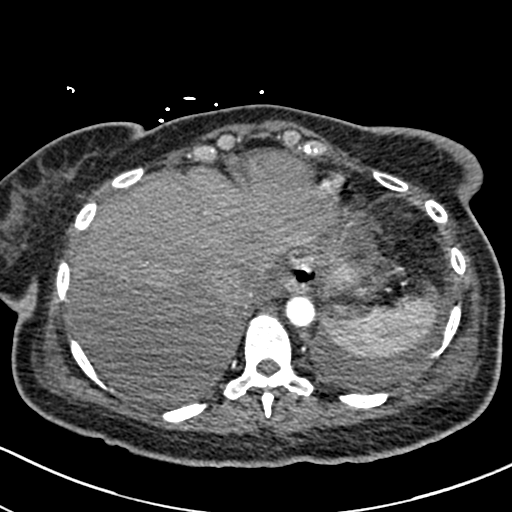
[im 82/278  lung]
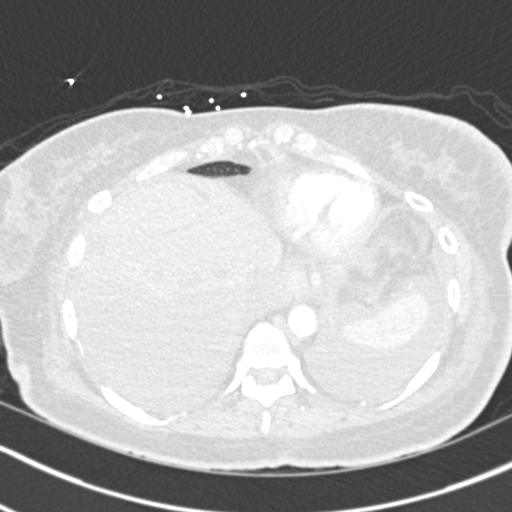
[im 98/278  soft-tissue]
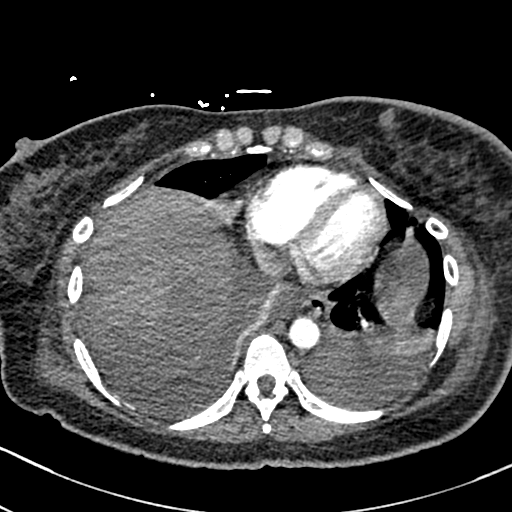
[im 115/278  lung]
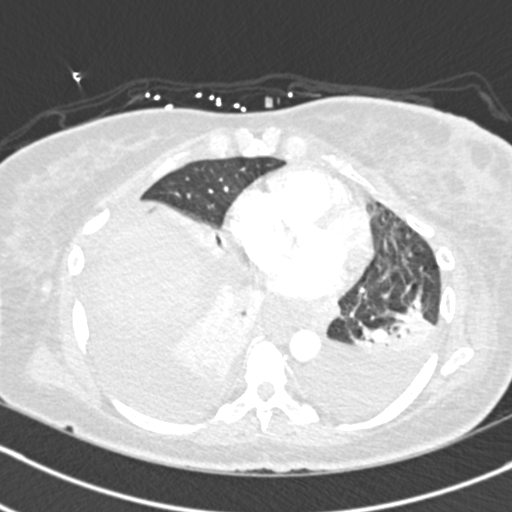
[im 131/278  soft-tissue]
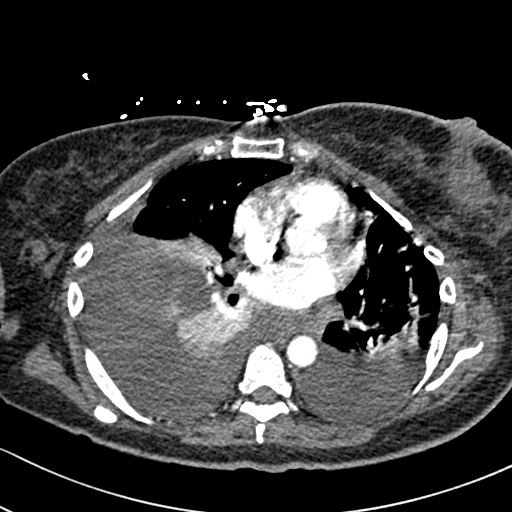
[im 147/278  lung]
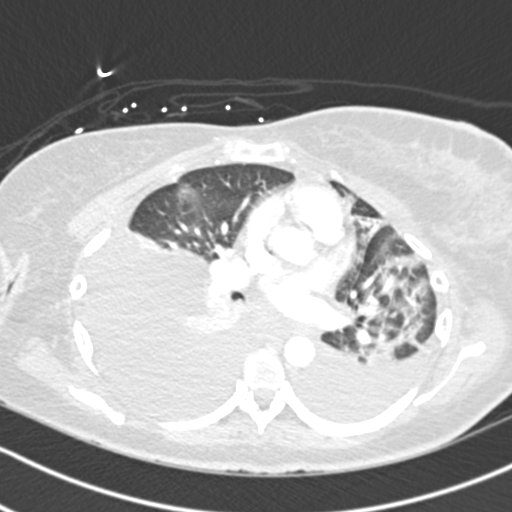
[im 163/278  soft-tissue]
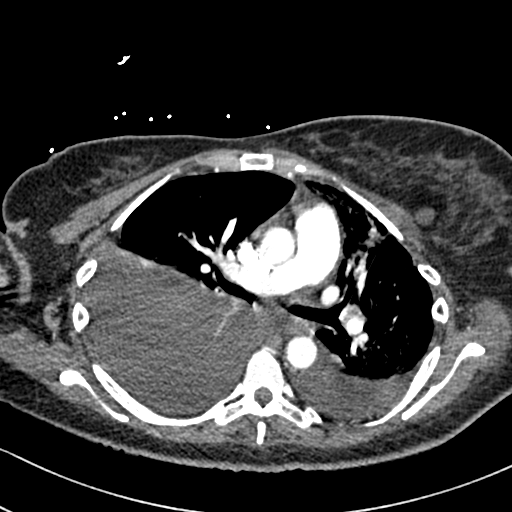
[im 180/278  lung]
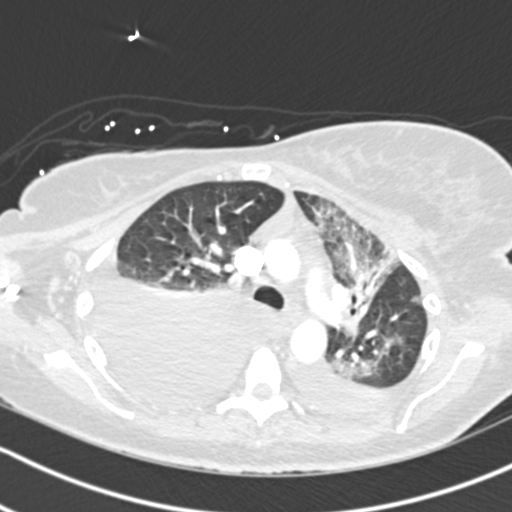
[im 196/278  soft-tissue]
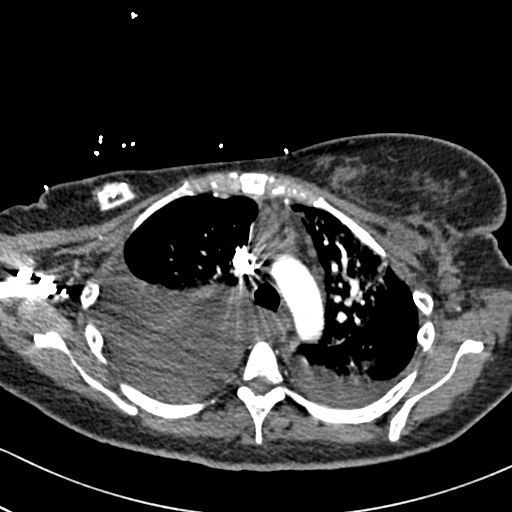
[im 212/278  lung]
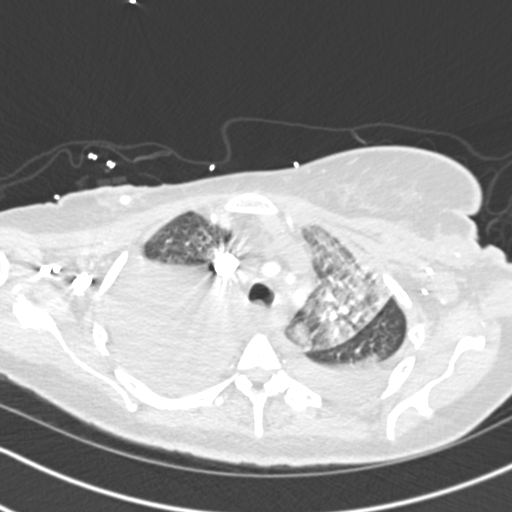
[im 229/278  soft-tissue]
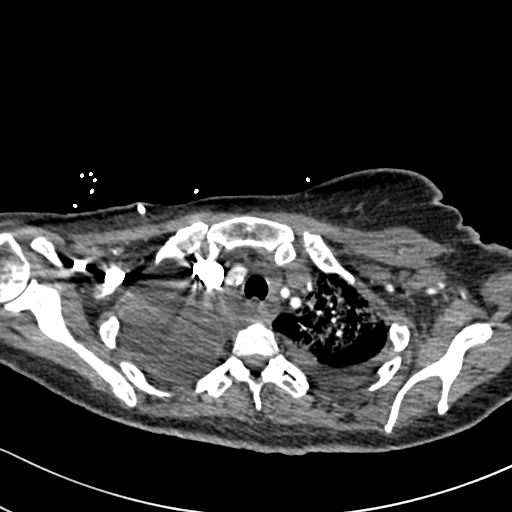
[im 245/278  lung]
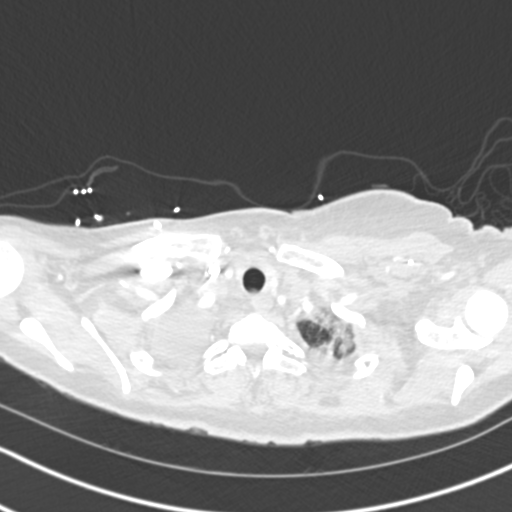
[im 261/278  soft-tissue]
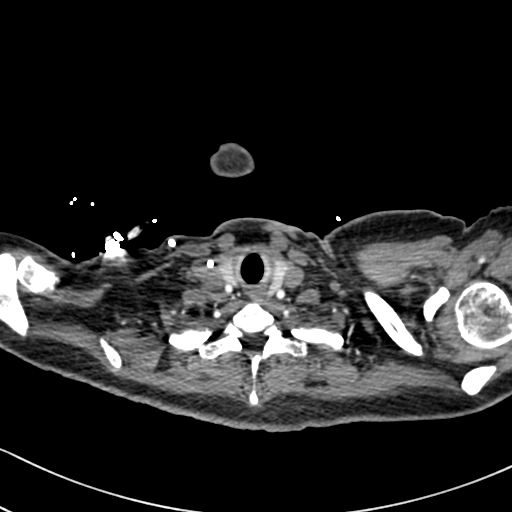

[Series 7: coronal mpr · coronal · 0.64mm/px · 2 of 68 slices shown]
[im 23/68  soft-tissue]
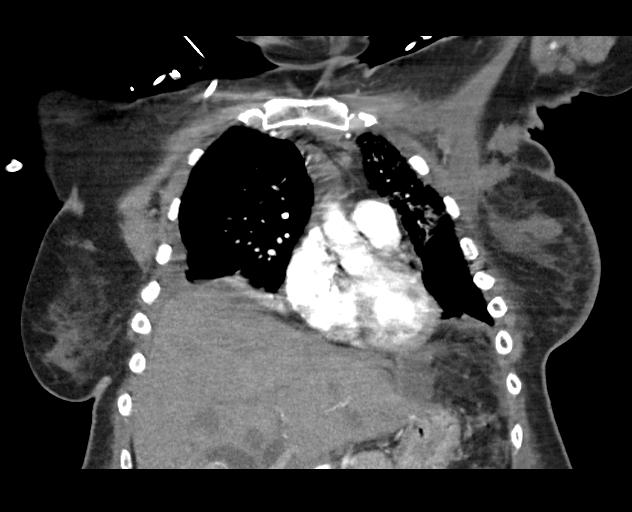
[im 45/68  soft-tissue]
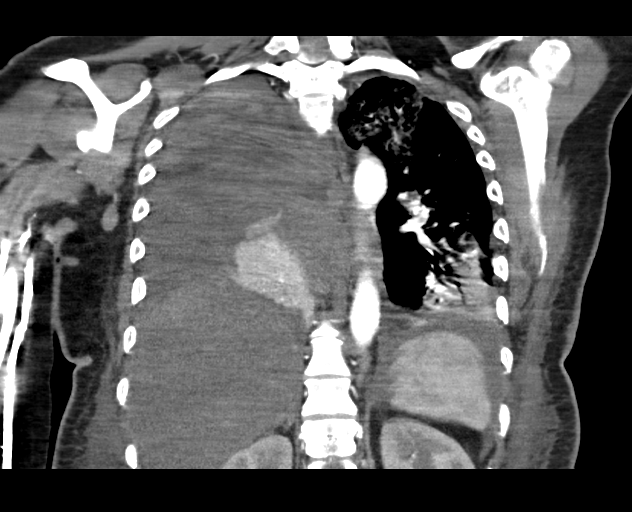

[18 of 46 positions shown; findings below may reference images not displayed]

RADIATION DOSE REDUCTION: This exam was performed according to the
departmental dose-optimization program which includes automated
exposure control, adjustment of the mA and/or kV according to
patient size and/or use of iterative reconstruction technique.

CONTRAST:  61mL OMNIPAQUE IOHEXOL 350 MG/ML SOLN
FINDINGS: Cardiovascular: Adequate contrast opacification of the pulmonary
arteries. Limited evaluation of the segmental and subsegmental
pulmonary arteries due to motion artifact and streak artifact.
Normal heart size. No pericardial effusion. Normal caliber thoracic
aorta.

Mediastinum/Nodes: Enlarged left axillary, mediastinal, hilar, and
internal mammary lymph nodes are decreased in size when compared
with prior exam. Reference left axillary lymph node measuring 2.0 cm
in short axis on series 5, image 67, previously measured 2.3 cm in
short axis. Reference AP window lymph node measures 0.7 cm on image
94, previously measured 1.5 cm. Reference left internal mammary
lymph node measures 0.8 cm in short axis, previously 1.1 cm.

Lungs/Pleura: Increased large right pleural effusion with complete
collapse of the right lower lobe and right middle lobe. New small
left pleural effusion. New ground-glass opacities of the left upper
lobe and left lower lobe.

Upper Abdomen: Similar widespread metastatic disease seen of the
liver with dominant right hepatic lobe mass and unchanged biliary
ductal dilation.

Musculoskeletal: No aggressive appearing osseous lesions.

Review of the MIP images confirms the above findings.
IMPRESSION: 1. No evidence of central pulmonary embolus, evaluation of the more
distal pulmonary arteries is limited due to artifact.
2. Increased large right pleural effusion with complete collapse of
the right lower lobe and right middle lobe. New small left pleural
effusion.
3. New ground-glass opacities of the left upper lobe and left lower
lobe, concerning for infection or inflammatory process such as drug
reaction.
4. Enlarged left axillary, mediastinal, hilar, and internal mammary
lymph nodes are decreased in size when compared with prior exam.
5. Similar widespread hepatic metastatic disease and biliary ductal
dilation.

## 2021-04-27 MED ORDER — POTASSIUM PHOSPHATES 15 MMOLE/5ML IV SOLN
30.0000 mmol | Freq: Once | INTRAVENOUS | Status: AC
  Administered 2021-04-27: 30 mmol via INTRAVENOUS
  Filled 2021-04-27: qty 10

## 2021-04-27 MED ORDER — LIDOCAINE HCL 1 % IJ SOLN
INTRAMUSCULAR | Status: AC
  Filled 2021-04-27: qty 20

## 2021-04-27 MED ORDER — FUROSEMIDE 10 MG/ML IJ SOLN
20.0000 mg | Freq: Once | INTRAMUSCULAR | Status: AC
  Administered 2021-04-27: 20 mg via INTRAVENOUS
  Filled 2021-04-27: qty 2

## 2021-04-27 MED ORDER — SODIUM CHLORIDE (PF) 0.9 % IJ SOLN
INTRAMUSCULAR | Status: AC
  Filled 2021-04-27: qty 50

## 2021-04-27 MED ORDER — IOHEXOL 350 MG/ML SOLN
100.0000 mL | Freq: Once | INTRAVENOUS | Status: AC | PRN
  Administered 2021-04-27: 61 mL via INTRAVENOUS

## 2021-04-27 MED ORDER — HYDROCORTISONE 1 % EX CREA
TOPICAL_CREAM | Freq: Two times a day (BID) | CUTANEOUS | Status: DC | PRN
  Filled 2021-04-27: qty 28

## 2021-04-27 NOTE — Consult Note (Signed)
Palliative Care Consult Note                                  Date: 04/27/2021   Patient Name: Paige Perry  DOB: [birth year]  MRN: [id number]  Age / Sex: 46 y.o., female  PCP: Paige Beals, NP Referring Physician: Flora Lipps, MD  Reason for Consultation: Establishing goals of care  HPI/Patient Profile: Palliative Care consult requested for goals of care discussion in this 46 y.o. female  with medical history of metastatic breast cancer with extensive liver involvement s/p chemotherapy treatment. She was admitted on 04/22/2021 with neutropenic fever. Albumin <1.5, bilirubin 13.5 now down to 5.1.   Past Medical History:  Diagnosis Date   Asthma    Cancer (Paige Perry)    breast   Family history of breast cancer    Family history of breast cancer    Family history of prostate cancer    Family history of throat cancer    Hypercholesteremia    Hypokalemia 04/21/2021   PONV (postoperative nausea and vomiting)      Subjective:   This NP Paige Perry reviewed medical records, received report from team, assessed the patient and then met at the patient's bedside with Paige Perry to discuss diagnosis, prognosis, GOC, EOL wishes disposition and options.  Paige Perry is well known to myself and palliative. I am actively following her at Paige Perry for ongoing symptom management support and goals of care. Our most recent visit was on 04/12/2021.   We discussed Her current illness and what it means in the larger context of Her on-going co-morbidities. Natural disease trajectory and expectations were discussed. Paige Perry expresses her frustration regarding her overall condition, however also speaks to her appreciation of being hospitalized and feeling much better. She shares she has been able to work with physical therapy and her appetite is improved.   She shares her appetite is improving and she has eaten more than she has in  several days most recently.   We discussed goals of care. Paige Perry is clear in her expressed understanding of her metastatic cancer however is remaining hopeful. She is not open to discussing withdrawal of care and is remaining focus on showing some improvement and allowing her every opportunity with whatever treatment options that are available.   She acknowledges she is tired and weak however wishes to continue pressing on at this time. I empathetically express understanding with high concerns of her wellbeing and poor prognosis.   I discussed the importance of continued conversation with family and their medical providers regarding overall plan of care and treatment options, ensuring decisions are within the context of the patients values and GOCs.  Questions and concerns were addressed.Patient was encouraged to call with questions or concerns. She is appreciative of our visit.  PMT will continue to support holistically as needed.  Objective:   Primary Diagnoses: Present on Admission:  Liver metastases (Paige Perry)   Scheduled Meds:  Chlorhexidine Gluconate Cloth  6 each Topical Daily   enoxaparin (LOVENOX) injection  40 mg Subcutaneous Q24H   feeding supplement  237 mL Oral TID BM   fentaNYL  1 patch Transdermal Q72H   fluconazole  100 mg Oral Daily   lidocaine       metoprolol tartrate  12.5 mg Oral BID   multivitamin with minerals  1 tablet Oral Daily   oxymetazoline  1 spray Each Nare BID  pantoprazole  40 mg Oral BID   sodium chloride (PF)       sodium chloride flush  3 mL Intravenous Q12H    Continuous Infusions:  sodium chloride      PRN Meds: sodium chloride, acetaminophen **OR** acetaminophen, albuterol, alum & mag hydroxide-simeth, diphenhydrAMINE, magic mouthwash, ondansetron **OR** ondansetron (ZOFRAN) IV, oxyCODONE, phenol, prochlorperazine, sodium chloride flush  Allergies  Allergen Reactions   Other     Corn: Unknown   Adhesive [Tape] Rash    Cefepime Rash   Latex Rash   Tapentadol Rash    Review of Systems  Constitutional:  Positive for fatigue.  Unless otherwise noted, a complete review of systems is negative.  Physical Exam General: NAD, frail, ill appearing Cardiovascular: tachycardic Pulmonary:  diminished bilaterally  Abdomen: soft, nontender, + bowel sounds Extremities: no edema, no joint deformities Skin: dry and itchy. Jaundice, scleral icterus  Neurological: AAO x4  Vital Signs:  BP (!) 84/56    Pulse (!) 142    Temp 97.8 F (36.6 C) (Oral)    Resp 20    Ht 5' 3"  (1.6 m) Comment: per chart 6 days ago.   Wt 88.5 kg    SpO2 92%    BMI 34.56 kg/m  Pain Scale: 0-10 POSS *See Group Information*: 1-Acceptable,Awake and alert Pain Score: 0-No pain  SpO2: SpO2: 92 % O2 Device:SpO2: 92 % O2 Flow Rate: .O2 Flow Rate (L/min): 2 L/min  IO: Intake/output summary:  Intake/Output Summary (Last 24 hours) at 04/27/2021 1608 Last data filed at 04/27/2021 1030 Gross per 24 hour  Intake 170.12 ml  Output 1550 ml  Net -1379.88 ml    LBM: Last BM Date: 04/26/21 Baseline Weight: Weight: 76.1 kg Most recent weight: Weight: 88.5 kg      Palliative Assessment/Data: PPS 30-40%   Advanced Care Planning:   Primary Decision Maker: PATIENT  Code Status/Advance Care Planning: Full code  Patient confirms wishes for all aggressive interventions and full code status. I will continue to closely follow with ongoing discussions. Recommendations for DNR/DNI provided.   Assessment & Plan:   SUMMARY OF RECOMMENDATIONS   Full Code-as confirmed by patient Continue with current plan of care . Patient is remaining hopeful for some stability. Is clear in expressed wishes to continue to treat aggressively allowing her every opportunity for what time she has left.  Reports pain is well controlled.  Encouraged ongoing family discussions regarding goals of care, prognosis, and wishes.  PMT will continue to support and follow as  needed. Please call or secure chat with urgent needs.  Symptom Management:  Cancer related pain Fentanyl patch 25 mcg  Oxy IR 10 mg as needed for breakthrough pain (has not needed) Pruritis Benadryl as needed for itching Hydrocortisone cream 1% apply to skin as needed for itching.     Palliative Prophylaxis:  Bowel Regimen and Frequent Pain Assessment  Additional Recommendations (Limitations, Scope, Preferences): Full Scope Treatment  Psycho-social/Spiritual:  Desire for further Chaplaincy support: no Additional Recommendations:  ongoing goals of care discussions  Prognosis:  Poor-in the setting of widespread metastatic breast cancer with extensive liver involvement, bilirubin 5.3, albumin <1.5, elevated liver function, deconditioned, generalized weakness  Discharge Planning:  Home with Home Health and ongoing outpatient palliative support.   Patient expressed understanding and was in agreement with this plan.   Time Total: 50 min.   Visit consisted of counseling and education dealing with the complex and emotionally intense issues of symptom management and palliative care in the  setting of serious and potentially life-threatening illness.Greater than 50%  of this time was spent counseling and coordinating care related to the above assessment and plan.  Signed by:  Alda Lea, AGPCNP-BC Palliative Medicine Team  Phone: [phone] Pager: [phone] Amion: Bjorn Pippin   Thank you for allowing the Palliative Medicine Team to assist in the care of this patient. Please utilize secure chat with additional questions, if there is no response within 30 minutes please call the above phone number. Palliative Medicine Team providers are available by phone from 7am to 5pm daily and can be reached through the team cell phone.  Should this patient require assistance outside of these hours, please call the patient's attending physician.

## 2021-04-27 NOTE — Progress Notes (Signed)
[facility] Liaison note: ? ?This is a pending outpatient-based Palliative Care patient. Will continue to follow for disposition. ? ?Please call with any outpatient palliative questions or concerns. ? ?Thank you, ?Dee Curry, LPN ?[facility] Liaison ?[phone] ?

## 2021-04-27 NOTE — Progress Notes (Signed)
°   04/27/21 0750  Assess: MEWS Score  Temp 98.6 F (37 C)  BP 94/68  Pulse Rate (!) 132  Resp 20  SpO2 90 %  O2 Device Nasal Cannula  O2 Flow Rate (L/min) 2 L/min  Assess: MEWS Score  MEWS Temp 0  MEWS Systolic 1  MEWS Pulse 3  MEWS RR 0  MEWS LOC 0  MEWS Score 4  MEWS Score Color Red  Assess: if the MEWS score is Yellow or Red  Were vital signs taken at a resting state? Yes  Focused Assessment No change from prior assessment  Does the patient meet 2 or more of the SIRS criteria? Yes  MEWS guidelines implemented *See Row Information* Yes  Treat  MEWS Interventions Escalated (See documentation below)  Pain Scale 0-10  Pain Score 0  Take Vital Signs  Increase Vital Sign Frequency  Red: Q 1hr X 4 then Q 4hr X 4, if remains red, continue Q 4hrs  Escalate  MEWS: Escalate Red: discuss with charge nurse/RN and provider, consider discussing with RRT  Notify: Charge Nurse/RN  Name of Charge Nurse/RN Notified [place], RN  Date Charge Nurse/RN Notified 04/27/21  Time Charge Nurse/RN Notified 0900  Notify: Provider  Provider Name/Title Flora Lipps, MD  Date Provider Notified 04/27/21  Time Provider Notified [phone]  Notification Type Face-to-face  Notification Reason Other (Comment) (Red MEWS)  Provider response See new orders  Date of Provider Response 04/27/21  Time of Provider Response 0815  Assess: SIRS CRITERIA  SIRS Temperature  0  SIRS Pulse 1  SIRS Respirations  0  SIRS WBC 1  SIRS Score Sum  2   Provider ordered lasix and chest x-ray

## 2021-04-27 NOTE — Progress Notes (Signed)
PROGRESS NOTE  Paige Perry WPV:[id number] DOB: 01/25/1976 DOA: 04/22/2021 PCP: [name], NP   LOS: 5 days   Brief narrative:  Paige Perry is a 46 y.o. female with past medical history of breast cancer with metastasis was sent from oncology office with complaints of sore throat, fatigue weakness and fever.  In the ED, patient was noted to have a fever with mild tachycardia.  Labs done in the outpatient clinic on 04/22/2021 showed a WBC count of 0.7 with hemoglobin of 7.6 and platelet of 196.  Magnesium of 1.9.  Normal level was low at 126, potassium of 2.9 and chloride of 90.  Creatinine at 0.4.  AST and ALT were elevated at 150 and 146 respectively.  Alkaline phos was elevated at 913 and total bilirubin 13.5.  Patient was then admitted to hospital for neutropenic fever.  During hospitalization, patient has improved in terms ulcers and fever but continues to be persistently tachycardic and hypotensive with increasing dyspnea.  Spoke with Dr. Lindi Adie oncology who recommended CT scan of the chest to rule out PE.  Assessment/Plan:  Principal Problem:   Neutropenic fever ([place]) Active Problems:   Malignant neoplasm of upper-outer quadrant of left breast in female, estrogen receptor positive (HCC)   Liver metastases (HCC)   Pressure injury of skin  Severe neutropenia with neutropenic fever.  On presentation.  Has improved at this time.  Received Granix.  Now with leukocytosis at 25,000.  Antibiotics have been discontinued since her cultures were negative and had allergic reaction to cefepime.   X-ray of the chest shows moderate right pleural effusion similar in appearance to previous exam patient appears to be more dyspneic.  Has received significant amount of fluids.  We will give 1 dose of IV Lasix today.  Will order ultrasound-guided thoracocentesis on the right side both therapeutic and diagnostic..  We will check labs to rule out any superimposed infection as well.  Borderline  hypotension, persistently tachycardic despite no pain, no fever.  I spoke with [facility] oncology.  He recommends ruling out pulmonary embolism but chemotherapy agent that she is on could do some tachycardia and metoprolol would be recommended.  I have initiated metoprolol since yesterday but due to hypotension will hold this morning.  Hold off with IV fluids for now.  We will get 2D echocardiogram to rule out LV dysfunction.  We will get CTA chest chest tube PE protocol to rule out pulmonary embolism.  Diffuse erythematous rash over the body likely cefepime induced.  Improved with Benadryl.  No further itching.  Cefepime added as an allergy for rash.   Oral ulcerations, sore throat.  Continue Magic mouthwash and  Diflucan.  Negative MRSA screen.Marland Kitchen  Has been able to eat better.  Anemia of chronic disease malignancy.  Hemoglobin of 9.4 today.  Received 2 units of packed RBC on 04/23/2021  History of left breast cancer with liver metastasis.  Being followed by oncology as outpatient.  Receiving chemotherapy as outpatient.  Spoke with Dr. Lindi Adie oncology today.  Wishes to continue with life-prolonging measures as long as possible.  We will get a palliative care consultation to discuss about goals of care.   Severe hypokalemia.    Potassium of 3.3 today.  We will continue to replenish with K-Phos.  Check levels in a.m.  Hypophosphatemia.  Phosphorus improved to 1.3 today. Unable to take oral phosphorus and potassium due to oral ulceration and does not prefer to take orally.  Hyponatremia.  Improved with IV hydration.  Latest sodium 131  Hypoalbuminemia, elevated LFTs with elevated bilirubin..  Secondary to liver metastasis   Severe fatigue, weakness.   multifactorial from electrolyte imbalance, volume depletion, neutropenic fever.  Overall feels stronger.    Ambulate in the hall as able.  Physical therapy recommended home PT on discharge.   Stage II pressure ulceration.  Present admission.  Continue wound  care   DVT prophylaxis: enoxaparin (LOVENOX) injection 40 mg Start: 04/22/21 2200 SCDs Start: 04/22/21 1716   Code Status: Full code  Family Communication:   I  spoke with the patient's mother and updated her about the clinical condition of the patient on 04/27/2021.  Status is: Inpatient  Remains inpatient appropriate because: Persistent tachycardia needing further work-up,, electrolyte imbalances, debility/deconditioning   Consultants: Oncology  Procedures: None  Anti-infectives:  Diflucan >  cefepime 1/26>1/29  Anti-infectives (From admission, onward)    Start     Dose/Rate Route Frequency Ordered Stop   04/24/21 1400  fluconazole (DIFLUCAN) tablet 100 mg        100 mg Oral Daily 04/24/21 1057     04/22/21 2000  fluconazole (DIFLUCAN) IVPB 100 mg  Status:  Discontinued        100 mg 50 mL/hr over 60 Minutes Intravenous Every 24 hours 04/22/21 1724 04/24/21 1057   04/22/21 1800  ceFEPIme (MAXIPIME) 2 g in sodium chloride 0.9 % 100 mL IVPB  Status:  Discontinued        2 g 200 mL/hr over 30 Minutes Intravenous Every 8 hours 04/22/21 1716 04/25/21 0741       Subjective: Today, she was seen and examined at bedside.  Patient feels a little stronger with improved ability to eat but has a dyspnea.  Nursing staff reports of persistent tachycardia.  Blood pressure on the lower side.  Objective: Vitals:   04/27/21 0938 04/27/21 1038  BP: 94/68 (!) 85/64  Pulse: (!) 133 (!) 137  Resp: 18 (!) 22  Temp: 98.5 F (36.9 C) (!) 97.5 F (36.4 C)  SpO2: 94% 94%    Intake/Output Summary (Last 24 hours) at 04/27/2021 1051 Last data filed at 04/27/2021 0545 Gross per 24 hour  Intake 3 ml  Output 550 ml  Net -547 ml    Filed Weights   04/23/21 0410 04/24/21 0630 04/27/21 0403  Weight: 76.1 kg 81.9 kg 88.5 kg   Body mass index is 34.56 kg/m.   Physical Exam:  General: Obese built, not in obvious distress, mild pallor noted, deconditioned and appears weak HENT:   No  scleral pallor or icterus noted. Oral mucosa is moist with some angular ulceration and whitish patches. Chest:   Diminished breath sounds bilaterally mostly on the right side. CVS: S1 &S2 heard. No murmur.  Regular rate and rhythm.  Tachycardia noted. Abdomen: Soft, nontender, nondistended.  Bowel sounds are heard.   Extremities: No cyanosis, clubbing or edema.  Peripheral pulses are palpable. Psych: Alert, awake and oriented, normal mood CNS:  No cranial nerve deficits.  Generalized weakness noted. Skin: Warm and dry.  Decubitus ulceration present on admission.  Data Review: I have personally reviewed the following laboratory data and studies,  CBC: Recent Labs  Lab 04/23/21 0500 04/23/21 1957 04/24/21 0613 04/25/21 0500 04/26/21 0533 04/27/21 0434  WBC 0.8*  --  3.5* 12.4* 22.1* 25.0*  NEUTROABS 0.2*  --  2.2 9.4* 18.1* 18.9*  HGB 6.5* 10.8* 10.3* 9.7* 9.6* 9.4*  HCT 20.1* 31.7* 30.6* 29.3* 28.9* 28.6*  MCV 89.3  --  88.4  89.1 88.7 89.9  PLT 193  --  181 162 149* 144*    Basic Metabolic Panel: Recent Labs  Lab 04/22/21 1144 04/23/21 0500 04/24/21 0613 04/25/21 0500 04/25/21 2114 04/26/21 0533 04/27/21 0434  NA 126*   < > 132* 130* 130* 130* 131*  K 2.9*   < > 2.5* 3.0* 3.2* 3.3* 3.3*  CL 90*   < > 100 101 100 100 101  CO2 27   < > 24 24 25 26 26   GLUCOSE 97   < > 103* 112* 96 178* 109*  BUN 7   < > 8 7 6 6  5*  CREATININE 0.43*   < > 0.30* <0.30* <0.30* <0.30* 0.37*  CALCIUM 7.7*   < > 7.1* 6.9* 6.9* 7.0* 7.3*  MG 1.9  --   --  1.9 1.7 2.1 1.7  PHOS  --   --   --  <1.0*  --  <1.0* 1.3*   < > = values in this interval not displayed.    Liver Function Tests: Recent Labs  Lab 04/23/21 0500 04/24/21 0613 04/25/21 0500 04/26/21 0533 04/27/21 0434  AST 187* 147* 172* 164* 169*  ALT 143* 126* 127* 138* 148*  ALKPHOS 609* 466* 387* 375* 379*  BILITOT 12.0* 7.4* 5.7* 5.0* 4.3*  PROT 5.5* 5.3* 4.9* 4.8* 5.1*  ALBUMIN <1.5* <1.5* <1.5* <1.5* <1.5*    No  results for input(s): LIPASE, AMYLASE in the last 168 hours. No results for input(s): AMMONIA in the last 168 hours. Cardiac Enzymes: No results for input(s): CKTOTAL, CKMB, CKMBINDEX, TROPONINI in the last 168 hours. BNP (last 3 results) No results for input(s): BNP in the last 8760 hours.  ProBNP (last 3 results) No results for input(s): PROBNP in the last 8760 hours.  CBG: No results for input(s): GLUCAP in the last 168 hours. Recent Results (from the past 240 hour(s))  Culture, blood (Routine X 2) w Reflex to ID Panel     Status: None (Preliminary result)   Collection Time: 04/22/21  5:36 PM   Specimen: Porta Cath; Blood  Result Value Ref Range Status   Specimen Description   Final    PORTA CATH Performed at [facility], [place] [address], [place], [place] [address]    Special Requests   Final    BOTTLES DRAWN AEROBIC AND ANAEROBIC Blood Culture adequate volume Performed at [place] [address] [place], [place] [address]    Culture   Final    NO GROWTH 4 DAYS Performed at [facility], [place] [address], [place], [place] [address]    Report Status PENDING  Incomplete  Culture, blood (Routine X 2) w Reflex to ID Panel     Status: None (Preliminary result)   Collection Time: 04/22/21  5:37 PM   Specimen: Right Antecubital; Blood  Result Value Ref Range Status   Specimen Description   Final    RIGHT ANTECUBITAL Performed at [place] [address] [place], [place] [address]    Special Requests   Final    BOTTLES DRAWN AEROBIC ONLY Blood Culture adequate volume Performed at [place] [address] [place], [place] [address]    Culture   Final    NO GROWTH 4 DAYS Performed at [facility], [place] [address] [place], [place] [address]    Report Status PENDING  Incomplete  Resp Panel by RT-PCR (Flu A&B, Covid) Nasopharyngeal Swab  Status: None   Collection Time:  04/22/21  6:04 PM   Specimen: Nasopharyngeal Swab; Nasopharyngeal(NP) swabs in vial transport medium  Result Value Ref Range Status   SARS Coronavirus 2 by RT PCR NEGATIVE NEGATIVE Final    Comment: (NOTE) SARS-CoV-2 target nucleic acids are NOT DETECTED.  The SARS-CoV-2 RNA is generally detectable in upper respiratory specimens during the acute phase of infection. The lowest concentration of SARS-CoV-2 viral copies this assay can detect is 138 copies/mL. A negative result does not preclude SARS-Cov-2 infection and should not be used as the sole basis for treatment or other patient management decisions. A negative result may occur with  improper specimen collection/handling, submission of specimen other than nasopharyngeal swab, presence of viral mutation(s) within the areas targeted by this assay, and inadequate number of viral copies(<138 copies/mL). A negative result must be combined with clinical observations, patient history, and epidemiological information. The expected result is Negative.  Fact Sheet for Patients:  EntrepreneurPulse.com.au  Fact Sheet for Healthcare Providers:  IncredibleEmployment.be  This test is no t yet approved or cleared by the [place] FDA and  has been authorized for detection and/or diagnosis of SARS-CoV-2 by FDA under an Emergency Use Authorization (EUA). This EUA will remain  in effect (meaning this test can be used) for the duration of the COVID-19 declaration under Section 564(b)(1) of the Act, 21 U.S.C.section 360bbb-3(b)(1), unless the authorization is terminated  or revoked sooner.       Influenza A by PCR NEGATIVE NEGATIVE Final   Influenza B by PCR NEGATIVE NEGATIVE Final    Comment: (NOTE) The Xpert Xpress SARS-CoV-2/FLU/RSV plus assay is intended as an aid in the diagnosis of influenza from Nasopharyngeal swab specimens and should not be used as a sole basis for treatment. Nasal washings  and aspirates are unacceptable for Xpert Xpress SARS-CoV-2/FLU/RSV testing.  Fact Sheet for Patients: EntrepreneurPulse.com.au  Fact Sheet for Healthcare Providers: IncredibleEmployment.be  This test is not yet approved or cleared by the [place] FDA and has been authorized for detection and/or diagnosis of SARS-CoV-2 by FDA under an Emergency Use Authorization (EUA). This EUA will remain in effect (meaning this test can be used) for the duration of the COVID-19 declaration under Section 564(b)(1) of the Act, 21 U.S.C. section 360bbb-3(b)(1), unless the authorization is terminated or revoked.  Performed at [facility], [place] [address], [place], [place] [address]   MRSA Next Gen by PCR, Nasal     Status: None   Collection Time: 04/22/21  6:25 PM   Specimen: Nasal Mucosa; Nasal Swab  Result Value Ref Range Status   MRSA by PCR Next Gen NOT DETECTED NOT DETECTED Final    Comment: (NOTE) The GeneXpert MRSA Assay (FDA approved for NASAL specimens only), is one component of a comprehensive MRSA colonization surveillance program. It is not intended to diagnose MRSA infection nor to guide or monitor treatment for MRSA infections. Test performance is not FDA approved in patients less than 35 years old. Performed at [facility], [place] [address], [place], [place] [address]   Urine Culture     Status: Abnormal   Collection Time: 04/23/21  8:03 AM   Specimen: Urine, Clean Catch  Result Value Ref Range Status   Specimen Description   Final    URINE, CLEAN CATCH Performed at [facility], [place] [address], [place], [place] [address]    Special Requests   Final    NONE Performed at [facility], [place] [address], [place], [place] [address]    Culture (A)  Final    <10,000 COLONIES/mL INSIGNIFICANT GROWTH Performed at [place] [address] [place], [place]  [address]    Report Status 04/24/2021 FINAL  Final      Studies: DG Chest Port 1 View  Result Date: 04/27/2021 CLINICAL DATA:  Dyspnea EXAM: PORTABLE CHEST 1 VIEW COMPARISON:  04/22/2021 FINDINGS: Bilateral interstitial thickening. Patchy left perihilar airspace disease. Moderate right and small left pleural effusion. No pneumothorax. Stable cardiomediastinal silhouette. Right-sided Port-A-Cath in satisfactory position. No acute osseous abnormality. IMPRESSION: Moderate right and small left pleural effusion. Patchy left perihilar airspace disease concerning for pneumonia. Electronically Signed   By: Kathreen Devoid M.D.   On: 04/27/2021 09:00      Flora Lipps, MD  [facility] 04/27/2021  If 7PM-7AM, please contact night-coverage

## 2021-04-28 ENCOUNTER — Inpatient Hospital Stay (HOSPITAL_COMMUNITY): Payer: BC Managed Care – PPO

## 2021-04-28 DIAGNOSIS — R0602 Shortness of breath: Secondary | ICD-10-CM

## 2021-04-28 DIAGNOSIS — J90 Pleural effusion, not elsewhere classified: Secondary | ICD-10-CM

## 2021-04-28 LAB — URINALYSIS, ROUTINE W REFLEX MICROSCOPIC
Bacteria, UA: NONE SEEN
Glucose, UA: NEGATIVE mg/dL
Hgb urine dipstick: NEGATIVE
Ketones, ur: NEGATIVE mg/dL
Leukocytes,Ua: NEGATIVE
Nitrite: NEGATIVE
Protein, ur: 30 mg/dL — AB
Specific Gravity, Urine: 1.015 (ref 1.005–1.030)
pH: 8.5 — ABNORMAL HIGH (ref 5.0–8.0)

## 2021-04-28 LAB — COMPREHENSIVE METABOLIC PANEL
ALT: 150 U/L — ABNORMAL HIGH (ref 0–44)
AST: 168 U/L — ABNORMAL HIGH (ref 15–41)
Albumin: <1.5 g/dL — ABNORMAL LOW (ref 3.5–5.0)
Alkaline Phosphatase: 395 U/L — ABNORMAL HIGH (ref 38–126)
Anion gap: 6 (ref 5–15)
BUN: 5 mg/dL — ABNORMAL LOW (ref 6–20)
CO2: 25 mmol/L (ref 22–32)
Calcium: 7.7 mg/dL — ABNORMAL LOW (ref 8.9–10.3)
Chloride: 99 mmol/L (ref 98–111)
Creatinine, Ser: 0.36 mg/dL — ABNORMAL LOW (ref 0.44–1.00)
GFR, Estimated: >60 mL/min (ref >60)
Glucose, Bld: 107 mg/dL — ABNORMAL HIGH (ref 70–99)
Potassium: 3.5 mmol/L (ref 3.5–5.1)
Sodium: 130 mmol/L — ABNORMAL LOW (ref 135–145)
Total Bilirubin: 4.3 mg/dL — ABNORMAL HIGH (ref 0.3–1.2)
Total Protein: 5.1 g/dL — ABNORMAL LOW (ref 6.5–8.1)

## 2021-04-28 LAB — BODY FLUID CELL COUNT WITH DIFFERENTIAL
Eos, Fluid: 0 %
Lymphs, Fluid: 13 %
Monocyte-Macrophage-Serous Fluid: 53 % (ref 50–90)
Neutrophil Count, Fluid: 34 % — ABNORMAL HIGH (ref 0–25)
Total Nucleated Cell Count, Fluid: 1275 cu mm — ABNORMAL HIGH (ref 0–1000)

## 2021-04-28 LAB — CBC WITH DIFFERENTIAL/PLATELET
Abs Immature Granulocytes: 2.5 10*3/uL — ABNORMAL HIGH (ref 0.00–0.07)
Basophils Absolute: 0.2 10*3/uL — ABNORMAL HIGH (ref 0.0–0.1)
Basophils Relative: 1 %
Eosinophils Absolute: 0 10*3/uL (ref 0.0–0.5)
Eosinophils Relative: 0 %
HCT: 27.9 % — ABNORMAL LOW (ref 36.0–46.0)
Hemoglobin: 9.1 g/dL — ABNORMAL LOW (ref 12.0–15.0)
Immature Granulocytes: 12 %
Lymphocytes Relative: 6 %
Lymphs Abs: 1.2 10*3/uL (ref 0.7–4.0)
MCH: 29.8 pg (ref 26.0–34.0)
MCHC: 32.6 g/dL (ref 30.0–36.0)
MCV: 91.5 fL (ref 80.0–100.0)
Monocytes Absolute: 1.3 10*3/uL — ABNORMAL HIGH (ref 0.1–1.0)
Monocytes Relative: 6 %
Neutro Abs: 15.6 10*3/uL — ABNORMAL HIGH (ref 1.7–7.7)
Neutrophils Relative %: 75 %
Platelets: 188 10*3/uL (ref 150–400)
RBC: 3.05 MIL/uL — ABNORMAL LOW (ref 3.87–5.11)
RDW: 16.5 % — ABNORMAL HIGH (ref 11.5–15.5)
WBC: 20.8 10*3/uL — ABNORMAL HIGH (ref 4.0–10.5)
nRBC: 2.4 % — ABNORMAL HIGH (ref 0.0–0.2)

## 2021-04-28 LAB — ECHOCARDIOGRAM COMPLETE
Area-P 1/2: 5.66 cm2
Calc EF: 60.8 %
Height: 63 in
S' Lateral: 2.1 cm
Single Plane A2C EF: 66.9 %
Single Plane A4C EF: 54.4 %
Weight: 2899.49 oz

## 2021-04-28 LAB — MAGNESIUM: Magnesium: 1.5 mg/dL — ABNORMAL LOW (ref 1.7–2.4)

## 2021-04-28 LAB — LACTATE DEHYDROGENASE, PLEURAL OR PERITONEAL FLUID: LD, Fluid: 587 U/L — ABNORMAL HIGH (ref 3–23)

## 2021-04-28 LAB — PROTEIN, PLEURAL OR PERITONEAL FLUID: Total protein, fluid: <3 g/dL

## 2021-04-28 LAB — CORTISOL-AM, BLOOD: Cortisol - AM: 16.1 ug/dL (ref 6.7–22.6)

## 2021-04-28 IMAGING — US US THORACENTESIS ASP PLEURAL SPACE W/IMG GUIDE
1 series · 3 of 3 positions shown · non-contrast
Comparison: none

INDICATION: History of metastatic breast cancer. Shortness of breath, pleural
effusion seen on previous chest x-ray. Request for therapeutic and
diagnostic thoracentesis.

[Series 1: us thoracentesis asp pleural space w/img guide · 3 of 3 slices shown]
[im 1/3]
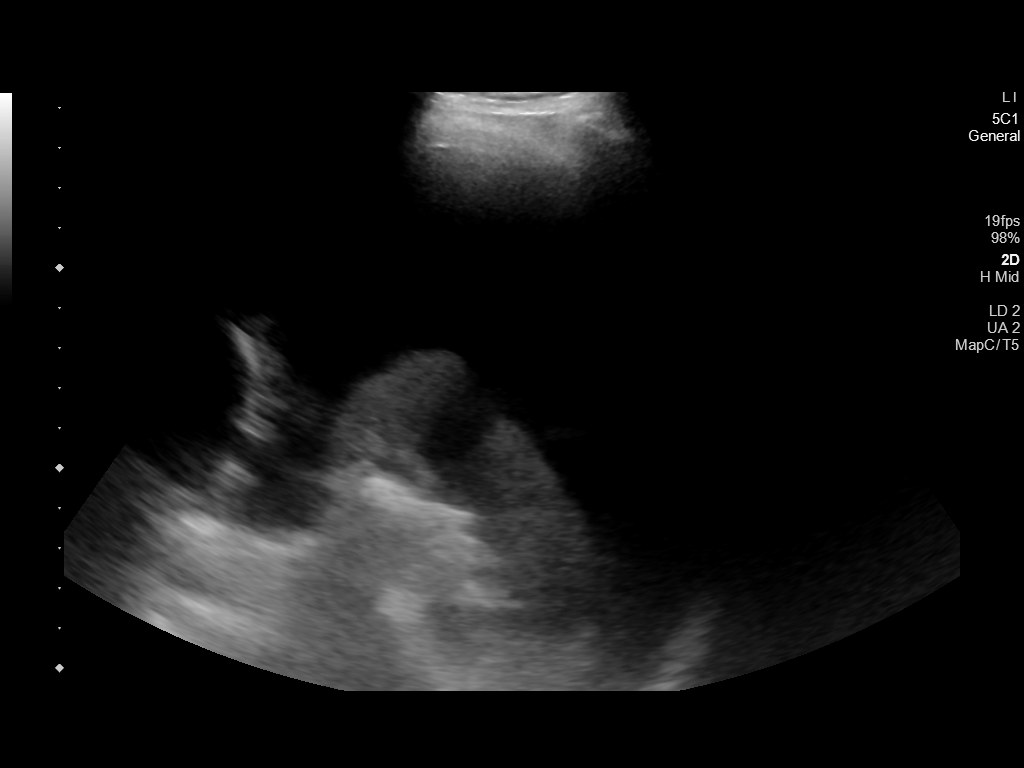
[im 2/3]
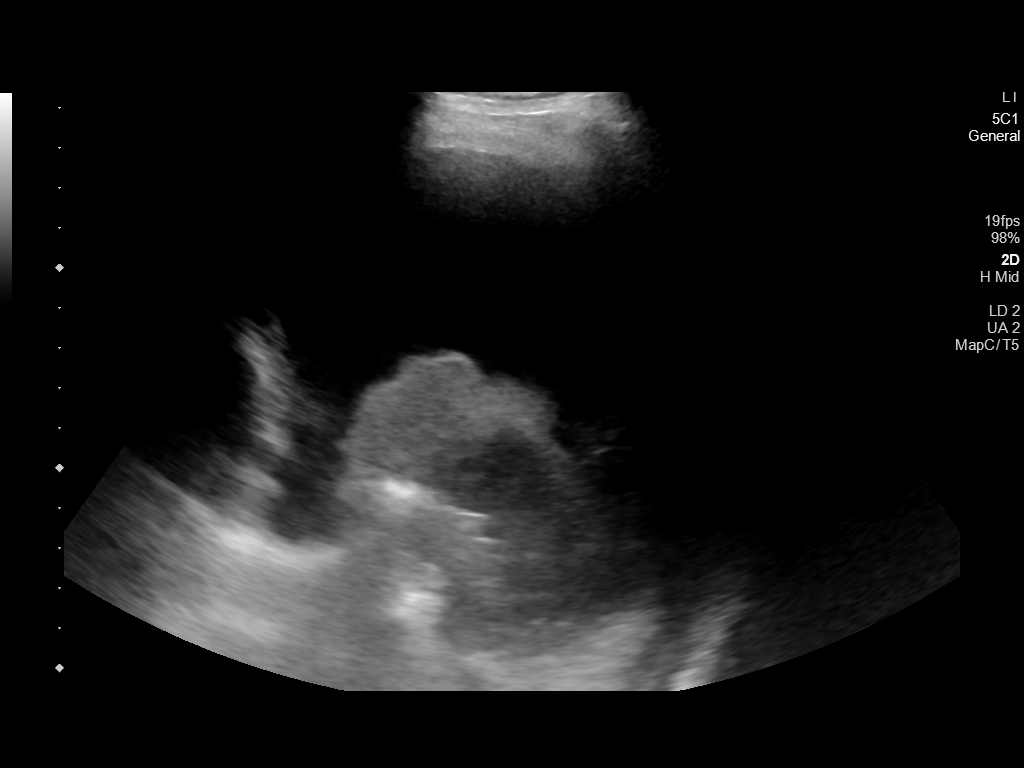
[im 3/3]
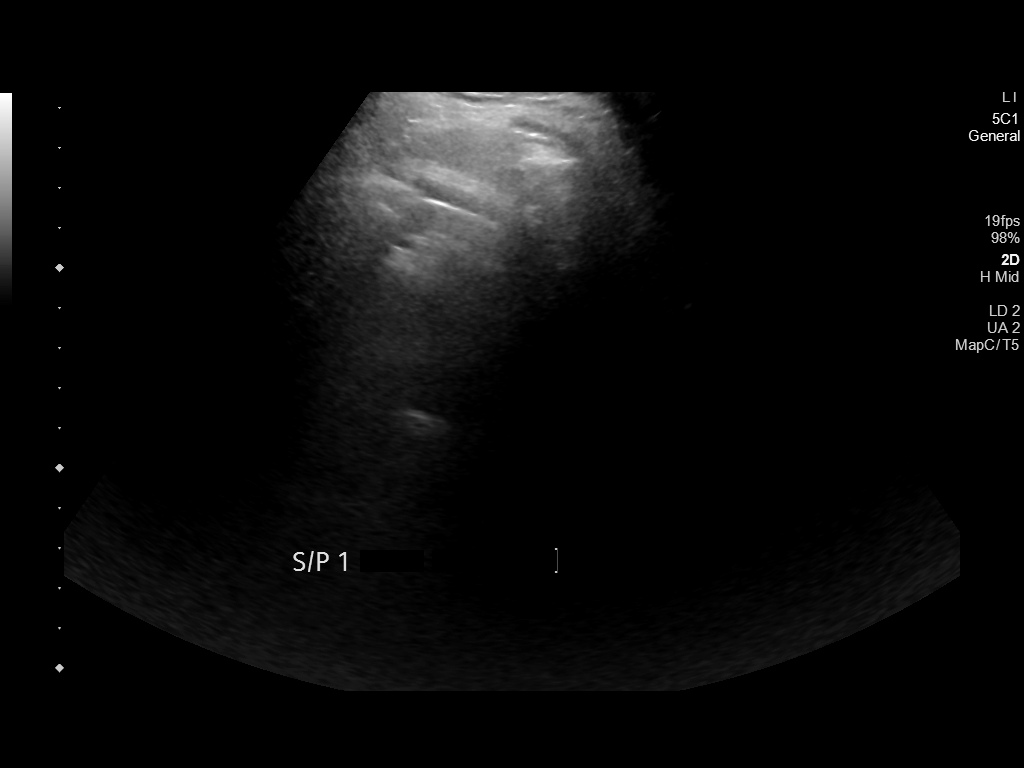

[3 of 3 positions shown; findings below may reference images not displayed]

EXAM:
ULTRASOUND GUIDED RIGHT THORACENTESIS

MEDICATIONS:
15 mL 1% lidocaine

COMPLICATIONS:
None immediate.

PROCEDURE:
An ultrasound guided thoracentesis was thoroughly discussed with the
patient and questions answered. The benefits, risks, alternatives
and complications were also discussed. The patient understands and
wishes to proceed with the procedure. Written consent was obtained.

Ultrasound was performed to localize and mark an adequate pocket of
fluid in the right chest. The area was then prepped and draped in
the normal sterile fashion. 1% Lidocaine was used for local
anesthesia. Under ultrasound guidance a 6 Fr Safe-T-Centesis
catheter was introduced. Thoracentesis was performed. The catheter
was removed and a dressing applied.
FINDINGS: A total of approximately 1 L of hazy brown fluid was removed.
Samples were sent to the laboratory as requested by the clinical
team.

Post procedure chest X-ray reviewed, negative for pneumothorax.
IMPRESSION: Successful ultrasound guided right thoracentesis yielding 1 L of
pleural fluid.

## 2021-04-28 MED ORDER — LIDOCAINE HCL 1 % IJ SOLN
INTRAMUSCULAR | Status: AC
  Administered 2021-04-28: 10 mL
  Filled 2021-04-28: qty 20

## 2021-04-28 MED ORDER — PERFLUTREN LIPID MICROSPHERE
1.0000 mL | INTRAVENOUS | Status: AC | PRN
  Administered 2021-04-28: 2 mL via INTRAVENOUS
  Filled 2021-04-28: qty 10

## 2021-04-28 MED ORDER — MAGNESIUM OXIDE -MG SUPPLEMENT 400 (240 MG) MG PO TABS
400.0000 mg | ORAL_TABLET | Freq: Every day | ORAL | Status: AC
  Administered 2021-04-28 – 2021-04-29 (×2): 400 mg via ORAL
  Filled 2021-04-28 (×2): qty 1

## 2021-04-28 MED ORDER — LIDOCAINE VISCOUS HCL 2 % MT SOLN
15.0000 mL | OROMUCOSAL | Status: DC | PRN
  Filled 2021-04-28: qty 15

## 2021-04-28 MED ORDER — MIDODRINE HCL 2.5 MG PO TABS
2.5000 mg | ORAL_TABLET | Freq: Three times a day (TID) | ORAL | Status: AC
  Administered 2021-04-28 – 2021-04-29 (×3): 2.5 mg via ORAL
  Filled 2021-04-28 (×3): qty 1

## 2021-04-28 NOTE — Progress Notes (Signed)
°   04/28/21 0723  Assess: MEWS Score  Temp 98.2 F (36.8 C)  BP 91/64  Pulse Rate (!) 135  ECG Heart Rate (!) 136  Resp 16  SpO2 97 %  O2 Device Nasal Cannula  Assess: MEWS Score  MEWS Temp 0  MEWS Systolic 1  MEWS Pulse 3  MEWS RR 0  MEWS LOC 0  MEWS Score 4  MEWS Score Color Red  Assess: if the MEWS score is Yellow or Red  Were vital signs taken at a resting state? Yes  Focused Assessment No change from prior assessment  Does the patient meet 2 or more of the SIRS criteria? Yes  Does the patient have a confirmed or suspected source of infection? Yes  Provider and Rapid Response Notified? Yes  MEWS guidelines implemented *See Row Information* Yes  Treat  Pain Scale 0-10  Pain Score 0  Take Vital Signs  Increase Vital Sign Frequency  Red: Q 1hr X 4 then Q 4hr X 4, if remains red, continue Q 4hrs  Escalate  MEWS: Escalate Red: discuss with charge nurse/RN and provider, consider discussing with RRT  Notify: Charge Nurse/RN  Name of Charge Nurse/RN Notified [place]  Date Charge Nurse/RN Notified 04/28/21  Time Charge Nurse/RN Notified 0900  Notify: Provider  Provider Name/Title Florencia Reasons, MD  Date Provider Notified 04/28/21  Time Provider Notified [phone]  Notification Type Page (secure chat)  Notification Reason Other (Comment) (Red MEWS)  Provider response See new orders  Date of Provider Response 04/28/21  Time of Provider Response 0750  Document  Progress note created (see row info) Yes  Assess: SIRS CRITERIA  SIRS Temperature  0  SIRS Pulse 1  SIRS Respirations  0  SIRS WBC 1  SIRS Score Sum  2

## 2021-04-28 NOTE — Progress Notes (Signed)
° °  Palliative Medicine Inpatient Follow Up Note     Chart Reviewed. Patient assessed at the bedside. Denies pain.   Paige Perry is sitting up in bed eating lunch. She is now on 3L/[place] oxygen due to some shortness of breath. Reports she is feeling better today except for ongoing weakness/fatigue. She is anxiously awaiting thoracentesis today with hopes of decreasing oxygen use.   She is appreciative of the ongoing care and support during her hospitalization. She continues to emphasize her goal of continued and aggressive treatment with hopes of stability or maybe even improvement. She is not at a place to consider anything outside of "moving forward".   We discussed her ongoing weakness and future outlook. She is requesting a lightweight wheelchair for use in the home. Request acknowledged.  Discussed the importance of continued conversation with family and their  medical providers regarding overall plan of care and treatment options, ensuring decisions are within the context of the patients values and GOCs.   Questions addressed and support provided.    Objective Assessment: Vital Signs Vitals:   04/28/21 1954 04/28/21 2104  BP: 92/73 (!) 84/61  Pulse: (!) 132 (!) 134  Resp: 18   Temp: 98.2 F (36.8 C)   SpO2: 96%     Intake/Output Summary (Last 24 hours) at 04/28/2021 2136 Last data filed at 04/28/2021 1700 Gross per 24 hour  Intake 3 ml  Output 1000 ml  Net -997 ml   Last Weight  Most recent update: 04/28/2021  7:23 AM    Weight  82.2 kg (181 lb 3.5 oz)            Gen:  NAD, weak, frail appearing  CV: Tachycardic PULM: diminished  ABD: soft/nontender/nondistended/normal bowel sounds Neuro: Alert and oriented x3  SUMMARY OF RECOMMENDATIONS   Continue with current plan of care per medical team  Ongoing goals of care discussions and support.  Pending thoracentesis later today  No symptom management needs at this time, denies pain.  PMT will continue to support and  follow on as needed basis. Please secure chat for urgent needs.   Time Total: 20 min.   Visit consisted of counseling and education dealing with the complex and emotionally intense issues of symptom management and palliative care in the setting of serious and potentially life-threatening illness.Greater than 50%  of this time was spent counseling and coordinating care related to the above assessment and plan.  Alda Lea, AGPCNP-BC  Palliative Medicine Team [phone]  Palliative Medicine Team providers are available by phone from 7am to 7pm daily and can be reached through the team cell phone. Should this patient require assistance outside of these hours, please call the patient's attending physician.

## 2021-04-28 NOTE — TOC Initial Note (Signed)
Transition of Care [facility]) - Initial/Assessment Note    Patient Details  Name: Paige Perry MRN: [id number] Date of Birth: June 30, 1975  Transition of Care [facility]) CM/SW Contact:    Draper Gallon, Marjie Skiff, RN Phone Number: 04/28/2021, 1:07 PM  Clinical Narrative:                 Pt was a pending outpatient palliative patient with [facility] and they are following along during pt hospital stay. [facility] will continue to follow along and assist with disposition planning as needed.  Expected Discharge Plan: Home/Self Care Barriers to Discharge: Continued Medical Work up    Expected Discharge Plan and Services Expected Discharge Plan: Home/Self Care   Discharge Planning Services: CM Consult   Living arrangements for the past 2 months: Single Family Home                   Prior Living Arrangements/Services Living arrangements for the past 2 months: Single Family Home   Patient language and need for interpreter reviewed:: Yes        Need for Family Participation in Patient Care: Yes (Comment) Care giver support system in place?: Yes (comment)   Criminal Activity/Legal Involvement Pertinent to Current Situation/Hospitalization: No - Comment as needed  Activities of Daily Living Home Assistive Devices/Equipment: Walker (specify type), Eyeglasses ADL Screening (condition at time of admission) Patient's cognitive ability adequate to safely complete daily activities?: Yes Is the patient deaf or have difficulty hearing?: No Does the patient have difficulty seeing, even when wearing glasses/contacts?: No Does the patient have difficulty concentrating, remembering, or making decisions?: Yes Patient able to express need for assistance with ADLs?: Yes Does the patient have difficulty dressing or bathing?: Yes Independently performs ADLs?: No Communication: Independent Dressing (OT): Needs assistance Is this a change from baseline?: Pre-admission baseline Grooming: Needs assistance Is this a change  from baseline?: Pre-admission baseline Feeding: Needs assistance Is this a change from baseline?: Pre-admission baseline Bathing: Needs assistance Is this a change from baseline?: Pre-admission baseline Toileting: Needs assistance Is this a change from baseline?: Pre-admission baseline In/Out Bed: Needs assistance Is this a change from baseline?: Pre-admission baseline Walks in Home: Needs assistance Is this a change from baseline?: Pre-admission baseline Does the patient have difficulty walking or climbing stairs?: Yes Weakness of Legs: Both Weakness of Arms/Hands: Both  Permission Sought/Granted                  Emotional Assessment         Alcohol / Substance Use: Not Applicable Psych Involvement: No (comment)  Admission diagnosis:  Neutropenic fever ([place]) [D70.9, R50.81] Patient Active Problem List   Diagnosis Date Noted   Pressure injury of skin 04/23/2021   Neutropenic fever ([place]) 04/22/2021   Hypokalemia 04/21/2021   Liver metastases ([place]) 04/19/2021   Port-A-Cath in place 03/26/2019   Genetic testing 02/01/2019   Family history of prostate cancer    Family history of throat cancer    Family history of breast cancer    Malignant neoplasm of upper-outer quadrant of left breast in female, estrogen receptor positive ([place]) 01/18/2019   Other allergic rhinitis 12/03/2014   PCP:  [name], NP Pharmacy:   [id number] - [place], [place] [address] Phone: [phone] Fax: [phone]     Social Determinants of Health (SDOH) Interventions    Readmission Risk Interventions Readmission Risk Prevention Plan 04/28/2021  Transportation Screening Complete  PCP or Specialist Appt within  3-5 Days Complete  HRI or Home Care Consult Complete  Social Work Consult for [place] Planning/Counseling Complete  Palliative Care Screening Complete  Medication Review Press photographer) Complete  Some  recent data might be hidden

## 2021-04-28 NOTE — Progress Notes (Signed)
PT Cancellation Note  Patient Details Name: Paige Perry MRN: [id number] DOB: 12-07-1975   Cancelled Treatment:    Reason Eval/Treat Not Completed: Other (comment). Pt requests to hold therapy until after she has thoracentesis, requests therapist check back tomorrow.    Tori Coryn Mosso PT, DPT 04/28/21, 10:52 AM

## 2021-04-28 NOTE — Progress Notes (Signed)
PROGRESS NOTE    Paige Perry  ZTI:[id number] DOB: 1975/12/03 DOA: 04/22/2021 PCP: [name], NP    No chief complaint on file.   Brief Narrative:  Paige Perry is a 46 y.o. female with past medical history of metastatic breast cancer to the liver received first cycle of Abraxane and pembrolizumab treatment on 04/15/2021, was sent from oncology office with complaints of sore throat, fatigue weakness ,fever, tachycardia and hypotension, found to have neutropenic fever   received GCSF on 1/26   Subjective:  Laying in bed, denies pain, on l.5 liter oxygen, no cough Reports feeling too weak to get thoracentesis done yesterday, I showed her the right side pleural effusion on ct scan, think her heart rate might be getting better after thoracentesis, she is agreeable to have it done today She wants regular diet, she reports mouth sores are resolved  She does not feel like she can do PT today, if she is going to get thoracentesis today   Assessment & Plan:   Principal Problem:   Neutropenic fever ([place]) Active Problems:   Malignant neoplasm of upper-outer quadrant of left breast in female, estrogen receptor positive ([place])   Liver metastases ([place])   Pressure injury of skin  Neutropenic fever/oral ulceration -Presented with severe neutropenia, received G-CSF on 1/26, now with leukocytosis -She received cefepime and from 1 26-1 29 -Continue day 7 of Diflucan due to oral ulcer -Blood culture on presentation no growth, due to persistent fever, will repeat blood culture, check UA, pleural fluid study -Repeat mild sore has improved, want to advance diet to regular diet  Sinus tachycardia/hypotension -CTA no PE, does show increased large right pleural effusion with complete collapse of right lower lobe and right middle lobe, new groundglass opacities of left upper lobe and left lower lobe concerning for infection or inflammatory process such at drug reaction -A.m. cortisol level  pending -Echocardiogram pending -TSH unremarkable -UA and blood culture pending -Start midodrine for blood pressure support  Hyponatremia Hypokalemia Hypomagnesemia Hypophosphatemia   Normocytic anemia in the setting of malignancy  Metastatic breast cancer Elevated LFT with known liver mets there is biliary ductal dilatation Cancer pain  FTT, poor prognosis, palliative care consulted     Body mass index is 32.1 kg/m.Marland Kitchen Seen by dietician.  I agree with the assessment and plan as outlined below: Nutrition Status: Nutrition Problem: Increased nutrient needs Etiology: cancer and cancer related treatments Signs/Symptoms: estimated needs Interventions: Ensure Enlive (each supplement provides 350kcal and 20 grams of protein), MVI  .     Skin Assessment: I have examined the patients skin and I agree with the wound assessment as performed by the wound care RN as outlined below:  Pressure Injury 04/22/21 Sacrum Mid Stage 2 -  Partial thickness loss of dermis presenting as a shallow open injury with a red, pink wound bed without slough. (Active)  04/22/21 1700  Location: Sacrum  Location Orientation: Mid  Staging: Stage 2 -  Partial thickness loss of dermis presenting as a shallow open injury with a red, pink wound bed without slough.  Wound Description (Comments):   Present on Admission: Yes    Unresulted Labs (From admission, onward)     Start     Ordered   04/29/21 0500  Phosphorus  Tomorrow morning,   R       Question:  Specimen collection method  Answer:  Unit=Unit collect   04/28/21 1543   04/28/21 1438  Miscellaneous [facility] test (send-out)  RELEASE UPON ORDERING,  TIMED        04/28/21 1438   04/28/21 1438  Body fluid culture w Gram Stain  RELEASE UPON ORDERING,   TIMED        04/28/21 1438   04/28/21 1438  Body fluid cell count with differential  RELEASE UPON ORDERING,   TIMED        04/28/21 1438   04/28/21 1438  Gram stain  RELEASE UPON ORDERING,   TIMED         04/28/21 1438   04/28/21 1201  Culture, blood (routine x 2)  BLOOD CULTURE X 2,   R (with TIMED occurrences)      04/28/21 1200   04/28/21 0500  Cortisol-am, blood  Tomorrow morning,   R       Question:  Specimen collection method  Answer:  Unit=Unit collect   04/27/21 1543   04/26/21 0500  CBC with Differential/Platelet  Daily,   R     Question:  Specimen collection method  Answer:  Unit=Unit collect   04/25/21 0917   04/26/21 0500  Comprehensive metabolic panel  Daily,   R     Question:  Specimen collection method  Answer:  Unit=Unit collect   04/25/21 0917   04/26/21 0500  Magnesium  Daily,   R     Question:  Specimen collection method  Answer:  Unit=Unit collect   04/25/21 0917              DVT prophylaxis: enoxaparin (LOVENOX) injection 40 mg Start: 04/22/21 2200 SCDs Start: 04/22/21 1716   Code Status: Full Family Communication: Patient Disposition:   Status is: Inpatient  Dispo: The patient is from: Home              Anticipated d/c is to: TBD              Anticipated d/c date is: TBD               Consultants:  Oncology Dr. Langston Masker Palliative care  Procedures:  Right thoracentesis  Antimicrobials:    Anti-infectives (From admission, onward)    Start     Dose/Rate Route Frequency Ordered Stop   04/24/21 1400  fluconazole (DIFLUCAN) tablet 100 mg        100 mg Oral Daily 04/24/21 1057     04/22/21 2000  fluconazole (DIFLUCAN) IVPB 100 mg  Status:  Discontinued        100 mg 50 mL/hr over 60 Minutes Intravenous Every 24 hours 04/22/21 1724 04/24/21 1057   04/22/21 1800  ceFEPIme (MAXIPIME) 2 g in sodium chloride 0.9 % 100 mL IVPB  Status:  Discontinued        2 g 200 mL/hr over 30 Minutes Intravenous Every 8 hours 04/22/21 1716 04/25/21 0741          Objective: Vitals:   04/28/21 1154 04/28/21 1349 04/28/21 1426 04/28/21 1451  BP: (!) 84/61  (!) 87/62 (!) 77/44  Pulse: (!) 128     Resp: 20     Temp: (!) 100.6 F (38.1 C) 97.8 F (36.6  C)    TempSrc: Oral Oral    SpO2: 100%     Weight:      Height:        Intake/Output Summary (Last 24 hours) at 04/28/2021 1547 Last data filed at 04/28/2021 1409 Gross per 24 hour  Intake 123 ml  Output 850 ml  Net -727 ml   Filed Weights   04/24/21 0630 04/27/21 0403  04/28/21 0506  Weight: 81.9 kg 88.5 kg 82.2 kg    Examination:  General exam: Very frail and weak, AAOx3 Respiratory system:  diminished on right side, Respiratory effort normal. Cardiovascular system:  sinus tachycardia Gastrointestinal system: Abdomen is nondistended, soft and nontender.  Normal bowel sounds heard. Central nervous system: Alert and oriented. No focal neurological deficits. Extremities:  mild dependent pedal edema bialterally Skin: No rashes, lesions or ulcers Psychiatry: Judgement and insight appear normal. Mood & affect appropriate.     Data Reviewed: I have personally reviewed following labs and imaging studies  CBC: Recent Labs  Lab 04/24/21 0613 04/25/21 0500 04/26/21 0533 04/27/21 0434 04/28/21 0454  WBC 3.5* 12.4* 22.1* 25.0* 20.8*  NEUTROABS 2.2 9.4* 18.1* 18.9* 15.6*  HGB 10.3* 9.7* 9.6* 9.4* 9.1*  HCT 30.6* 29.3* 28.9* 28.6* 27.9*  MCV 88.4 89.1 88.7 89.9 91.5  PLT 181 162 149* 144* 597    Basic Metabolic Panel: Recent Labs  Lab 04/25/21 0500 04/25/21 2114 04/26/21 0533 04/27/21 0434 04/28/21 0454  NA 130* 130* 130* 131* 130*  K 3.0* 3.2* 3.3* 3.3* 3.5  CL 101 100 100 101 99  CO2 24 25 26 26 25   GLUCOSE 112* 96 178* 109* 107*  BUN 7 6 6  5* 5*  CREATININE <0.30* <0.30* <0.30* 0.37* 0.36*  CALCIUM 6.9* 6.9* 7.0* 7.3* 7.7*  MG 1.9 1.7 2.1 1.7 1.5*  PHOS <1.0*  --  <1.0* 1.3*  --     GFR: Estimated Creatinine Clearance: 89.2 mL/min (A) (by C-G formula based on SCr of 0.36 mg/dL (L)).  Liver Function Tests: Recent Labs  Lab 04/24/21 0613 04/25/21 0500 04/26/21 0533 04/27/21 0434 04/28/21 0454  AST 147* 172* 164* 169* 168*  ALT 126* 127* 138* 148* 150*   ALKPHOS 466* 387* 375* 379* 395*  BILITOT 7.4* 5.7* 5.0* 4.3* 4.3*  PROT 5.3* 4.9* 4.8* 5.1* 5.1*  ALBUMIN <1.5* <1.5* <1.5* <1.5* <1.5*    CBG: No results for input(s): GLUCAP in the last 168 hours.   Recent Results (from the past 240 hour(s))  Culture, blood (Routine X 2) w Reflex to ID Panel     Status: None   Collection Time: 04/22/21  5:36 PM   Specimen: Porta Cath; Blood  Result Value Ref Range Status   Specimen Description   Final    PORTA CATH Performed at [facility], [place] [address], [place], [place] [address]    Special Requests   Final    BOTTLES DRAWN AEROBIC AND ANAEROBIC Blood Culture adequate volume Performed at [place] [address] [place], [place] [address]    Culture   Final    NO GROWTH 5 DAYS Performed at [facility], [place] [address] [place], [place] [address]    Report Status 04/27/2021 FINAL  Final  Culture, blood (Routine X 2) w Reflex to ID Panel     Status: None   Collection Time: 04/22/21  5:37 PM   Specimen: Right Antecubital; Blood  Result Value Ref Range Status   Specimen Description   Final    RIGHT ANTECUBITAL Performed at [place] [address], [place], [place] [address]    Special Requests   Final    BOTTLES DRAWN AEROBIC ONLY Blood Culture adequate volume Performed at [facility] [address] [place], [place] [address]    Culture   Final    NO GROWTH 5 DAYS Performed at [facility], [address] [place], [place] [address]    Report Status 04/27/2021 FINAL  Final  Resp Panel by RT-PCR (Flu A&B, Covid) Nasopharyngeal Swab     Status: None   Collection Time: 04/22/21  6:04 PM   Specimen: Nasopharyngeal Swab; Nasopharyngeal(NP) swabs in vial transport medium  Result Value Ref Range Status   SARS Coronavirus 2 by RT PCR NEGATIVE NEGATIVE Final    Comment: (NOTE) SARS-CoV-2 target nucleic acids are NOT DETECTED.  The  SARS-CoV-2 RNA is generally detectable in upper respiratory specimens during the acute phase of infection. The lowest concentration of SARS-CoV-2 viral copies this assay can detect is 138 copies/mL. A negative result does not preclude SARS-Cov-2 infection and should not be used as the sole basis for treatment or other patient management decisions. A negative result may occur with  improper specimen collection/handling, submission of specimen other than nasopharyngeal swab, presence of viral mutation(s) within the areas targeted by this assay, and inadequate number of viral copies(<138 copies/mL). A negative result must be combined with clinical observations, patient history, and epidemiological information. The expected result is Negative.  Fact Sheet for Patients:  EntrepreneurPulse.com.au  Fact Sheet for Healthcare Providers:  IncredibleEmployment.be  This test is no t yet approved or cleared by the [place] FDA and  has been authorized for detection and/or diagnosis of SARS-CoV-2 by FDA under an Emergency Use Authorization (EUA). This EUA will remain  in effect (meaning this test can be used) for the duration of the COVID-19 declaration under Section 564(b)(1) of the Act, 21 U.S.C.section 360bbb-3(b)(1), unless the authorization is terminated  or revoked sooner.       Influenza A by PCR NEGATIVE NEGATIVE Final   Influenza B by PCR NEGATIVE NEGATIVE Final    Comment: (NOTE) The Xpert Xpress SARS-CoV-2/FLU/RSV plus assay is intended as an aid in the diagnosis of influenza from Nasopharyngeal swab specimens and should not be used as a sole basis for treatment. Nasal washings and aspirates are unacceptable for Xpert Xpress SARS-CoV-2/FLU/RSV testing.  Fact Sheet for Patients: EntrepreneurPulse.com.au  Fact Sheet for Healthcare Providers: IncredibleEmployment.be  This test is not yet approved or  cleared by the [place] FDA and has been authorized for detection and/or diagnosis of SARS-CoV-2 by FDA under an Emergency Use Authorization (EUA). This EUA will remain in effect (meaning this test can be used) for the duration of the COVID-19 declaration under Section 564(b)(1) of the Act, 21 U.S.C. section 360bbb-3(b)(1), unless the authorization is terminated or revoked.  Performed at [facility], [place] [address] [place], [place] [address]   MRSA Next Gen by PCR, Nasal     Status: None   Collection Time: 04/22/21  6:25 PM   Specimen: Nasal Mucosa; Nasal Swab  Result Value Ref Range Status   MRSA by PCR Next Gen NOT DETECTED NOT DETECTED Final    Comment: (NOTE) The GeneXpert MRSA Assay (FDA approved for NASAL specimens only), is one component of a comprehensive MRSA colonization surveillance program. It is not intended to diagnose MRSA infection nor to guide or monitor treatment for MRSA infections. Test performance is not FDA approved in patients less than 20 years old. Performed at [facility], [place] [address] [place], [place] [address]   Urine Culture     Status: Abnormal   Collection Time: 04/23/21  8:03 AM   Specimen: Urine, Clean Catch  Result Value Ref Range Status   Specimen Description   Final    URINE, CLEAN CATCH Performed  at [facility], [place] [address], [place], [place] [address]    Special Requests   Final    NONE Performed at [facility], [place] [address] [place], [place] [address]    Culture (A)  Final    <10,000 COLONIES/mL INSIGNIFICANT GROWTH Performed at [place] [address], [place], [place] [address]    Report Status 04/24/2021 FINAL  Final         Radiology Studies: CT Angio Chest Pulmonary Embolism (PE) W or WO Contrast  Result Date: 04/27/2021 CLINICAL DATA:  Shortness of breath EXAM: CT ANGIOGRAPHY CHEST WITH CONTRAST TECHNIQUE: Multidetector  CT imaging of the chest was performed using the standard protocol during bolus administration of intravenous contrast. Multiplanar CT image reconstructions and MIPs were obtained to evaluate the vascular anatomy. RADIATION DOSE REDUCTION: This exam was performed according to the departmental dose-optimization program which includes automated exposure control, adjustment of the mA and/or kV according to patient size and/or use of iterative reconstruction technique. CONTRAST:  45mL OMNIPAQUE IOHEXOL 350 MG/ML SOLN COMPARISON:  Chest CT dated April 05, 2021 FINDINGS: Cardiovascular: Adequate contrast opacification of the pulmonary arteries. Limited evaluation of the segmental and subsegmental pulmonary arteries due to motion artifact and streak artifact. Normal heart size. No pericardial effusion. Normal caliber thoracic aorta. Mediastinum/Nodes: Enlarged left axillary, mediastinal, hilar, and internal mammary lymph nodes are decreased in size when compared with prior exam. Reference left axillary lymph node measuring 2.0 cm in short axis on series 5, image 67, previously measured 2.3 cm in short axis. Reference AP window lymph node measures 0.7 cm on image 94, previously measured 1.5 cm. Reference left internal mammary lymph node measures 0.8 cm in short axis, previously 1.1 cm. Lungs/Pleura: Increased large right pleural effusion with complete collapse of the right lower lobe and right middle lobe. New small left pleural effusion. New ground-glass opacities of the left upper lobe and left lower lobe. Upper Abdomen: Similar widespread metastatic disease seen of the liver with dominant right hepatic lobe mass and unchanged biliary ductal dilation. Musculoskeletal: No aggressive appearing osseous lesions. Review of the MIP images confirms the above findings. IMPRESSION: 1. No evidence of central pulmonary embolus, evaluation of the more distal pulmonary arteries is limited due to artifact. 2. Increased large right  pleural effusion with complete collapse of the right lower lobe and right middle lobe. New small left pleural effusion. 3. New ground-glass opacities of the left upper lobe and left lower lobe, concerning for infection or inflammatory process such as drug reaction. 4. Enlarged left axillary, mediastinal, hilar, and internal mammary lymph nodes are decreased in size when compared with prior exam. 5. Similar widespread hepatic metastatic disease and biliary ductal dilation. Electronically Signed   By: Yetta Glassman M.D.   On: 04/27/2021 14:19   DG CHEST PORT 1 VIEW  Result Date: 04/28/2021 CLINICAL DATA:  Status post thoracentesis EXAM: PORTABLE CHEST 1 VIEW COMPARISON:  04/27/2021 FINDINGS: Interval reduction in volume of a layering right pleural effusion. No significant pneumothorax. Probable small, layering left pleural effusion and some heterogeneous airspace opacities of the left lung. Right chest port catheter. Heart and mediastinum are unremarkable. IMPRESSION: 1. Interval reduction in volume of a layering right pleural effusion. No significant pneumothorax. 2. Probable small, layering left pleural effusion and some heterogeneous airspace opacities of the left lung. Electronically Signed   By: Delanna Ahmadi M.D.   On: 04/28/2021 14:56   DG Chest Port 1 View  Result Date:  04/27/2021 CLINICAL DATA:  Dyspnea EXAM: PORTABLE CHEST 1 VIEW COMPARISON:  04/22/2021 FINDINGS: Bilateral interstitial thickening. Patchy left perihilar airspace disease. Moderate right and small left pleural effusion. No pneumothorax. Stable cardiomediastinal silhouette. Right-sided Port-A-Cath in satisfactory position. No acute osseous abnormality. IMPRESSION: Moderate right and small left pleural effusion. Patchy left perihilar airspace disease concerning for pneumonia. Electronically Signed   By: Kathreen Devoid M.D.   On: 04/27/2021 09:00        Scheduled Meds:  Chlorhexidine Gluconate Cloth  6 each Topical Daily    enoxaparin (LOVENOX) injection  40 mg Subcutaneous Q24H   feeding supplement  237 mL Oral TID BM   fentaNYL  1 patch Transdermal Q72H   fluconazole  100 mg Oral Daily   magnesium oxide  400 mg Oral Daily   metoprolol tartrate  12.5 mg Oral BID   midodrine  2.5 mg Oral TID WC   multivitamin with minerals  1 tablet Oral Daily   pantoprazole  40 mg Oral BID   sodium chloride flush  3 mL Intravenous Q12H   Continuous Infusions:  sodium chloride       LOS: 6 days    Greater than 50% of this time was spent in counseling, explanation of diagnosis, planning of further management, and coordination of care.   Voice Recognition Viviann Spare dictation system was used to create this note, attempts have been made to correct errors. Please contact the author with questions and/or clarifications.   Florencia Reasons, MD PhD FACP [facility]  Available via Epic secure chat 7am-7pm for nonurgent issues Please page for urgent issues To page the attending provider between 7A-7P or the covering provider during after hours 7P-7A, please log into the web site [email or web address] and access using universal  password for that web site. If you do not have the password, please call the hospital operator.    04/28/2021, 3:47 PM

## 2021-04-28 NOTE — Procedures (Signed)
PROCEDURE SUMMARY:  Successful image-guided right thoracentesis. Yielded 1 L of hazy brown fluid. Pt tolerated procedure well. No immediate complications. EBL = trace   Specimen was sent for labs. CXR ordered.  Please see imaging section of Epic for full dictation.  Armando Gang Timaya Bojarski PA-C 04/28/2021 2:44 PM

## 2021-04-29 LAB — CBC WITH DIFFERENTIAL/PLATELET
Abs Immature Granulocytes: 1.83 10*3/uL — ABNORMAL HIGH (ref 0.00–0.07)
Basophils Absolute: 0.1 10*3/uL (ref 0.0–0.1)
Basophils Relative: 1 %
Eosinophils Absolute: 0 10*3/uL (ref 0.0–0.5)
Eosinophils Relative: 0 %
HCT: 27.9 % — ABNORMAL LOW (ref 36.0–46.0)
Hemoglobin: 9.1 g/dL — ABNORMAL LOW (ref 12.0–15.0)
Immature Granulocytes: 10 %
Lymphocytes Relative: 7 %
Lymphs Abs: 1.2 10*3/uL (ref 0.7–4.0)
MCH: 30 pg (ref 26.0–34.0)
MCHC: 32.6 g/dL (ref 30.0–36.0)
MCV: 92.1 fL (ref 80.0–100.0)
Monocytes Absolute: 1 10*3/uL (ref 0.1–1.0)
Monocytes Relative: 6 %
Neutro Abs: 13.5 10*3/uL — ABNORMAL HIGH (ref 1.7–7.7)
Neutrophils Relative %: 76 %
Platelets: 220 10*3/uL (ref 150–400)
RBC: 3.03 MIL/uL — ABNORMAL LOW (ref 3.87–5.11)
RDW: 17 % — ABNORMAL HIGH (ref 11.5–15.5)
WBC: 17.8 10*3/uL — ABNORMAL HIGH (ref 4.0–10.5)
nRBC: 1.8 % — ABNORMAL HIGH (ref 0.0–0.2)

## 2021-04-29 LAB — COMPREHENSIVE METABOLIC PANEL
ALT: 172 U/L — ABNORMAL HIGH (ref 0–44)
AST: 196 U/L — ABNORMAL HIGH (ref 15–41)
Albumin: <1.5 g/dL — ABNORMAL LOW (ref 3.5–5.0)
Alkaline Phosphatase: 370 U/L — ABNORMAL HIGH (ref 38–126)
Anion gap: 6 (ref 5–15)
BUN: 6 mg/dL (ref 6–20)
CO2: 25 mmol/L (ref 22–32)
Calcium: 7.5 mg/dL — ABNORMAL LOW (ref 8.9–10.3)
Chloride: 100 mmol/L (ref 98–111)
Creatinine, Ser: 0.36 mg/dL — ABNORMAL LOW (ref 0.44–1.00)
GFR, Estimated: >60 mL/min (ref >60)
Glucose, Bld: 111 mg/dL — ABNORMAL HIGH (ref 70–99)
Potassium: 3.4 mmol/L — ABNORMAL LOW (ref 3.5–5.1)
Sodium: 131 mmol/L — ABNORMAL LOW (ref 135–145)
Total Bilirubin: 3.6 mg/dL — ABNORMAL HIGH (ref 0.3–1.2)
Total Protein: 5.2 g/dL — ABNORMAL LOW (ref 6.5–8.1)

## 2021-04-29 LAB — PHOSPHORUS: Phosphorus: 2.2 mg/dL — ABNORMAL LOW (ref 2.5–4.6)

## 2021-04-29 LAB — MAGNESIUM: Magnesium: 1.5 mg/dL — ABNORMAL LOW (ref 1.7–2.4)

## 2021-04-29 MED ORDER — SALINE SPRAY 0.65 % NA SOLN
1.0000 | NASAL | Status: DC | PRN
  Filled 2021-04-29: qty 44

## 2021-04-29 MED ORDER — POTASSIUM CHLORIDE 20 MEQ PO PACK
40.0000 meq | PACK | Freq: Every day | ORAL | Status: DC
  Filled 2021-04-29 (×2): qty 2

## 2021-04-29 MED ORDER — DEXAMETHASONE 0.5 MG PO TABS
1.0000 mg | ORAL_TABLET | Freq: Every day | ORAL | Status: DC
  Administered 2021-04-29 – 2021-04-30 (×2): 1 mg via ORAL
  Filled 2021-04-29 (×3): qty 2

## 2021-04-29 MED ORDER — BIOTENE DRY MOUTH MT LIQD: 15.0000 mL | OROMUCOSAL | Status: DC | PRN

## 2021-04-29 MED ORDER — K PHOS MONO-SOD PHOS DI & MONO 155-852-130 MG PO TABS
250.0000 mg | ORAL_TABLET | Freq: Two times a day (BID) | ORAL | Status: AC
  Administered 2021-04-29 (×2): 250 mg via ORAL
  Filled 2021-04-29 (×2): qty 1

## 2021-04-29 MED ORDER — MIDODRINE HCL 2.5 MG PO TABS
2.5000 mg | ORAL_TABLET | Freq: Three times a day (TID) | ORAL | Status: DC
  Administered 2021-04-30 (×3): 2.5 mg via ORAL
  Filled 2021-04-29 (×7): qty 1

## 2021-04-29 NOTE — Progress Notes (Signed)
PROGRESS NOTE    MARYMARGARET KIRKER  ZYS:[id number] DOB: 06-Oct-1975 DOA: 04/22/2021 PCP: [name], NP    No chief complaint on file.   Brief Narrative:  MARGARETANN ABATE is a 46 y.o. female with past medical history of metastatic breast cancer to the liver received first cycle of Abraxane and pembrolizumab treatment on 04/15/2021, was sent from oncology office with complaints of sore throat, fatigue weakness ,fever, tachycardia and hypotension, found to have neutropenic fever   received GCSF on 1/26   Subjective:  Feeling sob laying down, sitting up feel better, Dry mouth but mouth sore is better Denies pain  She remains very weak, she is currently on room air at rest   Assessment & Plan:   Principal Problem:   Neutropenic fever ([place]) Active Problems:   Malignant neoplasm of upper-outer quadrant of left breast in female, estrogen receptor positive ([place])   Liver metastases ([place])   Pressure injury of skin  Neutropenic fever/oral ulceration -Presented with severe neutropenia, received G-CSF on 1/26, now with leukocytosis -She received cefepime and from 1 26-1 29 -Finished Diflucan due to oral ulcer, last dose on 2/2 -Blood culture on presentation no growth,  - repeat blood culture from 2/1 no growth so far , UA from 2/1 no bacteria, pleural fluid culture no growth, Gram stain no organisms seen -Repeat mild sore has improved, tolerating regular diet  Sinus tachycardia/hypotension -CTA no PE, does show increased large right pleural effusion with complete collapse of right lower lobe and right middle lobe, new groundglass opacities of left upper lobe and left lower lobe concerning for infection or inflammatory process such at drug reaction -A.m. cortisol level unremarkable -Echocardiogram lvef wnl -TSH unremarkable -UA no bacteria and blood culture no growth -Start midodrine for blood pressure support  Case discussed with hematology oncology Dr. Lindi Adie, plan to start  low-dose Decadron due to CTA chest obtained on 1/31 concern for drug-induced left side inflammatory lung changes  Hyponatremia Hypokalemia Hypomagnesemia Hypophosphatemia Remain low continue to replace, recheck in the morning  Normocytic anemia in the setting of malignancy  Metastatic breast cancer Elevated LFT with known liver mets there is biliary ductal dilatation Cancer pain  FTT, poor prognosis, palliative care consulted     Body mass index is 32.1 kg/m.Marland Kitchen Seen by dietician.  I agree with the assessment and plan as outlined below: Nutrition Status: Nutrition Problem: Increased nutrient needs Etiology: cancer and cancer related treatments Signs/Symptoms: estimated needs Interventions: Ensure Enlive (each supplement provides 350kcal and 20 grams of protein), MVI  .     Skin Assessment: I have examined the patients skin and I agree with the wound assessment as performed by the wound care RN as outlined below:  Pressure Injury 04/22/21 Sacrum Mid Stage 2 -  Partial thickness loss of dermis presenting as a shallow open injury with a red, pink wound bed without slough. (Active)  04/22/21 1700  Location: Sacrum  Location Orientation: Mid  Staging: Stage 2 -  Partial thickness loss of dermis presenting as a shallow open injury with a red, pink wound bed without slough.  Wound Description (Comments):   Present on Admission: Yes    Unresulted Labs (From admission, onward)     Start     Ordered   04/28/21 1438  Miscellaneous [facility] test (send-out)  RELEASE UPON ORDERING,   TIMED        04/28/21 1438   04/28/21 1438  Body fluid culture w Gram Stain  RELEASE UPON ORDERING,  TIMED        04/28/21 1438   04/28/21 1201  Culture, blood (routine x 2)  BLOOD CULTURE X 2,   R (with TIMED occurrences)      04/28/21 1200   04/26/21 0500  CBC with Differential/Platelet  Daily,   R     Question:  Specimen collection method  Answer:  Unit=Unit collect   04/25/21 0917   04/26/21  0500  Comprehensive metabolic panel  Daily,   R     Question:  Specimen collection method  Answer:  Unit=Unit collect   04/25/21 0917   04/26/21 0500  Magnesium  Daily,   R     Question:  Specimen collection method  Answer:  Unit=Unit collect   04/25/21 0917              DVT prophylaxis: enoxaparin (LOVENOX) injection 40 mg Start: 04/22/21 2200 SCDs Start: 04/22/21 1716   Code Status: Full Family Communication: Patient Disposition:   Status is: Inpatient  Dispo: The patient is from: Home              Anticipated d/c is to: Home with home health, outpatient palliative care              Anticipated d/c date is: Possible tomorrow               Consultants:  Oncology Dr. Langston Masker Palliative care  Procedures:  Right thoracentesis  Antimicrobials:    Anti-infectives (From admission, onward)    Start     Dose/Rate Route Frequency Ordered Stop   04/24/21 1400  fluconazole (DIFLUCAN) tablet 100 mg        100 mg Oral Daily 04/24/21 1057     04/22/21 2000  fluconazole (DIFLUCAN) IVPB 100 mg  Status:  Discontinued        100 mg 50 mL/hr over 60 Minutes Intravenous Every 24 hours 04/22/21 1724 04/24/21 1057   04/22/21 1800  ceFEPIme (MAXIPIME) 2 g in sodium chloride 0.9 % 100 mL IVPB  Status:  Discontinued        2 g 200 mL/hr over 30 Minutes Intravenous Every 8 hours 04/22/21 1716 04/25/21 0741          Objective: Vitals:   04/28/21 1954 04/28/21 2104 04/28/21 2321 04/29/21 0427  BP: 92/73 (!) 84/61 (!) 91/57 99/69  Pulse: (!) 132 (!) 134 (!) 141 (!) 133  Resp: 18  18 16   Temp: 98.2 F (36.8 C)  98.6 F (37 C)   TempSrc: Oral  Oral Oral  SpO2: 96%  93% 94%  Weight:      Height:        Intake/Output Summary (Last 24 hours) at 04/29/2021 0740 Last data filed at 04/28/2021 1700 Gross per 24 hour  Intake 3 ml  Output 300 ml  Net -297 ml   Filed Weights   04/24/21 0630 04/27/21 0403 04/28/21 0506  Weight: 81.9 kg 88.5 kg 82.2 kg    Examination:  General  exam: Very frail and weak, AAOx3 Respiratory system:  diminished on right side, Respiratory effort normal. Cardiovascular system:  sinus tachycardia Gastrointestinal system: Abdomen is nondistended, soft and nontender.  Normal bowel sounds heard. Central nervous system: Alert and oriented. No focal neurological deficits. Extremities:  mild dependent pedal edema bialterally Skin: No rashes, lesions or ulcers Psychiatry: Judgement and insight appear normal. Mood & affect appropriate.     Data Reviewed: I have personally reviewed following labs and imaging studies  CBC: Recent Labs  Lab 04/25/21 0500 04/26/21 0533 04/27/21 0434 04/28/21 0454 04/29/21 0500  WBC 12.4* 22.1* 25.0* 20.8* 17.8*  NEUTROABS 9.4* 18.1* 18.9* 15.6* 13.5*  HGB 9.7* 9.6* 9.4* 9.1* 9.1*  HCT 29.3* 28.9* 28.6* 27.9* 27.9*  MCV 89.1 88.7 89.9 91.5 92.1  PLT 162 149* 144* 188 431    Basic Metabolic Panel: Recent Labs  Lab 04/25/21 0500 04/25/21 2114 04/26/21 0533 04/27/21 0434 04/28/21 0454 04/29/21 0500  NA 130* 130* 130* 131* 130* 131*  K 3.0* 3.2* 3.3* 3.3* 3.5 3.4*  CL 101 100 100 101 99 100  CO2 24 25 26 26 25 25   GLUCOSE 112* 96 178* 109* 107* 111*  BUN 7 6 6  5* 5* 6  CREATININE <0.30* <0.30* <0.30* 0.37* 0.36* 0.36*  CALCIUM 6.9* 6.9* 7.0* 7.3* 7.7* 7.5*  MG 1.9 1.7 2.1 1.7 1.5* 1.5*  PHOS <1.0*  --  <1.0* 1.3*  --  2.2*    GFR: Estimated Creatinine Clearance: 89.2 mL/min (A) (by C-G formula based on SCr of 0.36 mg/dL (L)).  Liver Function Tests: Recent Labs  Lab 04/25/21 0500 04/26/21 0533 04/27/21 0434 04/28/21 0454 04/29/21 0500  AST 172* 164* 169* 168* 196*  ALT 127* 138* 148* 150* 172*  ALKPHOS 387* 375* 379* 395* 370*  BILITOT 5.7* 5.0* 4.3* 4.3* 3.6*  PROT 4.9* 4.8* 5.1* 5.1* 5.2*  ALBUMIN <1.5* <1.5* <1.5* <1.5* <1.5*    CBG: No results for input(s): GLUCAP in the last 168 hours.   Recent Results (from the past 240 hour(s))  Culture, blood (Routine X 2) w Reflex  to ID Panel     Status: None   Collection Time: 04/22/21  5:36 PM   Specimen: Porta Cath; Blood  Result Value Ref Range Status   Specimen Description   Final    PORTA CATH Performed at [facility], [place] [address] [place], [place] [address]    Special Requests   Final    BOTTLES DRAWN AEROBIC AND ANAEROBIC Blood Culture adequate volume Performed at [place] [address] [place], [place] [address]    Culture   Final    NO GROWTH 5 DAYS Performed at [facility], [place] [address] [place], [place] [address]    Report Status 04/27/2021 FINAL  Final  Culture, blood (Routine X 2) w Reflex to ID Panel     Status: None   Collection Time: 04/22/21  5:37 PM   Specimen: Right Antecubital; Blood  Result Value Ref Range Status   Specimen Description   Final    RIGHT ANTECUBITAL Performed at [facility] [address], [place], [place] [address]    Special Requests   Final    BOTTLES DRAWN AEROBIC ONLY Blood Culture adequate volume Performed at [place] [address] [place], [place] [address]    Culture   Final    NO GROWTH 5 DAYS Performed at [facility], [place] [address], [place], [place] [address]    Report Status 04/27/2021 FINAL  Final  Resp Panel by RT-PCR (Flu A&B, Covid) Nasopharyngeal Swab     Status: None   Collection Time: 04/22/21  6:04 PM   Specimen: Nasopharyngeal Swab; Nasopharyngeal(NP) swabs in vial transport medium  Result Value Ref Range Status   SARS Coronavirus 2 by RT PCR NEGATIVE NEGATIVE Final    Comment: (NOTE) SARS-CoV-2 target nucleic acids are NOT DETECTED.  The SARS-CoV-2 RNA is generally detectable in upper respiratory specimens during  the acute phase of infection. The lowest concentration of SARS-CoV-2 viral copies this assay can detect is 138 copies/mL. A negative result does not preclude SARS-Cov-2 infection and should not be used as the sole basis  for treatment or other patient management decisions. A negative result may occur with  improper specimen collection/handling, submission of specimen other than nasopharyngeal swab, presence of viral mutation(s) within the areas targeted by this assay, and inadequate number of viral copies(<138 copies/mL). A negative result must be combined with clinical observations, patient history, and epidemiological information. The expected result is Negative.  Fact Sheet for Patients:  EntrepreneurPulse.com.au  Fact Sheet for Healthcare Providers:  IncredibleEmployment.be  This test is no t yet approved or cleared by the [place] FDA and  has been authorized for detection and/or diagnosis of SARS-CoV-2 by FDA under an Emergency Use Authorization (EUA). This EUA will remain  in effect (meaning this test can be used) for the duration of the COVID-19 declaration under Section 564(b)(1) of the Act, 21 U.S.C.section 360bbb-3(b)(1), unless the authorization is terminated  or revoked sooner.       Influenza A by PCR NEGATIVE NEGATIVE Final   Influenza B by PCR NEGATIVE NEGATIVE Final    Comment: (NOTE) The Xpert Xpress SARS-CoV-2/FLU/RSV plus assay is intended as an aid in the diagnosis of influenza from Nasopharyngeal swab specimens and should not be used as a sole basis for treatment. Nasal washings and aspirates are unacceptable for Xpert Xpress SARS-CoV-2/FLU/RSV testing.  Fact Sheet for Patients: EntrepreneurPulse.com.au  Fact Sheet for Healthcare Providers: IncredibleEmployment.be  This test is not yet approved or cleared by the [place] FDA and has been authorized for detection and/or diagnosis of SARS-CoV-2 by FDA under an Emergency Use Authorization (EUA). This EUA will remain in effect (meaning this test can be used) for the duration of the COVID-19 declaration under Section 564(b)(1) of the Act, 21  U.S.C. section 360bbb-3(b)(1), unless the authorization is terminated or revoked.  Performed at [facility], [place] [address], [place], [place] [address]   MRSA Next Gen by PCR, Nasal     Status: None   Collection Time: 04/22/21  6:25 PM   Specimen: Nasal Mucosa; Nasal Swab  Result Value Ref Range Status   MRSA by PCR Next Gen NOT DETECTED NOT DETECTED Final    Comment: (NOTE) The GeneXpert MRSA Assay (FDA approved for NASAL specimens only), is one component of a comprehensive MRSA colonization surveillance program. It is not intended to diagnose MRSA infection nor to guide or monitor treatment for MRSA infections. Test performance is not FDA approved in patients less than 38 years old. Performed at [facility], [place] [address] [place], [place] [address]   Urine Culture     Status: Abnormal   Collection Time: 04/23/21  8:03 AM   Specimen: Urine, Clean Catch  Result Value Ref Range Status   Specimen Description   Final    URINE, CLEAN CATCH Performed at [facility], [place] [address] [place], [place] [address]    Special Requests   Final    NONE Performed at [facility], [place] [address] [place], [place] [address]    Culture (A)  Final    <10,000 COLONIES/mL INSIGNIFICANT GROWTH Performed at [place] [address] [place],  [address]    Report Status 04/24/2021 FINAL  Final         Radiology Studies: CT Angio Chest Pulmonary Embolism (PE) W or  WO Contrast  Result Date: 04/27/2021 CLINICAL DATA:  Shortness of breath EXAM: CT ANGIOGRAPHY CHEST WITH CONTRAST TECHNIQUE: Multidetector CT imaging of the chest was performed using the standard protocol during bolus administration of intravenous contrast. Multiplanar CT image reconstructions and MIPs were obtained to evaluate the vascular anatomy. RADIATION DOSE REDUCTION: This exam was performed according to the departmental  dose-optimization program which includes automated exposure control, adjustment of the mA and/or kV according to patient size and/or use of iterative reconstruction technique. CONTRAST:  37mL OMNIPAQUE IOHEXOL 350 MG/ML SOLN COMPARISON:  Chest CT dated April 05, 2021 FINDINGS: Cardiovascular: Adequate contrast opacification of the pulmonary arteries. Limited evaluation of the segmental and subsegmental pulmonary arteries due to motion artifact and streak artifact. Normal heart size. No pericardial effusion. Normal caliber thoracic aorta. Mediastinum/Nodes: Enlarged left axillary, mediastinal, hilar, and internal mammary lymph nodes are decreased in size when compared with prior exam. Reference left axillary lymph node measuring 2.0 cm in short axis on series 5, image 67, previously measured 2.3 cm in short axis. Reference AP window lymph node measures 0.7 cm on image 94, previously measured 1.5 cm. Reference left internal mammary lymph node measures 0.8 cm in short axis, previously 1.1 cm. Lungs/Pleura: Increased large right pleural effusion with complete collapse of the right lower lobe and right middle lobe. New small left pleural effusion. New ground-glass opacities of the left upper lobe and left lower lobe. Upper Abdomen: Similar widespread metastatic disease seen of the liver with dominant right hepatic lobe mass and unchanged biliary ductal dilation. Musculoskeletal: No aggressive appearing osseous lesions. Review of the MIP images confirms the above findings. IMPRESSION: 1. No evidence of central pulmonary embolus, evaluation of the more distal pulmonary arteries is limited due to artifact. 2. Increased large right pleural effusion with complete collapse of the right lower lobe and right middle lobe. New small left pleural effusion. 3. New ground-glass opacities of the left upper lobe and left lower lobe, concerning for infection or inflammatory process such as drug reaction. 4. Enlarged left axillary,  mediastinal, hilar, and internal mammary lymph nodes are decreased in size when compared with prior exam. 5. Similar widespread hepatic metastatic disease and biliary ductal dilation. Electronically Signed   By: Yetta Glassman M.D.   On: 04/27/2021 14:19   DG CHEST PORT 1 VIEW  Result Date: 04/28/2021 CLINICAL DATA:  Status post thoracentesis EXAM: PORTABLE CHEST 1 VIEW COMPARISON:  04/27/2021 FINDINGS: Interval reduction in volume of a layering right pleural effusion. No significant pneumothorax. Probable small, layering left pleural effusion and some heterogeneous airspace opacities of the left lung. Right chest port catheter. Heart and mediastinum are unremarkable. IMPRESSION: 1. Interval reduction in volume of a layering right pleural effusion. No significant pneumothorax. 2. Probable small, layering left pleural effusion and some heterogeneous airspace opacities of the left lung. Electronically Signed   By: Delanna Ahmadi M.D.   On: 04/28/2021 14:56   DG Chest Port 1 View  Result Date: 04/27/2021 CLINICAL DATA:  Dyspnea EXAM: PORTABLE CHEST 1 VIEW COMPARISON:  04/22/2021 FINDINGS: Bilateral interstitial thickening. Patchy left perihilar airspace disease. Moderate right and small left pleural effusion. No pneumothorax. Stable cardiomediastinal silhouette. Right-sided Port-A-Cath in satisfactory position. No acute osseous abnormality. IMPRESSION: Moderate right and small left pleural effusion. Patchy left perihilar airspace disease concerning for pneumonia. Electronically Signed   By: Kathreen Devoid M.D.   On: 04/27/2021 09:00   ECHOCARDIOGRAM COMPLETE  Result Date: 04/28/2021    ECHOCARDIOGRAM REPORT   Patient Name:  Gloriajean Dell Pitcock Date of Exam: 04/28/2021 Medical Rec #:  [id number]      Height:       63.0 in Accession #:    [id number]     Weight:       181.2 lb Date of Birth:  [birth year]      BSA:          1.854 m Patient Age:    44 years       BP:           84/61 mmHg Patient Gender: F              HR:            115 bpm. Exam Location:  Inpatient Procedure: 2D Echo, Cardiac Doppler, Color Doppler and Intracardiac            Opacification Agent Indications:    R06.02 SOB  History:        Patient has prior history of Echocardiogram examinations, most                 recent 01/29/2019. Signs/Symptoms:Dyspnea and Fever. Metastatic                 cancer.  Sonographer:    Roseanna Rainbow RDCS Referring Phys: [id number] [place]  Sonographer Comments: Technically difficult study due to poor echo windows. IMPRESSIONS  1. Left ventricular ejection fraction, by estimation, is 60 to 65%. The left ventricle has normal function. The left ventricle has no regional wall motion abnormalities. Left ventricular diastolic parameters were normal.  2. Right ventricular systolic function was not well visualized. The right ventricular size is not well visualized. There is mildly elevated pulmonary artery systolic pressure.  3. The mitral valve is normal in structure. No evidence of mitral valve regurgitation. No evidence of mitral stenosis.  4. Tricuspid valve regurgitation is mild to moderate.  5. The aortic valve has an indeterminant number of cusps. Aortic valve regurgitation is not visualized. Aortic valve sclerosis/calcification is present, without any evidence of aortic stenosis.  6. The inferior vena cava is normal in size with greater than 50% respiratory variability, suggesting right atrial pressure of 3 mmHg. FINDINGS  Left Ventricle: Left ventricular ejection fraction, by estimation, is 60 to 65%. The left ventricle has normal function. The left ventricle has no regional wall motion abnormalities. Definity contrast agent was given IV to delineate the left ventricular  endocardial borders. The left ventricular internal cavity size was normal in size. There is no left ventricular hypertrophy. Left ventricular diastolic parameters were normal. Right Ventricle: The right ventricular size is not well visualized. No increase in right  ventricular wall thickness. Right ventricular systolic function was not well visualized. There is mildly elevated pulmonary artery systolic pressure. The tricuspid regurgitant velocity is 2.77 m/s, and with an assumed right atrial pressure of 8 mmHg, the estimated right ventricular systolic pressure is 81.4 mmHg. Left Atrium: Left atrial size was normal in size. Right Atrium: Right atrial size was normal in size. Pericardium: There is no evidence of pericardial effusion. Mitral Valve: The mitral valve is normal in structure. No evidence of mitral valve regurgitation. No evidence of mitral valve stenosis. Tricuspid Valve: The tricuspid valve is not well visualized. Tricuspid valve regurgitation is mild to moderate. No evidence of tricuspid stenosis. Aortic Valve: The aortic valve has an indeterminant number of cusps. Aortic valve regurgitation is not visualized. Aortic valve sclerosis/calcification is present, without any evidence of aortic stenosis. Pulmonic Valve: The pulmonic  valve was not well visualized. Pulmonic valve regurgitation is not visualized. No evidence of pulmonic stenosis. Aorta: The aortic root is normal in size and structure. Venous: The inferior vena cava is normal in size with greater than 50% respiratory variability, suggesting right atrial pressure of 3 mmHg. IAS/Shunts: No atrial level shunt detected by color flow Doppler.  LEFT VENTRICLE PLAX 2D LVIDd:         2.90 cm     Diastology LVIDs:         2.10 cm     LV e' medial:    10.00 cm/s LV PW:         1.00 cm     LV E/e' medial:  7.2 LV IVS:        0.80 cm     LV e' lateral:   10.30 cm/s LVOT diam:     2.00 cm     LV E/e' lateral: 7.0 LV SV:         36 LV SV Index:   19 LVOT Area:     3.14 cm  LV Volumes (MOD) LV vol d, MOD A2C: 27.6 ml LV vol d, MOD A4C: 25.2 ml LV vol s, MOD A2C: 9.1 ml LV vol s, MOD A4C: 11.5 ml LV SV MOD A2C:     18.5 ml LV SV MOD A4C:     25.2 ml LV SV MOD BP:      16.1 ml RIGHT VENTRICLE             IVC RV S prime:      12.50 cm/s  IVC diam: 1.00 cm TAPSE (M-mode): 1.3 cm LEFT ATRIUM             Index       RIGHT ATRIUM          Index LA diam:        2.70 cm 1.46 cm/m  RA Area:     7.94 cm LA Vol (A2C):   11.7 ml 6.31 ml/m  RA Volume:   14.80 ml 7.98 ml/m LA Vol (A4C):   13.5 ml 7.28 ml/m LA Biplane Vol: 13.0 ml 7.01 ml/m  AORTIC VALVE LVOT Vmax:   88.60 cm/s LVOT Vmean:  61.300 cm/s LVOT VTI:    0.114 m  AORTA Ao Root diam: 2.60 cm Ao Asc diam:  2.70 cm MITRAL VALVE               TRICUSPID VALVE MV Area (PHT): 5.66 cm    TR Peak grad:   30.7 mmHg MV Decel Time: 134 msec    TR Vmax:        277.00 cm/s MV E velocity: 71.70 cm/s MV A velocity: 82.60 cm/s  SHUNTS MV E/A ratio:  0.87        Systemic VTI:  0.11 m                            Systemic Diam: 2.00 cm Kardie Tobb DO Electronically signed by Berniece Salines DO Signature Date/Time: 04/28/2021/5:00:40 PM    Final    US THORACENTESIS ASP PLEURAL SPACE W/IMG GUIDE  Result Date: 04/28/2021 INDICATION: History of metastatic breast cancer. Shortness of breath, pleural effusion seen on previous chest x-ray. Request for therapeutic and diagnostic thoracentesis. EXAM: ULTRASOUND GUIDED RIGHT THORACENTESIS MEDICATIONS: 15 mL 1% lidocaine COMPLICATIONS: None immediate. PROCEDURE: An ultrasound guided thoracentesis was thoroughly discussed with the patient and questions answered. The benefits,  risks, alternatives and complications were also discussed. The patient understands and wishes to proceed with the procedure. Written consent was obtained. Ultrasound was performed to localize and mark an adequate pocket of fluid in the right chest. The area was then prepped and draped in the normal sterile fashion. 1% Lidocaine was used for local anesthesia. Under ultrasound guidance a 6 Fr Safe-T-Centesis catheter was introduced. Thoracentesis was performed. The catheter was removed and a dressing applied. FINDINGS: A total of approximately 1 L of hazy brown fluid was removed. Samples were sent  to the laboratory as requested by the clinical team. Post procedure chest X-ray reviewed, negative for pneumothorax. IMPRESSION: Successful ultrasound guided right thoracentesis yielding 1 L of pleural fluid. Read by: Durenda Guthrie, PA-C Electronically Signed   By: Jerilynn Mages.  Shick M.D.   On: 04/28/2021 15:49        Scheduled Meds:  Chlorhexidine Gluconate Cloth  6 each Topical Daily   enoxaparin (LOVENOX) injection  40 mg Subcutaneous Q24H   feeding supplement  237 mL Oral TID BM   fentaNYL  1 patch Transdermal Q72H   fluconazole  100 mg Oral Daily   magnesium oxide  400 mg Oral Daily   metoprolol tartrate  12.5 mg Oral BID   midodrine  2.5 mg Oral TID WC   multivitamin with minerals  1 tablet Oral Daily   pantoprazole  40 mg Oral BID   phosphorus  250 mg Oral BID   potassium chloride  40 mEq Oral Daily   sodium chloride flush  3 mL Intravenous Q12H   Continuous Infusions:  sodium chloride       LOS: 7 days    Greater than 50% of this time was spent in counseling, explanation of diagnosis, planning of further management, and coordination of care.   Voice Recognition Viviann Spare dictation system was used to create this note, attempts have been made to correct errors. Please contact the author with questions and/or clarifications.   Florencia Reasons, MD PhD FACP [facility]  Available via Epic secure chat 7am-7pm for nonurgent issues Please page for urgent issues To page the attending provider between 7A-7P or the covering provider during after hours 7P-7A, please log into the web site [email or web address] and access using universal [place] password for that web site. If you do not have the password, please call the hospital operator.    04/29/2021, 7:40 AM

## 2021-04-29 NOTE — TOC Progression Note (Signed)
Transition of Care [facility]) - Progression Note    Patient Details  Name: KRYSTYNE TEWKSBURY MRN: [id number] Date of Birth: 1975-05-24  Transition of [facility] CM/SW Contact  Eliseo Withers, Marjie Skiff, RN Phone Number: 04/29/2021, 2:41 PM  Clinical Narrative:    Spoke with pt at bedside for dc planning. Pt lives with her sister and 47 year old son. She is requesting HHPT/OT at dc. Choice offered and [facility] chosen. [facility] liaison given referral. Pt is also requesting a hospital bed and RW. Orders received for both. [employer] contacted for DME.    Barriers to Discharge: Continued Medical Work up  Expected Discharge Plan and Services Expected Discharge Plan: Home/Self Care   Discharge Planning Services: CM Consult   Living arrangements for the past 2 months: Single Family Home                     Readmission Risk Interventions Readmission Risk Prevention Plan 04/28/2021  Transportation Screening Complete  PCP or Specialist Appt within 3-5 Days Complete  HRI or [place] Complete  Social Work Consult for [place] Planning/Counseling Complete  Palliative Care Screening Complete  Medication Review Press photographer) Complete  Some recent data might be hidden

## 2021-04-29 NOTE — Progress Notes (Signed)
Nutrition Follow-up  INTERVENTION:   -Ensure Plus High Protein po TID, each supplement provides 350 kcal and 20 grams of protein.    -Multivitamin with minerals daily  NUTRITION DIAGNOSIS:   Increased nutrient needs related to cancer and cancer related treatments as evidenced by estimated needs.  Ongoing.  GOAL:   Patient will meet greater than or equal to 90% of their needs  Progressing.  MONITOR:   PO intake, Supplement acceptance, Labs, Weight trends, Skin, I & O's  REASON FOR ASSESSMENT:   Consult Assessment of nutrition requirement/status  ASSESSMENT:   46 y.o. female with past medical history of breast cancer with metastasis was sent from oncology office with complaints of sore throat, fatigue weakness and fever. Admitted for neutropenic fever.  2/1: s/p paracentesis, 1L yield  Patient only accepting ~1 Ensure daily.  Last PO documented 50-100% on 1/29. Pt only ordered lunch yesterday and has had breakfast this morning.  Admission weight: 167 lbs. Current weight: 181 lbs.  I/Os: +5.4L since admit UOP: 300 ml x 24 hrs  Medications: MAG-OX, Multivitamin with minerals daily, K-Phos, KLOR-CON  Labs reviewed: Low Na, K, Mg , Phos  Diet Order:   Diet Order             Diet regular Room service appropriate? Yes; Fluid consistency: Thin  Diet effective now                   EDUCATION NEEDS:   No education needs have been identified at this time  Skin:  Skin Assessment: Skin Integrity Issues: Skin Integrity Issues:: Stage II, Incisions Stage II: mid sacrum Incisions: 1/18: rt chest  Last BM:  2/1 -type 7  Height:   Ht Readings from Last 1 Encounters:  04/25/21 5\' 3"  (1.6 m)    Weight:   Wt Readings from Last 1 Encounters:  04/28/21 82.2 kg    BMI:  Body mass index is 32.1 kg/m.  Estimated Nutritional Needs:   Kcal:  1900-2100  Protein:  95-105g  Fluid:  2.1L/day  Paige Bibles, MS, RD, LDN Inpatient Clinical  Dietitian Contact information available via Amion

## 2021-04-29 NOTE — Progress Notes (Signed)
°  °  Durable Medical Equipment  (From admission, onward)           Start     Ordered   04/29/21 1414  For home use only DME Walker rolling  Once       Question Answer Comment  Walker: With [place] Wheels   Patient needs a walker to treat with the following condition Weakness      04/29/21 1413   04/29/21 1412  For home use only DME Hospital bed  Once       Question Answer Comment  Length of Need Lifetime   Patient has (list medical condition): Metastatic Cancer   The above medical condition requires: Patient requires the ability to reposition frequently   Head must be elevated greater than: 30 degrees   Bed type Semi-electric      04/29/21 1413

## 2021-04-29 NOTE — Assessment & Plan Note (Signed)
oral ulceration -Presented with severe neutropenia, received G-CSF on 1/26, now with leukocytosis -She received cefepime and from 1 26-1 29 -Continue day 7 of Diflucan due to oral ulcer -Blood culture on presentation no growth, due to persistent fever, will repeat blood culture, check UA, pleural fluid study -Repeat mild sore has improved, want to advance diet to regular diet

## 2021-04-29 NOTE — Hospital Course (Signed)
Paige Perry is a 46 y.o. 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

## 2021-04-30 LAB — COMPREHENSIVE METABOLIC PANEL
ALT: 140 U/L — ABNORMAL HIGH (ref 0–44)
AST: 151 U/L — ABNORMAL HIGH (ref 15–41)
Albumin: <1.5 g/dL — ABNORMAL LOW (ref 3.5–5.0)
Alkaline Phosphatase: 328 U/L — ABNORMAL HIGH (ref 38–126)
Anion gap: 6 (ref 5–15)
BUN: 7 mg/dL (ref 6–20)
CO2: 26 mmol/L (ref 22–32)
Calcium: 7.6 mg/dL — ABNORMAL LOW (ref 8.9–10.3)
Chloride: 99 mmol/L (ref 98–111)
Creatinine, Ser: 0.46 mg/dL (ref 0.44–1.00)
GFR, Estimated: >60 mL/min (ref >60)
Glucose, Bld: 138 mg/dL — ABNORMAL HIGH (ref 70–99)
Potassium: 3.3 mmol/L — ABNORMAL LOW (ref 3.5–5.1)
Sodium: 131 mmol/L — ABNORMAL LOW (ref 135–145)
Total Bilirubin: 2.9 mg/dL — ABNORMAL HIGH (ref 0.3–1.2)
Total Protein: 4.8 g/dL — ABNORMAL LOW (ref 6.5–8.1)

## 2021-04-30 LAB — CBC WITH DIFFERENTIAL/PLATELET
Abs Immature Granulocytes: 1.04 10*3/uL — ABNORMAL HIGH (ref 0.00–0.07)
Basophils Absolute: 0.1 10*3/uL (ref 0.0–0.1)
Basophils Relative: 1 %
Eosinophils Absolute: 0 10*3/uL (ref 0.0–0.5)
Eosinophils Relative: 0 %
HCT: 23.6 % — ABNORMAL LOW (ref 36.0–46.0)
Hemoglobin: 7.6 g/dL — ABNORMAL LOW (ref 12.0–15.0)
Immature Granulocytes: 7 %
Lymphocytes Relative: 9 %
Lymphs Abs: 1.4 10*3/uL (ref 0.7–4.0)
MCH: 29.8 pg (ref 26.0–34.0)
MCHC: 32.2 g/dL (ref 30.0–36.0)
MCV: 92.5 fL (ref 80.0–100.0)
Monocytes Absolute: 0.9 10*3/uL (ref 0.1–1.0)
Monocytes Relative: 6 %
Neutro Abs: 11.4 10*3/uL — ABNORMAL HIGH (ref 1.7–7.7)
Neutrophils Relative %: 77 %
Platelets: 245 10*3/uL (ref 150–400)
RBC: 2.55 MIL/uL — ABNORMAL LOW (ref 3.87–5.11)
RDW: 17.1 % — ABNORMAL HIGH (ref 11.5–15.5)
WBC: 14.8 10*3/uL — ABNORMAL HIGH (ref 4.0–10.5)
nRBC: 1.3 % — ABNORMAL HIGH (ref 0.0–0.2)

## 2021-04-30 LAB — PREPARE RBC (CROSSMATCH)

## 2021-04-30 LAB — MAGNESIUM: Magnesium: 1.5 mg/dL — ABNORMAL LOW (ref 1.7–2.4)

## 2021-04-30 MED ORDER — SODIUM CHLORIDE 0.9% IV SOLUTION: Freq: Once | INTRAVENOUS | Status: AC

## 2021-04-30 MED ORDER — POTASSIUM CHLORIDE CRYS ER 20 MEQ PO TBCR
40.0000 meq | EXTENDED_RELEASE_TABLET | ORAL | Status: AC
  Administered 2021-04-30 (×2): 40 meq via ORAL
  Filled 2021-04-30 (×2): qty 2

## 2021-04-30 MED ORDER — ADULT MULTIVITAMIN W/MINERALS CH: 1.0000 | ORAL_TABLET | Freq: Every day | ORAL | 0 refills | Status: AC

## 2021-04-30 MED ORDER — DEXAMETHASONE 1 MG PO TABS: 1.0000 mg | ORAL_TABLET | Freq: Every day | ORAL | 0 refills | Status: AC

## 2021-04-30 MED ORDER — BIOTENE DRY MOUTH MT LIQD: 15.0000 mL | OROMUCOSAL | 0 refills | Status: AC | PRN

## 2021-04-30 MED ORDER — ENSURE ENLIVE PO LIQD: 237.0000 mL | Freq: Three times a day (TID) | ORAL | 12 refills | Status: AC

## 2021-04-30 MED ORDER — SENNOSIDES-DOCUSATE SODIUM 8.6-50 MG PO TABS: 1.0000 | ORAL_TABLET | Freq: Two times a day (BID) | ORAL | Status: DC

## 2021-04-30 MED ORDER — POTASSIUM CHLORIDE CRYS ER 20 MEQ PO TBCR: 40.0000 meq | EXTENDED_RELEASE_TABLET | Freq: Every day | ORAL | 0 refills | Status: DC

## 2021-04-30 MED ORDER — POLYETHYLENE GLYCOL 3350 17 G PO PACK: 17.0000 g | PACK | Freq: Every day | ORAL | 0 refills | Status: AC

## 2021-04-30 MED ORDER — MIDODRINE HCL 2.5 MG PO TABS: 2.5000 mg | ORAL_TABLET | Freq: Two times a day (BID) | ORAL | 0 refills | Status: AC

## 2021-04-30 MED ORDER — PANTOPRAZOLE SODIUM 40 MG PO TBEC: 40.0000 mg | DELAYED_RELEASE_TABLET | Freq: Every day | ORAL | 0 refills | Status: DC

## 2021-04-30 MED ORDER — LIDOCAINE VISCOUS HCL 2 % MT SOLN: 15.0000 mL | OROMUCOSAL | 0 refills | Status: AC | PRN

## 2021-04-30 MED ORDER — POLYETHYLENE GLYCOL 3350 17 G PO PACK
17.0000 g | PACK | Freq: Every day | ORAL | Status: DC
  Administered 2021-04-30: 17 g via ORAL
  Filled 2021-04-30: qty 1

## 2021-04-30 MED ORDER — MAGNESIUM OXIDE -MG SUPPLEMENT 400 (240 MG) MG PO TABS
400.0000 mg | ORAL_TABLET | Freq: Every day | ORAL | Status: DC
  Administered 2021-04-30: 400 mg via ORAL
  Filled 2021-04-30: qty 1

## 2021-04-30 MED ORDER — SALINE SPRAY 0.65 % NA SOLN: 1.0000 | NASAL | 0 refills | Status: AC | PRN

## 2021-04-30 MED ORDER — MAGNESIUM OXIDE -MG SUPPLEMENT 400 (240 MG) MG PO TABS: 400.0000 mg | ORAL_TABLET | Freq: Every day | ORAL | 0 refills | Status: AC

## 2021-04-30 NOTE — Progress Notes (Signed)
PT Cancellation Note  Patient Details Name: Paige Perry MRN: [id number] DOB: Jul 29, 1975   Cancelled Treatment:    Reason Eval/Treat Not Completed: Patient declined, no reason specified;Other (comment) (receiving blood). Upon arrival to room, pt receiving blood. Pt reports she is going home today and doesn't need PT to check back on her before discharging.   Talbot Grumbling PT, DPT 04/30/21, 12:05 PM

## 2021-04-30 NOTE — Discharge Summary (Signed)
Discharge Summary  Paige Perry:[id number] DOB: 03-08-1976  PCP: [name], NP  Admit date: 04/22/2021 Discharge date: 04/30/2021  Time spent: 34mns, more than 50% time spent on coordination of care.   Recommendations for Outpatient Follow-up:  F/u with hematology/oncology Dr GLindi Adie   Pending test to follow up at hospital discharge: pleural fluids cytology and final culture result   2. Outpatient palliative care referral   Discharge Diagnoses:  Active Hospital Problems   Diagnosis Date Noted   Neutropenic fever oral ulceration 04/22/2021   Pressure injury of skin 04/23/2021   Liver metastases ([facility] 04/19/2021   Malignant neoplasm of upper-outer quadrant of left breast in female, estrogen receptor positive ([facility] 01/18/2019    Resolved Hospital Problems  No resolved problems to display.    Discharge Condition: stable  Diet recommendation: regular diet   Filed Weights   04/24/21 0630 04/27/21 0403 04/28/21 0506  Weight: 81.9 kg 88.5 kg 82.2 kg    History of present illness: ( per admitting MD Dr PLouanne Belton Chief Complaint:  fever   HPI: Paige RAYSORis a 46y.o. female with past medical history of breast cancer with metastasis was sent from oncology office with complaints of sore throat, fatigue weakness and fever.  Patient was treated with Magic mouthwash and Diflucan as outpatient but did not improve.  Patient complains of severe burning and pain whenever she tries to eat and drink something so she has not been eating much.  Patient however denies any nausea, vomiting or abdominal pain, diarrhea or constipation.  Patient did have some fever but denies rigor.  Denies any headache, visual blurring.  Denies any pain over the Port-A-Cath area.  Denies any new skin rash but has some soreness on her back.  Patient denies any recent travel or sick contacts.  Denies any syncope but has extreme fatigue and weakness.  Complains of vaginal dryness and occasional burning  sensation when ever she urinates due to dryness.   ED Course: In the ED, patient was noted to have a fever with mild tachycardia.  Labs done in the outpatient clinic today showed a WBC count of 0.7 with hemoglobin of 7.6 and platelet of 196.  Magnesium of 1.9.  Normal level was low at 126, potassium of 2.9 and chloride of 90.  Creatinine at 0.4.  AST and ALT were elevated at 150 and 146 respectively.  Alkaline phos was elevated at 913 and total bilirubin 13.5.  Patient was then considered for admission to hospital for neutropenic fever.    Hospital Course:  Principal Problem:   Neutropenic fever oral ulceration Active Problems:   Malignant neoplasm of upper-outer quadrant of left breast in female, estrogen receptor positive ([facility]   Liver metastases ([facility])   Pressure injury of skin  Neutropenic fever/oral ulceration -Presented with severe neutropenia, received G-CSF on 1/26, now with leukocytosis -She received cefepime and from 1 26-1 29 -Finished Diflucan due to oral ulcer, last dose on 2/2 -Blood culture on presentation no growth,  - repeat blood culture from 2/1 no growth so far , UA from 2/1 no bacteria, pleural fluid culture no growth so far, Gram stain no organisms seen - oral ulceration has resolved, tolerating regular diet   Sinus tachycardia/hypotension -CTA no PE, does show increased large right pleural effusion with complete collapse of right lower lobe and right middle lobe, new groundglass opacities of left upper lobe and left lower lobe concerning for infection or inflammatory process such at drug reaction -A.m. cortisol  level unremarkable -Echocardiogram lvef wnl -TSH unremarkable -UA no bacteria and blood culture no growth -Start midodrine for blood pressure support -received prbc transfusion , hopefully can help tachycardia and hyptension   Case discussed with hematology oncology Dr. Lindi Adie, plan to start low-dose Decadron due to CTA chest obtained on 1/31 concern for  drug-induced left side inflammatory lung changes   Hyponatremia Hypokalemia Hypomagnesemia Hypophosphatemia Replaced and improved   Normocytic anemia in the setting of malignancy Prbc transfusion x1 on 2/3   Metastatic breast cancer Elevated LFT with known liver mets there is biliary ductal dilatation Cancer pain: continue fentanyl patch   FTT, poor prognosis, palliative care consulted       Body mass index is 32.1 kg/m.Marland Kitchen Seen by dietician.  I agree with the assessment and plan as outlined below: Nutrition Status: Nutrition Problem: Increased nutrient needs Etiology: cancer and cancer related treatments Signs/Symptoms: estimated needs Interventions: Ensure Enlive (each supplement provides 350kcal and 20 grams of protein), MVI   .      Skin Assessment: I have examined the patients skin and I agree with the wound assessment as performed by the wound care RN as outlined below:   Pressure Injury 04/22/21 Sacrum Mid Stage 2 -  Partial thickness loss of dermis presenting as a shallow open injury with a red, pink wound bed without slough. (Active)  04/22/21 1700  Location: Sacrum  Location Orientation: Mid  Staging: Stage 2 -  Partial thickness loss of dermis presenting as a shallow open injury with a red, pink wound bed without slough.  Wound Description (Comments):   Present on Admission: Yes     Consultations: Case discussed with oncology Dr Lindi Adie Palliative care  Discharge Exam: BP 93/68    Pulse (!) 115    Temp 97.6 F (36.4 C) (Oral)    Resp 18    Ht 5' 3"  (1.6 m) Comment: per chart 6 days ago.   Wt 82.2 kg    SpO2 96%    BMI 32.10 kg/m   General: NAD, aaox3 Cardiovascular: sinus tachycardia, she denies symptom  Respiratory: normal respiratory effort, on room air    Discharge Instructions     Amb Referral to Palliative Care   Complete by: As directed    Diet general   Complete by: As directed    Discharge wound care:   Complete by: As directed     Pressure offloading   Increase activity slowly   Complete by: As directed       Allergies as of 04/30/2021       Reactions   Other    Corn: Unknown   Adhesive [tape] Rash   Cefepime Rash   Latex Rash   Lovenox [enoxaparin] Itching   Per patient when giving injection it causes her to itch   Tapentadol Rash        Medication List     STOP taking these medications    fluconazole 100 MG tablet Commonly known as: DIFLUCAN   nystatin 100000 UNIT/ML suspension Commonly known as: MYCOSTATIN   ondansetron 8 MG tablet Commonly known as: Zofran   Oxycodone HCl 10 MG Tabs   prochlorperazine 10 MG tablet Commonly known as: COMPAZINE       TAKE these medications    antiseptic oral rinse Liqd 15 mLs by Mouth Rinse route as needed for dry mouth.   dexamethasone 1 MG tablet Commonly known as: DECADRON Take 1 tablet (1 mg total) by mouth daily for 14 days. Start taking on:  May 01, 2021 What changed:  medication strength how much to take when to take this   feeding supplement Liqd Take 237 mLs by mouth 3 (three) times daily between meals.   fentaNYL 25 MCG/HR Commonly known as: [place] 1 patch onto the skin every 3 (three) days.   lidocaine 2 % solution Commonly known as: XYLOCAINE Use as directed 15 mLs in the mouth or throat every 3 (three) hours as needed for mouth pain.   lidocaine-prilocaine cream Commonly known as: EMLA Apply to affected area once What changed:  how much to take when to take this reasons to take this   magnesium oxide 400 (240 Mg) MG tablet Commonly known as: MAG-OX Take 1 tablet (400 mg total) by mouth daily.   midodrine 2.5 MG tablet Commonly known as: PROAMATINE Take 1 tablet (2.5 mg total) by mouth 2 (two) times daily with a meal for 7 days.   multivitamin with minerals Tabs tablet Take 1 tablet by mouth daily. Start taking on: May 01, 2021   pantoprazole 40 MG tablet Commonly known as: PROTONIX Take 1  tablet (40 mg total) by mouth daily.   polyethylene glycol 17 g packet Commonly known as: MIRALAX / GLYCOLAX Take 17 g by mouth daily.   potassium chloride SA 20 MEQ tablet Commonly known as: KLOR-CON M Take 2 tablets (40 mEq total) by mouth daily.   sodium chloride 0.65 % Soln nasal spray Commonly known as: OCEAN Place 1 spray into both nostrils as needed for congestion.               Durable Medical Equipment  (From admission, onward)           Start     Ordered   04/29/21 1414  For home use only DME Walker rolling  Once       Question Answer Comment  Walker: With [place] Wheels   Patient needs a walker to treat with the following condition Weakness      04/29/21 1413   04/29/21 1412  For home use only DME Hospital bed  Once       Question Answer Comment  Length of Need Lifetime   Patient has (list medical condition): Metastatic Cancer   The above medical condition requires: Patient requires the ability to reposition frequently   Head must be elevated greater than: 30 degrees   Bed type Semi-electric      04/29/21 1413              Discharge Care Instructions  (From admission, onward)           Start     Ordered   04/30/21 0000  Discharge wound care:       Comments: Pressure offloading   04/30/21 1140           Allergies  Allergen Reactions   Other     Corn: Unknown   Adhesive [Tape] Rash   Cefepime Rash   Latex Rash   Lovenox [Enoxaparin] Itching    Per patient when giving injection it causes her to itch   Tapentadol Rash    Follow-up Information     [facility] [place] Follow up.   Specialty: [place] Why: Home physical and occupational therapy Contact information: [address] [place] [id number] [phone]                  The results of significant diagnostics from this hospitalization (including imaging,  microbiology, ancillary and laboratory) are listed below for reference.     Significant Diagnostic Studies: CT Chest W Contrast  Result Date: 04/05/2021 CLINICAL DATA:  History of left breast cancer, completed therapy in 2021, now with biopsy-proven metastatic recurrence in the outer left breast and left axilla. Restaging. Dyspnea on exertion. EXAM: CT CHEST WITH CONTRAST TECHNIQUE: Multidetector CT imaging of the chest was performed during intravenous contrast administration. CONTRAST:  72m OMNIPAQUE IOHEXOL 350 MG/ML SOLN COMPARISON:  01/25/2021 chest CT angiogram. 03/22/2021 CT abdomen/pelvis. FINDINGS: Cardiovascular: Normal heart size. No significant pericardial effusion/thickening. Great vessels are normal in course and caliber. No central pulmonary emboli. Mediastinum/Nodes: No discrete thyroid nodules. Unremarkable esophagus. No right axillary adenopathy. Bilateral supraclavicular lymphadenopathy is new since 01/25/2021 chest CT, measuring 2.3 cm short axis diameter on the right (series 2/image 19) and 1.6 cm on the left (series 2/image 21). Multiple poorly marginated left axillary lymph nodes, largest 2.3 cm short axis diameter with internal biopsy clip (series 2/image 47), increased from 1.8 cm on 01/25/2021 using similar measurement technique. Infiltrative high left paratracheal 1.9 cm node (series 2/image 31), increased from 1.5 cm. Multiple enlarged prevascular nodes, largest 1.5 cm (series 2/image 40), increased from 1.3 cm. Newly enlarged 1.9 cm right hilar node (series 2/image 63). Enlarged 1.4 cm left hilar node (series 2/image 68), minimally increased from 1.3 cm. Newly enlarged bilateral retrocrural nodes, largest 1.1 cm on the right (series 2/image 112). Enlarged 1.3 cm left internal mammary node (series 2/image 41), increased from 1.0 cm. Lungs/Pleura: No pneumothorax. Small dependent right pleural effusion, new. No left pleural effusion. Near complete right lower lobe atelectasis and partial right middle lobe atelectasis is new. New tiny 0.3 cm solid anterior  right middle lobe nodule (series 5/image 89). No lung masses or additional significant pulmonary nodules. Upper abdomen: Small hiatal hernia. Infiltrative 11.7 x 9.1 cm posterior right liver mass (series 2/image 120), not substantially changed since 03/22/2021 CT abdomen study. Numerous additional hypodense liver masses scattered throughout the liver, increased since 03/22/2021 CT, for example measuring 1.7 cm in the peripheral right liver (series 2/image 97), increased from 0.9 cm. Musculoskeletal: No aggressive appearing focal osseous lesions. Multifocal left breast masses, only partially visualized on this CT study, several of which appear to involve the left pectoralis musculature, for example measuring 1.4 cm in the posterior left breast (series 2/image 88), increased from 0.7 cm on 10/31 chest CT, and 1.5 cm in the medial posterior left breast (series 2/image 97), increased from 0.9 cm. IMPRESSION: 1. Significant interval progression of metastatic disease in the bilateral supraclavicular, left axillary, mediastinal, bilateral hilar, bilateral retrocrural and left internal mammary chains. 2. Multifocal left breast masses, only partially visualized on this CT study, several of which appear to involve the left pectoralis musculature, increased. 3. New small dependent right pleural effusion. Near complete right lower lobe and partial right middle lobe atelectasis. 4. New tiny 0.3 cm right middle lobe pulmonary nodule, indeterminate, cannot exclude pulmonary metastasis. 5. Progressive multifocal liver metastases. 6. Small hiatal hernia. Electronically Signed   By: JIlona SorrelM.D.   On: 04/05/2021 14:38   CT Angio Chest Pulmonary Embolism (PE) W or WO Contrast  Result Date: 04/27/2021 CLINICAL DATA:  Shortness of breath EXAM: CT ANGIOGRAPHY CHEST WITH CONTRAST TECHNIQUE: Multidetector CT imaging of the chest was performed using the standard protocol during bolus administration of intravenous contrast.  Multiplanar CT image reconstructions and MIPs were obtained to evaluate the vascular anatomy. RADIATION DOSE REDUCTION: This exam was performed  according to the departmental dose-optimization program which includes automated exposure control, adjustment of the mA and/or kV according to patient size and/or use of iterative reconstruction technique. CONTRAST:  26m OMNIPAQUE IOHEXOL 350 MG/ML SOLN COMPARISON:  Chest CT dated April 05, 2021 FINDINGS: Cardiovascular: Adequate contrast opacification of the pulmonary arteries. Limited evaluation of the segmental and subsegmental pulmonary arteries due to motion artifact and streak artifact. Normal heart size. No pericardial effusion. Normal caliber thoracic aorta. Mediastinum/Nodes: Enlarged left axillary, mediastinal, hilar, and internal mammary lymph nodes are decreased in size when compared with prior exam. Reference left axillary lymph node measuring 2.0 cm in short axis on series 5, image 67, previously measured 2.3 cm in short axis. Reference AP window lymph node measures 0.7 cm on image 94, previously measured 1.5 cm. Reference left internal mammary lymph node measures 0.8 cm in short axis, previously 1.1 cm. Lungs/Pleura: Increased large right pleural effusion with complete collapse of the right lower lobe and right middle lobe. New small left pleural effusion. New ground-glass opacities of the left upper lobe and left lower lobe. Upper Abdomen: Similar widespread metastatic disease seen of the liver with dominant right hepatic lobe mass and unchanged biliary ductal dilation. Musculoskeletal: No aggressive appearing osseous lesions. Review of the MIP images confirms the above findings. IMPRESSION: 1. No evidence of central pulmonary embolus, evaluation of the more distal pulmonary arteries is limited due to artifact. 2. Increased large right pleural effusion with complete collapse of the right lower lobe and right middle lobe. New small left pleural effusion. 3.  New ground-glass opacities of the left upper lobe and left lower lobe, concerning for infection or inflammatory process such as drug reaction. 4. Enlarged left axillary, mediastinal, hilar, and internal mammary lymph nodes are decreased in size when compared with prior exam. 5. Similar widespread hepatic metastatic disease and biliary ductal dilation. Electronically Signed   By: LYetta GlassmanM.D.   On: 04/27/2021 14:19   DG CHEST PORT 1 VIEW  Result Date: 04/28/2021 CLINICAL DATA:  Status post thoracentesis EXAM: PORTABLE CHEST 1 VIEW COMPARISON:  04/27/2021 FINDINGS: Interval reduction in volume of a layering right pleural effusion. No significant pneumothorax. Probable small, layering left pleural effusion and some heterogeneous airspace opacities of the left lung. Right chest port catheter. Heart and mediastinum are unremarkable. IMPRESSION: 1. Interval reduction in volume of a layering right pleural effusion. No significant pneumothorax. 2. Probable small, layering left pleural effusion and some heterogeneous airspace opacities of the left lung. Electronically Signed   By: ADelanna AhmadiM.D.   On: 04/28/2021 14:56   DG Chest Port 1 View  Result Date: 04/27/2021 CLINICAL DATA:  Dyspnea EXAM: PORTABLE CHEST 1 VIEW COMPARISON:  04/22/2021 FINDINGS: Bilateral interstitial thickening. Patchy left perihilar airspace disease. Moderate right and small left pleural effusion. No pneumothorax. Stable cardiomediastinal silhouette. Right-sided Port-A-Cath in satisfactory position. No acute osseous abnormality. IMPRESSION: Moderate right and small left pleural effusion. Patchy left perihilar airspace disease concerning for pneumonia. Electronically Signed   By: HKathreen DevoidM.D.   On: 04/27/2021 09:00   DG CHEST PORT 1 VIEW  Result Date: 04/22/2021 CLINICAL DATA:  Fever. EXAM: PORTABLE CHEST 1 VIEW COMPARISON:  Radiograph 04/14/2021, CT 04/05/2021 FINDINGS: Accessed right chest port in place with tip in the  SVC. Hazy opacity throughout the right hemithorax with right pleural effusion, tracking laterally. This partially obscures assessment of the right lung. Allowing for this, no focal consolidation to suggest pneumonia. Mild left basilar atelectasis. Stable heart size and  mediastinal contours. No pneumothorax. Left axillary surgical clips. IMPRESSION: Moderate right pleural effusion, similar in appearance to prior exam. Mild left basilar atelectasis. Electronically Signed   By: Keith Rake M.D.   On: 04/22/2021 18:18   DG CHEST PORT 1 VIEW  Result Date: 04/14/2021 CLINICAL DATA:  Post port placement in PACU. EXAM: PORTABLE CHEST 1 VIEW COMPARISON:  January 25, 2021 FINDINGS: Accessed right chest wall port with tip overlying the superior cavoatrial junction. No visible pneumothorax. Large right pleural effusion with adjacent compressive atelectasis. Left lung is clear. The heart size and mediastinal contours are within normal limits. Left axillary surgical clips. IMPRESSION: 1. Large right pleural effusion with adjacent compressive atelectasis. 2. No visible pneumothorax status post port placement. Electronically Signed   By: Dahlia Bailiff M.D.   On: 04/14/2021 09:49   DG C-Arm 1-60 Min-No Report  Result Date: 04/14/2021 Fluoroscopy was utilized by the requesting physician.  No radiographic interpretation.   ECHOCARDIOGRAM COMPLETE  Result Date: 04/28/2021    ECHOCARDIOGRAM REPORT   Patient Name:   MAXIE DEBOSE Date of Exam: 04/28/2021 Medical Rec #:  [id number]      Height:       63.0 in Accession #:    [id number]     Weight:       181.2 lb Date of Birth:  Jan 28, 1976      BSA:          1.854 m Patient Age:    74 years       BP:           84/61 mmHg Patient Gender: F              HR:           115 bpm. Exam Location:  Inpatient Procedure: 2D Echo, Cardiac Doppler, Color Doppler and Intracardiac            Opacification Agent Indications:    R06.02 SOB  History:        Patient has prior history of  Echocardiogram examinations, most                 recent 01/29/2019. Signs/Symptoms:Dyspnea and Fever. Metastatic                 cancer.  Sonographer:    Roseanna Rainbow RDCS Referring Phys: [id number] [place]  Sonographer Comments: Technically difficult study due to poor echo windows. IMPRESSIONS  1. Left ventricular ejection fraction, by estimation, is 60 to 65%. The left ventricle has normal function. The left ventricle has no regional wall motion abnormalities. Left ventricular diastolic parameters were normal.  2. Right ventricular systolic function was not well visualized. The right ventricular size is not well visualized. There is mildly elevated pulmonary artery systolic pressure.  3. The mitral valve is normal in structure. No evidence of mitral valve regurgitation. No evidence of mitral stenosis.  4. Tricuspid valve regurgitation is mild to moderate.  5. The aortic valve has an indeterminant number of cusps. Aortic valve regurgitation is not visualized. Aortic valve sclerosis/calcification is present, without any evidence of aortic stenosis.  6. The inferior vena cava is normal in size with greater than 50% respiratory variability, suggesting right atrial pressure of 3 mmHg. FINDINGS  Left Ventricle: Left ventricular ejection fraction, by estimation, is 60 to 65%. The left ventricle has normal function. The left ventricle has no regional wall motion abnormalities. Definity contrast agent was given IV to delineate the left ventricular  endocardial borders. The  left ventricular internal cavity size was normal in size. There is no left ventricular hypertrophy. Left ventricular diastolic parameters were normal. Right Ventricle: The right ventricular size is not well visualized. No increase in right ventricular wall thickness. Right ventricular systolic function was not well visualized. There is mildly elevated pulmonary artery systolic pressure. The tricuspid regurgitant velocity is 2.77 m/s, and with an assumed  right atrial pressure of 8 mmHg, the estimated right ventricular systolic pressure is 60.7 mmHg. Left Atrium: Left atrial size was normal in size. Right Atrium: Right atrial size was normal in size. Pericardium: There is no evidence of pericardial effusion. Mitral Valve: The mitral valve is normal in structure. No evidence of mitral valve regurgitation. No evidence of mitral valve stenosis. Tricuspid Valve: The tricuspid valve is not well visualized. Tricuspid valve regurgitation is mild to moderate. No evidence of tricuspid stenosis. Aortic Valve: The aortic valve has an indeterminant number of cusps. Aortic valve regurgitation is not visualized. Aortic valve sclerosis/calcification is present, without any evidence of aortic stenosis. Pulmonic Valve: The pulmonic valve was not well visualized. Pulmonic valve regurgitation is not visualized. No evidence of pulmonic stenosis. Aorta: The aortic root is normal in size and structure. Venous: The inferior vena cava is normal in size with greater than 50% respiratory variability, suggesting right atrial pressure of 3 mmHg. IAS/Shunts: No atrial level shunt detected by color flow Doppler.  LEFT VENTRICLE PLAX 2D LVIDd:         2.90 cm     Diastology LVIDs:         2.10 cm     LV e' medial:    10.00 cm/s LV PW:         1.00 cm     LV E/e' medial:  7.2 LV IVS:        0.80 cm     LV e' lateral:   10.30 cm/s LVOT diam:     2.00 cm     LV E/e' lateral: 7.0 LV SV:         36 LV SV Index:   19 LVOT Area:     3.14 cm  LV Volumes (MOD) LV vol d, MOD A2C: 27.6 ml LV vol d, MOD A4C: 25.2 ml LV vol s, MOD A2C: 9.1 ml LV vol s, MOD A4C: 11.5 ml LV SV MOD A2C:     18.5 ml LV SV MOD A4C:     25.2 ml LV SV MOD BP:      16.1 ml RIGHT VENTRICLE             IVC RV S prime:     12.50 cm/s  IVC diam: 1.00 cm TAPSE (M-mode): 1.3 cm LEFT ATRIUM             Index       RIGHT ATRIUM          Index LA diam:        2.70 cm 1.46 cm/m  RA Area:     7.94 cm LA Vol (A2C):   11.7 ml 6.31 ml/m  RA  Volume:   14.80 ml 7.98 ml/m LA Vol (A4C):   13.5 ml 7.28 ml/m LA Biplane Vol: 13.0 ml 7.01 ml/m  AORTIC VALVE LVOT Vmax:   88.60 cm/s LVOT Vmean:  61.300 cm/s LVOT VTI:    0.114 m  AORTA Ao Root diam: 2.60 cm Ao Asc diam:  2.70 cm MITRAL VALVE  TRICUSPID VALVE MV Area (PHT): 5.66 cm    TR Peak grad:   30.7 mmHg MV Decel Time: 134 msec    TR Vmax:        277.00 cm/s MV E velocity: 71.70 cm/s MV A velocity: 82.60 cm/s  SHUNTS MV E/A ratio:  0.87        Systemic VTI:  0.11 m                            Systemic Diam: 2.00 cm Kardie Tobb DO Electronically signed by Berniece Salines DO Signature Date/Time: 04/28/2021/5:00:40 PM    Final    [place] AXILLARY NODE CORE BIOPSY LEFT  Addendum Date: 04/02/2021   ADDENDUM REPORT: 04/02/2021 12:21 ADDENDUM: 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 This was found to be concordant by Dr. Peggye Fothergill. Pathology revealed INVASIVE DUCTAL CARCINOMA WITH NECROSIS, NO NODAL TISSUE IDENTIFIED of the LEFT axilla, (tribell clip). This was found to be concordant by Dr. Peggye Fothergill. Pathology results were discussed with the patient by telephone. The patient reported doing well after the biopsies with tenderness at the sites. Post biopsy instructions and care were reviewed and questions were answered. The patient was encouraged to call [facility] for any additional concerns. My direct phone number was provided. The patient has a diagnosis of metastatic LEFT breast cancer and should follow her outlined treatment plan. The patient is scheduled for a telephone visit with Dr. Nicholas Lose at [facility] on April 07, 2021. Dr. Lindi Adie; Wilber Bihari, NP; Bary Castilla and Iris Pert, Oncology RN Nurse Navigators, were notified of biopsy results via EPIC message on April 02, 2021. Pathology results reported by Terie Purser, RN on 04/02/2021. Electronically  Signed   By: Evangeline Dakin M.D.   On: 04/02/2021 12:21   Result Date: 04/02/2021 CLINICAL DATA:  46 year old with a personal history of malignant lumpectomy of the UPPER OUTER QUADRANT of the LEFT breast and targeted LEFT axillary node dissection in March, 2020 (after neoadjuvant chemotherapy), with LEFT axillary node dissection in April, 2021. She now has multiple masses throughout the LEFT breast, the largest in the outer breast at the 3 o'clock location measuring 4.6 cm. 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 The largest LEFT breast mass and a pathologic LEFT axillary lymph node are targeted for biopsy. EXAM: ULTRASOUND-GUIDED CORE NEEDLE BIOPSY OF THE LEFT BREAST ULTRASOUND-GUIDED CORE NEEDLE BIOPSY OF A LEFT AXILLARY NODE COMPARISON:  Previous exam(s). FINDINGS: I met with the patient and we discussed the procedure of ultrasound-guided biopsy, including benefits and alternatives. We discussed the high likelihood of a successful procedure. We discussed the risks of the procedure, including infection, bleeding, tissue injury, clip migration, and inadequate sampling. Informed written consent was given. The usual time-out protocol was performed immediately prior to the procedure. # 1) LEFT breast, lesion quadrant: Outer breast, 3 o'clock location. Using sterile technique with chlorhexidine as skin antisepsis, 1% Lidocaine as local anesthetic, under direct ultrasound visualization, a 12 gauge Bard Marquee core needle device placed through an 11 gauge introducer needle was used to perform biopsy of the largest LEFT breast mass at the 3 o'clock location using a lateral approach. At the conclusion of the procedure, a ribbon shaped tissue marker clip was deployed into the biopsy cavity. #  2) LEFT axillary lymph node: Using sterile technique with chlorhexidine as skin antisepsis, 1% lidocaine as local anesthetic, under  direct ultrasound visualization, a 14 gauge Bard Marquee core needle device placed through a 13 gauge introducer needle was used perform biopsy the pathologic LEFT axillary lymph node using an inferolateral approach. At the conclusion of the procedure, a Tribell tissue marker clip was deployed into the biopsy cavity. There were no apparent immediate complications. Follow up 2 view mammogram was performed and dictated separately. IMPRESSION: 1. Ultrasound-guided core needle biopsy of a mass involving the outer LEFT breast at the 3 o'clock location. 2. Ultrasound-guided core needle biopsy of an abnormal LEFT axillary lymph node. Electronically Signed: By: Evangeline Dakin M.D. On: 04/01/2021 10:31  MM CLIP PLACEMENT LEFT  Result Date: 04/01/2021 CLINICAL DATA:  Confirmation clip placement after ultrasound-guided core needle biopsy of a mass involving the inner LEFT breast at the 3 o'clock location and ultrasound-guided core needle biopsy of a pathologic LEFT axillary lymph node. EXAM: 2D and 3D DIAGNOSTIC LEFT MAMMOGRAM POST ULTRASOUND BIOPSY COMPARISON:  Previous exam(s). FINDINGS: 2D and 3D full field CC and mediolateral images were obtained following ultrasound guided core needle biopsy of a mass in the inner LEFT breast and biopsy of a LEFT axillary lymph node. The ribbon shaped tissue biopsy marking clip is appropriately positioned within the biopsied mass in the inner LEFT breast at the 3 o'clock location. The Tribell tissue marker clip is appropriately position within the biopsied lymph node in the LEFT axilla. Expected post biopsy changes are present at both sites without evidence of hematoma. IMPRESSION: 1. Appropriate positioning of the ribbon shaped biopsy marking clip within the biopsied mass in the outer LEFT breast at the 3 o'clock location. 2. Appropriate positioning of the Tribell biopsy marking clip within the biopsied lymph node in the LEFT axilla. Final Assessment: Post Procedure Mammograms for  Marker Placement Electronically Signed   By: Evangeline Dakin M.D.   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 This was found to be concordant by Dr. Peggye Fothergill. Pathology revealed INVASIVE DUCTAL CARCINOMA WITH NECROSIS, NO NODAL TISSUE IDENTIFIED of the LEFT axilla, (tribell clip). This was found to be concordant by Dr. Peggye Fothergill. Pathology results were discussed with the patient by telephone. The patient reported doing well after the biopsies with tenderness at the sites. Post biopsy instructions and care were reviewed and questions were answered. The patient was encouraged to call [facility] for any additional concerns. My direct phone number was provided. The patient has a diagnosis of metastatic LEFT breast cancer and should follow her outlined treatment plan. The patient is scheduled for a telephone visit with Dr. Nicholas [facility]ose at [facility] on April 07, 2021. Dr. Lindi Adie; Wilber Bihari, NP; Bary Castilla and Iris Pert, Oncology RN Nurse Navigators, were notified of biopsy results via EPIC message on April 02, 2021. Pathology results reported by Terie Purser, RN on 04/02/2021. Electronically Signed   By: Evangeline Dakin M.D.   On: 04/02/2021 12:21   Result Date: 04/02/2021 CLINICAL DATA:  46 year old with a personal history of malignant lumpectomy of the UPPER OUTER QUADRANT of the LEFT breast and targeted LEFT axillary node dissection in March, 2020 (after neoadjuvant chemotherapy), with  LEFT axillary node dissection in April, 2021. She now has multiple masses throughout the LEFT breast, the largest in the outer breast at the 3 o'clock location measuring 4.6 cm. 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 The largest LEFT breast mass and a pathologic LEFT axillary lymph node are targeted for biopsy. EXAM: ULTRASOUND-GUIDED CORE NEEDLE BIOPSY OF THE LEFT BREAST ULTRASOUND-GUIDED CORE NEEDLE BIOPSY OF A LEFT AXILLARY NODE COMPARISON:  Previous exam(s). FINDINGS: I met with the patient and we discussed the procedure of ultrasound-guided biopsy, including benefits and alternatives. We discussed the high likelihood of a successful procedure. We discussed the risks of the procedure, including infection, bleeding, tissue injury, clip migration, and inadequate sampling. Informed written consent was given. The usual time-out protocol was performed immediately prior to the procedure. # 1) LEFT breast, lesion quadrant: Outer breast, 3 o'clock location. Using sterile technique with chlorhexidine as skin antisepsis, 1% Lidocaine as local anesthetic, under direct ultrasound visualization, a 12 gauge Bard Marquee core needle device placed through an 11 gauge introducer needle was used to perform biopsy of the largest LEFT breast mass at the 3 o'clock location using a lateral approach. At the conclusion of the procedure, a ribbon shaped tissue marker clip was deployed into the biopsy cavity. # 2) LEFT axillary lymph node: Using sterile technique with chlorhexidine as skin antisepsis, 1% lidocaine as local anesthetic, under direct ultrasound visualization, a 14 gauge Bard Marquee core needle device placed through a 13 gauge introducer needle was used perform biopsy the pathologic LEFT axillary lymph node using an inferolateral approach. At the conclusion of the procedure, a Tribell tissue marker clip was deployed into the biopsy cavity. There were no apparent immediate complications. Follow up 2 view mammogram was performed and dictated separately. IMPRESSION: 1. Ultrasound-guided core needle biopsy of a mass involving the outer LEFT breast at the 3  o'clock location. 2. Ultrasound-guided core needle biopsy of an abnormal LEFT axillary lymph node. Electronically Signed: By: Evangeline Dakin M.D. On: 04/01/2021 10:31  US THORACENTESIS ASP PLEURAL SPACE W/IMG GUIDE  Result Date: 04/28/2021 INDICATION: History of metastatic breast cancer. Shortness of breath, pleural effusion seen on previous chest x-ray. Request for therapeutic and diagnostic thoracentesis. EXAM: ULTRASOUND GUIDED RIGHT THORACENTESIS MEDICATIONS: 15 mL 1% lidocaine COMPLICATIONS: None immediate. PROCEDURE: An ultrasound guided thoracentesis was thoroughly discussed with the patient and questions answered. The benefits, risks, alternatives and complications were also discussed. The patient understands and wishes to proceed with the procedure. Written consent was obtained. Ultrasound was performed to localize and mark an adequate pocket of fluid in the right chest. The area was then prepped and draped in the normal sterile fashion. 1% Lidocaine was used for local anesthesia. Under ultrasound guidance a 6 Fr Safe-T-Centesis catheter was introduced. Thoracentesis was performed. The catheter was removed and a dressing applied. FINDINGS: A total of approximately 1 L of hazy brown fluid was removed. Samples were sent to the laboratory as requested by the clinical team. Post procedure chest X-ray reviewed, negative for pneumothorax. IMPRESSION: Successful ultrasound guided right thoracentesis yielding 1 L of pleural fluid. Read by: Durenda Guthrie, PA-C Electronically Signed   By: Jerilynn Mages.  Shick M.D.   On: 04/28/2021 15:49    Microbiology: Recent Results (from the past 240 hour(s))  Culture, blood (Routine X 2) w Reflex to ID Panel     Status: None   Collection Time: 04/22/21  5:36 PM  Specimen: Porta Cath; Blood  Result Value Ref Range Status   Specimen Description   Final    PORTA CATH Performed at [facility] [address] [place], [place] [address]    Special Requests    Final    BOTTLES DRAWN AEROBIC AND ANAEROBIC Blood Culture adequate volume Performed at [facility] [address], [place], [place] [address]    Culture   Final    NO GROWTH 5 DAYS Performed at [facility], [place] [address], [place], [place] [address]    Report Status 04/27/2021 FINAL  Final  Culture, blood (Routine X 2) w Reflex to ID Panel     Status: None   Collection Time: 04/22/21  5:37 PM   Specimen: Right Antecubital; Blood  Result Value Ref Range Status   Specimen Description   Final    RIGHT ANTECUBITAL Performed at [facility] [address], [place], [place] [address]    Special Requests   Final    BOTTLES DRAWN AEROBIC ONLY Blood Culture adequate volume Performed at [facility] [address], [place], [place] [address]    Culture   Final    NO GROWTH 5 DAYS Performed at [facility], [place] [address], [place], [place] [address]    Report Status 04/27/2021 FINAL  Final  Resp Panel by RT-PCR (Flu A&B, Covid) Nasopharyngeal Swab     Status: None   Collection Time: 04/22/21  6:04 PM   Specimen: Nasopharyngeal Swab; Nasopharyngeal(NP) swabs in vial transport medium  Result Value Ref Range Status   SARS Coronavirus 2 by RT PCR NEGATIVE NEGATIVE Final    Comment: (NOTE) SARS-CoV-2 target nucleic acids are NOT DETECTED.  The SARS-CoV-2 RNA is generally detectable in upper respiratory specimens during the acute phase of infection. The lowest concentration of SARS-CoV-2 viral copies this assay can detect is 138 copies/mL. A negative result does not preclude SARS-Cov-2 infection and should not be used as the sole basis for treatment or other patient management decisions. A negative result may occur with  improper specimen collection/handling, submission of specimen other than nasopharyngeal swab, presence of viral mutation(s) within the areas targeted by this assay, and inadequate number of  viral copies(<138 copies/mL). A negative result must be combined with clinical observations, patient history, and epidemiological information. The expected result is Negative.  Fact Sheet for Patients:  EntrepreneurPulse.com.au  Fact Sheet for Healthcare Providers:  IncredibleEmployment.be  This test is no t yet approved or cleared by the [place] FDA and  has been authorized for detection and/or diagnosis of SARS-CoV-2 by FDA under an Emergency Use Authorization (EUA). This EUA will remain  in effect (meaning this test can be used) for the duration of the COVID-19 declaration under Section 564(b)(1) of the Act, 21 U.S.C.section 360bbb-3(b)(1), unless the authorization is terminated  or revoked sooner.       Influenza A by PCR NEGATIVE NEGATIVE Final   Influenza B by PCR NEGATIVE NEGATIVE Final    Comment: (NOTE) The Xpert Xpress SARS-CoV-2/FLU/RSV plus assay is intended as an aid in the diagnosis of influenza from Nasopharyngeal swab specimens and should not be used as a sole basis for treatment. Nasal washings and aspirates are unacceptable for Xpert Xpress SARS-CoV-2/FLU/RSV testing.  Fact Sheet for Patients: EntrepreneurPulse.com.au  Fact Sheet for Healthcare Providers: IncredibleEmployment.be  This test is not yet approved or cleared by the [place] FDA and has been authorized for detection and/or diagnosis  of SARS-CoV-2 by FDA under an Emergency Use Authorization (EUA). This EUA will remain in effect (meaning this test can be used) for the duration of the COVID-19 declaration under Section 564(b)(1) of the Act, 21 U.S.C. section 360bbb-3(b)(1), unless the authorization is terminated or revoked.  Performed at [facility], [place] [address] [place], [place] [address]   MRSA Next Gen by PCR, Nasal     Status: None   Collection Time: 04/22/21  6:25 PM   Specimen: Nasal  Mucosa; Nasal Swab  Result Value Ref Range Status   MRSA by PCR Next Gen NOT DETECTED NOT DETECTED Final    Comment: (NOTE) The GeneXpert MRSA Assay (FDA approved for NASAL specimens only), is one component of a comprehensive MRSA colonization surveillance program. It is not intended to diagnose MRSA infection nor to guide or monitor treatment for MRSA infections. Test performance is not FDA approved in patients less than 19 years old. Performed at [facility], [place] [address], [place], [place] [address]   Urine Culture     Status: Abnormal   Collection Time: 04/23/21  8:03 AM   Specimen: Urine, Clean Catch  Result Value Ref Range Status   Specimen Description   Final    URINE, CLEAN CATCH Performed at [facility], [place] [address], [place], [place] [address]    Special Requests   Final    NONE Performed at [facility], [place] [address], [place], [place] [address]    Culture (A)  Final    <10,000 COLONIES/mL INSIGNIFICANT GROWTH Performed at [facility] [address] [place], [place] [address]    Report Status 04/24/2021 FINAL  Final  Culture, blood (routine x 2)     Status: None (Preliminary result)   Collection Time: 04/28/21  1:46 PM   Specimen: BLOOD  Result Value Ref Range Status   Specimen Description   Final    BLOOD BLOOD RIGHT HAND Performed at [facility] [address] [place], [place] [address]    Special Requests   Final    Blood Culture adequate volume BOTTLES DRAWN AEROBIC ONLY Performed at [facility] [address] [place], [place] [address]    Culture   Final    NO GROWTH 2 DAYS Performed at [facility] [address], [place], [place] [address]    Report Status PENDING  Incomplete  Culture, blood (routine x 2)     Status: None (Preliminary result)   Collection Time: 04/28/21  1:46 PM   Specimen: BLOOD  Result Value Ref Range Status    Specimen Description   Final    BLOOD BLOOD RIGHT HAND Performed at [facility] [address] [place] [address], [place], [place] [address]    Special Requests   Final    BOTTLES DRAWN AEROBIC ONLY Blood Culture adequate volume Performed at [facility] [address] [place], [place] [address]    Culture   Final    NO GROWTH 2 DAYS Performed at [facility] [address] [place], [place] [address]    Report Status PENDING  Incomplete  Body fluid culture w Gram Stain     Status: None (Preliminary result)   Collection Time: 04/28/21  2:37 PM   Specimen: PATH Cytology Pleural fluid  Result Value Ref Range Status   Specimen Description   Final    PLEURAL Performed at [facility], [address] [place], [place] [address]    Special Requests   Final    NONE Performed at [facility], [place] [address] [place], [place] [address]    Gram Stain   Final    RARE WBC PRESENT, PREDOMINANTLY MONONUCLEAR NO ORGANISMS SEEN    Culture   Final    NO GROWTH 2 DAYS Performed at [facility], [place] [address], [place],  [address]    Report Status PENDING  Incomplete     Labs: Basic Metabolic Panel: Recent Labs  Lab 04/25/21 0500 04/25/21 2114 04/26/21 0533 04/27/21 0434 04/28/21 0454 04/29/21 0500 04/30/21 0435  NA 130*   < > 130* 131* 130* 131* 131*  K 3.0*   < > 3.3* 3.3* 3.5 3.4* 3.3*  CL 101   < > 100 101 99 100 99  CO2 24   < > 26 26 25 25 26   GLUCOSE 112*   < > 178* 109* 107* 111* 138*  BUN 7   < > 6 5* 5* 6 7  CREATININE <0.30*   < > <0.30* 0.37* 0.36* 0.36* 0.46  CALCIUM 6.9*   < > 7.0* 7.3* 7.7* 7.5* 7.6*  MG 1.9   < > 2.1 1.7 1.5* 1.5* 1.5*  PHOS <1.0*  --  <1.0* 1.3*  --  2.2*  --    < > = values in this interval not displayed.   Liver Function Tests: Recent Labs  Lab 04/26/21 0533 04/27/21 0434 04/28/21 0454 04/29/21 0500 04/30/21 0435  AST 164* 169* 168* 196* 151*  ALT 138*  148* 150* 172* 140*  ALKPHOS 375* 379* 395* 370* 328*  BILITOT 5.0* 4.3* 4.3* 3.6* 2.9*  PROT 4.8* 5.1* 5.1* 5.2* 4.8*  ALBUMIN <1.5* <1.5* <1.5* <1.5* <1.5*   No results for input(s): LIPASE, AMYLASE in the last 168 hours. No results for input(s): AMMONIA in the last 168 hours. CBC: Recent Labs  Lab 04/26/21 0533 04/27/21 0434 04/28/21 0454 04/29/21 0500 04/30/21 0435  WBC 22.1* 25.0* 20.8* 17.8* 14.8*  NEUTROABS 18.1* 18.9* 15.6* 13.5* 11.4*  HGB 9.6* 9.4* 9.1* 9.1* 7.6*  HCT 28.9* 28.6* 27.9* 27.9* 23.6*  MCV 88.7 89.9 91.5 92.1 92.5  PLT 149* 144* 188 220 245   Cardiac Enzymes: No results for input(s): CKTOTAL, CKMB, CKMBINDEX, TROPONINI in the last 168 hours. BNP: BNP (last 3 results) No results for input(s): BNP in the last 8760 hours.  ProBNP (last 3 results) No results for input(s): PROBNP in the last 8760 hours.  CBG: No results for input(s): GLUCAP in the last 168 hours.  FURTHER DISCHARGE INSTRUCTIONS:   Get Medicines reviewed and adjusted: Please take all your medications with you for your next visit with your Primary MD   Laboratory/radiological data: Please request your Primary MD to go over all hospital tests and procedure/radiological results at the follow up, please ask your Primary MD to get all Hospital records sent to his/her office.   In some cases, they will be blood work, cultures and biopsy results pending at the time of your discharge. Please request that your primary care M.D. goes through all the records of your hospital data and follows up on these results.   Also Note the following: If you experience worsening of your admission symptoms, develop shortness of breath, life threatening emergency, suicidal or homicidal thoughts you must seek medical attention immediately by calling 911 or calling your MD immediately  if symptoms less severe.   You must read  complete instructions/literature along with all the possible adverse reactions/side  effects for all the Medicines you take and that have been prescribed to you. Take any new Medicines after you have completely understood and accpet all the possible adverse reactions/side effects.    Do not drive when taking Pain medications or sleeping medications (Benzodaizepines)   Do not take more than prescribed Pain, Sleep and Anxiety Medications. It is not advisable to combine anxiety,sleep and pain medications without talking with your primary care practitioner   Special Instructions: If you have smoked or chewed Tobacco  in the last 2 yrs please stop smoking, stop any regular Alcohol  and or any Recreational drug use.   Wear Seat belts while driving.   Please note: You were cared for by a hospitalist during your hospital stay. Once you are discharged, your primary care physician will handle any further medical issues. Please note that NO REFILLS for any discharge medications will be authorized once you are discharged, as it is imperative that you return to your primary care physician (or establish a relationship with a primary care physician if you do not have one) for your post hospital discharge needs so that they can reassess your need for medications and monitor your lab values.     Signed:  Florencia Reasons MD, PhD, FACP  [facility] 04/30/2021, 4:35 PM

## 2021-05-01 LAB — HEMOGLOBIN AND HEMATOCRIT, BLOOD
HCT: 29 % — ABNORMAL LOW (ref 36.0–46.0)
Hemoglobin: 9.3 g/dL — ABNORMAL LOW (ref 12.0–15.0)

## 2021-05-01 MED ORDER — HEPARIN SOD (PORK) LOCK FLUSH 100 UNIT/ML IV SOLN
500.0000 [IU] | Freq: Once | INTRAVENOUS | Status: AC
  Administered 2021-05-01: 500 [IU] via INTRAVENOUS
  Filled 2021-05-01: qty 5

## 2021-05-01 NOTE — Plan of Care (Signed)

## 2021-05-01 NOTE — Progress Notes (Signed)
Discharge got delayed due to elevating hospital bed delivered. She is feeling better and states she is  ready to go home today.  Detail please see discharge summary done on 2/3.

## 2021-05-01 NOTE — Progress Notes (Signed)
PT Cancellation Note  Patient Details Name: Paige Perry MRN: [id number] DOB: 1975/10/05   Cancelled Treatment:    Reason Eval/Treat Not Completed: Other (comment) Pt reports she is discharging and wants to [facility].  RN confirms pt discharging shortly.  Pt reports she has been getting up to chair and walked in hall yesterday.  States needs min A to get up. Plan is for sister to help her out at home and getting HHPT set up for next week.  Will hold today.   Abran Richard, PT Acute Rehab Services Pager [phone] [facility] 05/01/2021, 2:05 PM

## 2021-05-01 NOTE — Plan of Care (Signed)
  Problem: Coping: Goal: Level of anxiety will decrease Outcome: Not Progressing   Problem: Pain Managment: Goal: General experience of comfort will improve Outcome: Not Progressing   

## 2021-05-01 NOTE — TOC Transition Note (Signed)
Transition of Care [facility]) - CM/SW Discharge Note   Patient Details  Name: Paige Perry MRN: [id number] Date of Birth: 1975-06-17  Transition of Care [facility]) CM/SW Contact:  Ross Ludwig, LCSW Phone Number: 05/01/2021, 4:00 PM   Clinical Narrative:     Patient requested a hospital bed a rolling walker.  DME should be delivered today to patient's home address.  CSW confirmed with [employer] that equipment is being delivered today.  Patient has been set up with [facility] for home health RN and PT.  [place] signing off, plan is for patient to return back home with home health services.   Final next level of care: [place] Barriers to Discharge: Barriers Resolved   Patient Goals and CMS Choice Patient states their goals for this hospitalization and ongoing recovery are:: To return back home with home health. CMS Medicare.gov Compare Post Acute Care list provided to:: Patient Choice offered to / list presented to : Patient  Discharge Placement  Home with home health.                     Discharge Plan and Services   Discharge Planning Services: CM Consult            DME Arranged: Hospital bed, Walker rolling DME Agency: [employer] Date DME Agency Contacted: 05/01/21 Time DME Agency Contacted: 1000 Representative spoke with at DME Agency: [place]: PT, RN [place] Agency: [place] Date [place]: 04/30/21 Time [place]: 21 Representative spoke with at [place]: Cindie  Social Determinants of Health ([place]) Interventions     Readmission Risk Interventions Readmission Risk Prevention Plan 04/28/2021  Transportation Screening Complete  PCP or Specialist Appt within 3-5 Days Complete  HRI or [place] Complete  Social Work Consult for [place] Planning/Counseling Complete  Palliative Care Screening Complete  Medication Review Press photographer) Complete  Some recent data might be hidden

## 2021-05-02 ENCOUNTER — Other Ambulatory Visit: Payer: Self-pay | Admitting: Hematology and Oncology

## 2021-05-02 LAB — BPAM RBC
Blood Product Expiration Date: 202302232359
ISSUE DATE / TIME: 202302031125
Unit Type and Rh: 7300

## 2021-05-02 LAB — BODY FLUID CULTURE W GRAM STAIN: Culture: NO GROWTH

## 2021-05-02 LAB — TYPE AND SCREEN
ABO/RH(D): B POS
Antibody Screen: NEGATIVE
Unit division: 0

## 2021-05-03 ENCOUNTER — Encounter: Payer: Self-pay | Admitting: Hematology and Oncology

## 2021-05-03 ENCOUNTER — Telehealth: Payer: Self-pay

## 2021-05-03 LAB — CULTURE, BLOOD (ROUTINE X 2)
Culture: NO GROWTH
Culture: NO GROWTH
Special Requests: ADEQUATE
Special Requests: ADEQUATE

## 2021-05-03 MED ORDER — FENTANYL 25 MCG/HR TD PT72: 1.0000 | MEDICATED_PATCH | TRANSDERMAL | 0 refills | Status: AC

## 2021-05-03 NOTE — Telephone Encounter (Signed)
Spoke with patient regarding scheduling Palliative Care consult. Patient states she is being followed by [facility] for Palliative Care. She has HH and therapy setup. She declined Palliative Care. Will cancel referral.

## 2021-05-04 LAB — CYTOLOGY - NON PAP

## 2021-05-05 NOTE — Progress Notes (Addendum)
Patient Care Team: [name], NP as PCP - General Mauro Kaufmann, RN as Oncology Nurse Navigator Rockwell Germany, RN as Oncology Nurse Navigator Donnie Mesa, MD as Consulting Physician (General Surgery) Nicholas Lose, MD as Consulting Physician (Hematology and Oncology) Kyung Rudd, MD as Consulting Physician (Radiation Oncology) P[place]pack-Cousar, Carlena Sax, NP as Nurse Practitioner (Nurse Practitioner)  DIAGNOSIS:    ICD-10-CM   1. Malignant neoplasm of upper-outer quadrant of left breast in female, estrogen receptor positive ([place])  C50.412 DISCONTINUED: pembrolizumab (KEYTRUDA) 200 mg in sodium chloride 0.9 % 50 mL chemo infusion   Z17.0 DISCONTINUED: PACLitaxel-protein bound (ABRAXANE) chemo infusion 75 mg      SUMMARY OF ONCOLOGIC HISTORY: Oncology History  Malignant neoplasm of upper-outer quadrant of left breast in female, estrogen receptor positive ([place])  01/18/2019 Initial Diagnosis   Patient palpated a left breast lump for 2 weeks. Mammogram showed a 2.3cm left breast mass at the 12 o'clock position, with 1 enlarged left axillary lymph node measuring 3.5cm. 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   01/23/2019 Cancer Staging   Staging form: Breast, AJCC 8th Edition - Clinical stage from 01/23/2019: Stage IIA (cT2, cN1(f), cM0, G2, ER+, PR-, HER2+)   02/01/2019 Genetic Testing   Negative genetic testing. No pathogenic variants identified on the [facility] Common Hereditary Cancers Panel. The Common Hereditary Cancers Panel offered by [facility] includes sequencing and/or deletion duplication testing of the following 48 genes: APC, ATM, AXIN2, BARD1, BMPR1A, BRCA1, BRCA2, BRIP1, CDH1, CDKN2A (p14ARF), CDKN2A (p16INK4a), CKD4, CHEK2, CTNNA1, DICER1, EPCAM (Deletion/duplication testing only), GREM1 (promoter region deletion/duplication testing only), KIT, MEN1, MLH1, MSH2, MSH3, MSH6, MUTYH, NBN, NF1, NHTL1, PALB2, PDGFRA, PMS2, POLD1, POLE,  PTEN, RAD50, RAD51C, RAD51D, RNF43, SDHB, SDHC, SDHD, SMAD4, SMARCA4. STK11, TP53, TSC1, TSC2, and VHL.  The following genes were evaluated for sequence changes only: SDHA and HOXB13 c.251G>A variant only. The report date is 02/01/2019.    02/13/2019 - 05/28/2019 Chemotherapy   dexamethasone (DECADRON) 4 MG tablet, 4 mg (100 % of original dose 4 mg), Oral, Daily, 1 of 1 cycle, Start date: 01/23/2019, End date: 05/28/2019. Dose modification: 4 mg (original dose 4 mg, Cycle 0)  palonosetron (ALOXI) injection 0.25 mg, 0.25 mg, Intravenous,  Once, 6 of 6 cycles. Administration: 0.25 mg (02/13/2019), 0.25 mg (03/04/2019), 0.25 mg (05/07/2019), 0.25 mg (05/28/2019), 0.25 mg (03/26/2019), 0.25 mg (04/18/2019)  pegfilgrastim-jmdb (FULPHILA) injection 6 mg, 6 mg, Subcutaneous,  Once, 6 of 6 cycles. Administration: 6 mg (02/15/2019), 6 mg (03/06/2019), 6 mg (05/09/2019), 6 mg (05/30/2019), 6 mg (03/28/2019), 6 mg (04/20/2019)  CARBOplatin (PARAPLATIN) 700 mg in sodium chloride 0.9 % 250 mL chemo infusion, 700 mg (100 % of original dose 700 mg), Intravenous,  Once, 6 of 6 cycles. Dose modification: 700 mg (original dose 700 mg, Cycle 1). Administration: 700 mg (02/13/2019), 700 mg (03/04/2019), 700 mg (05/07/2019), 700 mg (05/28/2019), 700 mg (03/26/2019), 700 mg (04/18/2019)  DOCEtaxel (TAXOTERE) 140 mg in sodium chloride 0.9 % 250 mL chemo infusion, 75 mg/m2 = 140 mg, Intravenous,  Once, 6 of 6 cycles. Dose modification: 65 mg/m2 (original dose 75 mg/m2, Cycle 5, Reason: Dose not tolerated). Administration: 140 mg (02/13/2019), 140 mg (03/04/2019), 120 mg (05/07/2019), 120 mg (05/28/2019), 140 mg (03/26/2019), 140 mg (04/18/2019)  pertuzumab (PERJETA) 420 mg in sodium chloride 0.9 % 250 mL chemo infusion, 420 mg (100 % of original dose 420 mg), Intravenous, Once, 3 of 3 cycles. Dose modification: 420 mg (original dose 420 mg, Cycle  1, Reason: Provider Judgment). Administration: 420 mg (02/13/2019), 420 mg (03/04/2019), 420 mg  (03/26/2019)  fosaprepitant (EMEND) 150 mg  dexamethasone (DECADRON) 12 mg in sodium chloride 0.9 % 145 mL IVPB, , Intravenous,  Once, 6 of 6 cycles. Administration:  (02/13/2019),  (03/04/2019),  (05/07/2019),  (05/28/2019),  (03/26/2019),  (04/18/2019)  trastuzumab-dkst (OGIVRI) 651 mg in sodium chloride 0.9 % 250 mL chemo infusion, 8 mg/kg = 651 mg, Intravenous,  Once, 6 of 6 cycles. Administration: 651 mg (02/13/2019), 483 mg (03/04/2019), 483 mg (05/07/2019), 483 mg (05/28/2019), 483 mg (03/26/2019), 483 mg (04/18/2019).   06/13/2019 Surgery   Left lumpectomy ([place]) [phone]): no residual carcinoma, 2/3 left axillary lymph nodes positive for carcinoma. Lymphovascular space invasion was present.   06/20/2019 Cancer Staging   Staging form: Breast, AJCC 8th Edition - Pathologic stage from 06/20/2019: No Stage Recommended (ypT0, pN1, cM0, ER+, PR-, HER2-)   07/11/2019 Surgery   Axillary lymph node dissection ([place]) ([id number]): 3/3 lymph nodes positive for cancer   08/19/2019 - 10/07/2019 Radiation Therapy   The patient initially received a dose of 50.4 Gy in 28 fractions to the breast and SCLV region using a 4-field approach. This was delivered using a 3-D conformal technique. The patient then received a boost to the seroma. This delivered an additional 10 Gy in 5 fractions using a 3-field photon boost technique. The total dose was 60.4 Gy.   09/2019 - 09/2029 Anti-estrogen oral therapy   Tamoxifen   04/15/2021 -  Chemotherapy   Patient is on Treatment Plan : BREAST Paclitaxel-Albumin (Abraxane) (100) D1,8,15 + Pembrolizumab (200) every 3 weeks, q28d     Liver metastases ([place])  04/19/2021 Initial Diagnosis   Liver metastases ([place])   04/19/2021 Cancer Staging   Staging form: Liver, AJCC 8th Edition - Clinical: Stage IVB (cM1) - Signed by Gardenia Phlegm, NP on 04/19/2021      CHIEF COMPLIANT: Cycle 2 Abraxane and pembrolizumab  INTERVAL HISTORY: Paige Perry is a 46 y.o. with  above-mentioned history of metastatic breast cancer, currently on chemotherapy with Abraxane and pembrolizumab. She presents to the clinic today for treatment.  She is complaining of worsening jaundice as well as increased skin itching.  She continues to feel somewhat poorly at this time.  After she left the hospital she felt much better.  But now she is starting to decline again.  ALLERGIES:  is allergic to other, adhesive [tape], cefepime, latex, lovenox [enoxaparin], and tapentadol.  MEDICATIONS:  Current Outpatient Medications  Medication Sig Dispense Refill   antiseptic oral rinse (BIOTENE) LIQD 15 mLs by Mouth Rinse route as needed for dry mouth. 59 mL 0   dexamethasone (DECADRON) 1 MG tablet Take 1 tablet (1 mg total) by mouth daily for 14 days. 14 tablet 0   feeding supplement (ENSURE ENLIVE / ENSURE PLUS) LIQD Take 237 mLs by mouth 3 (three) times daily between meals. 237 mL 12   fentaNYL (DURAGESIC) 25 MCG/HR Place 1 patch onto the skin every 3 (three) days. 10 patch 0   lidocaine (XYLOCAINE) 2 % solution Use as directed 15 mLs in the mouth or throat every 3 (three) hours as needed for mouth pain. 100 mL 0   lidocaine-prilocaine (EMLA) cream Apply to affected area once (Patient taking differently: 1 application daily as needed. Apply to affected area once) 30 g 3   magnesium oxide (MAG-OX) 400 (240 Mg) MG tablet Take 1 tablet (400 mg total) by mouth daily. 30 tablet 0   midodrine (PROAMATINE) 2.5  MG tablet Take 1 tablet (2.5 mg total) by mouth 2 (two) times daily with a meal for 7 days. 14 tablet 0   Multiple Vitamin (MULTIVITAMIN WITH MINERALS) TABS tablet Take 1 tablet by mouth daily. 30 tablet 0   pantoprazole (PROTONIX) 40 MG tablet Take 1 tablet (40 mg total) by mouth daily. 30 tablet 0   polyethylene glycol (MIRALAX / GLYCOLAX) 17 g packet Take 17 g by mouth daily. 14 each 0   potassium chloride SA (KLOR-CON M) 20 MEQ tablet Take 2 tablets (40 mEq total) by mouth daily. 30 tablet 0    sodium chloride (OCEAN) 0.65 % SOLN nasal spray Place 1 spray into both nostrils as needed for congestion. 15 mL 0   No current facility-administered medications for this visit.   Facility-Administered Medications Ordered in Other Visits  Medication Dose Route Frequency Provider Last Rate Last Admin   sodium chloride flush (NS) 0.9 % injection 10 mL  10 mL Intracatheter PRN Nicholas Lose, MD   10 mL at 05/06/21 1158    PHYSICAL EXAMINATION: ECOG PERFORMANCE STATUS: 1 - Symptomatic but completely ambulatory  Vitals:   05/06/21 0832  BP: 91/63  Pulse: (!) 145  Resp: 19  Temp: (!) 97.5 F (36.4 C)  SpO2: 99%   Filed Weights   05/06/21 0832  Weight: 150 lb 4.8 oz (68.2 kg)     LABORATORY DATA:  I have reviewed the data as listed CMP Latest Ref Rng & Units 05/06/2021 04/30/2021 04/29/2021  Glucose 70 - 99 mg/dL 107(H) 138(H) 111(H)  BUN 6 - 20 mg/dL 7 7 6   Creatinine 0.44 - 1.00 mg/dL 0.41(L) 0.46 0.36(L)  Sodium 135 - 145 mmol/L 133(L) 131(L) 131(L)  Potassium 3.5 - 5.1 mmol/L 3.1(L) 3.3(L) 3.4(L)  Chloride 98 - 111 mmol/L 96(L) 99 100  CO2 22 - 32 mmol/L 27 26 25   Calcium 8.9 - 10.3 mg/dL 8.9 7.6(L) 7.5(L)  Total Protein 6.5 - 8.1 g/dL 7.0 4.8(L) 5.2(L)  Total Bilirubin 0.3 - 1.2 mg/dL 13.0(HH) 2.9(H) 3.6(H)  Alkaline Phos 38 - 126 U/L 938(H) 328(H) 370(H)  AST 15 - 41 U/L 165(H) 151(H) 196(H)  ALT 0 - 44 U/L 212(H) 140(H) 172(H)    Lab Results  Component Value Date   WBC 19.3 (H) 05/06/2021   HGB 12.1 05/06/2021   HCT 36.6 05/06/2021   MCV 87.4 05/06/2021   PLT 513 (H) 05/06/2021   NEUTROABS 14.2 (H) 05/06/2021    ASSESSMENT & PLAN:  Malignant neoplasm of upper-outer quadrant of left breast in female, estrogen receptor positive ([place]) 01/18/2019: Patient palpated a left breast lump for 2 weeks. Mammogram showed a 2.3cm left breast mass at the 12 o'clock position, with 1 enlarged left axillary lymph node measuring 3.5cm. Biopsy showed invasive ductal carcinoma in the  left breast and axilla.  ER positive, PR negative, HER-2 positive (initial IHC 3+) accidentally FISH was done which was negative. T2N1 stage IIa clinical stage   Treatment Summary: 1. Neoadjuvant chemotherapy with TCH Perjeta 6 cycles (maintenance anti-HER-2 therapy is not being given because the initial pathology was reread as HER-2 negative  2. 06/13/2019 left lumpectomy ([place]): no residual carcinoma, 2/3 left axillary lymph nodes positive for carcinoma. 07/15/2019: ALN D: 3/3 lymph nodes positive.  No extranodal extension 3. Followed by adjuvant radiation therapy started 5/25/2021CT Chest 01/25/21: No PE, Lymphadenopathy in mediastinum and hilar regions, Mass vs LN in axillary tain METASTATIC progression: 03/23/2021: Widespread metastatic disease including the liver thoracoabdominal nodes and left chest wall/breast.  Gallbladder wall thickening, small right pleural effusion. 03/25/2021: Diagnostic mammogram showing several left breast masses concerning for malignancy the largest being 4.6 cm.   -------------------------------------------------------------------------------------------------------------------------------------------------------------------------------------------------------------------------------------- Current treatment: Abraxane pembrolizumab first dose given 04/15/2021 Toxicities: 1.  Severe fatigue 2.  Hospitalization for neutropenic fever and failure to thrive 04/22/2021-04/30/2021: Reduce the dose of Abraxane to 40 mg per metered square. 3.  Mouth sores: I recommend Magic mouthwash     Severely elevated LFTs: Unfortunately the bilirubin has increased to 13..  I am recommending that she needs to receive Abraxane next week as well.     Prognosis: Patient fully understands the severity of her disease.  If she can handle a few treatments we might be able to prolong her life.  The LFTs coming down are a very good sign of response.   Plan:   Tachycardia and hypertension:  Persistent problems even during the hospital stay.  We had given her extensive amount of fluids in the hospital and checked her for PE and ruled that out as well.  Return to clinic in 1 week for labs and to discuss adding Abraxane to her treatment on that day.      No orders of the defined types were placed in this encounter.  The patient has a good understanding of the overall plan. she agrees with it. she will call with any problems that may develop before the next visit here.  Total time spent: 30 mins including face to face time and time spent for planning, charting and coordination of care  Rulon Eisenmenger, MD, MPH 05/06/2021  I, Thana Ates, am acting as scribe for Dr. Nicholas Lose.  I have reviewed the above documentation for accuracy and completeness, and I agree with the above.   Addendum: Because of the sudden and rapid increase in the bilirubin levels, I recommended that we add Abraxane to-day 8 of her treatment. I left a voicemail because I could not reach her. I will speak to her when she comes back next week to see me. I also instructed her to take potassium daily for the hypokalemia.

## 2021-05-05 NOTE — Assessment & Plan Note (Signed)
01/18/2019:Patient palpated a left breast lump for 2 weeks. Mammogram showed a 2.3cm left breast mass at the 12 o'clock position, with 1 enlarged left axillary lymph node measuring 3.5cm. 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 T2N1 stage IIa clinical stage  Treatment Summary: 1. Neoadjuvant chemotherapy with TCH Perjeta 6 cycles (maintenance anti-HER-2 therapy is not being given because the initial pathology was reread as HER-2 negative  2. 06/13/2019 left lumpectomy ([place]): no residual carcinoma, 2/3 left axillary lymph nodes positive for carcinoma. 07/15/2019: ALN D: 3/3 lymph nodes positive. No extranodal extension 3. Followed by adjuvant radiation therapystarted 5/25/2021CT Chest 01/25/21: No PE, Lymphadenopathy in mediastinum and hilar regions, Mass vs LN in axillary tain METASTATIC progression: 03/23/2021: Widespread metastatic disease including the liver thoracoabdominal nodes and left chest wall/breast.  Gallbladder wall thickening, small right pleural effusion. 03/25/2021: Diagnostic mammogram showing several left breast masses concerning for malignancy the largest being 4.6 cm.  -------------------------------------------------------------------------------------------------------------------------------------------------------------------------------------------------------------------------------------- Current treatment: Abraxane pembrolizumab first dose given 04/15/2021 Toxicities: 1.  Severe fatigue 2. neutropenic fever: Temperature is 100.4 F with white count of 0.7, ANC is pending.  I recommended daily Granix injections 480 mcg until Intercourse is greater than 1000, prophylactic antibiotics 3.  Mouth sores: I recommend Magic mouthwash 4.  Sore throat: Yesterday she was started on Diflucan for thrush.  Severely elevated LFTs: After first cycle of  chemo her LFTs are starting to come down. She no longer has widespread abdominal pain  Prognosis: Patient fully understands the severity of her disease.  If she can handle a few treatments we might be able to prolong her life.  The LFTs coming down are a very good sign of response.  Plan: Hospitalization for neutropenic fever and failure to thrive

## 2021-05-06 ENCOUNTER — Other Ambulatory Visit: Payer: BC Managed Care – PPO

## 2021-05-06 ENCOUNTER — Other Ambulatory Visit: Payer: Self-pay

## 2021-05-06 ENCOUNTER — Encounter: Payer: Self-pay | Admitting: Hematology and Oncology

## 2021-05-06 ENCOUNTER — Inpatient Hospital Stay: Payer: BC Managed Care – PPO | Attending: Hematology and Oncology | Admitting: Hematology and Oncology

## 2021-05-06 ENCOUNTER — Encounter: Payer: Self-pay | Admitting: *Deleted

## 2021-05-06 ENCOUNTER — Other Ambulatory Visit: Payer: Self-pay | Admitting: *Deleted

## 2021-05-06 ENCOUNTER — Inpatient Hospital Stay: Payer: BC Managed Care – PPO

## 2021-05-06 ENCOUNTER — Inpatient Hospital Stay: Payer: BC Managed Care – PPO | Admitting: Dietician

## 2021-05-06 DIAGNOSIS — G893 Neoplasm related pain (acute) (chronic): Secondary | ICD-10-CM | POA: Diagnosis not present

## 2021-05-06 DIAGNOSIS — K5900 Constipation, unspecified: Secondary | ICD-10-CM | POA: Insufficient documentation

## 2021-05-06 DIAGNOSIS — Z170 Estrogen receptor positive status [ER+]: Secondary | ICD-10-CM | POA: Diagnosis not present

## 2021-05-06 DIAGNOSIS — R7989 Other specified abnormal findings of blood chemistry: Secondary | ICD-10-CM | POA: Insufficient documentation

## 2021-05-06 DIAGNOSIS — R634 Abnormal weight loss: Secondary | ICD-10-CM | POA: Insufficient documentation

## 2021-05-06 DIAGNOSIS — C50412 Malignant neoplasm of upper-outer quadrant of left female breast: Secondary | ICD-10-CM | POA: Insufficient documentation

## 2021-05-06 DIAGNOSIS — Z5111 Encounter for antineoplastic chemotherapy: Secondary | ICD-10-CM | POA: Diagnosis not present

## 2021-05-06 DIAGNOSIS — R5383 Other fatigue: Secondary | ICD-10-CM | POA: Insufficient documentation

## 2021-05-06 DIAGNOSIS — Z79899 Other long term (current) drug therapy: Secondary | ICD-10-CM | POA: Diagnosis not present

## 2021-05-06 LAB — CMP (CANCER CENTER ONLY)
ALT: 212 U/L — ABNORMAL HIGH (ref 0–44)
AST: 165 U/L — ABNORMAL HIGH (ref 15–41)
Albumin: 2.8 g/dL — ABNORMAL LOW (ref 3.5–5.0)
Alkaline Phosphatase: 938 U/L — ABNORMAL HIGH (ref 38–126)
Anion gap: 10 (ref 5–15)
BUN: 7 mg/dL (ref 6–20)
CO2: 27 mmol/L (ref 22–32)
Calcium: 8.9 mg/dL (ref 8.9–10.3)
Chloride: 96 mmol/L — ABNORMAL LOW (ref 98–111)
Creatinine: 0.41 mg/dL — ABNORMAL LOW (ref 0.44–1.00)
GFR, Estimated: >60 mL/min (ref >60)
Glucose, Bld: 107 mg/dL — ABNORMAL HIGH (ref 70–99)
Potassium: 3.1 mmol/L — ABNORMAL LOW (ref 3.5–5.1)
Sodium: 133 mmol/L — ABNORMAL LOW (ref 135–145)
Total Bilirubin: 13 mg/dL (ref 0.3–1.2)
Total Protein: 7 g/dL (ref 6.5–8.1)

## 2021-05-06 LAB — CBC WITH DIFFERENTIAL (CANCER CENTER ONLY)
Abs Immature Granulocytes: 0.1 10*3/uL — ABNORMAL HIGH (ref 0.00–0.07)
Basophils Absolute: 0.1 10*3/uL (ref 0.0–0.1)
Basophils Relative: 1 %
Eosinophils Absolute: 0.8 10*3/uL — ABNORMAL HIGH (ref 0.0–0.5)
Eosinophils Relative: 4 %
HCT: 36.6 % (ref 36.0–46.0)
Hemoglobin: 12.1 g/dL (ref 12.0–15.0)
Immature Granulocytes: 1 %
Lymphocytes Relative: 15 %
Lymphs Abs: 2.8 10*3/uL (ref 0.7–4.0)
MCH: 28.9 pg (ref 26.0–34.0)
MCHC: 33.1 g/dL (ref 30.0–36.0)
MCV: 87.4 fL (ref 80.0–100.0)
Monocytes Absolute: 1.3 10*3/uL — ABNORMAL HIGH (ref 0.1–1.0)
Monocytes Relative: 7 %
Neutro Abs: 14.2 10*3/uL — ABNORMAL HIGH (ref 1.7–7.7)
Neutrophils Relative %: 72 %
Platelet Count: 513 10*3/uL — ABNORMAL HIGH (ref 150–400)
RBC: 4.19 MIL/uL (ref 3.87–5.11)
RDW: 18.4 % — ABNORMAL HIGH (ref 11.5–15.5)
WBC Count: 19.3 10*3/uL — ABNORMAL HIGH (ref 4.0–10.5)
nRBC: 0.1 % (ref 0.0–0.2)

## 2021-05-06 LAB — MAGNESIUM: Magnesium: 1.5 mg/dL — ABNORMAL LOW (ref 1.7–2.4)

## 2021-05-06 LAB — TSH: TSH: 2.28 u[IU]/mL (ref 0.308–3.960)

## 2021-05-06 MED ORDER — PACLITAXEL PROTEIN-BOUND CHEMO INJECTION 100 MG
40.0000 mg/m2 | Freq: Once | INTRAVENOUS | Status: AC
  Administered 2021-05-06: 75 mg via INTRAVENOUS
  Filled 2021-05-06: qty 15

## 2021-05-06 MED ORDER — PROCHLORPERAZINE MALEATE 10 MG PO TABS
10.0000 mg | ORAL_TABLET | Freq: Once | ORAL | Status: AC
  Administered 2021-05-06: 10 mg via ORAL
  Filled 2021-05-06: qty 1

## 2021-05-06 MED ORDER — SODIUM CHLORIDE 0.9 % IV SOLN: Freq: Once | INTRAVENOUS | Status: AC

## 2021-05-06 MED ORDER — POTASSIUM CHLORIDE CRYS ER 20 MEQ PO TBCR: 20.0000 meq | EXTENDED_RELEASE_TABLET | Freq: Every day | ORAL | 1 refills | Status: DC

## 2021-05-06 MED ORDER — SODIUM CHLORIDE 0.9% FLUSH
10.0000 mL | INTRAVENOUS | Status: DC | PRN
  Administered 2021-05-06: 10 mL

## 2021-05-06 MED ORDER — SODIUM CHLORIDE 0.9 % IV SOLN
200.0000 mg | Freq: Once | INTRAVENOUS | Status: AC
  Administered 2021-05-06: 200 mg via INTRAVENOUS
  Filled 2021-05-06: qty 200

## 2021-05-06 MED ORDER — HEPARIN SOD (PORK) LOCK FLUSH 100 UNIT/ML IV SOLN
500.0000 [IU] | Freq: Once | INTRAVENOUS | Status: AC | PRN
  Administered 2021-05-06: 500 [IU]

## 2021-05-06 NOTE — Progress Notes (Signed)
Per MD okay to treat with HR 145 and BP 91/63.  Verbal orders received for pt to received 1L NS along with tx today. Orders placed under sign and held for infusion RN to release.

## 2021-05-06 NOTE — Addendum Note (Signed)
Addended by: Nicholas Lose on: 05/06/2021 12:58 PM   Modules accepted: Orders

## 2021-05-06 NOTE — Patient Instructions (Signed)
[facility] ONCOLOGY  Discharge Instructions: Thank you for choosing [facility] to provide your oncology and hematology care.   If you have a lab appointment with the [facility], please go directly to the [facility] and check in at the registration area.   Wear comfortable clothing and clothing appropriate for easy access to any Portacath or PICC line.   We strive to give you quality time with your provider. You may need to reschedule your appointment if you arrive late (15 or more minutes).  Arriving late affects you and other patients whose appointments are after yours.  Also, if you miss three or more appointments without notifying the office, you may be dismissed from the clinic at the providers discretion.      For prescription refill requests, have your pharmacy contact our office and allow 72 hours for refills to be completed.    Today you received the following chemotherapy and/or immunotherapy agents: keytruda/abraxane.      To help prevent nausea and vomiting after your treatment, we encourage you to take your nausea medication as directed.  BELOW ARE SYMPTOMS THAT SHOULD BE REPORTED IMMEDIATELY: *FEVER GREATER THAN 100.4 F (38 C) OR HIGHER *CHILLS OR SWEATING *NAUSEA AND VOMITING THAT IS NOT CONTROLLED WITH YOUR NAUSEA MEDICATION *UNUSUAL SHORTNESS OF BREATH *UNUSUAL BRUISING OR BLEEDING *URINARY PROBLEMS (pain or burning when urinating, or frequent urination) *BOWEL PROBLEMS (unusual diarrhea, constipation, pain near the anus) TENDERNESS IN MOUTH AND THROAT WITH OR WITHOUT PRESENCE OF ULCERS (sore throat, sores in mouth, or a toothache) UNUSUAL RASH, SWELLING OR PAIN  UNUSUAL VAGINAL DISCHARGE OR ITCHING   Items with * indicate a potential emergency and should be followed up as soon as possible or go to the Emergency Department if any problems should occur.  Please show the CHEMOTHERAPY ALERT CARD or IMMUNOTHERAPY ALERT CARD at  check-in to the Emergency Department and triage nurse.  Should you have questions after your visit or need to cancel or reschedule your appointment, please contact [facility]  Dept: [phone]  and follow the prompts.  Office hours are 8:00 a.m. to 4:30 p.m. Monday - Friday. Please note that voicemails left after 4:00 p.m. may not be returned until the following business day.  We are closed weekends and major holidays. You have access to a nurse at all times for urgent questions. Please call the main number to the clinic Dept: [phone] and follow the prompts.   For any non-urgent questions, you may also contact your provider using MyChart. We now offer e-Visits for anyone 47 and older to request care online for non-urgent symptoms. For details visit [email or web address].   Also download the MyChart app! Go to the app store, search "MyChart", open the app, select [facility], and log in with your MyChart username and password.  Due to Covid, a mask is required upon entering the hospital/clinic. If you do not have a mask, one will be given to you upon arrival. For doctor visits, patients may have 1 support person aged 47 or older with them. For treatment visits, patients cannot have anyone with them due to current Covid guidelines and our immunocompromised population.

## 2021-05-06 NOTE — Progress Notes (Signed)
Nutrition Assessment   Reason for Assessment: MST   ASSESSMENT: 46 year old female with estrogen receptor positive breast cancer metastatic to liver. She is currently receiving abraxane and keyturda. Patient is under the care of Dr. Lindi Adie.   Patient admitted to hospital 1/26-2/4 for neutropenic fever and oral ulceration. Stage II pressure injury to mid sacrum noted  Met with patient in infusion. She is resting with eyes closed this morning, but awakens with name call and agreeable to visit. Patient reports appetite is "iffy" due to altered taste of foods and early satiety. Recently, foods "just don't taste the way they should." Meats do not appeal to her. Patient eating mostly vegetables, eggs, jello. Patient is drinking Ensure Original (220 kcal, 9 grams protein). She does not consume these on a daily basis. Patient reports mouth sores have healed. Stage II pressure injury present, per pt report.   Nutrition Focused Physical Exam: unable to complete   Medications: Decadron, Fentanyl, Xylocaine, Mag-ox, MVI, Protonix, Miralax, Klor-con, Compazine   Labs: Mg 1.5, Na 133, K 3.1, Glucose 107, Cr 0.41, Albumin 2.6, AST 165, ALT 212, Alkaline Phos 938, Bilirubin 13   Anthropometrics: Weights have decreased 8% in 3 weeks. Patient weighed 163 lb 12.8 oz on 1/19. Patient weights have decreased 14% from her usual weight in the last 3 months. This is significant  Height: 5'3" Weight: 150 lb 4.8 oz  UBW: 175-180 lb (July-Nov 2022 per chart) BMI: 26.62   NUTRITION DIAGNOSIS: Inadequate oral intake related to cancer and associated treatment side effects as evidenced by reported anorexia, altered taste, recent hospital admission with neutropenic fever and oral ulceration, 8% weight loss in 3 weeks; significant  MALNUTRITION DIAGNOSIS: Patient meets criteria for severe malnutrition related to chronic illness (metastatic breast cancer) as evidenced by dietary recall meeting less than 75% of estimated  needs >/= one month, significant 14% decrease from usual weight in 3 months.    INTERVENTION:  Educated on importance of calories and protein to maintain weight/strength as well as increased protein needs to promote healing of pressure injury - soft moist high protein foods provided Discussed strategies for poor appetite, encouraged bites/sips q2 hours of nutrient dense food/supplements - handout with snack ideas provided Recommend 2-3 Ensure/day and suggested switching to Ensure Plus/equivalent for more calories/protein - samples of Ensure Complete, Ensure Plus provided for patient to try, coupons given Discussed importance of oral care, suggested using new toothbrush s/p thrush Discussed strategies for altered taste - handout provided Recommend baking soda, salt water rinses several times daily - recipe given Contact information provided    MONITORING, EVALUATION, GOAL: Patient will tolerate increased calories and protein to minimize weight loss and promote healing of pressure injury   Next Visit: Thursday March 2 during infusion with Pamala Hurry

## 2021-05-06 NOTE — Progress Notes (Signed)
CRITICAL VALUE STICKER  CRITICAL VALUE: Total Bili 13.0  RECEIVER (on-site recipient of call): Merleen Nicely, [place] NOTIFIED: 05/06/21 at [phone]  MESSENGER (representative from lab):Verdis Frederickson  MD NOTIFIED: Nicholas Lose   RESPONSE: Per MD okay to treat today

## 2021-05-06 NOTE — Progress Notes (Signed)
Per MD okay to treat today with AST 165 and ALT 212

## 2021-05-07 ENCOUNTER — Telehealth: Payer: Self-pay | Admitting: *Deleted

## 2021-05-07 ENCOUNTER — Telehealth: Payer: Self-pay | Admitting: Hematology and Oncology

## 2021-05-07 ENCOUNTER — Encounter: Payer: Self-pay | Admitting: Hematology and Oncology

## 2021-05-07 ENCOUNTER — Other Ambulatory Visit: Payer: Self-pay | Admitting: *Deleted

## 2021-05-07 LAB — T4: T4, Total: 9.3 ug/dL (ref 4.5–12.0)

## 2021-05-07 LAB — CALCIUM, IONIZED: Calcium, Ionized, Serum: 5 mg/dL (ref 4.5–5.6)

## 2021-05-07 MED ORDER — POTASSIUM CHLORIDE ER 10 MEQ PO CPCR: 20.0000 meq | ORAL_CAPSULE | Freq: Every day | ORAL | 1 refills | Status: AC

## 2021-05-07 NOTE — Telephone Encounter (Signed)
Scheduled follow-up appointment per 2/9 los. Patient is aware. °

## 2021-05-07 NOTE — Telephone Encounter (Signed)
Patient received letter from [facility] requesting medical documentation for review of Disability benefits received by this nurse today from collaborative for claim no.#: [id number]. Request for records only without new form for forms nurse to process.   Above letter with prior FMLA form also reads claim no.#: [id number] completed 04/15/2021 to "Record Release" bin in front office area before Managed [employer] for [place] staff to forward to [facility] ([place]) Information Management Office, Phone: [phone], Fax: [phone] to complete this process.

## 2021-05-10 NOTE — Telephone Encounter (Signed)
Voicemail from [employer] [phone]) "Request return call.  Paper work given to Dr. Lindi Adie on 05/06/2021.  Do not see where anything has been sent in yet.  Deadline is February 19th."      Connected with Paige Perry to inform her [facility] does not release records.  A signed release is required.  Friday, 05/07/2021 this nurse prepared request to be forwarded to ([place]) Health Information Management Department to process has not been received by this office.  Confirmed phone number [phone] opt 2 for a status check and/or provide authorization via telephone for records if circumstance allows. Currently no further questions or needs.

## 2021-05-12 ENCOUNTER — Other Ambulatory Visit: Payer: Self-pay | Admitting: *Deleted

## 2021-05-12 ENCOUNTER — Ambulatory Visit: Payer: BC Managed Care – PPO | Admitting: Hematology and Oncology

## 2021-05-12 ENCOUNTER — Other Ambulatory Visit: Payer: BC Managed Care – PPO

## 2021-05-12 DIAGNOSIS — C50412 Malignant neoplasm of upper-outer quadrant of left female breast: Secondary | ICD-10-CM

## 2021-05-12 DIAGNOSIS — Z170 Estrogen receptor positive status [ER+]: Secondary | ICD-10-CM

## 2021-05-12 MED ORDER — MAGIC MOUTHWASH W/LIDOCAINE: 5.0000 mL | Freq: Three times a day (TID) | ORAL | 1 refills | Status: DC | PRN

## 2021-05-12 NOTE — Progress Notes (Signed)
Received call from [facility] stating pt requesting script for Magic Mouth Wash for oral sores.  Reviewed with MD and prescription successfully faxed to pharmacy.

## 2021-05-12 NOTE — Progress Notes (Signed)
Patient Care Team: [name], NP as PCP - General Mauro Kaufmann, RN as Oncology Nurse Navigator Rockwell Germany, RN as Oncology Nurse Navigator Donnie Mesa, MD as Consulting Physician (General Surgery) Nicholas Lose, MD as Consulting Physician (Hematology and Oncology) Kyung Rudd, MD as Consulting Physician (Radiation Oncology) Pickenpack-Cousar, Carlena Sax, NP as Nurse Practitioner (Nurse Practitioner)  DIAGNOSIS:    ICD-10-CM   1. Malignant neoplasm of upper-outer quadrant of left breast in female, estrogen receptor positive ([name])  C50.412    Z17.0       SUMMARY OF ONCOLOGIC HISTORY: Oncology History  Malignant neoplasm of upper-outer quadrant of left breast in female, estrogen receptor positive ([name])  01/18/2019 Initial Diagnosis   Patient palpated a left breast lump for 2 weeks. Mammogram showed a 2.3cm left breast mass at the 12 o'clock position, with 1 enlarged left axillary lymph node measuring 3.5cm. 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   01/23/2019 Cancer Staging   Staging form: Breast, AJCC 8th Edition - Clinical stage from 01/23/2019: Stage IIA (cT2, cN1(f), cM0, G2, ER+, PR-, HER2+)   02/01/2019 Genetic Testing   Negative genetic testing. No pathogenic variants identified on the [facility] Common Hereditary Cancers Panel. The Common Hereditary Cancers Panel offered by [facility] includes sequencing and/or deletion duplication testing of the following 48 genes: APC, ATM, AXIN2, BARD1, BMPR1A, BRCA1, BRCA2, BRIP1, CDH1, CDKN2A (p14ARF), CDKN2A (p16INK4a), CKD4, CHEK2, CTNNA1, DICER1, EPCAM (Deletion/duplication testing only), GREM1 (promoter region deletion/duplication testing only), KIT, MEN1, MLH1, MSH2, MSH3, MSH6, MUTYH, NBN, NF1, NHTL1, PALB2, PDGFRA, PMS2, POLD1, POLE, PTEN, RAD50, RAD51C, RAD51D, RNF43, SDHB, SDHC, SDHD, SMAD4, SMARCA4. STK11, TP53, TSC1, TSC2, and VHL.  The following genes were evaluated for sequence changes  only: SDHA and HOXB13 c.251G>A variant only. The report date is 02/01/2019.    02/13/2019 - 05/28/2019 Chemotherapy   dexamethasone (DECADRON) 4 MG tablet, 4 mg (100 % of original dose 4 mg), Oral, Daily, 1 of 1 cycle, Start date: 01/23/2019, End date: 05/28/2019. Dose modification: 4 mg (original dose 4 mg, Cycle 0)  palonosetron (ALOXI) injection 0.25 mg, 0.25 mg, Intravenous,  Once, 6 of 6 cycles. Administration: 0.25 mg (02/13/2019), 0.25 mg (03/04/2019), 0.25 mg (05/07/2019), 0.25 mg (05/28/2019), 0.25 mg (03/26/2019), 0.25 mg (04/18/2019)  pegfilgrastim-jmdb (FULPHILA) injection 6 mg, 6 mg, Subcutaneous,  Once, 6 of 6 cycles. Administration: 6 mg (02/15/2019), 6 mg (03/06/2019), 6 mg (05/09/2019), 6 mg (05/30/2019), 6 mg (03/28/2019), 6 mg (04/20/2019)  CARBOplatin (PARAPLATIN) 700 mg in sodium chloride 0.9 % 250 mL chemo infusion, 700 mg (100 % of original dose 700 mg), Intravenous,  Once, 6 of 6 cycles. Dose modification: 700 mg (original dose 700 mg, Cycle 1). Administration: 700 mg (02/13/2019), 700 mg (03/04/2019), 700 mg (05/07/2019), 700 mg (05/28/2019), 700 mg (03/26/2019), 700 mg (04/18/2019)  DOCEtaxel (TAXOTERE) 140 mg in sodium chloride 0.9 % 250 mL chemo infusion, 75 mg/m2 = 140 mg, Intravenous,  Once, 6 of 6 cycles. Dose modification: 65 mg/m2 (original dose 75 mg/m2, Cycle 5, Reason: Dose not tolerated). Administration: 140 mg (02/13/2019), 140 mg (03/04/2019), 120 mg (05/07/2019), 120 mg (05/28/2019), 140 mg (03/26/2019), 140 mg (04/18/2019)  pertuzumab (PERJETA) 420 mg in sodium chloride 0.9 % 250 mL chemo infusion, 420 mg (100 % of original dose 420 mg), Intravenous, Once, 3 of 3 cycles. Dose modification: 420 mg (original dose 420 mg, Cycle 1, Reason: Provider Judgment). Administration: 420 mg (02/13/2019), 420 mg (03/04/2019), 420 mg (03/26/2019)  fosaprepitant (EMEND) 150 mg  dexamethasone (DECADRON) 12 mg in sodium chloride 0.9 % 145 mL IVPB, , Intravenous,  Once, 6 of 6 cycles. Administration:   (02/13/2019),  (03/04/2019),  (05/07/2019),  (05/28/2019),  (03/26/2019),  (04/18/2019)  trastuzumab-dkst (OGIVRI) 651 mg in sodium chloride 0.9 % 250 mL chemo infusion, 8 mg/kg = 651 mg, Intravenous,  Once, 6 of 6 cycles. Administration: 651 mg (02/13/2019), 483 mg (03/04/2019), 483 mg (05/07/2019), 483 mg (05/28/2019), 483 mg (03/26/2019), 483 mg (04/18/2019).   06/13/2019 Surgery   Left lumpectomy ([place]i) [phone]): no residual carcinoma, 2/3 left axillary lymph nodes positive for carcinoma. Lymphovascular space invasion was present.   06/20/2019 Cancer Staging   Staging form: Breast, AJCC 8th Edition - Pathologic stage from 06/20/2019: No Stage Recommended (ypT0, pN1, cM0, ER+, PR-, HER2-)   07/11/2019 Surgery   Axillary lymph node dissection (Tsuei) ([id number]): 3/3 lymph nodes positive for cancer   08/19/2019 - 10/07/2019 Radiation Therapy   The patient initially received a dose of 50.4 Gy in 28 fractions to the breast and SCLV region using a 4-field approach. This was delivered using a 3-D conformal technique. The patient then received a boost to the seroma. This delivered an additional 10 Gy in 5 fractions using a 3-field photon boost technique. The total dose was 60.4 Gy.   09/2019 - 09/2029 Anti-estrogen oral therapy   Tamoxifen   04/15/2021 -  Chemotherapy   Patient is on Treatment Plan : BREAST Paclitaxel-Albumin (Abraxane) (100) D1,8,15 + Pembrolizumab (200) every 3 weeks, q28d     Liver metastases ([place])  04/19/2021 Initial Diagnosis   Liver metastases ([place])   04/19/2021 Cancer Staging   Staging form: Liver, AJCC 8th Edition - Clinical: Stage IVB (cM1) - Signed by Gardenia Phlegm, NP on 04/19/2021      CHIEF COMPLIANT: Day 8, Cycle 2 Abraxane and pembrolizumab  INTERVAL HISTORY: Paige Perry is a 46 y.o. with above-mentioned history of metastatic breast cancer, currently on chemotherapy with Abraxane and pembrolizumab. She presents to the clinic today for treatment.  She  continues to have trouble with itching.  She has had a few good days over the past week.  She continues to have tachycardia and hypotension and dizziness.  ALLERGIES:  is allergic to other, adhesive [tape], cefepime, latex, lovenox [enoxaparin], and tapentadol.  MEDICATIONS:  Current Outpatient Medications  Medication Sig Dispense Refill   potassium chloride (MICRO-K) 10 MEQ CR capsule Take 2 capsules (20 mEq total) by mouth daily. 60 capsule 1   antiseptic oral rinse (BIOTENE) LIQD 15 mLs by Mouth Rinse route as needed for dry mouth. 59 mL 0   dexamethasone (DECADRON) 1 MG tablet Take 1 tablet (1 mg total) by mouth daily for 14 days. 14 tablet 0   feeding supplement (ENSURE ENLIVE / ENSURE PLUS) LIQD Take 237 mLs by mouth 3 (three) times daily between meals. 237 mL 12   fentaNYL (DURAGESIC) 25 MCG/HR Place 1 patch onto the skin every 3 (three) days. 10 patch 0   lidocaine (XYLOCAINE) 2 % solution Use as directed 15 mLs in the mouth or throat every 3 (three) hours as needed for mouth pain. 100 mL 0   lidocaine-prilocaine (EMLA) cream Apply to affected area once (Patient taking differently: 1 application daily as needed. Apply to affected area once) 30 g 3   magic mouthwash SOLN Take 5 mLs by mouth 3 (three) times daily as needed for mouth pain. 240 mL 1   magnesium oxide (MAG-OX) 400 (240 Mg) MG tablet Take 1 tablet (400  mg total) by mouth daily. 30 tablet 0   Multiple Vitamin (MULTIVITAMIN WITH MINERALS) TABS tablet Take 1 tablet by mouth daily. 30 tablet 0   pantoprazole (PROTONIX) 40 MG tablet Take 1 tablet (40 mg total) by mouth daily. 30 tablet 0   polyethylene glycol (MIRALAX / GLYCOLAX) 17 g packet Take 17 g by mouth daily. 14 each 0   sodium chloride (OCEAN) 0.65 % SOLN nasal spray Place 1 spray into both nostrils as needed for congestion. 15 mL 0   No current facility-administered medications for this visit.    PHYSICAL EXAMINATION: ECOG PERFORMANCE STATUS: 1 - Symptomatic but  completely ambulatory  Vitals:   05/13/21 1238  BP: 94/63  Pulse: (!) 143  Resp: 19  Temp: 97.8 F (36.6 C)  SpO2: 100%   Filed Weights   05/13/21 1238  Weight: 147 lb 8 oz (66.9 kg)    LABORATORY DATA:  I have reviewed the data as listed CMP Latest Ref Rng & Units 05/13/2021 05/06/2021 04/30/2021  Glucose 70 - 99 mg/dL 132(H) 107(H) 138(H)  BUN 6 - 20 mg/dL _0 Creatinine 0.44 - 1.00 mg/dL 0.34(L) 0.41(L) 0.46  Sodium 135 - 145 mmol/L 125(L) 133(L) 131(L)  Potassium 3.5 - 5.1 mmol/L 3.2(L) 3.1(L) 3.3(L)  Chloride 98 - 111 mmol/L 89(L) 96(L) 99  CO2 22 - 32 mmol/L _1 Calcium 8.9 - 10.3 mg/dL 8.6(L) 8.9 7.6(L)  Total Protein 6.5 - 8.1 g/dL 6.7 7.0 4.8(L)  Total Bilirubin 0.3 - 1.2 mg/dL 11.1(HH) 13.0(HH) 2.9(H)  Alkaline Phos 38 - 126 U/L 640(H) 938(H) 328(H)  AST 15 - 41 U/L 147(H) 165(H) 151(H)  ALT 0 - 44 U/L 130(H) 212(H) 140(H)    Lab Results  Component Value Date   WBC 10.1 05/13/2021   HGB 10.1 (L) 05/13/2021   HCT 29.4 (L) 05/13/2021   MCV 85.7 05/13/2021   PLT 415 (H) 05/13/2021   NEUTROABS 4.7 05/13/2021    ASSESSMENT & PLAN:  Malignant neoplasm of upper-outer quadrant of left breast in female, estrogen receptor positive ([place]) 01/18/2019: Patient palpated a left breast lump for 2 weeks. Mammogram showed a 2.3cm left breast mass at the 12 o'clock position, with 1 enlarged left axillary lymph node measuring 3.5cm. 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  ER positive, PR negative, HER-2 positive (initial IHC 3+) accidentally FISH was done which was negative. T2N1 stage IIa clinical stage   Treatment Summary: 1. Neoadjuvant chemotherapy with TCH Perjeta 6 cycles (maintenance anti-HER-2 therapy is not being given because the initial pathology was reread as HER-2 negative  2. 06/13/2019 left lumpectomy (Tsuei): no residual carcinoma, 2/3 left axillary lymph nodes positive for carcinoma. 07/15/2019: ALN D: 3/3 lymph nodes  positive.  No extranodal extension 3. Followed by adjuvant radiation therapy started 5/25/2021CT Chest 01/25/21: No PE, Lymphadenopathy in mediastinum and hilar regions, Mass vs LN in axillary tain METASTATIC progression: 03/23/2021: Widespread metastatic disease including the liver thoracoabdominal nodes and left chest wall/breast.  Gallbladder wall thickening, small right pleural effusion. 03/25/2021: Diagnostic mammogram showing several left breast masses concerning for malignancy the largest being 4.6 cm.   -------------------------------------------------------------------------------------------------------------------------------------------------------------------------------------------------------------------------------------- Current treatment: Abraxane pembrolizumab first dose given 04/15/2021, today cycle 2-day 8 Toxicities: 1.  Severe fatigue 2.  Hospitalization for neutropenic fever and failure to thrive 04/22/2021-04/30/2021: Reduce the dose of Abraxane to 40 mg per metered square. 3.  Mouth sores: She does not like the Magic mouthwash that we prescribed for her.  Severely elevated LFTs: Unfortunately the bilirubin has increased to 13..  I am recommending that she needs to receive Abraxane next week as well.     Prognosis: Patient fully understands the severity of her disease.  If she can handle a few treatments we might be able to prolong her life.  The LFTs coming down are a very good sign of response.   Plan: Proceed with Abraxane today.   Tachycardia and hypotension: Persistent problems even during the hospital stay.  We had given her extensive amount of fluids in the hospital and checked her for PE and ruled that out as well.   Severe hyperbilirubinemia: Monitoring closely.  It is causing her severe itching.   I believe that we need to give Abraxane every week to control her disease. She will get Keytruda every 3 weeks. Return to clinic in 1 week for  follow-up    No orders of the defined types were placed in this encounter.  The patient has a good understanding of the overall plan. she agrees with it. she will call with any problems that may develop before the next visit here.  Total time spent: 30 mins including face to face time and time spent for planning, charting and coordination of care  Rulon Eisenmenger, MD, MPH 05/13/2021  I, Thana Ates, am acting as scribe for Dr. Nicholas Lose.  I have reviewed the above documentation for accuracy and completeness, and I agree with the above.

## 2021-05-12 NOTE — Progress Notes (Signed)
Received call from [facility] requesting manual wheelchair for pt. Verbal orders received from MD and order placed for DME. RN placed call to [employer] to set up delivery.

## 2021-05-13 ENCOUNTER — Inpatient Hospital Stay (HOSPITAL_BASED_OUTPATIENT_CLINIC_OR_DEPARTMENT_OTHER): Payer: BC Managed Care – PPO | Admitting: Nurse Practitioner

## 2021-05-13 ENCOUNTER — Inpatient Hospital Stay: Payer: BC Managed Care – PPO

## 2021-05-13 ENCOUNTER — Inpatient Hospital Stay (HOSPITAL_BASED_OUTPATIENT_CLINIC_OR_DEPARTMENT_OTHER): Payer: BC Managed Care – PPO | Admitting: Hematology and Oncology

## 2021-05-13 ENCOUNTER — Other Ambulatory Visit: Payer: Self-pay | Admitting: *Deleted

## 2021-05-13 ENCOUNTER — Other Ambulatory Visit: Payer: Self-pay

## 2021-05-13 ENCOUNTER — Encounter: Payer: Self-pay | Admitting: Nurse Practitioner

## 2021-05-13 DIAGNOSIS — E876 Hypokalemia: Secondary | ICD-10-CM

## 2021-05-13 DIAGNOSIS — G893 Neoplasm related pain (acute) (chronic): Secondary | ICD-10-CM

## 2021-05-13 DIAGNOSIS — Z95828 Presence of other vascular implants and grafts: Secondary | ICD-10-CM

## 2021-05-13 DIAGNOSIS — Z170 Estrogen receptor positive status [ER+]: Secondary | ICD-10-CM

## 2021-05-13 DIAGNOSIS — R530 Neoplastic (malignant) related fatigue: Secondary | ICD-10-CM | POA: Diagnosis not present

## 2021-05-13 DIAGNOSIS — Z515 Encounter for palliative care: Secondary | ICD-10-CM | POA: Diagnosis not present

## 2021-05-13 DIAGNOSIS — C50412 Malignant neoplasm of upper-outer quadrant of left female breast: Secondary | ICD-10-CM | POA: Diagnosis not present

## 2021-05-13 DIAGNOSIS — Z7189 Other specified counseling: Secondary | ICD-10-CM

## 2021-05-13 DIAGNOSIS — C787 Secondary malignant neoplasm of liver and intrahepatic bile duct: Secondary | ICD-10-CM

## 2021-05-13 DIAGNOSIS — K5900 Constipation, unspecified: Secondary | ICD-10-CM

## 2021-05-13 DIAGNOSIS — Z5111 Encounter for antineoplastic chemotherapy: Secondary | ICD-10-CM | POA: Diagnosis not present

## 2021-05-13 LAB — CMP (CANCER CENTER ONLY)
ALT: 130 U/L — ABNORMAL HIGH (ref 0–44)
AST: 147 U/L — ABNORMAL HIGH (ref 15–41)
Albumin: 2.9 g/dL — ABNORMAL LOW (ref 3.5–5.0)
Alkaline Phosphatase: 640 U/L — ABNORMAL HIGH (ref 38–126)
Anion gap: 7 (ref 5–15)
BUN: 6 mg/dL (ref 6–20)
CO2: 29 mmol/L (ref 22–32)
Calcium: 8.6 mg/dL — ABNORMAL LOW (ref 8.9–10.3)
Chloride: 89 mmol/L — ABNORMAL LOW (ref 98–111)
Creatinine: 0.34 mg/dL — ABNORMAL LOW (ref 0.44–1.00)
GFR, Estimated: >60 mL/min (ref >60)
Glucose, Bld: 132 mg/dL — ABNORMAL HIGH (ref 70–99)
Potassium: 3.2 mmol/L — ABNORMAL LOW (ref 3.5–5.1)
Sodium: 125 mmol/L — ABNORMAL LOW (ref 135–145)
Total Bilirubin: 11.1 mg/dL (ref 0.3–1.2)
Total Protein: 6.7 g/dL (ref 6.5–8.1)

## 2021-05-13 LAB — CBC WITH DIFFERENTIAL (CANCER CENTER ONLY)
Abs Immature Granulocytes: 0.08 10*3/uL — ABNORMAL HIGH (ref 0.00–0.07)
Basophils Absolute: 0.1 10*3/uL (ref 0.0–0.1)
Basophils Relative: 1 %
Eosinophils Absolute: 2.5 10*3/uL — ABNORMAL HIGH (ref 0.0–0.5)
Eosinophils Relative: 25 %
HCT: 29.4 % — ABNORMAL LOW (ref 36.0–46.0)
Hemoglobin: 10.1 g/dL — ABNORMAL LOW (ref 12.0–15.0)
Immature Granulocytes: 1 %
Lymphocytes Relative: 18 %
Lymphs Abs: 1.8 10*3/uL (ref 0.7–4.0)
MCH: 29.4 pg (ref 26.0–34.0)
MCHC: 34.4 g/dL (ref 30.0–36.0)
MCV: 85.7 fL (ref 80.0–100.0)
Monocytes Absolute: 0.9 10*3/uL (ref 0.1–1.0)
Monocytes Relative: 9 %
Neutro Abs: 4.7 10*3/uL (ref 1.7–7.7)
Neutrophils Relative %: 46 %
Platelet Count: 415 10*3/uL — ABNORMAL HIGH (ref 150–400)
RBC: 3.43 MIL/uL — ABNORMAL LOW (ref 3.87–5.11)
RDW: 18.3 % — ABNORMAL HIGH (ref 11.5–15.5)
Smear Review: NORMAL
WBC Count: 10.1 10*3/uL (ref 4.0–10.5)
nRBC: 0.9 % — ABNORMAL HIGH (ref 0.0–0.2)

## 2021-05-13 LAB — TSH: TSH: 2.151 u[IU]/mL (ref 0.308–3.960)

## 2021-05-13 LAB — MAGNESIUM: Magnesium: 1.6 mg/dL — ABNORMAL LOW (ref 1.7–2.4)

## 2021-05-13 MED ORDER — PACLITAXEL PROTEIN-BOUND CHEMO INJECTION 100 MG
40.0000 mg/m2 | Freq: Once | INTRAVENOUS | Status: AC
  Administered 2021-05-13: 75 mg via INTRAVENOUS
  Filled 2021-05-13: qty 15

## 2021-05-13 MED ORDER — ONDANSETRON HCL 4 MG/2ML IJ SOLN
8.0000 mg | Freq: Once | INTRAMUSCULAR | Status: AC
  Administered 2021-05-13: 8 mg via INTRAVENOUS
  Filled 2021-05-13: qty 4

## 2021-05-13 MED ORDER — MAGIC MOUTHWASH: 5.0000 mL | Freq: Three times a day (TID) | ORAL | 1 refills | Status: AC | PRN

## 2021-05-13 MED ORDER — SODIUM CHLORIDE 0.9% FLUSH
10.0000 mL | Freq: Once | INTRAVENOUS | Status: AC
  Administered 2021-05-13: 10 mL

## 2021-05-13 MED ORDER — SODIUM CHLORIDE 0.9 % IV SOLN: Freq: Once | INTRAVENOUS | Status: AC

## 2021-05-13 MED ORDER — HEPARIN SOD (PORK) LOCK FLUSH 100 UNIT/ML IV SOLN
500.0000 [IU] | Freq: Once | INTRAVENOUS | Status: AC
  Administered 2021-05-13: 500 [IU]

## 2021-05-13 MED ORDER — SODIUM CHLORIDE 0.9 % IV SOLN: 8.0000 mg | Freq: Once | INTRAVENOUS | Status: DC

## 2021-05-13 NOTE — Patient Instructions (Signed)
[facility] MEDICAL [facility]COLOG[facility]  Discharge Instructions: Thank you for choosing [facility] to provide your oncology and hematology care.   If you have a lab appointment with the Cancer Center, please go directly to the Cancer Center and check in at the registration area.   Wear comfortable clothing and clothing appropriate for easy access to any Portacath or PICC line.   We strive to give you quality time with your provider. You may need to reschedule your appointment if you arrive late (15 or more minutes).  Arriving late affects you and other patients whose appointments are after yours.  Also, if you miss three or more appointments without notifying the office, you may be dismissed from the clinic at the provider's discretion.      For prescription refill requests, have your pharmacy contact our office and allow 72 hours for refills to be completed.    Today you received the following chemotherapy and/or immunotherapy agents: Abraxane.      To help prevent nausea and vomiting after your treatment, we encourage you to take your nausea medication as directed.  BELOW ARE SYMPTOMS THAT SHOULD BE REPORTED IMMEDIATELY: *FEVER GREATER THAN 100.4 F (38 C) OR HIGHER *CHILLS OR SWEATING *NAUSEA AND VOMITING THAT IS NOT CONTROLLED WITH YOUR NAUSEA MEDICATION *UNUSUAL SHORTNESS OF BREATH *UNUSUAL BRUISING OR BLEEDING *URINARY PROBLEMS (pain or burning when urinating, or frequent urination) *BOWEL PROBLEMS (unusual diarrhea, constipation, pain near the anus) TENDERNESS IN MOUTH AND THROAT WITH OR WITHOUT PRESENCE OF ULCERS (sore throat, sores in mouth, or a toothache) UNUSUAL RASH, SWELLING OR PAIN  UNUSUAL VAGINAL DISCHARGE OR ITCHING   Items with * indicate a potential emergency and should be followed up as soon as possible or go to the Emergency Department if any problems should occur.  Please show the CHEMOTHERAPY ALERT CARD or IMMUNOTHERAPY ALERT CARD at check-in to  the Emergency Department and triage nurse.  Should you have questions after your visit or need to cancel or reschedule your appointment, please contact [facility]  Dept: [phone]  and follow the prompts.  Office hours are 8:00 a.m. to 4:30 p.m. Monday - Friday. Please note that voicemails left after 4:00 p.m. may not be returned until the following business day.  We are closed weekends and major holidays. You have access to a nurse at all times for urgent questions. Please call the main number to the clinic Dept: [phone] and follow the prompts.   For any non-urgent questions, you may also contact your provider using MyChart. We now offer e-Visits for anyone 18 and older to request care online for non-urgent symptoms. For details visit [email or web address].   Also download the MyChart app! Go to the app store, search "MyChart", open the app, select [facility], and log in with your MyChart username and password.  Due to Covid, a mask is required upon entering the hospital/clinic. If you do not have a mask, one will be given to you upon arrival. For doctor visits, patients may have 1 support person aged 18 or older with them. For treatment visits, patients cannot have anyone with them due to current Covid guidelines and our immunocompromised population.   

## 2021-05-13 NOTE — Progress Notes (Signed)
CRITICAL VALUE STICKER  CRITICAL VALUE: total bili 11.1  RECEIVER (on-site recipient of call): Mickel Baas, [place] NOTIFIED: 05/13/21 1257  MESSENGER (representative from lab):Jay Scotton  MD NOTIFIED: Dr Lindi Adie    TIME OF NOTIFICATION:1257  RESPONSE:  OK

## 2021-05-13 NOTE — Progress Notes (Signed)
Pt requesting magic mouthwash recipe that she received while in patient.  Upon investigation pt received magic mouth wash with 1 part maalox, 1 part nystatin, 1 part benadryl.  RN reviewed with MD and verbal orders received to sent to pharmacy on file.

## 2021-05-13 NOTE — Progress Notes (Signed)
[place]  [phone] [phone]   Name: Paige Perry Date: 05/13/2021 MRN: [id number]  DOB: 1976-02-12  Patient Care Team: [name], NP as PCP - General Mauro Kaufmann, RN as Oncology Nurse Navigator Rockwell Germany, RN as Oncology Nurse Navigator Donnie Mesa, MD as Consulting Physician (General Surgery) Nicholas Lose, MD as Consulting Physician (Hematology and Oncology) Kyung Rudd, MD as Consulting Physician (Radiation Oncology) Pickenpack-Cousar, Carlena Sax, NP as Nurse Practitioner (Nurse Practitioner)   INTERVAL HISTORY: Paige Perry is a 46 y.o. female with metastatic breast cancer (ER positive) now with widespread disease.  Palliative ask to see for symptom management and goals of care.   SOCIAL HISTORY:     reports that she has never smoked. She has never used smokeless tobacco. She reports current alcohol use. She reports that she does not use drugs.  ADVANCE DIRECTIVES:  None on file.  Patient provided with advanced directive packet.  Detailed education provided.  She would like to complete documents in the future.  She was given advanced directive clinic dates with plans on scheduling for February 13.  CODE STATUS:   PAST MEDICAL HISTORY: Past Medical History:  Diagnosis Date   Asthma    Cancer ([place])    breast   Family history of breast cancer    Family history of breast cancer    Family history of prostate cancer    Family history of throat cancer    Hypercholesteremia    Hypokalemia 04/21/2021   PONV (postoperative nausea and vomiting)     ALLERGIES:  is allergic to other, adhesive [tape], cefepime, latex, lovenox [enoxaparin], and tapentadol.  MEDICATIONS:  Current Outpatient Medications  Medication Sig Dispense Refill   potassium chloride (MICRO-K) 10 MEQ CR capsule Take 2 capsules (20 mEq total) by mouth daily. 60 capsule 1   antiseptic oral rinse (BIOTENE) LIQD 15 mLs by  Mouth Rinse route as needed for dry mouth. 59 mL 0   dexamethasone (DECADRON) 1 MG tablet Take 1 tablet (1 mg total) by mouth daily for 14 days. 14 tablet 0   feeding supplement (ENSURE ENLIVE / ENSURE PLUS) LIQD Take 237 mLs by mouth 3 (three) times daily between meals. 237 mL 12   fentaNYL (DURAGESIC) 25 MCG/HR Place 1 patch onto the skin every 3 (three) days. 10 patch 0   lidocaine (XYLOCAINE) 2 % solution Use as directed 15 mLs in the mouth or throat every 3 (three) hours as needed for mouth pain. 100 mL 0   lidocaine-prilocaine (EMLA) cream Apply to affected area once (Patient taking differently: 1 application daily as needed. Apply to affected area once) 30 g 3   magic mouthwash SOLN Take 5 mLs by mouth 3 (three) times daily as needed for mouth pain. 240 mL 1   magnesium oxide (MAG-OX) 400 (240 Mg) MG tablet Take 1 tablet (400 mg total) by mouth daily. 30 tablet 0   Multiple Vitamin (MULTIVITAMIN WITH MINERALS) TABS tablet Take 1 tablet by mouth daily. 30 tablet 0   pantoprazole (PROTONIX) 40 MG tablet Take 1 tablet (40 mg total) by mouth daily. 30 tablet 0   polyethylene glycol (MIRALAX / GLYCOLAX) 17 g packet Take 17 g by mouth daily. 14 each 0   sodium chloride (OCEAN) 0.65 % SOLN nasal spray Place 1 spray into both nostrils as needed for congestion. 15 mL 0   No current facility-administered medications for this visit.    VITAL SIGNS:  There were no vitals taken for this visit. There were no vitals filed for this visit.  Estimated body mass index is 26.13 kg/m as calculated from the following:   Height as of an earlier encounter on 05/13/21: 5\' 3"  (1.6 m).   Weight as of an earlier encounter on 05/13/21: 147 lb 8 oz (66.9 kg).   PERFORMANCE STATUS (ECOG) : 1 - Symptomatic but completely ambulatory   Physical Exam General: NAD HENT: sclera icterus  Resp: normal breathing pattern Cardio: RRR Neurological: Weakness, AAO x4  IMPRESSION:  I saw Paige Perry during her infusion  today.  No acute distress noted.  Is sitting upright in recliner eating ice cream.  Expresses she feels much better compared to when we last saw each other in the hospital.  Shares she has weaned herself off of several medications.     Pain Paige Perry reports her pain seems to be managed effectively.  Is appreciative of fentanyl patch and the relief that she has obtained since starting.  She has not needed any oral medications for breakthrough pain.  Constipation Constipation is improving.  Taking MiraLAX daily with understanding if no bowel movement can increase to twice daily.  Decreased appetite She reports her appetite has improved.   4.  Goals of care Continues to emphasize goal is to continue to treat the treatable allow her every opportunity to continue to thrive.  States she is not and will not give up no matter what.  She is requesting assistance with her advanced directives.  We reviewed at length.  She and her sister has completed documents naming her Sister Paige Perry as her Education officer, community.  Patient also made selections for no life-sustaining measures in the event she is in a unconscious state with no chance of recovery.  Reports she will be open to artificial feedings and life-sustaining measures outside of those specific guidelines.  Dates provided on advanced directive clinic.  I discussed the importance of continued conversation with family and their medical providers regarding overall plan of care and treatment options, ensuring decisions are within the context of the patients values and GOCs.  PLAN: Continue fentanyl 25 mcg patch.  Pain is well controlled Continue MiraLAX 1-2 times daily for bowel regimen Education provided on advanced directive.  See above Plan to see patient back in collaboration with her other oncology appointments in 3-4 weeks.   Patient expressed understanding and was in agreement with this plan. She also understands that She can call the clinic at any  time with any questions, concerns, or complaints.   Time Total: 40 min  Visit consisted of counseling and education dealing with the complex and emotionally intense issues of symptom management and palliative care in the setting of serious and potentially life-threatening illness.Greater than 50%  of this time was spent counseling and coordinating care related to the above assessment and plan.  Alda Lea, AGPCNP-BC  Palliative Medicine Team [phone]   Signed by: Alda Lea, AGPCNP-BC [place]

## 2021-05-13 NOTE — Assessment & Plan Note (Signed)
01/18/2019:Patient palpated a left breast lump for 2 weeks. Mammogram showed a 2.3cm left breast mass at the 12 o'clock position, with 1 enlarged left axillary lymph node measuring 3.5cm. 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 T2N1 stage IIa clinical stage  TreatmentSummary: 1. Neoadjuvant chemotherapy with TCH Perjeta 6 cycles (maintenance anti-HER-2 therapy is not being given because the initial pathology was reread as HER-2 negative  2. 06/13/2019 left lumpectomy ([facility]): no residual carcinoma, 2/3 left axillary lymph nodes positive for carcinoma. 07/15/2019: ALN D: 3/3 lymph nodes positive. No extranodal extension 3. Followed by adjuvant radiation therapystarted 5/25/2021CT Chest 01/25/21: No PE, Lymphadenopathy in mediastinum and hilar regions, Mass vs LN in axillary tain METASTATIC progression: 03/23/2021: Widespread metastatic disease including the liver thoracoabdominal nodes and left chest wall/breast. Gallbladder wall thickening, small right pleural effusion. 03/25/2021: Diagnostic mammogram showing several left breast masses concerning for malignancy the largest being 4.6 cm.  -------------------------------------------------------------------------------------------------------------------------------------------------------------------------------------------------------------------------------------- Current treatment: Abraxane pembrolizumab first dose given 04/15/2021 Toxicities: 1.Severe fatigue 2. Hospitalization for neutropenic fever and failure to thrive 04/22/2021-04/30/2021: Reduce the dose of Abraxane to 40 mg per metered square. 3.Mouth sores: I recommend Magic mouthwash    Severely elevated LFTs: Unfortunately the bilirubin has increased to 13..  I am recommending that she needs to receive Abraxane next week as well.     Prognosis: Patient fully understands the severity of her disease. If she can handle a few treatments we might be able to prolong her life. The LFTs coming down are a very good sign of response.  Plan:   Tachycardia and hypotension: Persistent problems even during the hospital stay.  We had given her extensive amount of fluids in the hospital and checked her for PE and ruled that out as well.  Neutropenia: I recommended that we give Neulasta Return to clinic 2 weeks for next cycle

## 2021-05-13 NOTE — Progress Notes (Signed)
Per Dr. Lindi Adie, ok to proceed with HR of 143, pending ANC, AST of 147 U/L, ALT 130 U/L, and total bili of 11.1 mg/dL.  Per Dr. Lindi Adie, 568ml of normal saline for 1 hour.

## 2021-05-14 LAB — T4: T4, Total: 9.7 ug/dL (ref 4.5–12.0)

## 2021-05-16 LAB — CALCIUM, IONIZED: Calcium, Ionized, Serum: 4.9 mg/dL (ref 4.5–5.6)

## 2021-05-18 ENCOUNTER — Encounter: Payer: Self-pay | Admitting: Hematology and Oncology

## 2021-05-19 NOTE — Assessment & Plan Note (Signed)
01/18/2019:Patient palpated a left breast lump for 2 weeks. Mammogram showed a 2.3cm left breast mass at the 12 o'clock position, with 1 enlarged left axillary lymph node measuring 3.5cm. 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 T2N1 stage IIa clinical stage  TreatmentSummary: 1. Neoadjuvant chemotherapy with TCH Perjeta 6 cycles (maintenance anti-HER-2 therapy is not being given because the initial pathology was reread as HER-2 negative  2. 06/13/2019 left lumpectomy ([facility]): no residual carcinoma, 2/3 left axillary lymph nodes positive for carcinoma. 07/15/2019: ALN D: 3/3 lymph nodes positive. No extranodal extension 3. Followed by adjuvant radiation therapystarted 5/25/2021CT Chest 01/25/21: No PE, Lymphadenopathy in mediastinum and hilar regions, Mass vs LN in axillary tain METASTATIC progression: 03/23/2021: Widespread metastatic disease including the liver thoracoabdominal nodes and left chest wall/breast. Gallbladder wall thickening, small right pleural effusion. 03/25/2021: Diagnostic mammogram showing several left breast masses concerning for malignancy the largest being 4.6 cm.  -------------------------------------------------------------------------------------------------------------------------------------------------------------------------------------------------------------------------------------- Current treatment: Abraxane pembrolizumab first dose given 04/15/2021, today cycle 2-day 15 Toxicities: 1.Severe fatigue 2.Hospitalization for neutropenic fever and failure to thrive 04/22/2021-04/30/2021: Reduce the dose of Abraxane to 40 mg per metered square. 3.Mouth sores: She does not like the Magic mouthwash that we prescribed for her.   Severely elevated LFTs:Unfortunately the bilirubin has increased to 13..I am recommending that  she needs to receive Abraxane next week as well.   Prognosis: Patient fully understands the severity of her disease. If she can handle a few treatments we might be able to prolong her life. The LFTs coming down are a very good sign of response.  Plan:Proceed with Abraxane today.  Tachycardia and hypotension: Persistent problems even during the hospital stay. We had given her extensive amount of fluids in the hospital and checked her for PE and ruled that out as well.  Severe hyperbilirubinemia: Monitoring closely.  It is causing her severe itching.   I believe that we need to give Abraxane every week to control her disease. She will get Keytruda every 3 weeks. Return to clinic in 1 week for follow-up

## 2021-05-19 NOTE — Progress Notes (Signed)
Patient Care Team: [name], NP as PCP - General Mauro Kaufmann, RN as Oncology Nurse Navigator Rockwell Germany, RN as Oncology Nurse Navigator Donnie Mesa, MD as Consulting Physician (General Surgery) Nicholas Lose, MD as Consulting Physician (Hematology and Oncology) Kyung Rudd, MD as Consulting Physician (Radiation Oncology) Pickenpack-Cousar, Carlena Sax, NP as Nurse Practitioner (Nurse Practitioner)  DIAGNOSIS:    ICD-10-CM   1. Malignant neoplasm of upper-outer quadrant of left breast in female, estrogen receptor negative ([place])  C50.412    Z17.1       SUMMARY OF ONCOLOGIC HISTORY: Oncology History  Malignant neoplasm of upper-outer quadrant of left breast in female, estrogen receptor negative ([place])  01/18/2019 Initial Diagnosis   Patient palpated a left breast lump for 2 weeks. Mammogram showed a 2.3cm left breast mass at the 12 o'clock position, with 1 enlarged left axillary lymph node measuring 3.5cm. 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   01/23/2019 Cancer Staging   Staging form: Breast, AJCC 8th Edition - Clinical stage from 01/23/2019: Stage IIA (cT2, cN1(f), cM0, G2, ER+, PR-, HER2+)   02/01/2019 Genetic Testing   Negative genetic testing. No pathogenic variants identified on the [facility] Common Hereditary Cancers Panel. The Common Hereditary Cancers Panel offered by [facility] includes sequencing and/or deletion duplication testing of the following 48 genes: APC, ATM, AXIN2, BARD1, BMPR1A, BRCA1, BRCA2, BRIP1, CDH1, CDKN2A (p14ARF), CDKN2A (p16INK4a), CKD4, CHEK2, CTNNA1, DICER1, EPCAM (Deletion/duplication testing only), GREM1 (promoter region deletion/duplication testing only), KIT, MEN1, MLH1, MSH2, MSH3, MSH6, MUTYH, NBN, NF1, NHTL1, PALB2, PDGFRA, PMS2, POLD1, POLE, PTEN, RAD50, RAD51C, RAD51D, RNF43, SDHB, SDHC, SDHD, SMAD4, SMARCA4. STK11, TP53, TSC1, TSC2, and VHL.  The following genes were evaluated for sequence changes  only: SDHA and HOXB13 c.251G>A variant only. The report date is 02/01/2019.    02/13/2019 - 05/28/2019 Chemotherapy   dexamethasone (DECADRON) 4 MG tablet, 4 mg (100 % of original dose 4 mg), Oral, Daily, 1 of 1 cycle, Start date: 01/23/2019, End date: 05/28/2019. Dose modification: 4 mg (original dose 4 mg, Cycle 0)  palonosetron (ALOXI) injection 0.25 mg, 0.25 mg, Intravenous,  Once, 6 of 6 cycles. Administration: 0.25 mg (02/13/2019), 0.25 mg (03/04/2019), 0.25 mg (05/07/2019), 0.25 mg (05/28/2019), 0.25 mg (03/26/2019), 0.25 mg (04/18/2019)  pegfilgrastim-jmdb (FULPHILA) injection 6 mg, 6 mg, Subcutaneous,  Once, 6 of 6 cycles. Administration: 6 mg (02/15/2019), 6 mg (03/06/2019), 6 mg (05/09/2019), 6 mg (05/30/2019), 6 mg (03/28/2019), 6 mg (04/20/2019)  CARBOplatin (PARAPLATIN) 700 mg in sodium chloride 0.9 % 250 mL chemo infusion, 700 mg (100 % of original dose 700 mg), Intravenous,  Once, 6 of 6 cycles. Dose modification: 700 mg (original dose 700 mg, Cycle 1). Administration: 700 mg (02/13/2019), 700 mg (03/04/2019), 700 mg (05/07/2019), 700 mg (05/28/2019), 700 mg (03/26/2019), 700 mg (04/18/2019)  DOCEtaxel (TAXOTERE) 140 mg in sodium chloride 0.9 % 250 mL chemo infusion, 75 mg/m2 = 140 mg, Intravenous,  Once, 6 of 6 cycles. Dose modification: 65 mg/m2 (original dose 75 mg/m2, Cycle 5, Reason: Dose not tolerated). Administration: 140 mg (02/13/2019), 140 mg (03/04/2019), 120 mg (05/07/2019), 120 mg (05/28/2019), 140 mg (03/26/2019), 140 mg (04/18/2019)  pertuzumab (PERJETA) 420 mg in sodium chloride 0.9 % 250 mL chemo infusion, 420 mg (100 % of original dose 420 mg), Intravenous, Once, 3 of 3 cycles. Dose modification: 420 mg (original dose 420 mg, Cycle 1, Reason: Provider Judgment). Administration: 420 mg (02/13/2019), 420 mg (03/04/2019), 420 mg (03/26/2019)  fosaprepitant (EMEND) 150 mg  dexamethasone (DECADRON) 12 mg in sodium chloride 0.9 % 145 mL IVPB, , Intravenous,  Once, 6 of 6 cycles. Administration:   (02/13/2019),  (03/04/2019),  (05/07/2019),  (05/28/2019),  (03/26/2019),  (04/18/2019)  trastuzumab-dkst (OGIVRI) 651 mg in sodium chloride 0.9 % 250 mL chemo infusion, 8 mg/kg = 651 mg, Intravenous,  Once, 6 of 6 cycles. Administration: 651 mg (02/13/2019), 483 mg (03/04/2019), 483 mg (05/07/2019), 483 mg (05/28/2019), 483 mg (03/26/2019), 483 mg (04/18/2019).   06/13/2019 Surgery   Left lumpectomy ([facility]) [phone]): no residual carcinoma, 2/3 left axillary lymph nodes positive for carcinoma. Lymphovascular space invasion was present.   06/20/2019 Cancer Staging   Staging form: Breast, AJCC 8th Edition - Pathologic stage from 06/20/2019: No Stage Recommended (ypT0, pN1, cM0, ER+, PR-, HER2-)   07/11/2019 Surgery   Axillary lymph node dissection ([facility]) ([id number]): 3/3 lymph nodes positive for cancer   08/19/2019 - 10/07/2019 Radiation Therapy   The patient initially received a dose of 50.4 Gy in 28 fractions to the breast and SCLV region using a 4-field approach. This was delivered using a 3-D conformal technique. The patient then received a boost to the seroma. This delivered an additional 10 Gy in 5 fractions using a 3-field photon boost technique. The total dose was 60.4 Gy.   09/2019 - 09/2029 Anti-estrogen oral therapy   Tamoxifen   04/15/2021 -  Chemotherapy   Patient is on Treatment Plan : BREAST Paclitaxel-Albumin (Abraxane) (100) D1,8,15 + Pembrolizumab (200) every 3 weeks, q28d     Liver metastases ([place])  04/19/2021 Initial Diagnosis   Liver metastases ([place])   04/19/2021 Cancer Staging   Staging form: Liver, AJCC 8th Edition - Clinical: Stage IVB (cM1) - Signed by Gardenia Phlegm, NP on 04/19/2021      CHIEF COMPLIANT: Follow-up of breast cancer  INTERVAL HISTORY: Paige Perry is a 46 y.o. with above-mentioned history of breast cancer, currently on chemotherapy with Abraxane and pembrolizumab. She presents to the clinic today for follow-up.  She reports that the itching  has gotten markedly better.  The jaundice is also getting better.  She still is extremely weak and tired.  She has noticed significant skin dryness and skin peeling especially on the neck and arms.  ALLERGIES:  is allergic to other, adhesive [tape], cefepime, latex, lovenox [enoxaparin], and tapentadol.  MEDICATIONS:  Current Outpatient Medications  Medication Sig Dispense Refill   potassium chloride (MICRO-K) 10 MEQ CR capsule Take 2 capsules (20 mEq total) by mouth daily. 60 capsule 1   antiseptic oral rinse (BIOTENE) LIQD 15 mLs by Mouth Rinse route as needed for dry mouth. 59 mL 0   feeding supplement (ENSURE ENLIVE / ENSURE PLUS) LIQD Take 237 mLs by mouth 3 (three) times daily between meals. 237 mL 12   fentaNYL (DURAGESIC) 25 MCG/HR Place 1 patch onto the skin every 3 (three) days. 10 patch 0   lidocaine (XYLOCAINE) 2 % solution Use as directed 15 mLs in the mouth or throat every 3 (three) hours as needed for mouth pain. 100 mL 0   lidocaine-prilocaine (EMLA) cream Apply to affected area once (Patient taking differently: 1 application daily as needed. Apply to affected area once) 30 g 3   magic mouthwash SOLN Take 5 mLs by mouth 3 (three) times daily as needed for mouth pain. 240 mL 1   magnesium oxide (MAG-OX) 400 (240 Mg) MG tablet Take 1 tablet (400 mg total) by mouth daily. 30 tablet 0   Multiple Vitamin (MULTIVITAMIN WITH MINERALS)  TABS tablet Take 1 tablet by mouth daily. 30 tablet 0   polyethylene glycol (MIRALAX / GLYCOLAX) 17 g packet Take 17 g by mouth daily. 14 each 0   sodium chloride (OCEAN) 0.65 % SOLN nasal spray Place 1 spray into both nostrils as needed for congestion. 15 mL 0   No current facility-administered medications for this visit.    PHYSICAL EXAMINATION: ECOG PERFORMANCE STATUS: 1 - Symptomatic but completely ambulatory  Vitals:   05/20/21 1257 05/20/21 1258  BP: (!) 88/64 (!) 91/59  Pulse: (!) 155   Resp: 18   Temp: 98.2 F (36.8 C)   SpO2: 97%     Filed Weights      LABORATORY DATA:  I have reviewed the data as listed CMP Latest Ref Rng & Units 05/13/2021 05/06/2021 04/30/2021  Glucose 70 - 99 mg/dL 132(H) 107(H) 138(H)  BUN 6 - 20 mg/dL 6 7 7   Creatinine 0.44 - 1.00 mg/dL 0.34(L) 0.41(L) 0.46  Sodium 135 - 145 mmol/L 125(L) 133(L) 131(L)  Potassium 3.5 - 5.1 mmol/L 3.2(L) 3.1(L) 3.3(L)  Chloride 98 - 111 mmol/L 89(L) 96(L) 99  CO2 22 - 32 mmol/L 29 27 26   Calcium 8.9 - 10.3 mg/dL 8.6(L) 8.9 7.6(L)  Total Protein 6.5 - 8.1 g/dL 6.7 7.0 4.8(L)  Total Bilirubin 0.3 - 1.2 mg/dL 11.1(HH) 13.0(HH) 2.9(H)  Alkaline Phos 38 - 126 U/L 640(H) 938(H) 328(H)  AST 15 - 41 U/L 147(H) 165(H) 151(H)  ALT 0 - 44 U/L 130(H) 212(H) 140(H)    Lab Results  Component Value Date   WBC 14.1 (H) 05/20/2021   HGB 9.3 (L) 05/20/2021   HCT 27.7 (L) 05/20/2021   MCV 86.6 05/20/2021   PLT 291 05/20/2021   NEUTROABS PENDING 05/20/2021    ASSESSMENT & PLAN:  Malignant neoplasm of upper-outer quadrant of left breast in female, estrogen receptor negative ([place]) 01/18/2019: Patient palpated a left breast lump for 2 weeks. Mammogram showed a 2.3cm left breast mass at the 12 o'clock position, with 1 enlarged left axillary lymph node measuring 3.5cm. 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  ER positive, PR negative, HER-2 positive (initial IHC 3+) accidentally FISH was done which was negative. T2N1 stage IIa clinical stage   Treatment Summary: 1. Neoadjuvant chemotherapy with TCH Perjeta 6 cycles (maintenance anti-HER-2 therapy is not being given because the initial pathology was reread as HER-2 negative  2. 06/13/2019 left lumpectomy ([facility]): no residual carcinoma, 2/3 left axillary lymph nodes positive for carcinoma. 07/15/2019: ALN D: 3/3 lymph nodes positive.  No extranodal extension 3. Followed by adjuvant radiation therapy started 5/25/2021CT Chest 01/25/21: No PE, Lymphadenopathy in mediastinum and hilar regions, Mass vs  LN in axillary tain METASTATIC progression: 03/23/2021: Widespread metastatic disease including the liver thoracoabdominal nodes and left chest wall/breast.  Gallbladder wall thickening, small right pleural effusion. 03/25/2021: Diagnostic mammogram showing several left breast masses concerning for malignancy the largest being 4.6 cm.   -------------------------------------------------------------------------------------------------------------------------------------------------------------------------------------------------------------------------------------- Current treatment: Abraxane pembrolizumab first dose given 04/15/2021, today cycle 2-day 15 Toxicities: 1.  Severe fatigue 2.  Hospitalization for neutropenic fever and failure to thrive 04/22/2021-04/30/2021: Reduce the dose of Abraxane to 40 mg per metered square. 3.  Mouth sores: She does not like the Magic mouthwash that we prescribed for her.     Severely elevated LFTs: Unfortunately the bilirubin has increased to 13..  I am recommending that she needs to receive Abraxane next week as well.     Prognosis: Patient fully understands  the severity of her disease.  If she can handle a few treatments we might be able to prolong her life.  The LFTs coming down are a very good sign of response.   Plan: Proceed with Abraxane today.   Tachycardia and hypotension: Persistent problems even during the hospital stay.  We had given her extensive amount of fluids in the hospital and checked her for PE and ruled that out as well.   Severe hyperbilirubinemia: Monitoring closely.  The itching has gotten significantly better she looks less jaundiced today   I believe that we need to give Abraxane every week to control her disease. She will get Keytruda every 3 weeks. Return to clinic in 1 week for follow-up    No orders of the defined types were placed in this encounter.  The patient has a good understanding of the overall plan. she agrees with  it. she will call with any problems that may develop before the next visit here.  Total time spent: 30 mins including face to face time and time spent for planning, charting and coordination of care  Rulon Eisenmenger, MD, MPH 05/20/2021  I, Thana Ates, am acting as scribe for Dr. Nicholas Lose.  I have reviewed the above documentation for accuracy and completeness, and I agree with the above.

## 2021-05-20 ENCOUNTER — Other Ambulatory Visit: Payer: Self-pay | Admitting: *Deleted

## 2021-05-20 ENCOUNTER — Inpatient Hospital Stay: Payer: BC Managed Care – PPO

## 2021-05-20 ENCOUNTER — Other Ambulatory Visit: Payer: Self-pay | Admitting: Hematology and Oncology

## 2021-05-20 ENCOUNTER — Other Ambulatory Visit: Payer: Self-pay

## 2021-05-20 ENCOUNTER — Encounter: Payer: Self-pay | Admitting: *Deleted

## 2021-05-20 ENCOUNTER — Inpatient Hospital Stay (HOSPITAL_BASED_OUTPATIENT_CLINIC_OR_DEPARTMENT_OTHER): Payer: BC Managed Care – PPO | Admitting: Hematology and Oncology

## 2021-05-20 DIAGNOSIS — Z95828 Presence of other vascular implants and grafts: Secondary | ICD-10-CM

## 2021-05-20 DIAGNOSIS — R509 Fever, unspecified: Secondary | ICD-10-CM | POA: Diagnosis not present

## 2021-05-20 DIAGNOSIS — I10 Essential (primary) hypertension: Secondary | ICD-10-CM | POA: Diagnosis present

## 2021-05-20 DIAGNOSIS — Z803 Family history of malignant neoplasm of breast: Secondary | ICD-10-CM

## 2021-05-20 DIAGNOSIS — J910 Malignant pleural effusion: Secondary | ICD-10-CM | POA: Diagnosis present

## 2021-05-20 DIAGNOSIS — Z171 Estrogen receptor negative status [ER-]: Secondary | ICD-10-CM | POA: Diagnosis not present

## 2021-05-20 DIAGNOSIS — C50412 Malignant neoplasm of upper-outer quadrant of left female breast: Secondary | ICD-10-CM

## 2021-05-20 DIAGNOSIS — R6521 Severe sepsis with septic shock: Secondary | ICD-10-CM | POA: Diagnosis present

## 2021-05-20 DIAGNOSIS — J45909 Unspecified asthma, uncomplicated: Secondary | ICD-10-CM | POA: Diagnosis present

## 2021-05-20 DIAGNOSIS — A4102 Sepsis due to Methicillin resistant Staphylococcus aureus: Principal | ICD-10-CM | POA: Diagnosis present

## 2021-05-20 DIAGNOSIS — R7401 Elevation of levels of liver transaminase levels: Secondary | ICD-10-CM | POA: Diagnosis present

## 2021-05-20 DIAGNOSIS — I76 Septic arterial embolism: Secondary | ICD-10-CM | POA: Diagnosis present

## 2021-05-20 DIAGNOSIS — Z20822 Contact with and (suspected) exposure to covid-19: Secondary | ICD-10-CM | POA: Diagnosis present

## 2021-05-20 DIAGNOSIS — R29711 NIHSS score 11: Secondary | ICD-10-CM | POA: Diagnosis not present

## 2021-05-20 DIAGNOSIS — G928 Other toxic encephalopathy: Secondary | ICD-10-CM | POA: Diagnosis present

## 2021-05-20 DIAGNOSIS — I330 Acute and subacute infective endocarditis: Secondary | ICD-10-CM | POA: Diagnosis present

## 2021-05-20 DIAGNOSIS — E876 Hypokalemia: Secondary | ICD-10-CM

## 2021-05-20 DIAGNOSIS — C787 Secondary malignant neoplasm of liver and intrahepatic bile duct: Secondary | ICD-10-CM | POA: Diagnosis present

## 2021-05-20 DIAGNOSIS — Z888 Allergy status to other drugs, medicaments and biological substances status: Secondary | ICD-10-CM

## 2021-05-20 DIAGNOSIS — R414 Neurologic neglect syndrome: Secondary | ICD-10-CM | POA: Diagnosis not present

## 2021-05-20 DIAGNOSIS — C50912 Malignant neoplasm of unspecified site of left female breast: Secondary | ICD-10-CM | POA: Diagnosis present

## 2021-05-20 DIAGNOSIS — Z9221 Personal history of antineoplastic chemotherapy: Secondary | ICD-10-CM

## 2021-05-20 DIAGNOSIS — R627 Adult failure to thrive: Secondary | ICD-10-CM | POA: Diagnosis present

## 2021-05-20 DIAGNOSIS — Z881 Allergy status to other antibiotic agents status: Secondary | ICD-10-CM

## 2021-05-20 DIAGNOSIS — Z885 Allergy status to narcotic agent status: Secondary | ICD-10-CM

## 2021-05-20 DIAGNOSIS — E871 Hypo-osmolality and hyponatremia: Secondary | ICD-10-CM | POA: Diagnosis present

## 2021-05-20 DIAGNOSIS — E7800 Pure hypercholesterolemia, unspecified: Secondary | ICD-10-CM | POA: Diagnosis present

## 2021-05-20 DIAGNOSIS — Z515 Encounter for palliative care: Secondary | ICD-10-CM

## 2021-05-20 DIAGNOSIS — R29810 Facial weakness: Secondary | ICD-10-CM | POA: Diagnosis not present

## 2021-05-20 DIAGNOSIS — Z9109 Other allergy status, other than to drugs and biological substances: Secondary | ICD-10-CM

## 2021-05-20 DIAGNOSIS — Z8249 Family history of ischemic heart disease and other diseases of the circulatory system: Secondary | ICD-10-CM

## 2021-05-20 DIAGNOSIS — Z91040 Latex allergy status: Secondary | ICD-10-CM

## 2021-05-20 DIAGNOSIS — Z1501 Genetic susceptibility to malignant neoplasm of breast: Secondary | ICD-10-CM

## 2021-05-20 DIAGNOSIS — Z634 Disappearance and death of family member: Secondary | ICD-10-CM

## 2021-05-20 DIAGNOSIS — Z170 Estrogen receptor positive status [ER+]: Secondary | ICD-10-CM

## 2021-05-20 DIAGNOSIS — D849 Immunodeficiency, unspecified: Secondary | ICD-10-CM | POA: Diagnosis present

## 2021-05-20 DIAGNOSIS — I6340 Cerebral infarction due to embolism of unspecified cerebral artery: Secondary | ICD-10-CM | POA: Diagnosis present

## 2021-05-20 DIAGNOSIS — Z79899 Other long term (current) drug therapy: Secondary | ICD-10-CM

## 2021-05-20 DIAGNOSIS — Z66 Do not resuscitate: Secondary | ICD-10-CM | POA: Diagnosis not present

## 2021-05-20 DIAGNOSIS — I471 Supraventricular tachycardia: Secondary | ICD-10-CM | POA: Diagnosis present

## 2021-05-20 DIAGNOSIS — C7989 Secondary malignant neoplasm of other specified sites: Secondary | ICD-10-CM | POA: Diagnosis present

## 2021-05-20 LAB — CBC WITH DIFFERENTIAL (CANCER CENTER ONLY)
Abs Immature Granulocytes: 0.31 10*3/uL — ABNORMAL HIGH (ref 0.00–0.07)
Basophils Absolute: 0.1 10*3/uL (ref 0.0–0.1)
Basophils Relative: 1 %
Eosinophils Absolute: 0 10*3/uL (ref 0.0–0.5)
Eosinophils Relative: 0 %
HCT: 27.7 % — ABNORMAL LOW (ref 36.0–46.0)
Hemoglobin: 9.3 g/dL — ABNORMAL LOW (ref 12.0–15.0)
Immature Granulocytes: 2 %
Lymphocytes Relative: 11 %
Lymphs Abs: 1.5 10*3/uL (ref 0.7–4.0)
MCH: 29.1 pg (ref 26.0–34.0)
MCHC: 33.6 g/dL (ref 30.0–36.0)
MCV: 86.6 fL (ref 80.0–100.0)
Monocytes Absolute: 1.9 10*3/uL — ABNORMAL HIGH (ref 0.1–1.0)
Monocytes Relative: 13 %
Neutro Abs: 10.4 10*3/uL — ABNORMAL HIGH (ref 1.7–7.7)
Neutrophils Relative %: 73 %
Platelet Count: 291 10*3/uL (ref 150–400)
RBC: 3.2 MIL/uL — ABNORMAL LOW (ref 3.87–5.11)
RDW: 18.1 % — ABNORMAL HIGH (ref 11.5–15.5)
Smear Review: NORMAL
WBC Count: 14.1 10*3/uL — ABNORMAL HIGH (ref 4.0–10.5)
nRBC: 1.3 % — ABNORMAL HIGH (ref 0.0–0.2)

## 2021-05-20 LAB — CMP (CANCER CENTER ONLY)
ALT: 243 U/L — ABNORMAL HIGH (ref 0–44)
AST: 424 U/L (ref 15–41)
Albumin: 2.8 g/dL — ABNORMAL LOW (ref 3.5–5.0)
Alkaline Phosphatase: 483 U/L — ABNORMAL HIGH (ref 38–126)
Anion gap: 8 (ref 5–15)
BUN: 7 mg/dL (ref 6–20)
CO2: 29 mmol/L (ref 22–32)
Calcium: 8.3 mg/dL — ABNORMAL LOW (ref 8.9–10.3)
Chloride: 84 mmol/L — ABNORMAL LOW (ref 98–111)
Creatinine: 0.31 mg/dL — ABNORMAL LOW (ref 0.44–1.00)
GFR, Estimated: >60 mL/min (ref >60)
Glucose, Bld: 158 mg/dL — ABNORMAL HIGH (ref 70–99)
Potassium: 2.9 mmol/L — ABNORMAL LOW (ref 3.5–5.1)
Sodium: 121 mmol/L — ABNORMAL LOW (ref 135–145)
Total Bilirubin: 5.5 mg/dL (ref 0.3–1.2)
Total Protein: 6.7 g/dL (ref 6.5–8.1)

## 2021-05-20 LAB — MISC LABCORP TEST (SEND OUT): Labcorp test code: 9985

## 2021-05-20 LAB — MAGNESIUM: Magnesium: 1.4 mg/dL — ABNORMAL LOW (ref 1.7–2.4)

## 2021-05-20 LAB — TSH: TSH: 0.863 u[IU]/mL (ref 0.308–3.960)

## 2021-05-20 MED ORDER — SODIUM CHLORIDE 0.9% FLUSH
10.0000 mL | Freq: Once | INTRAVENOUS | Status: AC
  Administered 2021-05-20: 10 mL via INTRAVENOUS

## 2021-05-20 MED ORDER — PACLITAXEL PROTEIN-BOUND CHEMO INJECTION 100 MG
40.0000 mg/m2 | Freq: Once | INTRAVENOUS | Status: AC
  Administered 2021-05-20: 75 mg via INTRAVENOUS
  Filled 2021-05-20: qty 15

## 2021-05-20 MED ORDER — SODIUM CHLORIDE 0.9 % IV SOLN: Freq: Once | INTRAVENOUS | Status: AC

## 2021-05-20 MED ORDER — HEPARIN SOD (PORK) LOCK FLUSH 100 UNIT/ML IV SOLN
500.0000 [IU] | Freq: Once | INTRAVENOUS | Status: AC
  Administered 2021-05-20: 500 [IU] via INTRAVENOUS

## 2021-05-20 MED ORDER — SODIUM CHLORIDE 0.9% FLUSH
10.0000 mL | Freq: Once | INTRAVENOUS | Status: AC
  Administered 2021-05-20: 10 mL

## 2021-05-20 NOTE — Progress Notes (Signed)
CRITICAL VALUE STICKER  CRITICAL VALUE: Total Bili 5.0 and AST 424  RECEIVER (on-site recipient of call): Merleen Nicely, [place] NOTIFIED: 05/20/21 at 67  MD NOTIFIED: Nicholas Lose, MD  TIME OF NOTIFICATION:04/19/21 at 1345  RESPONSE:  MD notified, verbalized understanding and states okay to proceed with tx today despite all abnormal labs as well as HR 155.  Verbal orders received for pt to receive 500 mL NS with pre medications today.  Orders placed in sign and held for infusion RN to release.

## 2021-05-21 ENCOUNTER — Emergency Department (HOSPITAL_COMMUNITY): Payer: BC Managed Care – PPO

## 2021-05-21 ENCOUNTER — Inpatient Hospital Stay (HOSPITAL_COMMUNITY): Payer: BC Managed Care – PPO

## 2021-05-21 ENCOUNTER — Inpatient Hospital Stay (HOSPITAL_COMMUNITY)
Admission: EM | Admit: 2021-05-21 | Discharge: 2021-05-26 | DRG: 871 | Disposition: E | Payer: BC Managed Care – PPO | Attending: Pulmonary Disease | Admitting: Pulmonary Disease

## 2021-05-21 ENCOUNTER — Telehealth: Payer: Self-pay | Admitting: *Deleted

## 2021-05-21 DIAGNOSIS — Z515 Encounter for palliative care: Secondary | ICD-10-CM

## 2021-05-21 DIAGNOSIS — Z79899 Other long term (current) drug therapy: Secondary | ICD-10-CM

## 2021-05-21 DIAGNOSIS — R6521 Severe sepsis with septic shock: Secondary | ICD-10-CM | POA: Diagnosis not present

## 2021-05-21 DIAGNOSIS — G928 Other toxic encephalopathy: Secondary | ICD-10-CM | POA: Diagnosis not present

## 2021-05-21 DIAGNOSIS — Z20822 Contact with and (suspected) exposure to covid-19: Secondary | ICD-10-CM | POA: Diagnosis not present

## 2021-05-21 DIAGNOSIS — R9089 Other abnormal findings on diagnostic imaging of central nervous system: Secondary | ICD-10-CM | POA: Diagnosis not present

## 2021-05-21 DIAGNOSIS — E876 Hypokalemia: Secondary | ICD-10-CM | POA: Diagnosis present

## 2021-05-21 DIAGNOSIS — C787 Secondary malignant neoplasm of liver and intrahepatic bile duct: Secondary | ICD-10-CM | POA: Diagnosis present

## 2021-05-21 DIAGNOSIS — E871 Hypo-osmolality and hyponatremia: Secondary | ICD-10-CM | POA: Diagnosis present

## 2021-05-21 DIAGNOSIS — R29810 Facial weakness: Secondary | ICD-10-CM | POA: Diagnosis not present

## 2021-05-21 DIAGNOSIS — Z91040 Latex allergy status: Secondary | ICD-10-CM

## 2021-05-21 DIAGNOSIS — C7989 Secondary malignant neoplasm of other specified sites: Secondary | ICD-10-CM | POA: Diagnosis not present

## 2021-05-21 DIAGNOSIS — A4102 Sepsis due to Methicillin resistant Staphylococcus aureus: Principal | ICD-10-CM | POA: Diagnosis present

## 2021-05-21 DIAGNOSIS — I6340 Cerebral infarction due to embolism of unspecified cerebral artery: Secondary | ICD-10-CM | POA: Diagnosis present

## 2021-05-21 DIAGNOSIS — R6520 Severe sepsis without septic shock: Secondary | ICD-10-CM | POA: Diagnosis not present

## 2021-05-21 DIAGNOSIS — R29711 NIHSS score 11: Secondary | ICD-10-CM | POA: Diagnosis not present

## 2021-05-21 DIAGNOSIS — Z881 Allergy status to other antibiotic agents status: Secondary | ICD-10-CM

## 2021-05-21 DIAGNOSIS — J45909 Unspecified asthma, uncomplicated: Secondary | ICD-10-CM | POA: Diagnosis present

## 2021-05-21 DIAGNOSIS — Z9109 Other allergy status, other than to drugs and biological substances: Secondary | ICD-10-CM

## 2021-05-21 DIAGNOSIS — Z170 Estrogen receptor positive status [ER+]: Secondary | ICD-10-CM | POA: Diagnosis not present

## 2021-05-21 DIAGNOSIS — Z66 Do not resuscitate: Secondary | ICD-10-CM | POA: Diagnosis not present

## 2021-05-21 DIAGNOSIS — B9562 Methicillin resistant Staphylococcus aureus infection as the cause of diseases classified elsewhere: Secondary | ICD-10-CM | POA: Diagnosis present

## 2021-05-21 DIAGNOSIS — I76 Septic arterial embolism: Secondary | ICD-10-CM | POA: Diagnosis present

## 2021-05-21 DIAGNOSIS — D849 Immunodeficiency, unspecified: Secondary | ICD-10-CM | POA: Diagnosis present

## 2021-05-21 DIAGNOSIS — R627 Adult failure to thrive: Secondary | ICD-10-CM | POA: Diagnosis present

## 2021-05-21 DIAGNOSIS — I959 Hypotension, unspecified: Secondary | ICD-10-CM

## 2021-05-21 DIAGNOSIS — C50919 Malignant neoplasm of unspecified site of unspecified female breast: Secondary | ICD-10-CM | POA: Diagnosis not present

## 2021-05-21 DIAGNOSIS — A419 Sepsis, unspecified organism: Secondary | ICD-10-CM | POA: Diagnosis not present

## 2021-05-21 DIAGNOSIS — J910 Malignant pleural effusion: Secondary | ICD-10-CM | POA: Diagnosis present

## 2021-05-21 DIAGNOSIS — Z8249 Family history of ischemic heart disease and other diseases of the circulatory system: Secondary | ICD-10-CM

## 2021-05-21 DIAGNOSIS — Z803 Family history of malignant neoplasm of breast: Secondary | ICD-10-CM

## 2021-05-21 DIAGNOSIS — I471 Supraventricular tachycardia: Secondary | ICD-10-CM | POA: Diagnosis not present

## 2021-05-21 DIAGNOSIS — Z1501 Genetic susceptibility to malignant neoplasm of breast: Secondary | ICD-10-CM

## 2021-05-21 DIAGNOSIS — Z888 Allergy status to other drugs, medicaments and biological substances status: Secondary | ICD-10-CM

## 2021-05-21 DIAGNOSIS — R414 Neurologic neglect syndrome: Secondary | ICD-10-CM | POA: Diagnosis not present

## 2021-05-21 DIAGNOSIS — R7881 Bacteremia: Secondary | ICD-10-CM | POA: Diagnosis present

## 2021-05-21 DIAGNOSIS — R7401 Elevation of levels of liver transaminase levels: Secondary | ICD-10-CM | POA: Diagnosis present

## 2021-05-21 DIAGNOSIS — Z634 Disappearance and death of family member: Secondary | ICD-10-CM

## 2021-05-21 DIAGNOSIS — I330 Acute and subacute infective endocarditis: Secondary | ICD-10-CM | POA: Diagnosis present

## 2021-05-21 DIAGNOSIS — I10 Essential (primary) hypertension: Secondary | ICD-10-CM | POA: Diagnosis present

## 2021-05-21 DIAGNOSIS — R509 Fever, unspecified: Secondary | ICD-10-CM | POA: Diagnosis present

## 2021-05-21 DIAGNOSIS — I639 Cerebral infarction, unspecified: Secondary | ICD-10-CM | POA: Diagnosis not present

## 2021-05-21 DIAGNOSIS — J90 Pleural effusion, not elsewhere classified: Secondary | ICD-10-CM | POA: Diagnosis not present

## 2021-05-21 DIAGNOSIS — Z7189 Other specified counseling: Secondary | ICD-10-CM | POA: Diagnosis not present

## 2021-05-21 DIAGNOSIS — E7800 Pure hypercholesterolemia, unspecified: Secondary | ICD-10-CM | POA: Diagnosis present

## 2021-05-21 DIAGNOSIS — C50912 Malignant neoplasm of unspecified site of left female breast: Secondary | ICD-10-CM | POA: Diagnosis present

## 2021-05-21 DIAGNOSIS — Z9221 Personal history of antineoplastic chemotherapy: Secondary | ICD-10-CM

## 2021-05-21 DIAGNOSIS — I6389 Other cerebral infarction: Secondary | ICD-10-CM | POA: Diagnosis not present

## 2021-05-21 DIAGNOSIS — R000 Tachycardia, unspecified: Secondary | ICD-10-CM

## 2021-05-21 DIAGNOSIS — Z885 Allergy status to narcotic agent status: Secondary | ICD-10-CM

## 2021-05-21 LAB — CBC WITH DIFFERENTIAL/PLATELET
Abs Immature Granulocytes: 0 10*3/uL (ref 0.00–0.07)
Basophils Absolute: 0 10*3/uL (ref 0.0–0.1)
Basophils Relative: 0 %
Eosinophils Absolute: 0 10*3/uL (ref 0.0–0.5)
Eosinophils Relative: 0 %
HCT: 26.1 % — ABNORMAL LOW (ref 36.0–46.0)
Hemoglobin: 8.6 g/dL — ABNORMAL LOW (ref 12.0–15.0)
Lymphocytes Relative: 8 %
Lymphs Abs: 1 10*3/uL (ref 0.7–4.0)
MCH: 28.8 pg (ref 26.0–34.0)
MCHC: 33 g/dL (ref 30.0–36.0)
MCV: 87.3 fL (ref 80.0–100.0)
Monocytes Absolute: 0 10*3/uL — ABNORMAL LOW (ref 0.1–1.0)
Monocytes Relative: 0 %
Neutro Abs: 11.3 10*3/uL — ABNORMAL HIGH (ref 1.7–7.7)
Neutrophils Relative %: 92 %
Platelets: 194 10*3/uL (ref 150–400)
RBC: 2.99 MIL/uL — ABNORMAL LOW (ref 3.87–5.11)
RDW: 18.1 % — ABNORMAL HIGH (ref 11.5–15.5)
WBC: 12.3 10*3/uL — ABNORMAL HIGH (ref 4.0–10.5)
nRBC: 0.4 % — ABNORMAL HIGH (ref 0.0–0.2)

## 2021-05-21 LAB — URINALYSIS, ROUTINE W REFLEX MICROSCOPIC
Bilirubin Urine: NEGATIVE
Glucose, UA: NEGATIVE mg/dL
Ketones, ur: 20 mg/dL — AB
Leukocytes,Ua: NEGATIVE
Nitrite: NEGATIVE
Protein, ur: 30 mg/dL — AB
Specific Gravity, Urine: 1.011 (ref 1.005–1.030)
pH: 6 (ref 5.0–8.0)

## 2021-05-21 LAB — LACTIC ACID, PLASMA
Lactic Acid, Venous: 2.1 mmol/L (ref 0.5–1.9)
Lactic Acid, Venous: 1.8 mmol/L (ref 0.5–1.9)

## 2021-05-21 LAB — RESP PANEL BY RT-PCR (FLU A&B, COVID) ARPGX2
Influenza A by PCR: NEGATIVE
Influenza B by PCR: NEGATIVE
SARS Coronavirus 2 by RT PCR: NEGATIVE

## 2021-05-21 LAB — COMPREHENSIVE METABOLIC PANEL
ALT: 216 U/L — ABNORMAL HIGH (ref 0–44)
AST: 403 U/L — ABNORMAL HIGH (ref 15–41)
Albumin: 1.9 g/dL — ABNORMAL LOW (ref 3.5–5.0)
Alkaline Phosphatase: 361 U/L — ABNORMAL HIGH (ref 38–126)
Anion gap: 11 (ref 5–15)
BUN: 6 mg/dL (ref 6–20)
CO2: 26 mmol/L (ref 22–32)
Calcium: 7.6 mg/dL — ABNORMAL LOW (ref 8.9–10.3)
Chloride: 84 mmol/L — ABNORMAL LOW (ref 98–111)
Creatinine, Ser: 0.42 mg/dL — ABNORMAL LOW (ref 0.44–1.00)
GFR, Estimated: >60 mL/min (ref >60)
Glucose, Bld: 112 mg/dL — ABNORMAL HIGH (ref 70–99)
Potassium: 2.5 mmol/L — CL (ref 3.5–5.1)
Sodium: 121 mmol/L — ABNORMAL LOW (ref 135–145)
Total Bilirubin: 4.8 mg/dL — ABNORMAL HIGH (ref 0.3–1.2)
Total Protein: 6.1 g/dL — ABNORMAL LOW (ref 6.5–8.1)

## 2021-05-21 LAB — CREATININE, SERUM: Creatinine, Ser: <0.3 mg/dL — ABNORMAL LOW (ref 0.44–1.00)

## 2021-05-21 LAB — MAGNESIUM: Magnesium: 1.6 mg/dL — ABNORMAL LOW (ref 1.7–2.4)

## 2021-05-21 LAB — I-STAT BETA HCG BLOOD, ED (MC, WL, AP ONLY): I-stat hCG, quantitative: <5 m[IU]/mL (ref <5)

## 2021-05-21 LAB — PROTIME-INR
INR: 1.6 — ABNORMAL HIGH (ref 0.8–1.2)
Prothrombin Time: 18.6 seconds — ABNORMAL HIGH (ref 11.4–15.2)

## 2021-05-21 LAB — CBC
HCT: 24.5 % — ABNORMAL LOW (ref 36.0–46.0)
Hemoglobin: 8.3 g/dL — ABNORMAL LOW (ref 12.0–15.0)
MCH: 29.5 pg (ref 26.0–34.0)
MCHC: 33.9 g/dL (ref 30.0–36.0)
MCV: 87.2 fL (ref 80.0–100.0)
Platelets: 158 10*3/uL (ref 150–400)
RBC: 2.81 MIL/uL — ABNORMAL LOW (ref 3.87–5.11)
RDW: 17.9 % — ABNORMAL HIGH (ref 11.5–15.5)
WBC: 10.6 10*3/uL — ABNORMAL HIGH (ref 4.0–10.5)
nRBC: 0.6 % — ABNORMAL HIGH (ref 0.0–0.2)

## 2021-05-21 LAB — GLUCOSE, CAPILLARY
Glucose-Capillary: 100 mg/dL — ABNORMAL HIGH (ref 70–99)
Glucose-Capillary: 111 mg/dL — ABNORMAL HIGH (ref 70–99)

## 2021-05-21 LAB — APTT: aPTT: 46 seconds — ABNORMAL HIGH (ref 24–36)

## 2021-05-21 LAB — MRSA NEXT GEN BY PCR, NASAL: MRSA by PCR Next Gen: NOT DETECTED

## 2021-05-21 LAB — T4: T4, Total: 7.1 ug/dL (ref 4.5–12.0)

## 2021-05-21 IMAGING — CT CT HEAD W/O CM
3 series · 15 of 46 positions shown, 18 images · non-contrast
Comparison: None.

CLINICAL DATA: Brain metastases suspected



[Series 2: head wo · axial · 0.41mm/px · z∈[-128,-8]mm · 9 of 29 slices shown, 12 images]
[im 3/29  brain]
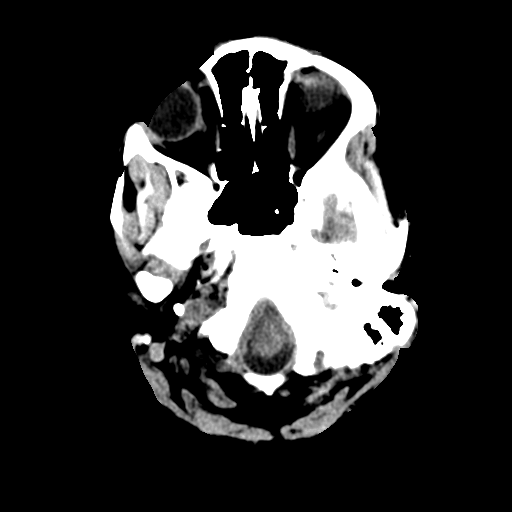
[im 3/29  bone]
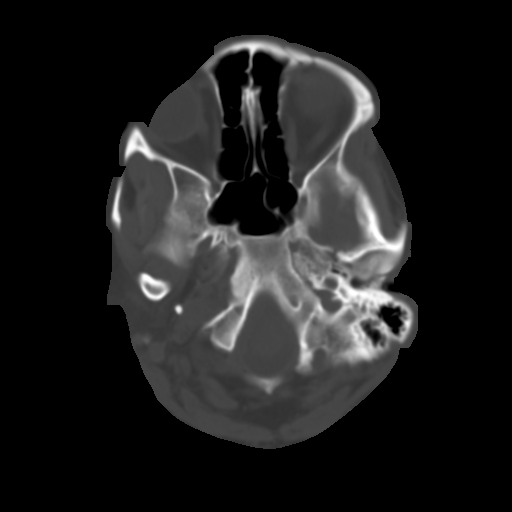
[im 6/29  brain]
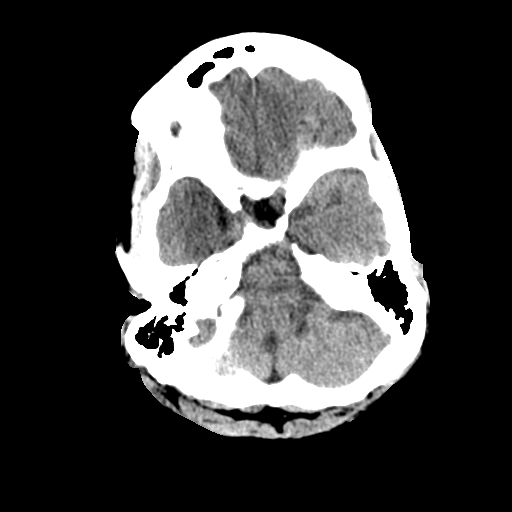
[im 9/29  brain]
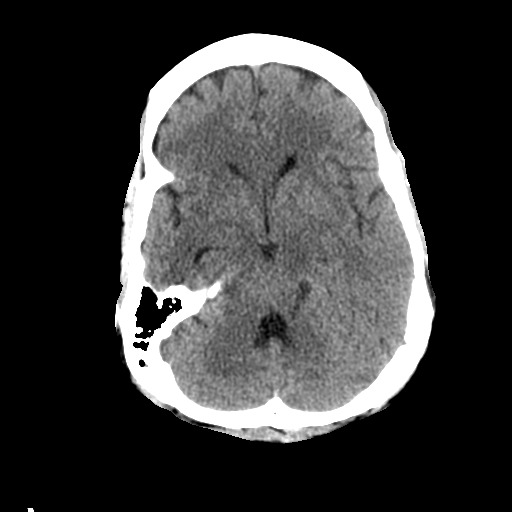
[im 12/29  brain]
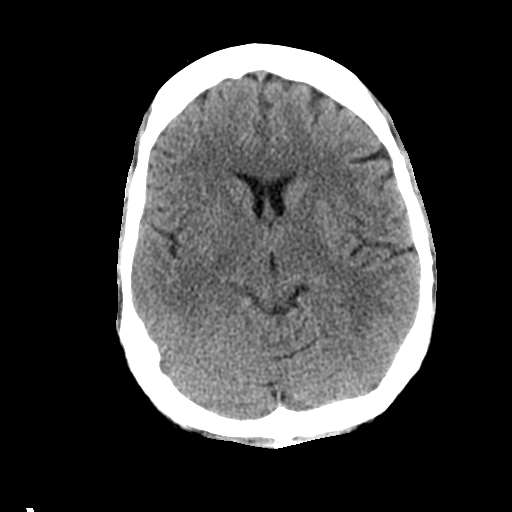
[im 15/29  brain]
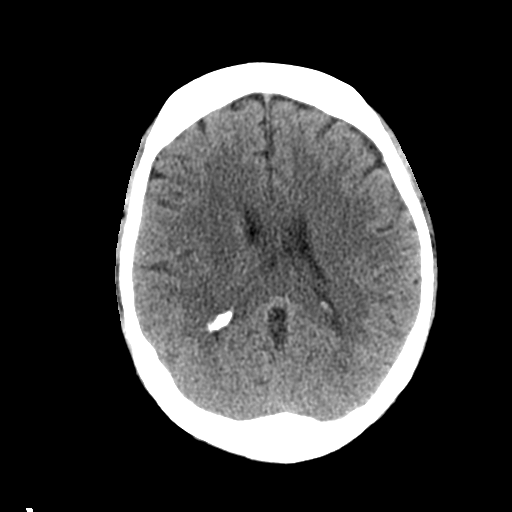
[im 15/29  bone]
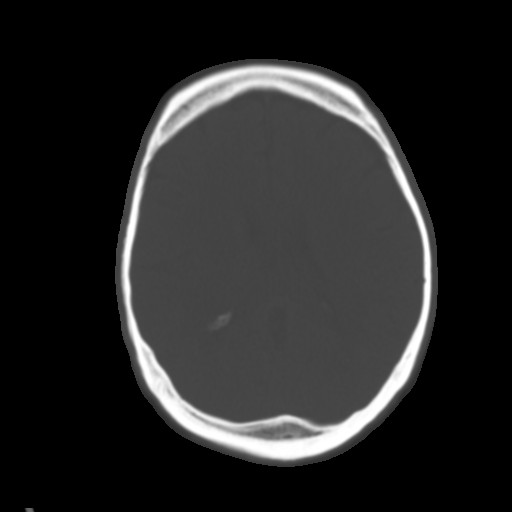
[im 18/29  brain]
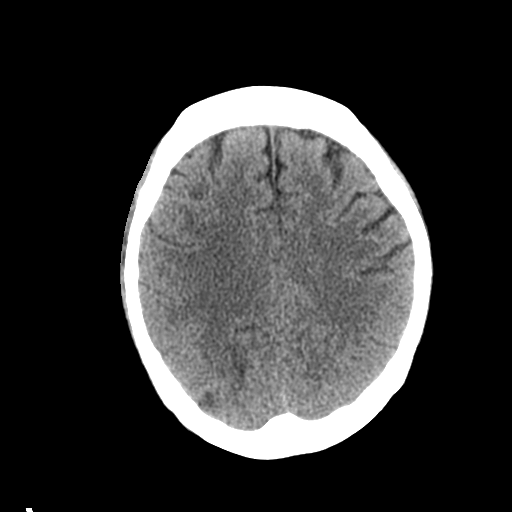
[im 21/29  brain]
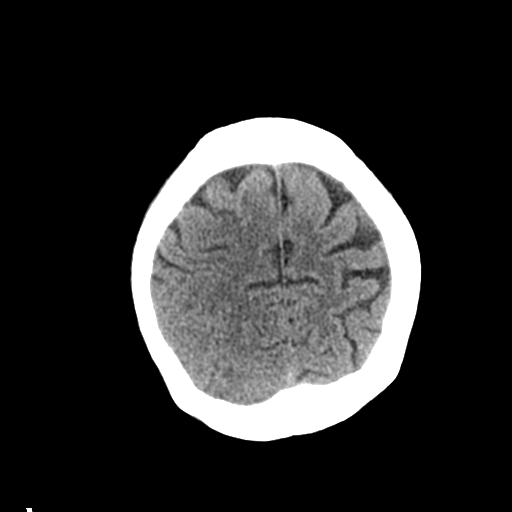
[im 24/29  brain]
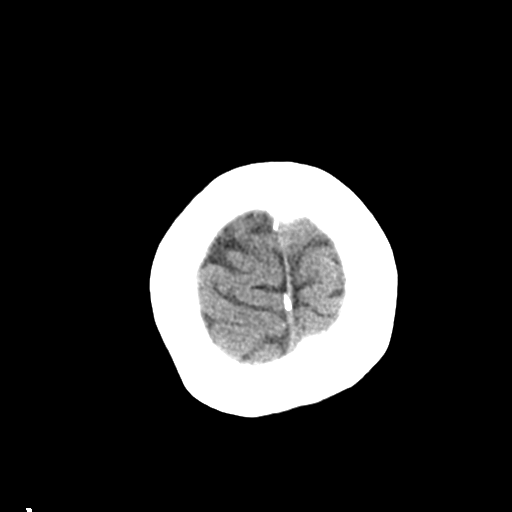
[im 27/29  brain]
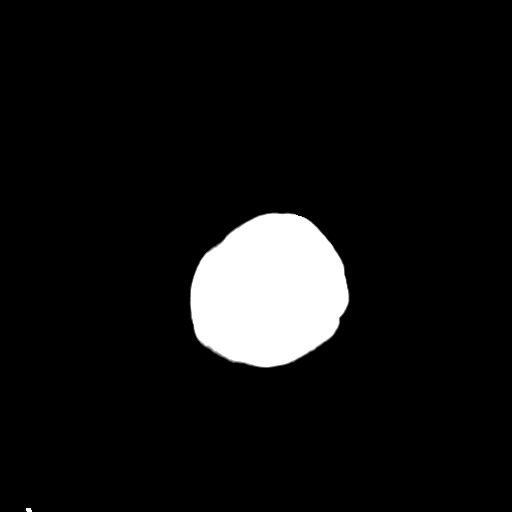
[im 27/29  bone]
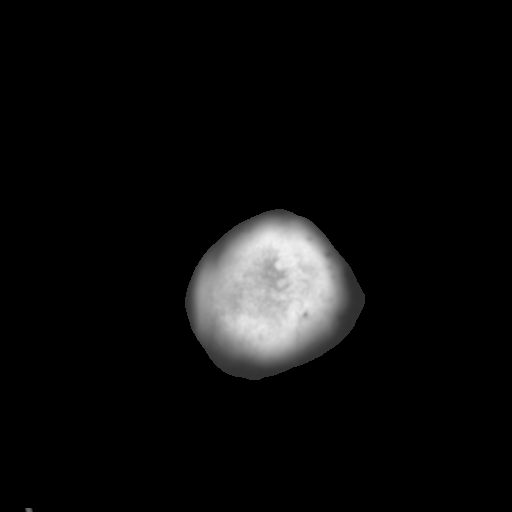

[Series 4: coronal soft tissue · coronal · 0.28mm/px · 3 of 66 slices shown]
[im 22/66  brain]
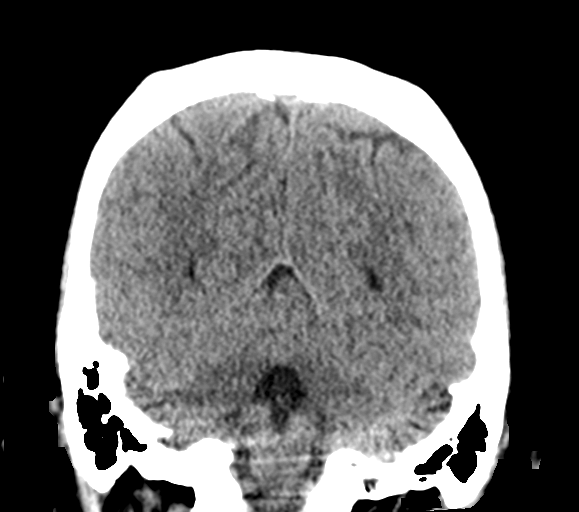
[im 29/66  brain]
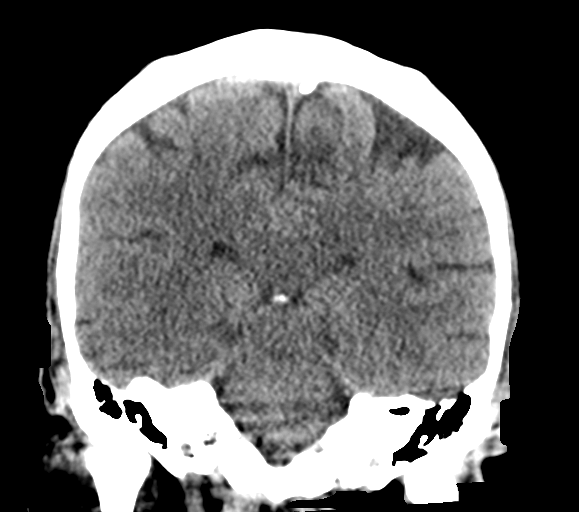
[im 37/66  brain]
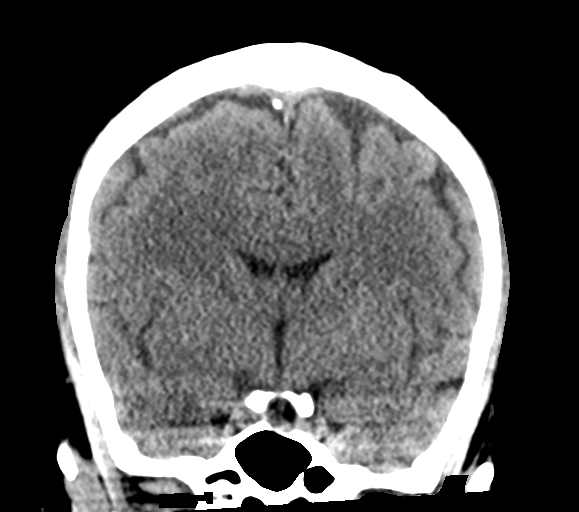

[Series 5: sagittal soft tissue · sagittal · 0.28mm/px · 3 of 53 slices shown]
[im 18/53  brain]
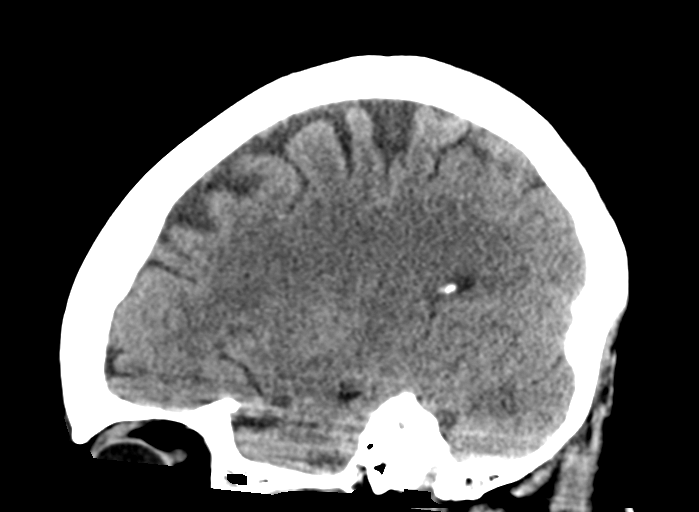
[im 27/53  brain]
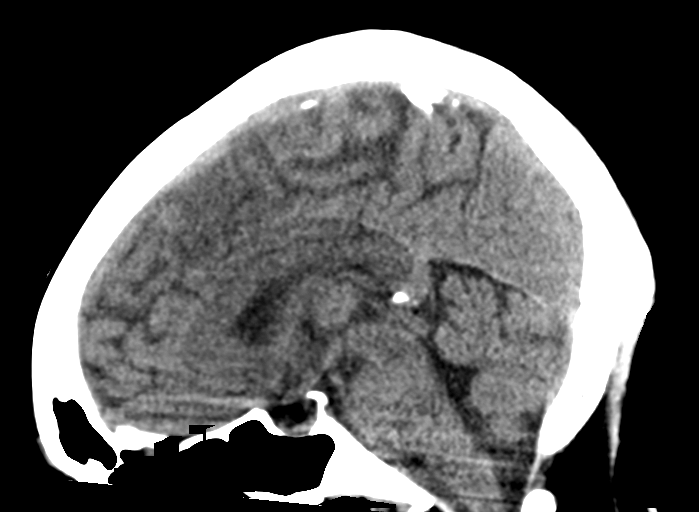
[im 35/53  brain]
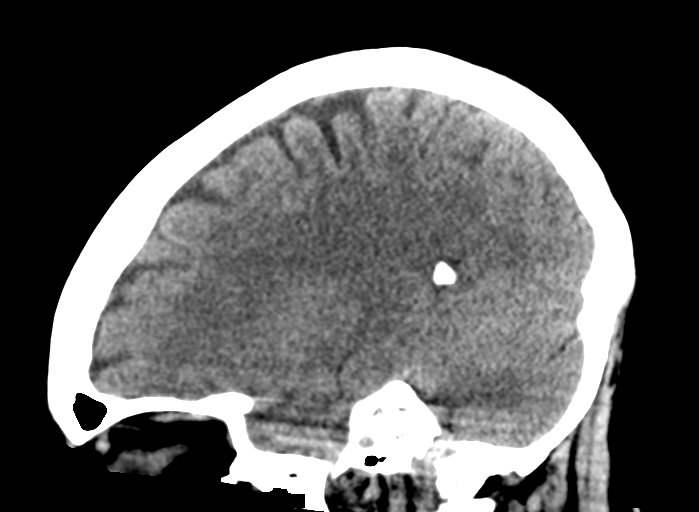

[15 of 46 positions shown; findings below may reference images not displayed]

FINDINGS: Brain: No evidence of acute intracranial hemorrhage or extra-axial
collection.There is focal hypoattenuation and loss of gray-white
matter differentiation within the right parieto-occipital region. No
concerning mass effect. The ventricles are normal in size.

Vascular: No hyperdense vessel or unexpected calcification.

Skull: Negative for fracture.

Sinuses/Orbits: Rightward gaze of the right eye. The lens is not
within the field of view of the left eye. The orbits are otherwise
unremarkable. Paranasal sinuses are predominantly clear.

Other: None.
IMPRESSION: Focal hypoattenuation in the right parieto-occipital region favored
to represent acute infarct. Given history of malignancy, an
underlying metastasis is possible. Recommend MRI.

Critical Value/emergent results were called by telephone at the time
of interpretation on [DATE] at [DATE] to provider JUMPER ,
who verbally acknowledged these results.

## 2021-05-21 IMAGING — DX DG CHEST 1V PORT
1 series · 1 of 1 positions shown · non-contrast
Comparison: [DATE]

CLINICAL DATA: Questionable sepsis - evaluate for abnormality

EXAM:
PORTABLE CHEST 1 VIEW

[chest ap]
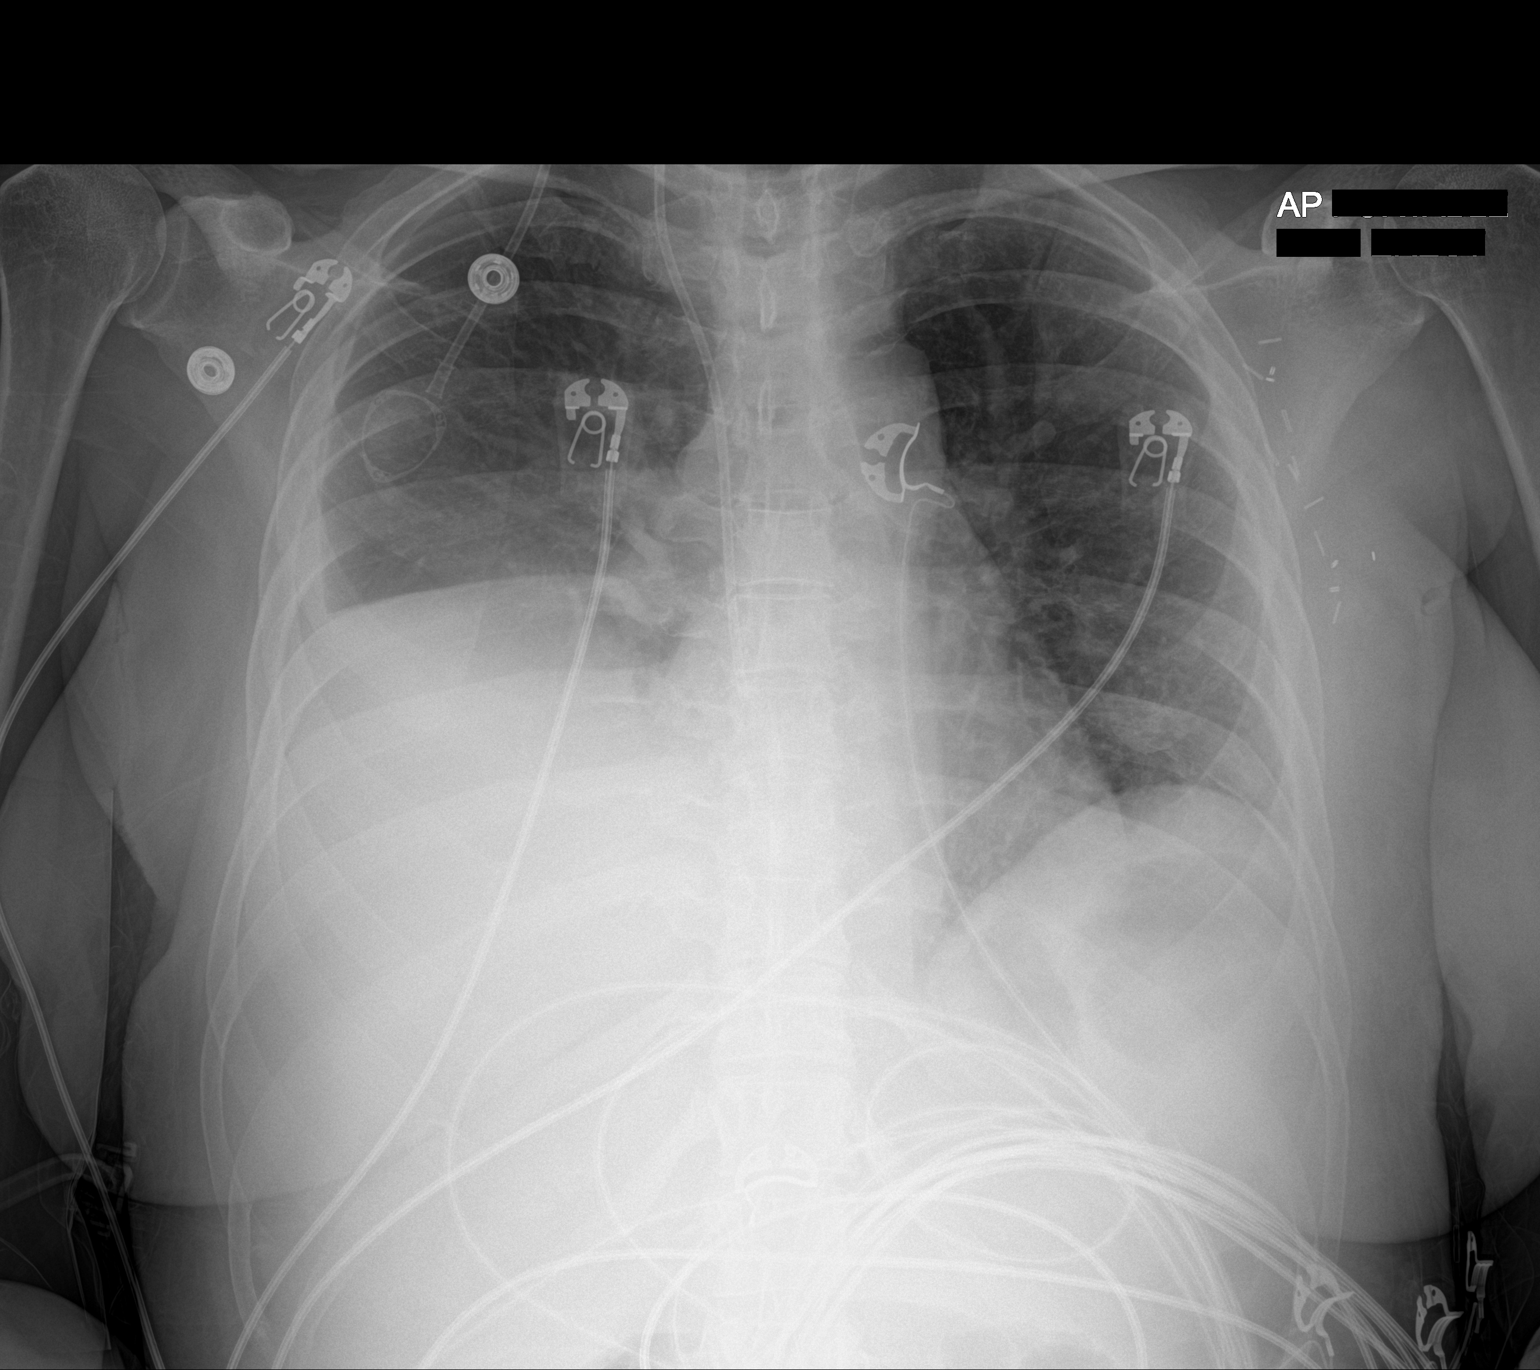

[1 of 1 positions shown; findings below may reference images not displayed]

FINDINGS: Right-sided chest port remains in place. Normal heart size. Moderate
right-sided pleural effusion, increased from prior. Slightly
prominent bilateral interstitial markings. No left-sided pleural
effusion. No pneumothorax. Surgical clips in the left axilla.
IMPRESSION: 1. Moderate right-sided pleural effusion, increased from prior.
2. Slightly prominent bilateral interstitial markings, possibly mild
edema.

## 2021-05-21 IMAGING — CT CT ANGIO HEAD-NECK (W OR W/O PERF)
2 of 7 series · 8 of 33 positions shown · non-contrast
Comparison: Same day CT head.

CLINICAL DATA: Neuro deficit, acute, stroke suspected

EXAM:
CT ANGIOGRAPHY HEAD AND NECK
TECHNIQUE: Multidetector CT imaging of the head and neck was performed using
the standard protocol during bolus administration of intravenous
contrast. Multiplanar CT image reconstructions and MIPs were
obtained to evaluate the vascular anatomy. Carotid stenosis
measurements (when applicable) are obtained utilizing NASCET
criteria, using the distal internal carotid diameter as the
denominator.

[Series 6: cta head neck · axial · 0.42mm/px · z∈[-240,-120]mm · 2 of 180 slices shown]
[im 60/180  soft-tissue]
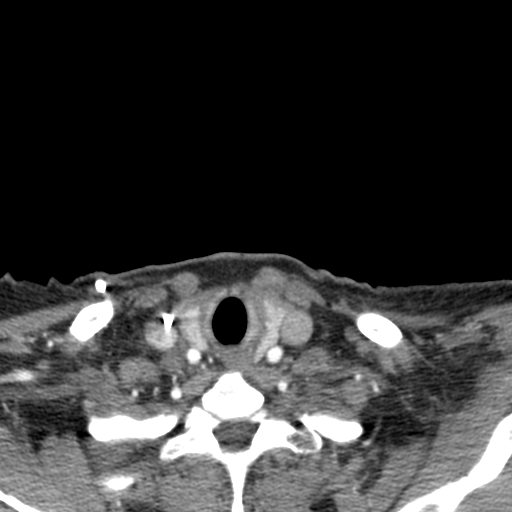
[im 120/180  soft-tissue]
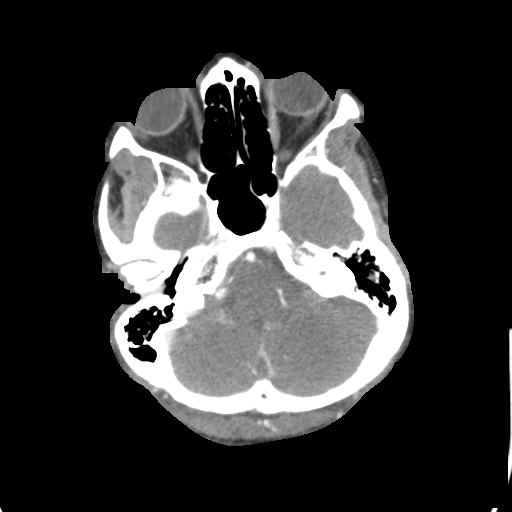

[Series 8: ax thin · axial · 0.39mm/px · z∈[-306,-50]mm · 6 of 358 slices shown]
[im 52/358  soft-tissue]
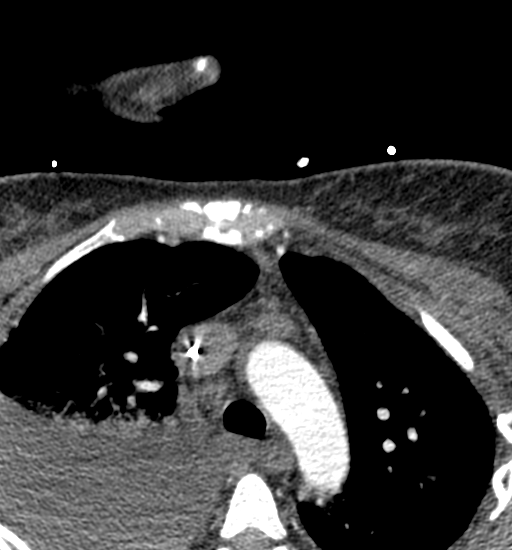
[im 103/358  bone]
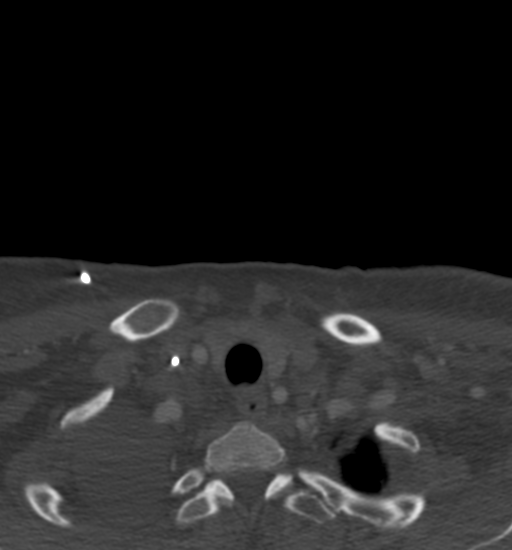
[im 154/358  soft-tissue]
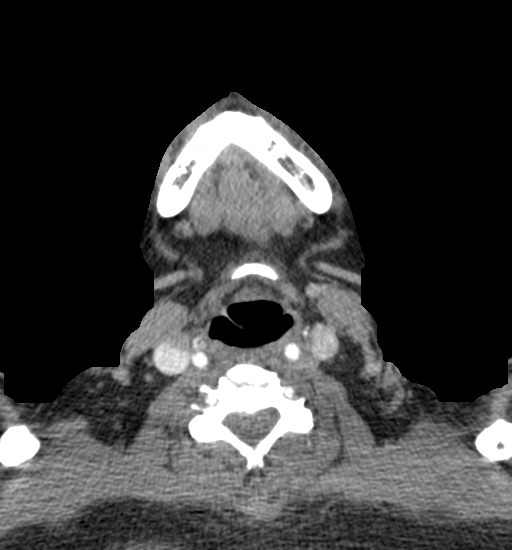
[im 205/358  bone]
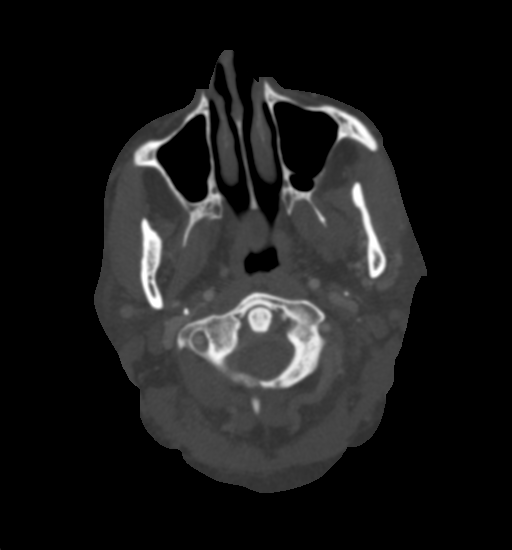
[im 256/358  soft-tissue]
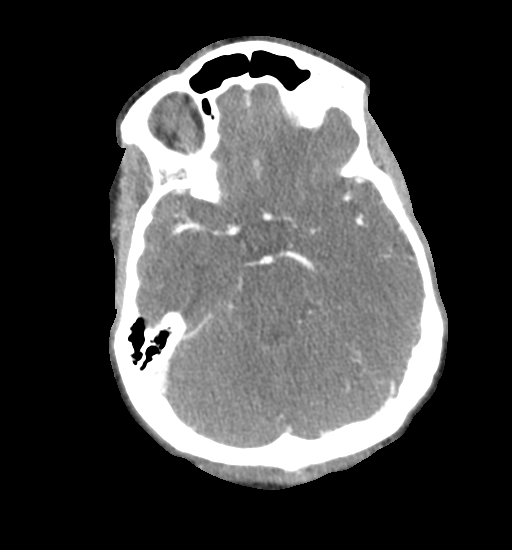
[im 307/358  bone]
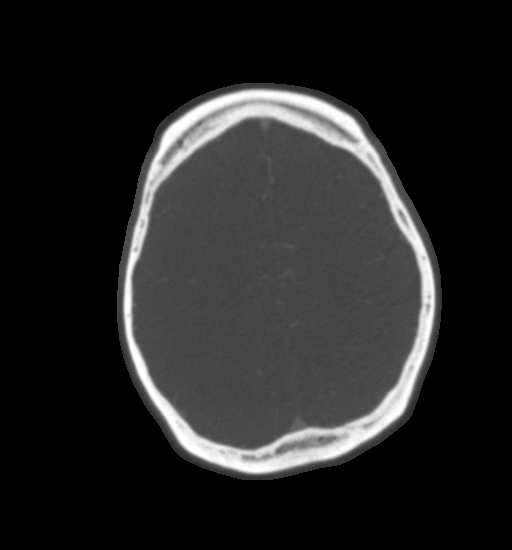

[8 of 33 positions shown; findings below may reference images not displayed]

RADIATION DOSE REDUCTION: This exam was performed according to the
departmental dose-optimization program which includes automated
exposure control, adjustment of the mA and/or kV according to
patient size and/or use of iterative reconstruction technique.

CONTRAST:  100mL OMNIPAQUE IOHEXOL 350 MG/ML SOLN
FINDINGS: CTA NECK FINDINGS

Aortic arch: Great vessel origins are patent without significant
stenosis.

Right carotid system: No evidence of dissection, stenosis (50% or
greater) or occlusion.

Left carotid system: No evidence of dissection, stenosis (50% or
greater) or occlusion. Mild narrowing of the ICA origin without
greater than 50% stenosis.

Vertebral arteries: Mildly right dominant. No evidence of
dissection, stenosis (50% or greater) or occlusion.

Skeleton: Moderate multilevel degenerative change in the cervical
spine.

Other neck: No evidence of acute abnormality on limited assessment.

Upper chest: Large right pleural effusion with overlying atelectasis
or consolidation, partially imaged.

Review of the MIP images confirms the above findings

CTA HEAD FINDINGS

Limited by venous contrast timing and mild motion. Within this
limitation:

Anterior circulation: Bilateral intracranial ICAs, MCAs, and ACAs
are patent without proximal hemodynamically significant stenosis. No
aneurysm identified.

Posterior circulation: Bilateral intradural vertebral arteries,
basilar artery, and posterior cerebral arteries are patent without
proximal hemodynamically significant stenosis. No aneurysm
identified.

Venous sinuses: As permitted by contrast timing, patent.

Review of the MIP images confirms the above findings
IMPRESSION: 1. No large vessel occlusion or proximal hemodynamically significant
stenosis in the head or neck.
2. Partially imaged large right pleural effusion with overlying
atelectasis or consolidation.

## 2021-05-21 MED ORDER — MAGNESIUM SULFATE 4 GM/100ML IV SOLN
4.0000 g | Freq: Once | INTRAVENOUS | Status: AC
  Administered 2021-05-21: 4 g via INTRAVENOUS
  Filled 2021-05-21: qty 100

## 2021-05-21 MED ORDER — NOREPINEPHRINE 4 MG/250ML-% IV SOLN
INTRAVENOUS | Status: AC
  Administered 2021-05-21: 2 ug/min via INTRAVENOUS
  Filled 2021-05-21: qty 250

## 2021-05-21 MED ORDER — DOCUSATE SODIUM 100 MG PO CAPS: 100.0000 mg | ORAL_CAPSULE | Freq: Two times a day (BID) | ORAL | Status: DC | PRN

## 2021-05-21 MED ORDER — POLYETHYLENE GLYCOL 3350 17 G PO PACK: 17.0000 g | PACK | Freq: Every day | ORAL | Status: DC | PRN

## 2021-05-21 MED ORDER — POTASSIUM CHLORIDE 10 MEQ/100ML IV SOLN
10.0000 meq | Freq: Once | INTRAVENOUS | Status: AC
  Administered 2021-05-21: 10 meq via INTRAVENOUS
  Filled 2021-05-21: qty 100

## 2021-05-21 MED ORDER — NOREPINEPHRINE 4 MG/250ML-% IV SOLN
0.0000 ug/min | INTRAVENOUS | Status: DC
  Filled 2021-05-21: qty 250

## 2021-05-21 MED ORDER — CHLORHEXIDINE GLUCONATE CLOTH 2 % EX PADS
6.0000 | MEDICATED_PAD | Freq: Every day | CUTANEOUS | Status: DC
  Administered 2021-05-21: 6 via TOPICAL

## 2021-05-21 MED ORDER — LACTATED RINGERS IV SOLN: INTRAVENOUS | Status: DC

## 2021-05-21 MED ORDER — SODIUM CHLORIDE 0.9 % IV SOLN
0.0000 ug/min | INTRAVENOUS | Status: DC
  Filled 2021-05-21 (×2): qty 2

## 2021-05-21 MED ORDER — SODIUM CHLORIDE 0.9 % IV SOLN
INTRAVENOUS | Status: DC | PRN
Start: 2021-05-21 — End: 2021-05-22

## 2021-05-21 MED ORDER — IOHEXOL 350 MG/ML SOLN
80.0000 mL | Freq: Once | INTRAVENOUS | Status: AC | PRN
  Administered 2021-05-21: 100 mL via INTRAVENOUS

## 2021-05-21 MED ORDER — VANCOMYCIN HCL 1250 MG/250ML IV SOLN
1250.0000 mg | Freq: Once | INTRAVENOUS | Status: AC
  Administered 2021-05-21: 1250 mg via INTRAVENOUS
  Filled 2021-05-21: qty 250

## 2021-05-21 MED ORDER — SODIUM CHLORIDE 0.9 % IV SOLN
2.0000 g | Freq: Once | INTRAVENOUS | Status: AC
  Administered 2021-05-21: 2 g via INTRAVENOUS
  Filled 2021-05-21: qty 2

## 2021-05-21 MED ORDER — LACTATED RINGERS IV BOLUS (SEPSIS)
250.0000 mL | Freq: Once | INTRAVENOUS | Status: AC
  Administered 2021-05-21: 250 mL via INTRAVENOUS

## 2021-05-21 MED ORDER — ACETAMINOPHEN 325 MG PO TABS
650.0000 mg | ORAL_TABLET | Freq: Four times a day (QID) | ORAL | Status: DC | PRN
  Administered 2021-05-22 (×2): 650 mg via ORAL
  Filled 2021-05-21 (×2): qty 2

## 2021-05-21 MED ORDER — LORAZEPAM 2 MG/ML IJ SOLN
2.0000 mg | Freq: Once | INTRAMUSCULAR | Status: AC
  Administered 2021-05-21: 2 mg via INTRAVENOUS
  Filled 2021-05-21: qty 1

## 2021-05-21 MED ORDER — LEVETIRACETAM IN NACL 1500 MG/100ML IV SOLN
1500.0000 mg | INTRAVENOUS | Status: AC
  Administered 2021-05-21: 1500 mg via INTRAVENOUS
  Filled 2021-05-21: qty 100

## 2021-05-21 MED ORDER — LACTATED RINGERS IV BOLUS (SEPSIS)
1000.0000 mL | Freq: Once | INTRAVENOUS | Status: AC
  Administered 2021-05-21: 1000 mL via INTRAVENOUS

## 2021-05-21 MED ORDER — SODIUM CHLORIDE 0.9 % IV SOLN
2.0000 g | Freq: Three times a day (TID) | INTRAVENOUS | Status: DC
  Administered 2021-05-21 – 2021-05-22 (×2): 2 g via INTRAVENOUS
  Filled 2021-05-21 (×3): qty 2

## 2021-05-21 MED ORDER — VANCOMYCIN HCL IN DEXTROSE 1-5 GM/200ML-% IV SOLN: 1000.0000 mg | Freq: Once | INTRAVENOUS | Status: DC

## 2021-05-21 MED ORDER — SODIUM CHLORIDE (PF) 0.9 % IJ SOLN
INTRAMUSCULAR | Status: AC
Start: 2021-05-21 — End: 2021-05-22
  Filled 2021-05-21: qty 50

## 2021-05-21 MED ORDER — PHENYLEPHRINE HCL-NACL 20-0.9 MG/250ML-% IV SOLN
0.0000 ug/min | INTRAVENOUS | Status: DC
  Administered 2021-05-21: 300 ug/min via INTRAVENOUS
  Administered 2021-05-21: 400 ug/min via INTRAVENOUS
  Administered 2021-05-22: 160 ug/min via INTRAVENOUS
  Administered 2021-05-22: 100 ug/min via INTRAVENOUS
  Administered 2021-05-22: 240 ug/min via INTRAVENOUS
  Administered 2021-05-22: 400 ug/min via INTRAVENOUS
  Administered 2021-05-22: 240 ug/min via INTRAVENOUS
  Filled 2021-05-21 (×10): qty 250

## 2021-05-21 MED ORDER — SODIUM CHLORIDE (PF) 0.9 % IJ SOLN
INTRAMUSCULAR | Status: AC
  Filled 2021-05-21: qty 50

## 2021-05-21 MED ORDER — POTASSIUM CHLORIDE 10 MEQ/100ML IV SOLN
10.0000 meq | INTRAVENOUS | Status: AC
  Administered 2021-05-21 (×4): 10 meq via INTRAVENOUS
  Filled 2021-05-21 (×5): qty 100

## 2021-05-21 MED ORDER — VASOPRESSIN 20 UNITS/100 ML INFUSION FOR SHOCK
0.0000 [IU]/min | INTRAVENOUS | Status: DC
  Administered 2021-05-21: 0.03 [IU]/min via INTRAVENOUS
  Filled 2021-05-21 (×3): qty 100

## 2021-05-21 MED ORDER — NOREPINEPHRINE 4 MG/250ML-% IV SOLN
INTRAVENOUS | Status: AC
  Filled 2021-05-21: qty 250

## 2021-05-21 MED ORDER — VANCOMYCIN HCL IN DEXTROSE 1-5 GM/200ML-% IV SOLN: 1000.0000 mg | Freq: Two times a day (BID) | INTRAVENOUS | Status: DC

## 2021-05-21 MED ORDER — METRONIDAZOLE 500 MG/100ML IV SOLN
500.0000 mg | Freq: Once | INTRAVENOUS | Status: AC
  Administered 2021-05-21: 500 mg via INTRAVENOUS
  Filled 2021-05-21: qty 100

## 2021-05-21 MED ORDER — VANCOMYCIN HCL IN DEXTROSE 1-5 GM/200ML-% IV SOLN
1000.0000 mg | Freq: Two times a day (BID) | INTRAVENOUS | Status: DC
  Administered 2021-05-22: 1000 mg via INTRAVENOUS
  Filled 2021-05-21 (×3): qty 200

## 2021-05-21 MED ORDER — ADENOSINE 6 MG/2ML IV SOLN
6.0000 mg | Freq: Once | INTRAVENOUS | Status: AC
  Administered 2021-05-21: 6 mg via INTRAVENOUS
  Filled 2021-05-21: qty 2

## 2021-05-21 MED ORDER — HEPARIN SODIUM (PORCINE) 5000 UNIT/ML IJ SOLN
5000.0000 [IU] | Freq: Three times a day (TID) | INTRAMUSCULAR | Status: DC
  Administered 2021-05-21 – 2021-05-22 (×2): 5000 [IU] via SUBCUTANEOUS
  Filled 2021-05-21 (×2): qty 1

## 2021-05-21 MED ORDER — ACETAMINOPHEN 325 MG PO TABS
650.0000 mg | ORAL_TABLET | Freq: Once | ORAL | Status: AC
  Administered 2021-05-21: 650 mg via ORAL
  Filled 2021-05-21: qty 2

## 2021-05-21 NOTE — H&P (Addendum)
NAME:  Paige Perry, MRN:  [id number], DOB:  [birth year], LOS: 0 ADMISSION DATE:  05/01/2021, CONSULTATION DATE:  05/20/2021  REFERRING MD:  Jeanell Sparrow, EDP , CHIEF COMPLAINT: Fever and tachycardia  History of Present Illness:  46 year old with metastatic breast cancer to liver, recent hospitalization 1/26 to 2/3 for neutropenic fever and failure to thrive, on chemotherapy with Keytruda and Abraxane , persistent tachycardia , BIBEMS for fever 101.5, tachycardia 150s, hypotensive with systolic blood pressure in 80s. Initial labs showed hypokalemia 2.9, hyponatremia 121, elevated bilirubin 5.5, leukocytosis 14.1 K She was noted to have a right gaze preference, CT head suggestive of acute occipital CVA versus metastasis   Pertinent  Medical History  Breast cancer, initially diagnosed 2020, ER positive, HER2 positive, status post neoadjuvant chemotherapy, left lumpectomy, RT, presented again 12/2020 with metastases to liver, left chest wall, breast  2/1 thoracenteses >> malignant cells  Significant Hospital Events: Including procedures, antibiotic start and stop dates in addition to other pertinent events   1/31 diffuse erythematous rash?  Cefepime induced , oral ulcerations treated with Diflucan , treated with dexamethasone for?  Drug-induced pneumonitis-new groundglass opacities in left upper lobe and lower lobe 2/1 right thoracentesis >> malignant cells  Interim History / Subjective:  Appears critically ill, tachycardic 150s  Objective   Blood pressure 106/71, pulse (!) 156, temperature (!) 102.5 F (39.2 C), resp. rate 18, SpO2 98 %.       No intake or output data in the 24 hours ending 04/29/2021 1536 There were no vitals filed for this visit.  Examination: General: Adult woman appears older than stated age, moderate distress HENT: Mild icterus, pallor present, moist mucosa Lungs: Bilateral breath sounds clear, no accessory muscle use Cardiovascular: S1-S2 tacky, regular Abdomen: Soft,  nontender Extremities: 2+ bipedal edema Neuro: Right gaze preference, follows commands, oriented x2, not to place, power 4/5 both upper extremities, 5/5 both lower extremities GU: Clear urine Skin diffuse scaling rash over trunk upper arms and thigh  Chest x-ray dependently reviewed shows moderate right effusion, mild edema,  Resolved Hospital Problem list     Assessment & Plan:  Presumed sepsis, unclear source. 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  For this hospitalization, at this time, we may be dealing with reversible causes, hence we will keep full [place] (right click and "Reselect all SmartList Selections" daily)   Diet/type: NPO DVT prophylaxis: prophylactic heparin  GI prophylaxis: N/A Lines: N/A Foley:  N/A Code Status:  full code Last date of multidisciplinary goals of care discussion [sister Vicky at bedside]  Labs   CBC: Recent Labs  Lab 05/20/21 1249 05/14/2021 1315  WBC 14.1* 12.3*  NEUTROABS 10.4* 11.3*  HGB 9.3* 8.6*  HCT 27.7*  26.1*  MCV 86.6 87.3  PLT 291 737    Basic Metabolic  Panel: Recent Labs  Lab 05/20/21 1249 05/20/2021 1315  NA 121* 121*  K 2.9* 2.5*  CL 84* 84*  CO2 29 26  GLUCOSE 158* 112*  BUN 7 6  CREATININE 0.31* 0.42*  CALCIUM 8.3* 7.6*  MG 1.4*  --    GFR: Estimated Creatinine Clearance: 80.7 mL/min (A) (by C-G formula based on SCr of 0.42 mg/dL (L)). Recent Labs  Lab 05/20/21 1249 05/12/2021 1315 05/20/2021 1316  WBC 14.1* 12.3*  --   LATICACIDVEN  --   --  2.1*    Liver Function Tests: Recent Labs  Lab 05/20/21 1249 05/17/2021 1315  AST 424* 403*  ALT 243* 216*  ALKPHOS 483* 361*  BILITOT 5.5* 4.8*  PROT 6.7 6.1*  ALBUMIN 2.8* 1.9*   No results for input(s): LIPASE, AMYLASE in the last 168 hours. No results for input(s): AMMONIA in the last 168 hours.  ABG No results found for: PHART, PCO2ART, PO2ART, HCO3, TCO2, ACIDBASEDEF, O2SAT   Coagulation Profile: Recent Labs  Lab 05/19/2021 1315  INR 1.6*    Cardiac Enzymes: No results for input(s): CKTOTAL, CKMB, CKMBINDEX, TROPONINI in the last 168 hours.  HbA1C: No results found for: HGBA1C  CBG: No results for input(s): GLUCAP in the last 168 hours.  Review of Systems:   Unable to obtain in detail Skin rash since last admission 3 weeks ago. No nausea, vomiting, diarrhea, abdominal pain Fever for 1 day  Other detailed review of systems was negative  Past Medical History:  She,  has a past medical history of Asthma, Cancer ([place]), Family history of breast cancer, Family history of breast cancer, Family history of prostate cancer, Family history of throat cancer, Hypercholesteremia, Hypokalemia (04/21/2021), and PONV (postoperative nausea and vomiting).   Surgical History:   Past Surgical History:  Procedure Laterality Date   AXILLARY LYMPH NODE DISSECTION Left 07/11/2019   Procedure: LEFT AXILLARY LYMPH NODE DISSECTION;  Surgeon: Donnie Mesa, MD;  Location: [facility];  Service: General;  Laterality: Left;   BREAST LUMPECTOMY WITH RADIOACTIVE SEED  AND AXILLARY LYMPH NODE DISSECTION Left 06/13/2019   Procedure: LEFT BREAST LUMPECTOMY WITH RADIOACTIVE SEED AND AXILLARY TARGETED LYMPH NODE DISSECTION;  Surgeon: Donnie Mesa, MD;  Location: [facility];  Service: General;  Laterality: Left;   FINGER SURGERY     PORTACATH PLACEMENT Right 02/05/2019   Procedure: INSERTION PORT-A-CATH WITH ULTRASOUND;  Surgeon: Donnie Mesa, MD;  Location: [facility];  Service: General;  Laterality: Right;   PORTACATH PLACEMENT Right 04/14/2021   Procedure: INSERTION PORT-A-CATH;  Surgeon: Donnie Mesa, MD;  Location: [facility];  Service: General;  Laterality: Right;   WISDOM TOOTH EXTRACTION     WOUND EXPLORATION Right 06/13/2019   Procedure: RIGHT CHEST WOUND EXPLORATION;  Surgeon: Donnie Mesa, MD;  Location: [facility];  Service: General;  Laterality: Right;     Social History:   reports that she has never smoked. She has never used smokeless tobacco. She reports current alcohol use. She reports that she does not use drugs.   Family History:  Her family history includes Breast cancer in her maternal aunt and paternal aunt; Heart failure in her mother; Hypertension in her mother; Prostate cancer (age of onset: 60) in her father; Throat cancer in her cousin.   Allergies Allergies  Allergen Reactions   Other     Corn: Unknown   Adhesive [  Tape] Rash   Cefepime Rash   Latex Rash   Lovenox [Enoxaparin] Itching    Per patient when giving injection it causes her to itch   Tapentadol Rash     Home Medications  Prior to Admission medications   Medication Sig Start Date End Date Taking? Authorizing Provider  potassium chloride (MICRO-K) 10 MEQ CR capsule Take 2 capsules (20 mEq total) by mouth daily. 05/07/21   Nicholas Lose, MD  antiseptic oral rinse (BIOTENE) LIQD 15 mLs by Mouth Rinse route as needed for dry mouth. 04/30/21   Florencia Reasons, MD  feeding supplement (ENSURE ENLIVE / ENSURE PLUS)  LIQD Take 237 mLs by mouth 3 (three) times daily between meals. 04/30/21   Florencia Reasons, MD  fentaNYL (DURAGESIC) 25 MCG/HR Place 1 patch onto the skin every 3 (three) days. 05/03/21   Gardenia Phlegm, NP  lidocaine (XYLOCAINE) 2 % solution Use as directed 15 mLs in the mouth or throat every 3 (three) hours as needed for mouth pain. 04/30/21   Florencia Reasons, MD  lidocaine-prilocaine (EMLA) cream Apply to affected area once Patient taking differently: 1 application daily as needed. Apply to affected area once 04/07/21   Nicholas Lose, MD  magic mouthwash SOLN Take 5 mLs by mouth 3 (three) times daily as needed for mouth pain. 05/13/21   Nicholas Lose, MD  magnesium oxide (MAG-OX) 400 (240 Mg) MG tablet Take 1 tablet (400 mg total) by mouth daily. 04/30/21   Florencia Reasons, MD  Multiple Vitamin (MULTIVITAMIN WITH MINERALS) TABS tablet Take 1 tablet by mouth daily. 05/01/21   Florencia Reasons, MD  polyethylene glycol (MIRALAX / GLYCOLAX) 17 g packet Take 17 g by mouth daily. 04/30/21   Florencia Reasons, MD  sodium chloride (OCEAN) 0.65 % SOLN nasal spray Place 1 spray into both nostrils as needed for congestion. 04/30/21   Florencia Reasons, MD     Critical care time: [place] MD. [facility] [place] [facility] Pager : [phone]  If no response to pager , please call [phone] until 7 pm After 7:00 pm call Elink  [phone]   04/29/2021

## 2021-05-21 NOTE — Progress Notes (Signed)
Pharmacy Antibiotic Note  Paige Perry is a 46 y.o. female admitted on 05/08/2021 with  sepsis, unknown source .  Pharmacy has been consulted for vancomycin and aztreonam dosing. Pt had reaction to cefepime during recent hospitalization.   Today, 04/29/2021 WBC elevated SCr low Tmax 103.1 F  Plan: Aztreonam 2 g IV q8h Vancomycin 1250 mg LD given followed by maintenance dose of 1000 mg IV q12h for estimated AUC of 488 Monitor renal function, culture data. Check vancomycin levels at steady state as needed    Temp (24hrs), Avg:102.4 F (39.1 C), Min:102.1 F (38.9 C), Max:102.5 F (39.2 C)  Recent Labs  Lab 05/20/21 1249 05/22/2021 1315 05/04/2021 1316  WBC 14.1* 12.3*  --   CREATININE 0.31* 0.42*  --   LATICACIDVEN  --   --  2.1*    Estimated Creatinine Clearance: 80.7 mL/min (A) (by C-G formula based on SCr of 0.42 mg/dL (L)).    Allergies  Allergen Reactions   Other     Corn: Unknown   Adhesive [Tape] Rash   Cefepime Rash   Latex Rash   Lovenox [Enoxaparin] Itching    Per patient when giving injection it causes her to itch   Tapentadol Rash    Antimicrobials this admission: vancomycin 2/24 >>  metronidazole 2/24 x1 dose  Aztreonam 2/23 >>  Dose adjustments this admission:  Microbiology results: 2/24 BCx:  2/24 UCx:    Paige Perry, PharmD 05/12/2021 4:13 PM

## 2021-05-21 NOTE — Plan of Care (Signed)

## 2021-05-21 NOTE — Telephone Encounter (Signed)
Received call from pt sister Florentina Jenny stating that starting today pt is experiencing difficulty with her vision.  States pt is able to make out body figures but is unable to see a cup that is in her hand or her bedside furniture.  Also states pt is now experiencing severe pain radiating down her back and is unable to move.  Per MD pt sister needing to call [phone] ASAP and request EMS transport to the hospital for further evaluation and tx.  Pt sister verbalized understanding.

## 2021-05-21 NOTE — ED Provider Notes (Signed)
[facility] Provider Note   CSN: [id number] Arrival date & time: 05/20/2021  1221     History  Chief Complaint  Patient presents with   Code Sepsis    ALBANY WINSLOW is a 46 y.o. female.  HPI 46 yo female ho breast cancer, with known liver mets, on chemo, presents with complaints of fever and sepsis.  EMS reports that she had chemo yesterday.  They were called out today for possible weakness and infection.  They noted a temp of 101.5.  Patient is tachycardic to 150 and hypotensive with systolic blood pressure in the mid 80s she denies any specific complaints of pain.  She is reported to have some sacral wound breakdown.  Review of outpatient labs from yesterday reveal hypokalemia at 2.9, hyponatremia at 121 with previous being 125 on 216, elevated AST at 424 evaded total bilirubin at 5.5 she had a leukocytosis of 14,100 and anemia with hemoglobin 9.3     Home Medications Prior to Admission medications   Medication Sig Start Date End Date Taking? Authorizing Provider  potassium chloride (MICRO-K) 10 MEQ CR capsule Take 2 capsules (20 mEq total) by mouth daily. 05/07/21   Nicholas Lose, MD  antiseptic oral rinse (BIOTENE) LIQD 15 mLs by Mouth Rinse route as needed for dry mouth. 04/30/21   Florencia Reasons, MD  feeding supplement (ENSURE ENLIVE / ENSURE PLUS) LIQD Take 237 mLs by mouth 3 (three) times daily between meals. 04/30/21   Florencia Reasons, MD  fentaNYL (DURAGESIC) 25 MCG/HR Place 1 patch onto the skin every 3 (three) days. 05/03/21   Gardenia Phlegm, NP  lidocaine (XYLOCAINE) 2 % solution Use as directed 15 mLs in the mouth or throat every 3 (three) hours as needed for mouth pain. 04/30/21   Florencia Reasons, MD  lidocaine-prilocaine (EMLA) cream Apply to affected area once Patient taking differently: 1 application daily as needed. Apply to affected area once 04/07/21   Nicholas Lose, MD  magic mouthwash SOLN Take 5 mLs by mouth 3 (three) times daily as needed for  mouth pain. 05/13/21   Nicholas Lose, MD  magnesium oxide (MAG-OX) 400 (240 Mg) MG tablet Take 1 tablet (400 mg total) by mouth daily. 04/30/21   Florencia Reasons, MD  Multiple Vitamin (MULTIVITAMIN WITH MINERALS) TABS tablet Take 1 tablet by mouth daily. 05/01/21   Florencia Reasons, MD  polyethylene glycol (MIRALAX / GLYCOLAX) 17 g packet Take 17 g by mouth daily. 04/30/21   Florencia Reasons, MD  sodium chloride (OCEAN) 0.65 % SOLN nasal spray Place 1 spray into both nostrils as needed for congestion. 04/30/21   Florencia Reasons, MD      Allergies    Other, Adhesive [tape], Cefepime, Latex, Lovenox [enoxaparin], and Tapentadol    Review of Systems   Review of Systems  Unable to perform ROS: Acuity of condition   Physical Exam Updated Vital Signs BP (!) 89/81    Pulse (!) 161    Temp (!) 102.3 F (39.1 C)    Resp 16    SpO2 100%  Physical Exam Vitals and nursing note reviewed.  Constitutional:      General: She is not in acute distress.    Appearance: She is ill-appearing.  HENT:     Head: Normocephalic and atraumatic.     Right Ear: External ear normal.     Left Ear: External ear normal.     Nose: Nose normal.     Mouth/Throat:     Mouth:  Mucous membranes are dry.  Eyes:     Comments: Eyes are deviated to the right and patient can count fingers when placed to the right side of her She does not move eyes past midline and does not voluntarily seem to move them even to midline  Cardiovascular:     Rate and Rhythm: Regular rhythm. Tachycardia present.  Pulmonary:     Effort: Pulmonary effort is normal.     Breath sounds: Normal breath sounds.  Abdominal:     General: Bowel sounds are normal.     Palpations: Abdomen is soft.     Tenderness: There is no abdominal tenderness.  Musculoskeletal:        General: Normal range of motion.     Cervical back: Normal range of motion.  Skin:    Capillary Refill: Capillary refill takes less than 2 seconds.     Coloration: Skin is jaundiced.     Comments: Skin appears very  warm warm and dry with flaking diffusely There is breakdown of the sacrum consistent with sacral ulcer no significant surrounding erythema or fluctuance noted  Neurological:     Mental Status: She is alert.     Comments: Eyes are deviated to the right there is no facial droop noted Patient appears generally weak but is able to hold up both of her arms equally and bend her knees on the bed    ED Results / Procedures / Treatments   Labs (all labs ordered are listed, but only abnormal results are displayed) Labs Reviewed  LACTIC ACID, PLASMA - Abnormal; Notable for the following components:      Result Value   Lactic Acid, Venous 2.1 (*)    All other components within normal limits  COMPREHENSIVE METABOLIC PANEL - Abnormal; Notable for the following components:   Sodium 121 (*)    Potassium 2.5 (*)    Chloride 84 (*)    Glucose, Bld 112 (*)    Creatinine, Ser 0.42 (*)    Calcium 7.6 (*)    Total Protein 6.1 (*)    Albumin 1.9 (*)    AST 403 (*)    ALT 216 (*)    Alkaline Phosphatase 361 (*)    Total Bilirubin 4.8 (*)    All other components within normal limits  CBC WITH DIFFERENTIAL/PLATELET - Abnormal; Notable for the following components:   WBC 12.3 (*)    RBC 2.99 (*)    Hemoglobin 8.6 (*)    HCT 26.1 (*)    RDW 18.1 (*)    nRBC 0.4 (*)    Neutro Abs 11.3 (*)    Monocytes Absolute 0.0 (*)    All other components within normal limits  PROTIME-INR - Abnormal; Notable for the following components:   Prothrombin Time 18.6 (*)    INR 1.6 (*)    All other components within normal limits  APTT - Abnormal; Notable for the following components:   aPTT 46 (*)    All other components within normal limits  URINALYSIS, ROUTINE W REFLEX MICROSCOPIC - Abnormal; Notable for the following components:   Color, Urine AMBER (*)    Hgb urine dipstick SMALL (*)    Ketones, ur 20 (*)    Protein, ur 30 (*)    Bacteria, UA RARE (*)    All other components within normal limits  CBC -  Abnormal; Notable for the following components:   WBC 10.6 (*)    RBC 2.81 (*)    Hemoglobin 8.3 (*)  HCT 24.5 (*)    RDW 17.9 (*)    nRBC 0.6 (*)    All other components within normal limits  MAGNESIUM - Abnormal; Notable for the following components:   Magnesium 1.6 (*)    All other components within normal limits  RESP PANEL BY RT-PCR (FLU A&B, COVID) ARPGX2  CULTURE, BLOOD (ROUTINE X 2)  CULTURE, BLOOD (ROUTINE X 2)  URINE CULTURE  LACTIC ACID, PLASMA  CREATININE, SERUM  CBC  BASIC METABOLIC PANEL  MAGNESIUM  PHOSPHORUS  I-STAT BETA HCG BLOOD, ED ([facility], [facility], [facility] ONLY)    EKG EKG Interpretation  Date/Time:  Friday May 21 2021 12:58:26 EST Ventricular Rate:  152 PR Interval:  114 QRS Duration: 72 QT Interval:  263 QTC Calculation: 419 R Axis:   115 Text Interpretation: Sinus tachycardia Right axis deviation Low voltage, extremity and precordial leads Probable anteroseptal infarct, old Confirmed by Pattricia Boss [phone]) on 05/18/2021 2:30:35 PM  Radiology CT Head Wo Contrast  Result Date: 05/12/2021 CLINICAL DATA:  Brain metastases suspected EXAM: CT HEAD WITHOUT CONTRAST TECHNIQUE: Contiguous axial images were obtained from the base of the skull through the vertex without intravenous contrast. RADIATION DOSE REDUCTION: This exam was performed according to the departmental dose-optimization program which includes automated exposure control, adjustment of the mA and/or kV according to patient size and/or use of iterative reconstruction technique. COMPARISON:  None. FINDINGS: Brain: No evidence of acute intracranial hemorrhage or extra-axial collection.There is focal hypoattenuation and loss of gray-white matter differentiation within the right parieto-occipital region. No concerning mass effect. The ventricles are normal in size. Vascular: No hyperdense vessel or unexpected calcification. Skull: Negative for fracture. Sinuses/Orbits: Rightward gaze of the right eye. The lens  is not within the field of view of the left eye. The orbits are otherwise unremarkable. Paranasal sinuses are predominantly clear. Other: None. IMPRESSION: Focal hypoattenuation in the right parieto-occipital region favored to represent acute infarct. Given history of malignancy, an underlying metastasis is possible. Recommend MRI. Critical Value/emergent results were called by telephone at the time of interpretation on 05/10/2021 at 1:52 pm to provider [facility] Shawnae Leiva , who verbally acknowledged these results. Electronically Signed   By: Maurine Simmering M.D.   On: 05/07/2021 13:54   DG Chest Port 1 View  Result Date: 05/25/2021 CLINICAL DATA:  Questionable sepsis - evaluate for abnormality EXAM: PORTABLE CHEST 1 VIEW COMPARISON:  04/28/2021 FINDINGS: Right-sided chest port remains in place. Normal heart size. Moderate right-sided pleural effusion, increased from prior. Slightly prominent bilateral interstitial markings. No left-sided pleural effusion. No pneumothorax. Surgical clips in the left axilla. IMPRESSION: 1. Moderate right-sided pleural effusion, increased from prior. 2. Slightly prominent bilateral interstitial markings, possibly mild edema. Electronically Signed   By: Davina Poke D.O.   On: 05/12/2021 13:09   CT ANGIO HEAD NECK W WO CM W PERF  Result Date: 04/28/2021 CLINICAL DATA:  Neuro deficit, acute, stroke suspected EXAM: CT ANGIOGRAPHY HEAD AND NECK TECHNIQUE: Multidetector CT imaging of the head and neck was performed using the standard protocol during bolus administration of intravenous contrast. Multiplanar CT image reconstructions and MIPs were obtained to evaluate the vascular anatomy. Carotid stenosis measurements (when applicable) are obtained utilizing NASCET criteria, using the distal internal carotid diameter as the denominator. RADIATION DOSE REDUCTION: This exam was performed according to the departmental dose-optimization program which includes automated exposure control,  adjustment of the mA and/or kV according to patient size and/or use of iterative reconstruction technique. CONTRAST:  161m OMNIPAQUE IOHEXOL 350 MG/ML  SOLN COMPARISON:  Same day CT head. FINDINGS: CTA NECK FINDINGS Aortic arch: Great vessel origins are patent without significant stenosis. Right carotid system: No evidence of dissection, stenosis (50% or greater) or occlusion. Left carotid system: No evidence of dissection, stenosis (50% or greater) or occlusion. Mild narrowing of the ICA origin without greater than 50% stenosis. Vertebral arteries: Mildly right dominant. No evidence of dissection, stenosis (50% or greater) or occlusion. Skeleton: Moderate multilevel degenerative change in the cervical spine. Other neck: No evidence of acute abnormality on limited assessment. Upper chest: Large right pleural effusion with overlying atelectasis or consolidation, partially imaged. Review of the MIP images confirms the above findings CTA HEAD FINDINGS Limited by venous contrast timing and mild motion. Within this limitation: Anterior circulation: Bilateral intracranial ICAs, MCAs, and ACAs are patent without proximal hemodynamically significant stenosis. No aneurysm identified. Posterior circulation: Bilateral intradural vertebral arteries, basilar artery, and posterior cerebral arteries are patent without proximal hemodynamically significant stenosis. No aneurysm identified. Venous sinuses: As permitted by contrast timing, patent. Review of the MIP images confirms the above findings IMPRESSION: 1. No large vessel occlusion or proximal hemodynamically significant stenosis in the head or neck. 2. Partially imaged large right pleural effusion with overlying atelectasis or consolidation. Electronically Signed   By: Margaretha Sheffield M.D.   On: 05/19/2021 16:35    Procedures Procedures    Medications Ordered in ED Medications  lactated ringers infusion ( Intravenous New Bag/Given 05/07/2021 1356)  metroNIDAZOLE  (FLAGYL) IVPB 500 mg (500 mg Intravenous New Bag/Given 05/15/2021 1635)  sodium chloride (PF) 0.9 % injection (has no administration in time range)  sodium chloride (PF) 0.9 % injection (has no administration in time range)  docusate sodium (COLACE) capsule 100 mg (has no administration in time range)  polyethylene glycol (MIRALAX / GLYCOLAX) packet 17 g (has no administration in time range)  heparin injection 5,000 Units (has no administration in time range)  potassium chloride 10 mEq in 100 mL IVPB (has no administration in time range)  magnesium sulfate IVPB 4 g 100 mL (has no administration in time range)  aztreonam (AZACTAM) 2 g in sodium chloride 0.9 % 100 mL IVPB (0 g Intravenous Stopped 05/04/2021 1408)  lactated ringers bolus 1,000 mL (0 mLs Intravenous Stopped 05/16/2021 1639)    And  lactated ringers bolus 1,000 mL (1,000 mLs Intravenous New Bag/Given 04/28/2021 1335)    And  lactated ringers bolus 250 mL (250 mLs Intravenous New Bag/Given 05/18/2021 1637)  vancomycin (VANCOREADY) IVPB 1250 mg/250 mL (0 mg Intravenous Stopped 05/08/2021 1512)  potassium chloride 10 mEq in 100 mL IVPB (10 mEq Intravenous New Bag/Given 05/01/2021 1541)  iohexol (OMNIPAQUE) 350 MG/ML injection 80 mL (100 mLs Intravenous Contrast Given 05/08/2021 1556)  adenosine (ADENOCARD) 6 MG/2ML injection 6 mg (6 mg Intravenous Given 05/15/2021 1520)    ED Course/ Medical Decision Making/ A&P                           Medical Decision Making 46 year old female history of metastatic breast cancer presents today with fever hypotension as code sepsis.  She is treated here with IV fluids and broad-spectrum antibiotics.  Patient had some improvement in her blood pressure.  She continued significantly tachycardic.  EKG appears to be sinus tachycardia but unclear if there could be an underlying SVT or atrial flutter. Additionally, patient has rightward gaze.  Last known normal was last night at 830. Head CT concerning for stroke versus  met  Discussed with Dr. Leonel Ramsay.  He requests CT angiogram with perfusion study Dr. Elsworth Soho present in ED-discussed possibility of underlying arrhythmia.  He plans to give dose of adenosine Patient with multiple electrolyte abnormalities and receiving fluids and potassium here in ED  Amount and/or Complexity of Data Reviewed Independent Historian:     Details: Patient's sister was called She is now at the bedside External Data Reviewed:     Details: Reviewed last hospital admission when patient had admission for neutropenic fever.  At that time she was treated with antibiotics and was noted to be hyponatremic and required midodrine.  Palliative care consult was obtained. Labs: ordered. Decision-making details documented in ED Course. Radiology: ordered and independent interpretation performed. Decision-making details documented in ED Course. ECG/medicine tests: ordered and independent interpretation performed. Decision-making details documented in ED Course. Discussion of management or test interpretation with external provider(s): Discussed with radiologist who reviewed head CT.  New lesion in cerebellum consistent with cerebellar stroke versus not and recommends MRI Discussed care with Dr. Elsworth Soho on-call for critical care Discussed with sister, Devone Bonilla- she states tired after infusion last night.  At 11, she went to check on her and noted rightward gaze- lkn yesterday at 830-900 pm.   Today she called Dr. Geralyn Flash office and told them about pain in back whenever she tried to move Discussed code status with Vickie- she states that she does not think patient wishes invasive interventions. Palliative care consult pending  Discussed with palliative care and they will see in consult  Risk Prescription drug management. Decision regarding hospitalization.  Critical Care Total time providing critical care: 30-74 minutes          Final Clinical Impression(s) / ED Diagnoses Final  diagnoses:  Sepsis with acute organ dysfunction, due to unspecified organism, unspecified type, unspecified whether septic shock present ([place])  Tachycardia  Hypotension, unspecified hypotension type  Cerebrovascular accident (CVA), unspecified mechanism ([place])    Rx / DC Orders ED Discharge Orders     None         Pattricia Boss, MD 05/07/2021 1712

## 2021-05-21 NOTE — Progress Notes (Signed)
Pharmacy Note   A consult was received from an ED physician for vancomycin and aztreonam per pharmacy dosing.    The patient's profile has been reviewed for ht/wt/allergies/indication/available labs.    A one time order has been placed for vancomycin 1250 mg IV x1 and aztreonam 2 gr IV x1 .  Consult for aztreonam requests to evaluate if possible to change to cefepime. Pt noted to develop rash to cefepime while on med on last admission so will continue with aztreonam     Further antibiotics/pharmacy consults should be ordered by admitting physician if indicated.                       Thank you,  Royetta Asal, PharmD, BCPS 05/02/2021 12:59 PM

## 2021-05-21 NOTE — Sepsis Progress Note (Signed)
eLink is following this Code Sepsis. °

## 2021-05-21 NOTE — Progress Notes (Signed)
Contacted [facility] in regards to patients neuro status. Patient did receive ativan and Is less responsive. Still responds to pain but does not open eyes or follow commands.   Patient now started on 3rd pressor. Requested order for Art line as well as ABG to make sure patient is maintaining airway. Current O2 saturation remains 100%. See flow sheet for vitals.   Attempted to contact patients sister whom is HCPOA. No one answered. Patient did state on arrival that family is difficult to get a hold of.

## 2021-05-21 NOTE — Progress Notes (Signed)
Patient noted to have fentanyl patch on left arm.

## 2021-05-21 NOTE — Progress Notes (Signed)
Patient recently arrived to 2M08. Patient is alert and oriented x4. F/C. Patients eyes do deviate to the right. Patient arrived with half strength neo infusing into right PCA with a BP per flow sheet. Skin assessment performed with Probation officer and Archivist. Patient noted to have flakey skin all over body neck to ankles. Patient also noted to have stage 2 sacral wound. Measuring 4 CM in length and 2 cm wide. Sacral dressing applied. Patient denies any pain at this time. Patient oriented to unit. See flow sheet for assessment. Safety measures implemented. Patient due for MRI will contact at this time.

## 2021-05-21 NOTE — Consult Note (Addendum)
Neurology Consultation  Reason for Consult: Subacute stroke versus mass on CT head Referring Physician: Dr. Pattricia Boss  CC: Visual difficulties, generalized weakness, difficulty with ambulation  History is obtained from: Chart review, patient  HPI: Paige Perry is a 46 y.o. female past history of breast cancer metastatic to the liver, recent hospitalization 04/22/2021 to 04/30/2021 with neutropenic fever and failure to thrive, on chemotherapy with Keytruda and Abraxane, brought in by EMS for evaluation of weakness, hypertension, visual changes, fever.  In the emergency room, evaluated as code sepsis and admitted to the ICU service.  Noted to have rightward gaze preference for which a CT head was done which was suggestive of a right parietal occipital hypodensity-stroke versus mets.  Admitted to [facility] for further work-up. Neurology consulted for the abnormal clinical and imaging findings. Patient is not a very good historian.  Cannot tell me if the onset of symptoms was sudden/abrupt or gradual. Reports she is being trouble with her vision-reports that she was not able to see anything with generalized blurred vision when she was attempting to see her phone-could not tell me the timeline. According to chart review, patient has been having trouble with walking.  She reports pain radiating to both her legs.  Was not able to move her hips antigravity bilaterally.  In the ER, was able to bend her knees somewhat but do not get a sense that she ever was able to walk in the ER.   LKW: Unclear tpa given?: no, definitely outside the window by the time seen by neurology Premorbid modified Rankin scale (mRS): 3-4   ROS: Attempted a full review of systems but limited due to patient's mental status  Past Medical History:  Diagnosis Date   Asthma    Cancer ([facility])    breast   Family history of breast cancer    Family history of breast cancer    Family history of prostate cancer    Family  history of throat cancer    Hypercholesteremia    Hypokalemia 04/21/2021   PONV (postoperative nausea and vomiting)     Family History  Problem Relation Age of Onset   Hypertension Mother    Heart failure Mother    Breast cancer Maternal Aunt        dx 45s   Breast cancer Paternal Aunt    Prostate cancer Father 61   Throat cancer Cousin      Social History:   reports that she has never smoked. She has never used smokeless tobacco. She reports current alcohol use. She reports that she does not use drugs.  Medications  Current Facility-Administered Medications:    acetaminophen (TYLENOL) tablet 650 mg, 650 mg, Oral, Q6H PRN, Rigoberto Noel, MD   aztreonam (AZACTAM) 2 g in sodium chloride 0.9 % 100 mL IVPB, 2 g, Intravenous, Q8H, [name], RPH   [START ON 05/22/2021] Chlorhexidine Gluconate Cloth 2 % PADS 6 each, 6 each, Topical, Daily, Kara Mead V, MD, 6 each at 05/20/2021 2128   docusate sodium (COLACE) capsule 100 mg, 100 mg, Oral, BID PRN, Gerald Leitz D, NP   heparin injection 5,000 Units, 5,000 Units, Subcutaneous, Q8H, Harris, Whitney D, NP, 5,000 Units at 05/22/2021 2134   lactated ringers infusion, , Intravenous, Continuous, Pattricia Boss, MD, Last Rate: 150 mL/hr at 05/22/2021 1356, New Bag at 05/03/2021 1356   phenylephrine (NEO-SYNEPHRINE) 20mg /NS 270mL premix infusion, 0-400 mcg/min, Intravenous, Titrated, Elsworth Soho, Leanna Sato, MD   polyethylene glycol (MIRALAX /  GLYCOLAX) packet 17 g, 17 g, Oral, Daily PRN, Kenton Kingfisher, Whitney D, NP   potassium chloride 10 mEq in 100 mL IVPB, 10 mEq, Intravenous, Q1 Hr x 6, Harris, Whitney D, NP, Last Rate: 100 mL/hr at 04/28/2021 2132, 10 mEq at 05/16/2021 2132   sodium chloride (PF) 0.9 % injection, , , ,    sodium chloride (PF) 0.9 % injection, , , ,    [START ON 05/22/2021] vancomycin (VANCOCIN) IVPB 1000 mg/200 mL premix, 1,000 mg, Intravenous, Q12H, Rigoberto Noel, MD   Exam: Current vital signs: BP (!) 82/59 (BP Location: Right Arm)     Pulse (!) 136    Temp 98.6 F (37 C) (Oral)    Resp (!) 35    SpO2 96%  Vital signs in last 24 hours: Temp:  [98.6 F (37 C)-103.3 F (39.6 C)] 98.6 F (37 C) (02/24 2138) Pulse Rate:  [136-167] 136 (02/24 2138) Resp:  [16-45] 35 (02/24 2138) BP: (75-139)/(54-119) 82/59 (02/24 2138) SpO2:  [95 %-100 %] 96 % (02/24 2138) General: Awake alert in no distress HEENT: Normocephalic atraumatic CVS: Hypotensive tachycardic Respiratory: Breathing well saturating normally on room air Abdomen nondistended nontender Neurological exam Awake alert oriented to self. When asked where she is, she said she is being transferred to multiple places and does not know the exact location.  On telling her that she is at [facility] in [place], she said that she was told at one point that she would be transferred. Upon asking her the month-she said March.  When I told her that that started right, she said are we still in February?  I told her yes we are in February.  She was able to tell me the correct year. Her speech is mildly dysarthric Repetition is intact Naming is intact but she has a hard time focusing on objects. Cranial nerves: Pupils are equal round reactive to light, she has a rightward gaze preference but I was able to overcome that by having her follow my face all the way from the right side by moving to her left.  She has a hard time crossing the midline and maintaining gaze to the left.  She does not blink to threat from the left side.  She blinks to threat and count fingers on the right.  Face appears symmetric.  Tongue and palate midline. Motor examination with no drift in bilateral upper extremities although the left appears somewhat weaker than the right.  Both lower extremities are barely 2/5. Sensory exam: Intact to touch, neglect on the left Coordination: No dysmetria in the upper extremities NIH stroke scale 1a Level of Conscious.: 0 1b LOC Questions: 1 1c LOC Commands: 0 2  Best Gaze: 1 3 Visual: 2 4 Facial Palsy: 0 5a Motor Arm - left: 0 5b Motor Arm - Right: 0 6a Motor Leg - Left: 3 6b Motor Leg - Right: 3 7 Limb Ataxia: 0 8 Sensory: 0 9 Best Language: 0 10 Dysarthria: 1 11 Extinct. and Inatten.: 1 TOTAL: 12  Labs I have reviewed labs in epic and the results pertinent to this consultation are:   CBC    Component Value Date/Time   WBC 10.6 (H) 05/02/2021 1648   RBC 2.81 (L) 05/04/2021 1648   HGB 8.3 (L) 05/03/2021 1648   HGB 9.3 (L) 05/20/2021 1249   HCT 24.5 (L) 05/11/2021 1648   PLT 158 05/06/2021 1648   PLT 291 05/20/2021 1249   MCV 87.2 05/06/2021 1648   MCH  29.5 05/05/2021 1648   MCHC 33.9 05/04/2021 1648   RDW 17.9 (H) 05/10/2021 1648   LYMPHSABS 1.0 05/14/2021 1315   MONOABS 0.0 (L) 05/01/2021 1315   EOSABS 0.0 05/07/2021 1315   BASOSABS 0.0 05/18/2021 1315    CMP     Component Value Date/Time   NA 121 (L) 05/25/2021 1315   K 2.5 (LL) 05/25/2021 1315   CL 84 (L) 05/25/2021 1315   CO2 26 05/20/2021 1315   GLUCOSE 112 (H) 05/01/2021 1315   GLUCOSE 87 03/31/2006 1359   BUN 6 05/11/2021 1315   CREATININE <0.30 (L) 05/13/2021 1648   CREATININE 0.31 (L) 05/20/2021 1249   CALCIUM 7.6 (L) 05/24/2021 1315   PROT 6.1 (L) 05/16/2021 1315   ALBUMIN 1.9 (L) 05/02/2021 1315   AST 403 (H) 04/29/2021 1315   AST 424 (HH) 05/20/2021 1249   ALT 216 (H) 05/03/2021 1315   ALT 243 (H) 05/20/2021 1249   ALKPHOS 361 (H) 05/07/2021 1315   BILITOT 4.8 (H) 05/18/2021 1315   BILITOT 5.5 (HH) 05/20/2021 1249   GFRNONAA NOT CALCULATED 05/06/2021 1648   GFRNONAA >60 05/20/2021 1249   GFRAA >60 05/28/2019 0845     Imaging I have reviewed the images obtained:  CT-head-concerning for hypodensity in the right parieto-occipital area-stroke versus metastasis. CTA H+N - no ELVO  Assessment:  46 year old with history of metastatic breast cancer to the liver, recent neutropenic fever, presenting to the emergency room for evaluation of rightward  gaze preference, weakness, noted to be hypotensive, with leukocytosis concerning for sepsis and admitted to the ICU. Patient has been having difficulty with ambulation-unclear what her baseline ambulation is but unable to move both legs antigravity.  Also has a gaze preference to the right and neglect on the left.  CT head with concern for a right parietal occipital area stroke versus metastasis. No clear timeline of last known well or the sudden onset versus gradual onset of symptoms precludes tPA. Differentials from a neurological standpoint include right right occipital stroke versus seizure emanating from the metastasis. Question lower spine involvement given the lower extremity weakness but timeline is unclear.  Would like more history.  Recommendations: MRI brain with and without contrast I would give her a dose of Ativan IV 2 mg x 1 to see if that helps with the gaze which might indicate that this probably is a seizure.   I would also recommend loading her with Keppra 1500 mg IV x1. Routine EEG in the morning-unfortunately there is no MRI technologist available at night and the rapid EEG machine is already in use. Management of sepsis per primary team She also might need MRI of lumbar spine given lower extremity weakness but before that I would like to get some more history once family becomes available. Full stroke work-up only if the MRI is consistent with stroke. Also given sepsis and concern for stroke-endocarditis should also be explored as a possibility.  We will hold off on antiplatelets or anticoagulation for now given the concern for mycotic aneurysms. 2D echocardiogram Neurology will continue to follow with you. Plan discussed with patient's RN at bedside Plan relayed via secure chat to Dr. James Ivanoff, PCCM  -- Amie Portland, MD Neurologist [facility] Pager: [phone]

## 2021-05-21 NOTE — Progress Notes (Addendum)
eLink Physician-Brief Progress Note Patient Name: Paige Perry DOB: 02-Dec-1975 MRN: [id number]   Date of Service  05/18/2021  HPI/Events of Note  45/F with metastatic breast cancer, brought in due to fever, tachycardia, hypotension.  Pt with sepsis with no clear focus of infection. Pt was being transferred to 35M but patient is hypotensive.  eICU Interventions  Start on neosynephrine gtt through her port.      Intervention Category Intermediate Interventions: Hypotension - evaluation and management  Elsie Lincoln 05/05/2021, 8:38 PM  9:28 PM Pt also with right parieto-occipital CVA.  Neurology consulted.   Plan> MRI will be done tonight.  EEG ordered.  Thoracentesis for effusion. Continue empiric antibiotics. Heparin for DVT prophylaxis.  10:26 PM Pt on max dose of neosynephrine with SBP 80s-90s, HR 120s.   Plan> Add vasopressin.   11:31 PM She remains hypotensive with BP 77/61 on neo and vasopressin.   Plan> Add levophed.  Insert arterial line.  Appreciate neurology recommendations.  11:39 PM Notified that patient is difficult to arouse.  She responds to sternal rubbing.  Plan> Get ABG to ensure she is protecting her airway.  12:47 AM ABG 7.447/26.9/101.   RN reporting loose stools.   Plan> Insert flexiseal.  Continue to monitor O2 support.   2:46 AM Pt is more awake and alert and complaining of pain. She uses fentanyl patch at home.   Plan> Fentanyl IV PRN ordered.  4:02 AM K 2.8, calcium 6.9, phos 1.7, crea 0.45.  Plan> Kphos 65mmol ordered.  KCL 15meq x 4.  Calcium gluconate 1g IV ordered.  6:20 AM Pt in SVT with rate in the 150s.   Plan> Pt given adenosine 6mg  IV with improvement in the rate into the 90s and sinus rhythm but only transient. Titrate levophed as much as possible.  Increase neosynephrine to keep MAP >65.

## 2021-05-21 NOTE — Progress Notes (Signed)
PCCM progress note  Given concern for underlying sepsis due to unknown source and the presence of right pleural effusion decision was made to perform thoracentesis to collect fluid to ensure pleural space was not infected.  Indications and risks discussed with patient and patient's sister at bedside.  They verbalized understanding but patient wanted to wait until "I feel better".  Procedure was not performed and will likely need to be reconsidered in the future once transferred.   Paige Perry D. Kenton Kingfisher, NP-C [facility] Personal contact information can be found on Amion  05/20/2021, 5:46 PM

## 2021-05-22 ENCOUNTER — Other Ambulatory Visit: Payer: Self-pay

## 2021-05-22 ENCOUNTER — Inpatient Hospital Stay (HOSPITAL_COMMUNITY): Payer: BC Managed Care – PPO

## 2021-05-22 DIAGNOSIS — I639 Cerebral infarction, unspecified: Secondary | ICD-10-CM | POA: Diagnosis not present

## 2021-05-22 DIAGNOSIS — Z7189 Other specified counseling: Secondary | ICD-10-CM

## 2021-05-22 DIAGNOSIS — Z515 Encounter for palliative care: Secondary | ICD-10-CM | POA: Diagnosis not present

## 2021-05-22 DIAGNOSIS — R6520 Severe sepsis without septic shock: Secondary | ICD-10-CM | POA: Diagnosis not present

## 2021-05-22 DIAGNOSIS — Z66 Do not resuscitate: Secondary | ICD-10-CM

## 2021-05-22 DIAGNOSIS — R7881 Bacteremia: Secondary | ICD-10-CM

## 2021-05-22 DIAGNOSIS — I6389 Other cerebral infarction: Secondary | ICD-10-CM

## 2021-05-22 DIAGNOSIS — C50919 Malignant neoplasm of unspecified site of unspecified female breast: Secondary | ICD-10-CM | POA: Diagnosis not present

## 2021-05-22 DIAGNOSIS — I76 Septic arterial embolism: Secondary | ICD-10-CM | POA: Diagnosis not present

## 2021-05-22 DIAGNOSIS — J90 Pleural effusion, not elsewhere classified: Secondary | ICD-10-CM | POA: Diagnosis not present

## 2021-05-22 DIAGNOSIS — B9562 Methicillin resistant Staphylococcus aureus infection as the cause of diseases classified elsewhere: Secondary | ICD-10-CM

## 2021-05-22 DIAGNOSIS — A419 Sepsis, unspecified organism: Secondary | ICD-10-CM | POA: Diagnosis not present

## 2021-05-22 LAB — BLOOD CULTURE ID PANEL (REFLEXED) - BCID2

## 2021-05-22 LAB — POCT I-STAT 7, (LYTES, BLD GAS, ICA,H+H)
Acid-base deficit: 5 mmol/L — ABNORMAL HIGH (ref 0.0–2.0)
Bicarbonate: 18.6 mmol/L — ABNORMAL LOW (ref 20.0–28.0)
Calcium, Ion: 1.02 mmol/L — ABNORMAL LOW (ref 1.15–1.40)
HCT: 24 % — ABNORMAL LOW (ref 36.0–46.0)
Hemoglobin: 8.2 g/dL — ABNORMAL LOW (ref 12.0–15.0)
O2 Saturation: 98 %
Patient temperature: 37
Potassium: 3.1 mmol/L — ABNORMAL LOW (ref 3.5–5.1)
Sodium: 128 mmol/L — ABNORMAL LOW (ref 135–145)
TCO2: 19 mmol/L — ABNORMAL LOW (ref 22–32)
pCO2 arterial: 26.9 mmHg — ABNORMAL LOW (ref 32–48)
pH, Arterial: 7.447 (ref 7.35–7.45)
pO2, Arterial: 101 mmHg (ref 83–108)

## 2021-05-22 LAB — BASIC METABOLIC PANEL
Anion gap: 14 (ref 5–15)
BUN: <5 mg/dL — ABNORMAL LOW (ref 6–20)
CO2: 15 mmol/L — ABNORMAL LOW (ref 22–32)
Calcium: 6.9 mg/dL — ABNORMAL LOW (ref 8.9–10.3)
Chloride: 95 mmol/L — ABNORMAL LOW (ref 98–111)
Creatinine, Ser: 0.46 mg/dL (ref 0.44–1.00)
GFR, Estimated: >60 mL/min (ref >60)
Glucose, Bld: 201 mg/dL — ABNORMAL HIGH (ref 70–99)
Potassium: 2.8 mmol/L — ABNORMAL LOW (ref 3.5–5.1)
Sodium: 124 mmol/L — ABNORMAL LOW (ref 135–145)

## 2021-05-22 LAB — CBC
HCT: 24.9 % — ABNORMAL LOW (ref 36.0–46.0)
Hemoglobin: 8.4 g/dL — ABNORMAL LOW (ref 12.0–15.0)
MCH: 29.8 pg (ref 26.0–34.0)
MCHC: 33.7 g/dL (ref 30.0–36.0)
MCV: 88.3 fL (ref 80.0–100.0)
Platelets: 135 10*3/uL — ABNORMAL LOW (ref 150–400)
RBC: 2.82 MIL/uL — ABNORMAL LOW (ref 3.87–5.11)
RDW: 18.4 % — ABNORMAL HIGH (ref 11.5–15.5)
WBC: 7.7 10*3/uL (ref 4.0–10.5)
nRBC: 0.5 % — ABNORMAL HIGH (ref 0.0–0.2)

## 2021-05-22 LAB — ECHOCARDIOGRAM COMPLETE
AV Mean grad: 2 mmHg
AV Peak grad: 4 mmHg
Ao pk vel: 1 m/s
S' Lateral: 3.2 cm
Weight: 2522.06 oz

## 2021-05-22 LAB — GLUCOSE, CAPILLARY
Glucose-Capillary: 168 mg/dL — ABNORMAL HIGH (ref 70–99)
Glucose-Capillary: 132 mg/dL — ABNORMAL HIGH (ref 70–99)
Glucose-Capillary: 120 mg/dL — ABNORMAL HIGH (ref 70–99)

## 2021-05-22 LAB — MAGNESIUM: Magnesium: 2.2 mg/dL (ref 1.7–2.4)

## 2021-05-22 LAB — PHOSPHORUS: Phosphorus: 1.7 mg/dL — ABNORMAL LOW (ref 2.5–4.6)

## 2021-05-22 LAB — CALCIUM, IONIZED: Calcium, Ionized, Serum: 4.8 mg/dL (ref 4.5–5.6)

## 2021-05-22 IMAGING — MR MR HEAD WO/W CM
13 of 16 series · 36 of 48 positions shown · IV contrast (gadavist)
Comparison: Head CT from yesterday.

CLINICAL DATA: Neuro deficit with acute stroke suspected.
Metastatic breast cancer.

EXAM:
MRI HEAD WITHOUT AND WITH CONTRAST
TECHNIQUE: Multiplanar, multiecho pulse sequences of the brain and surrounding
structures were obtained without and with intravenous contrast.
CONTRAST:  7mL GADAVIST GADOBUTROL 1 MMOL/ML IV SOLN

[Series 5: DWI · axial · 3.0mm · 0.88mm/px · z∈[-97,+39]mm · 5 of 96 slices shown (1 of 4)]
[im 1/96]
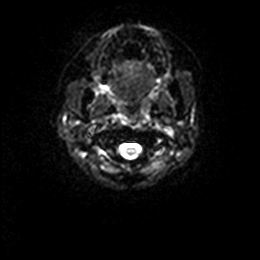
[im 24/96]
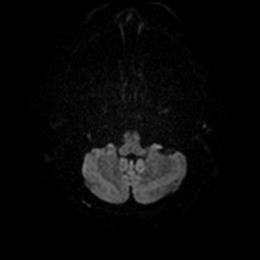
[im 48/96]
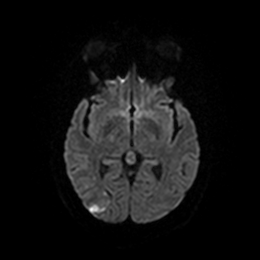
[im 72/96]
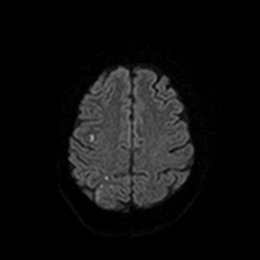
[im 96/96]
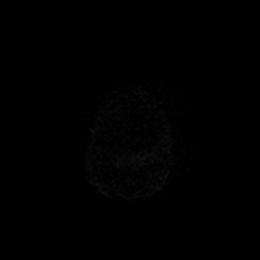

[Series 6: DWI · axial · 3.0mm · 0.88mm/px · z∈[-97,+39]mm · 3 of 48 slices shown (2 of 4)]
[im 1/48]
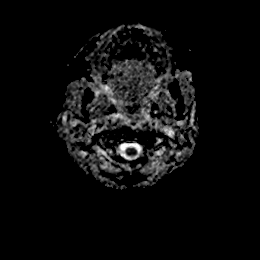
[im 24/48]
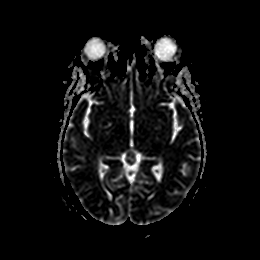
[im 48/48]
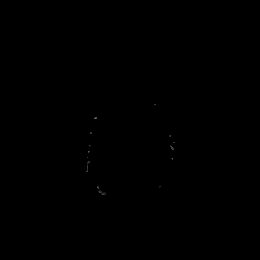

[Series 7: DWI · coronal · 4.0mm · 0.88mm/px · 4 of 64 slices shown (3 of 4)]
[im 1/64]
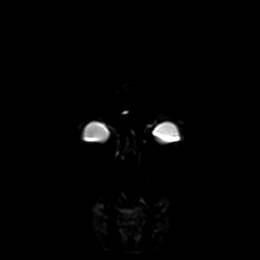
[im 22/64]
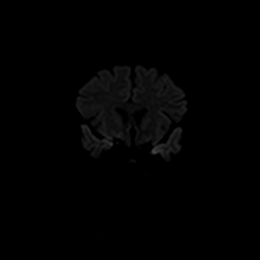
[im 43/64]
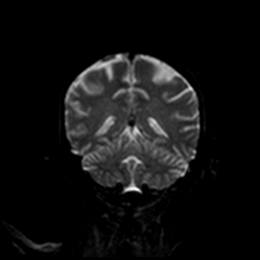
[im 64/64]
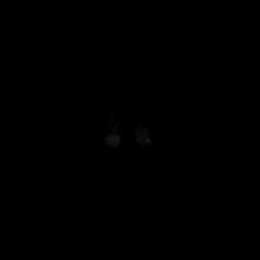

[Series 8: DWI · coronal · 4.0mm · 0.88mm/px · 2 of 32 slices shown (4 of 4)]
[im 1/32]
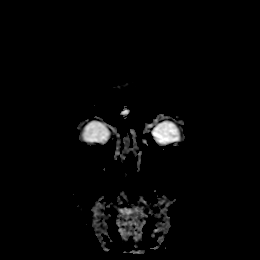
[im 32/32]
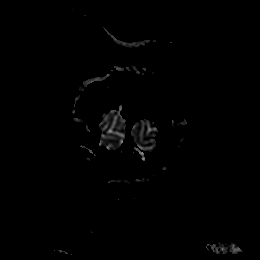

[Series 9: T1 · sagittal · 5.0mm · 0.75mm/px · 2 of 23 slices shown]
[im 1/23]
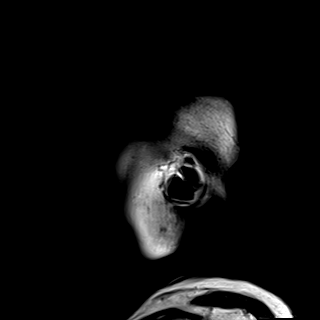
[im 23/23]
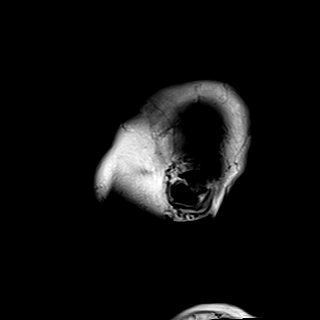

[Series 10: T2 · axial · 5.0mm · 0.72mm/px · z∈[-98,+41]mm · 2 of 25 slices shown]
[im 1/25]
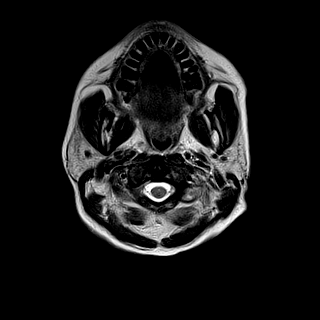
[im 25/25]
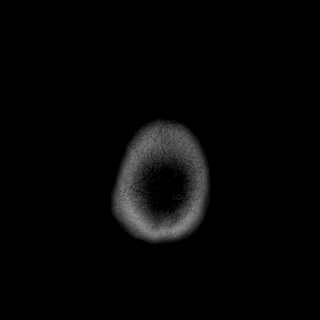

[Series 11: FLAIR · axial · 5.0mm · 0.45mm/px · z∈[-94,+44]mm · 2 of 25 slices shown (1 of 2)]
[im 1/25]
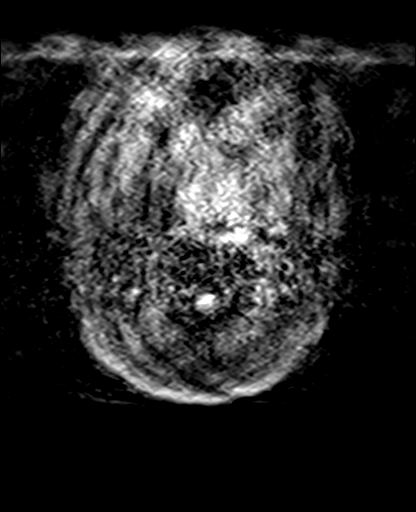
[im 25/25]
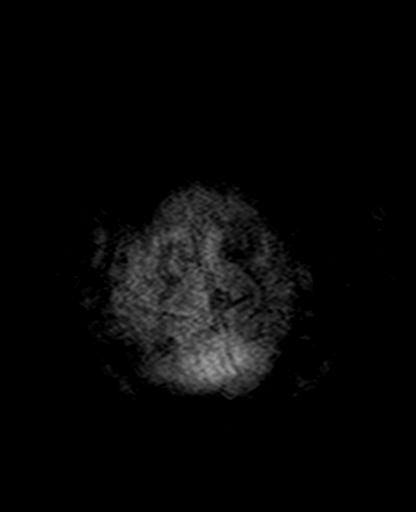

[Series 13: pha_images · axial · 3.0mm · 0.90mm/px · z∈[-107,+60]mm · 4 of 59 slices shown]
[im 1/59]
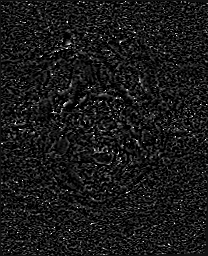
[im 20/59]
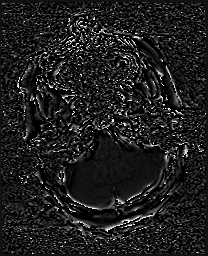
[im 39/59]
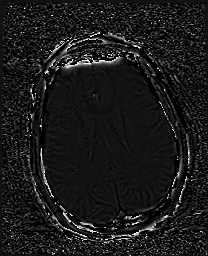
[im 59/59]
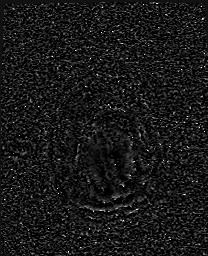

[Series 14: swi_images · axial · 3.0mm · 0.90mm/px · z∈[-110,+60]mm · 4 of 60 slices shown]
[im 1/60]
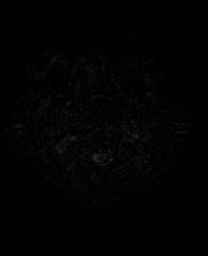
[im 20/60]
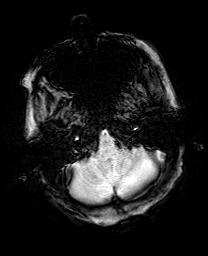
[im 40/60]
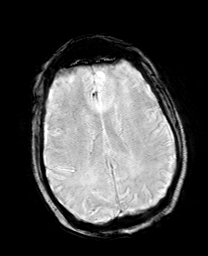
[im 60/60]
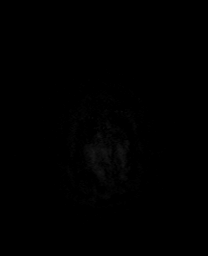

[Series 16: FLAIR · axial · 5.0mm · 0.45mm/px · z∈[-94,+44]mm · 2 of 25 slices shown (2 of 2)]
[im 1/25]
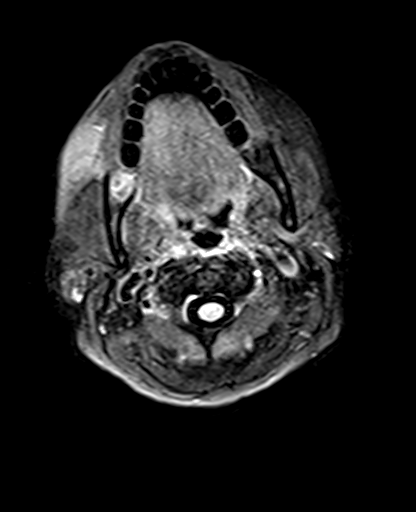
[im 25/25]
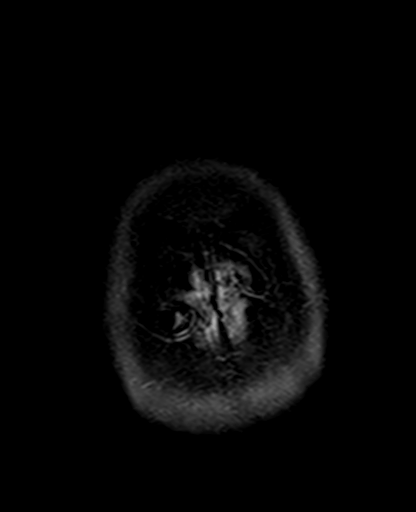

[Series 18: T2 post-contrast · coronal · 5.0mm · 0.72mm/px · 2 of 28 slices shown]
[im 1/28]
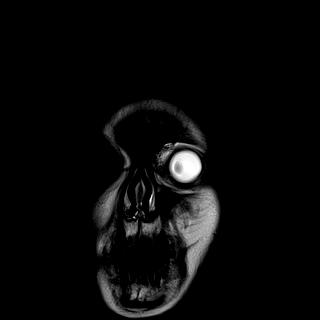
[im 28/28]
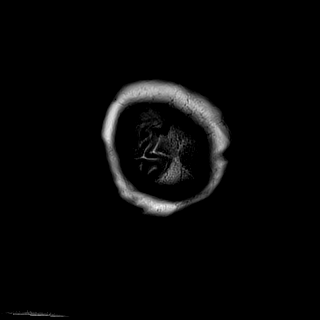

[Series 20: T1 post-contrast · coronal · 5.0mm · 0.34mm/px · 2 of 28 slices shown (1 of 2)]
[im 1/28]
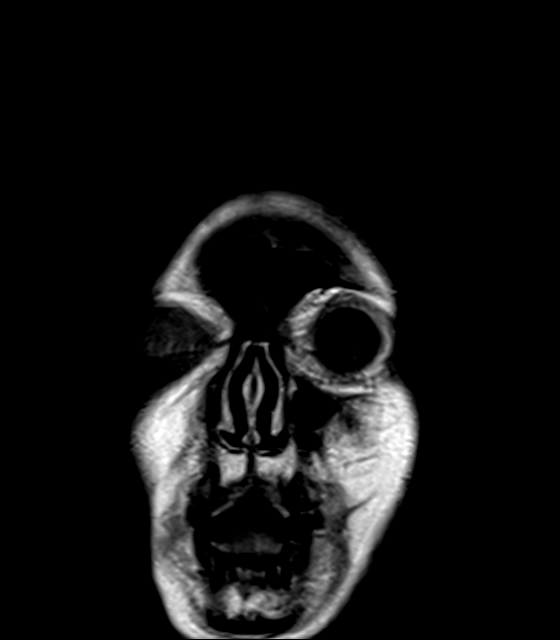
[im 28/28]
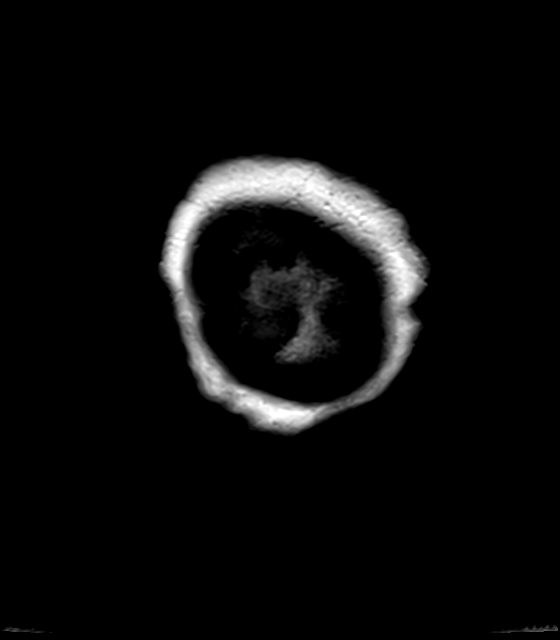

[Series 21: T1 post-contrast · sagittal · 5.0mm · 0.72mm/px · 2 of 23 slices shown (2 of 2)]
[im 1/23]
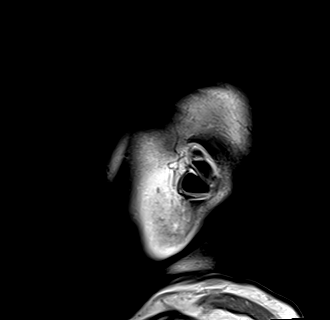
[im 23/23]
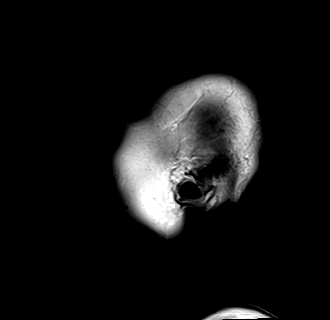

[36 of 48 positions shown; findings below may reference images not displayed]

FINDINGS: Brain: Patchy foci of restricted diffusion in the left cerebellum,
high right frontal, bilateral parietal, and right more than left
occipital cortex. Mild involvement in the deep right cerebral white
matter and caudate body. Mild petechial hemorrhage associated with
right convexity infarcts. A few areas of faint cortically based
enhancement are suggested in the occipital and parietal cortex on
coronal and sagittal postcontrast imaging, but limited by motion.
More linear indefinite enhancement along the surface of the right
cerebellum where there is no restricted diffusion.

No hydrocephalus, collection, or brain atrophy

Vascular: Normal flow voids and vascular enhancements

Skull and upper cervical spine: No focal marrow lesion. Generally
hypointense marrow in the cervical spine.

Sinuses/Orbits: Negative.
IMPRESSION: 1. Small acute infarcts scattered in the infra and supratentorial
brain suggesting central embolic disease.
2. A few subtle areas of cortical enhancement likely reflecting
subacute infarcts. Small area of right cerebellar enhancement not
associated with restricted diffusion, possibly also subacute infarct
but recommend enhanced brain MRI follow-up in 6-12 weeks to rule out
metastatic disease.

## 2021-05-22 MED ORDER — ADENOSINE 6 MG/2ML IV SOLN
6.0000 mg | Freq: Once | INTRAVENOUS | Status: AC
  Administered 2021-05-22: 6 mg via INTRAVENOUS

## 2021-05-22 MED ORDER — LORAZEPAM 2 MG/ML IJ SOLN
0.5000 mg | INTRAMUSCULAR | Status: DC | PRN
  Administered 2021-05-22: 0.5 mg via INTRAVENOUS
  Administered 2021-05-23: 1 mg via INTRAVENOUS
  Administered 2021-05-23: 0.5 mg via INTRAVENOUS
  Filled 2021-05-22 (×3): qty 1

## 2021-05-22 MED ORDER — BIOTENE DRY MOUTH MT LIQD: 15.0000 mL | OROMUCOSAL | Status: DC | PRN

## 2021-05-22 MED ORDER — FENTANYL CITRATE (PF) 100 MCG/2ML IJ SOLN
12.5000 ug | INTRAMUSCULAR | Status: DC | PRN
  Administered 2021-05-22: 12.5 ug via INTRAVENOUS
  Filled 2021-05-22: qty 2

## 2021-05-22 MED ORDER — POTASSIUM CHLORIDE 10 MEQ/100ML IV SOLN
10.0000 meq | INTRAVENOUS | Status: AC
  Administered 2021-05-22 (×4): 10 meq via INTRAVENOUS
  Filled 2021-05-22 (×4): qty 100

## 2021-05-22 MED ORDER — SODIUM CHLORIDE 0.9% FLUSH
10.0000 mL | Freq: Two times a day (BID) | INTRAVENOUS | Status: DC
  Administered 2021-05-22: 10 mL

## 2021-05-22 MED ORDER — PERFLUTREN LIPID MICROSPHERE
1.0000 mL | INTRAVENOUS | Status: DC | PRN
  Administered 2021-05-22: 2 mL via INTRAVENOUS
  Filled 2021-05-22: qty 10

## 2021-05-22 MED ORDER — PHENYLEPHRINE CONCENTRATED 100MG/250ML (0.4 MG/ML) INFUSION SIMPLE
0.0000 ug/min | INTRAVENOUS | Status: DC
  Administered 2021-05-23: 195 ug/min via INTRAVENOUS
  Filled 2021-05-22 (×2): qty 250

## 2021-05-22 MED ORDER — LACTATED RINGERS IV BOLUS
500.0000 mL | Freq: Once | INTRAVENOUS | Status: AC
  Administered 2021-05-22: 500 mL via INTRAVENOUS

## 2021-05-22 MED ORDER — ADENOSINE 6 MG/2ML IV SOLN
INTRAVENOUS | Status: AC
  Filled 2021-05-22: qty 2

## 2021-05-22 MED ORDER — PHENYLEPHRINE HCL-NACL 20-0.9 MG/250ML-% IV SOLN
0.0000 ug/min | INTRAVENOUS | Status: DC
  Administered 2021-05-22 (×6): 240 ug/min via INTRAVENOUS
  Filled 2021-05-22 (×7): qty 250

## 2021-05-22 MED ORDER — ONDANSETRON 4 MG PO TBDP
4.0000 mg | ORAL_TABLET | Freq: Four times a day (QID) | ORAL | Status: DC | PRN
  Filled 2021-05-22: qty 1

## 2021-05-22 MED ORDER — SODIUM CHLORIDE 0.9% FLUSH: 10.0000 mL | INTRAVENOUS | Status: DC | PRN

## 2021-05-22 MED ORDER — POLYVINYL ALCOHOL 1.4 % OP SOLN
1.0000 [drp] | Freq: Four times a day (QID) | OPHTHALMIC | Status: DC | PRN
  Filled 2021-05-22: qty 15

## 2021-05-22 MED ORDER — CALCIUM GLUCONATE-NACL 1-0.675 GM/50ML-% IV SOLN
1.0000 g | Freq: Once | INTRAVENOUS | Status: AC
  Administered 2021-05-22: 1000 mg via INTRAVENOUS
  Filled 2021-05-22: qty 50

## 2021-05-22 MED ORDER — VASOPRESSIN 20 UNITS/100 ML INFUSION FOR SHOCK
0.0000 [IU]/min | INTRAVENOUS | Status: DC
  Administered 2021-05-22 (×2): 0.03 [IU]/min via INTRAVENOUS
  Filled 2021-05-22: qty 100

## 2021-05-22 MED ORDER — ONDANSETRON HCL 4 MG/2ML IJ SOLN: 4.0000 mg | Freq: Four times a day (QID) | INTRAMUSCULAR | Status: DC | PRN

## 2021-05-22 MED ORDER — SODIUM CHLORIDE 0.9 % IV SOLN
1.0000 mg/h | INTRAVENOUS | Status: DC
  Administered 2021-05-23 (×2): 3 mg/h via INTRAVENOUS
  Administered 2021-05-23: 1 mg/h via INTRAVENOUS
  Filled 2021-05-22 (×3): qty 2.5

## 2021-05-22 MED ORDER — POTASSIUM PHOSPHATES 15 MMOLE/5ML IV SOLN
30.0000 mmol | Freq: Once | INTRAVENOUS | Status: DC
  Administered 2021-05-22: 30 mmol via INTRAVENOUS
  Filled 2021-05-22: qty 10

## 2021-05-22 MED ORDER — HYDROMORPHONE HCL 1 MG/ML IJ SOLN: 2.0000 mg | Freq: Once | INTRAMUSCULAR | Status: DC

## 2021-05-22 MED ORDER — GADOBUTROL 1 MMOL/ML IV SOLN
7.0000 mL | Freq: Once | INTRAVENOUS | Status: AC | PRN
  Administered 2021-05-22: 7 mL via INTRAVENOUS

## 2021-05-22 MED ORDER — STROKE: EARLY STAGES OF RECOVERY BOOK
Freq: Once | Status: AC
  Filled 2021-05-22: qty 1

## 2021-05-22 MED ORDER — GLYCOPYRROLATE 0.2 MG/ML IJ SOLN
0.4000 mg | INTRAMUSCULAR | Status: DC | PRN
  Administered 2021-05-22 – 2021-05-23 (×2): 0.4 mg via INTRAVENOUS
  Filled 2021-05-22 (×2): qty 2

## 2021-05-22 NOTE — Progress Notes (Signed)
Back from MRI at this time. HR in 150's. E-link contacted at this time. Will push adenosine per order. Continue to monitor.

## 2021-05-22 NOTE — Progress Notes (Signed)
Contacted MRI at this time. Will attempt to take patient down in 40 minutes.

## 2021-05-22 NOTE — Consult Note (Signed)
[facility] for Infectious Disease  Total days of antibiotics 2       Reason for Consult: MRSA bacteremia    Referring Physician: CHAMP-hunsucker  Principal Problem:   Sepsis ([place])    HPI: Paige Perry is a 46 y.o. 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 On follow-up imaging, she was found to have widespread metastatic disease 03/23/21. She was started on abraxane-pembrolizumab on 04/15/21 c/b neutropenic fever and FTT on 1/27-2/3-hospitalization where she had presumed cefepime -related drug rash.  She was last seen in dr Geralyn Flash office on 2/23 for weekly abraxane therapy. Tbili 13  at the time. VS showed hypotension and tachycardia (HR 155). On 2/24, started to have difficulty with vision, and severe backpain, thus taken to the ED for evaluation. In ED, she met code sepsis criteria, with temp 101.23F, hypotension, sBP 80s, HR 150s. Other lab abn include sodium of 121, K+2.9, AST 424. WBC 14K. LA 2.1. Tbili 4.8; started on empiric abtx of vanco, aztreonam, and metronidazole. Infectious work up revealed MRSA bacteremia thus far. She was diffusely weak on exam, and right gaze preference and left sided neglect. Imaging -showed right sided pleural effusion. Brain imaging showed small acute infarcts scattered in the infra and supratentorial brain and right cebellar enhancement. Given that she also has MRSA bacteremia concern that this is due septic emboli. She has multiple family at bedside to discuss goals of care. She still requires vasopressors, and IV abtx presently.  Past Medical History:  Diagnosis Date   Asthma    Cancer ([place])    breast   Family history of breast cancer    Family history of breast cancer    Family history of prostate cancer    Family history of throat cancer    Hypercholesteremia    Hypokalemia  04/21/2021   PONV (postoperative nausea and vomiting)     Allergies:  Allergies  Allergen Reactions   Other     Corn: Unknown   Adhesive [Tape] Rash   Cefepime Rash   Latex Rash   Lovenox [Enoxaparin] Itching    Per patient when giving injection it causes her to itch   Tapentadol Rash   MEDICATIONS:  Chlorhexidine Gluconate Cloth  6 each Topical Daily   heparin  5,000 Units Subcutaneous Q8H   sodium chloride flush  10-40 mL Intracatheter Q12H    Social History   Tobacco Use   Smoking status: Never   Smokeless tobacco: Never  Vaping Use   Vaping Use: Never used  Substance Use Topics   Alcohol use: Yes    Comment: occasionally   Drug use: No    Family History  Problem Relation Age of Onset   Hypertension Mother    Heart failure Mother    Breast cancer Maternal Aunt        dx 73s   Breast cancer Paternal Aunt    Prostate cancer Father 14   Throat cancer Cousin     Review of Systems -  Unable to obtain since patient is obtunded/sedated  OBJECTIVE: Temp:  [97.2 F (36.2 C)-103.3 F (39.6 C)] 100.8 F (38.2 C) (02/25 0645) Pulse Rate:  [119-167] 132 (02/25 0845) Resp:  [0-51] 3 (02/25 0845) BP: (74-139)/(54-119) 74/60 (02/25 0400) SpO2:  [93 %-100 %] 97 % (02/25 0845) Arterial Line BP: (84-119)/(48-72) 87/52 (02/25 0845) Weight:  [  71.5 kg] 71.5 kg (02/25 0142) Physical Exam  Constitutional:  appears well-developed and well-nourished. No distress. sleeping HENT: [place]/AT, PERRLA, no scleral icterus Mouth/Throat: Oropharynx is clear and moist. No oropharyngeal exudate.  Cardiovascular: tachy, regular rhythm and normal heart sounds. Exam reveals no gallop and no friction rub.  No murmur heard.  Pulmonary/Chest: Effort normal and breath sounds normal. No respiratory distress.  has no wheezes.  Neck = supple, no nuchal rigidity Abdominal: Soft. Bowel sounds are decreased.  exhibits no distension. There is no tenderness.  Ext = + 2 edema. No c/c Neurological:  right gaze preference. Does not follow commands Skin: flaky diffuse scaly rash to torso, trunk, and thighs mostly Psychiatric: unable to assess   LABS: Results for orders placed or performed during the hospital encounter of 05/14/2021 (from the past 48 hour(s))  Urinalysis, Routine w reflex microscopic Urine, Catheterized     Status: Abnormal   Collection Time: 05/15/2021 12:51 PM  Result Value Ref Range   Color, Urine AMBER (A) YELLOW    Comment: BIOCHEMICALS MAY BE AFFECTED BY COLOR   APPearance CLEAR CLEAR   Specific Gravity, Urine 1.011 1.005 - 1.030   pH 6.0 5.0 - 8.0   Glucose, UA NEGATIVE NEGATIVE mg/dL   Hgb urine dipstick SMALL (A) NEGATIVE   Bilirubin Urine NEGATIVE NEGATIVE   Ketones, ur 20 (A) NEGATIVE mg/dL   Protein, ur 30 (A) NEGATIVE mg/dL   Nitrite NEGATIVE NEGATIVE   Leukocytes,Ua NEGATIVE NEGATIVE   RBC / HPF 0-5 0 - 5 RBC/hpf   WBC, UA 6-10 0 - 5 WBC/hpf   Bacteria, UA RARE (A) NONE SEEN   Squamous Epithelial / LPF 0-5 0 - 5    Comment: Performed at [facility], [place] [address], [place],  [address]  Resp Panel by RT-PCR (Flu A&B, Covid) Nasopharyngeal Swab     Status: None   Collection Time: 05/04/2021  1:00 PM   Specimen: Nasopharyngeal Swab; Nasopharyngeal(NP) swabs in vial transport medium  Result Value Ref Range   SARS Coronavirus 2 by RT PCR NEGATIVE NEGATIVE    Comment: (NOTE) SARS-CoV-2 target nucleic acids are NOT DETECTED.  The SARS-CoV-2 RNA is generally detectable in upper respiratory specimens during the acute phase of infection. The lowest concentration of SARS-CoV-2 viral copies this assay can detect is 138 copies/mL. A negative result does not preclude SARS-Cov-2 infection and should not be used as the sole basis for treatment or other patient management decisions. A negative result may occur with  improper specimen collection/handling, submission of specimen other than nasopharyngeal swab, presence of viral mutation(s)  within the areas targeted by this assay, and inadequate number of viral copies(<138 copies/mL). A negative result must be combined with clinical observations, patient history, and epidemiological information. The expected result is Negative.  Fact Sheet for Patients:  EntrepreneurPulse.com.au  Fact Sheet for Healthcare Providers:  IncredibleEmployment.be  This test is no t yet approved or cleared by the [place] FDA and  has been authorized for detection and/or diagnosis of SARS-CoV-2 by FDA under an Emergency Use Authorization (EUA). This EUA will remain  in effect (meaning this test can be used) for the duration of the COVID-19 declaration under Section 564(b)(1) of the Act, 21 U.S.C.section 360bbb-3(b)(1), unless the authorization is terminated  or revoked sooner.       Influenza A by PCR NEGATIVE NEGATIVE   Influenza B by PCR NEGATIVE NEGATIVE    Comment: (NOTE) The Xpert Xpress SARS-CoV-2/FLU/RSV plus assay is intended as  an aid in the diagnosis of influenza from Nasopharyngeal swab specimens and should not be used as a sole basis for treatment. Nasal washings and aspirates are unacceptable for Xpert Xpress SARS-CoV-2/FLU/RSV testing.  Fact Sheet for Patients: EntrepreneurPulse.com.au  Fact Sheet for Healthcare Providers: IncredibleEmployment.be  This test is not yet approved or cleared by the [place] FDA and has been authorized for detection and/or diagnosis of SARS-CoV-2 by FDA under an Emergency Use Authorization (EUA). This EUA will remain in effect (meaning this test can be used) for the duration of the COVID-19 declaration under Section 564(b)(1) of the Act, 21 U.S.C. section 360bbb-3(b)(1), unless the authorization is terminated or revoked.  Performed at [facility], [place] [address] [place], [place] [address]   Comprehensive metabolic panel     Status:  Abnormal   Collection Time: 05/25/2021  1:15 PM  Result Value Ref Range   Sodium 121 (L) 135 - 145 mmol/L   Potassium 2.5 (LL) 3.5 - 5.1 mmol/L    Comment: CRITICAL RESULT CALLED TO, READ BACK BY AND VERIFIED WITH: S.LAMB, RN AT 1419 ON 02.24.23 BY N.THOMPSON    Chloride 84 (L) 98 - 111 mmol/L   CO2 26 22 - 32 mmol/L   Glucose, Bld 112 (H) 70 - 99 mg/dL    Comment: Glucose reference range applies only to samples taken after fasting for at least 8 hours.   BUN 6 6 - 20 mg/dL   Creatinine, Ser 0.42 (L) 0.44 - 1.00 mg/dL   Calcium 7.6 (L) 8.9 - 10.3 mg/dL   Total Protein 6.1 (L) 6.5 - 8.1 g/dL   Albumin 1.9 (L) 3.5 - 5.0 g/dL   AST 403 (H) 15 - 41 U/L   ALT 216 (H) 0 - 44 U/L   Alkaline Phosphatase 361 (H) 38 - 126 U/L   Total Bilirubin 4.8 (H) 0.3 - 1.2 mg/dL   GFR, Estimated >60 >60 mL/min    Comment: (NOTE) Calculated using the CKD-EPI Creatinine Equation (2021)    Anion gap 11 5 - 15    Comment: Performed at [facility], [place] [address], [place], [place] [address]  CBC WITH DIFFERENTIAL     Status: Abnormal   Collection Time: 04/30/2021  1:15 PM  Result Value Ref Range   WBC 12.3 (H) 4.0 - 10.5 K/uL   RBC 2.99 (L) 3.87 - 5.11 MIL/uL   Hemoglobin 8.6 (L) 12.0 - 15.0 g/dL   HCT 26.1 (L) 36.0 - 46.0 %   MCV 87.3 80.0 - 100.0 fL   MCH 28.8 26.0 - 34.0 pg   MCHC 33.0 30.0 - 36.0 g/dL   RDW 18.1 (H) 11.5 - 15.5 %   Platelets 194 150 - 400 K/uL   nRBC 0.4 (H) 0.0 - 0.2 %   Neutrophils Relative % 92 %   Neutro Abs 11.3 (H) 1.7 - 7.7 K/uL   Lymphocytes Relative 8 %   Lymphs Abs 1.0 0.7 - 4.0 K/uL   Monocytes Relative 0 %   Monocytes Absolute 0.0 (L) 0.1 - 1.0 K/uL   Eosinophils Relative 0 %   Eosinophils Absolute 0.0 0.0 - 0.5 K/uL   Basophils Relative 0 %   Basophils Absolute 0.0 0.0 - 0.1 K/uL   WBC Morphology VACUOLATED NEUTROPHILS    Abs Immature Granulocytes 0.00 0.00 - 0.07 K/uL   Polychromasia PRESENT    Target Cells PRESENT     Comment: Performed  at [facility], [place] [address] [place],  [place] [address]  Protime-INR     Status: Abnormal   Collection Time: 05/03/2021  1:15 PM  Result Value Ref Range   Prothrombin Time 18.6 (H) 11.4 - 15.2 seconds   INR 1.6 (H) 0.8 - 1.2    Comment: (NOTE) INR goal varies based on device and disease states. Performed at [facility], [place] [address] [place], [place] [address]   APTT     Status: Abnormal   Collection Time: 05/12/2021  1:15 PM  Result Value Ref Range   aPTT 46 (H) 24 - 36 seconds    Comment:        IF BASELINE aPTT IS ELEVATED, SUGGEST PATIENT RISK ASSESSMENT BE USED TO DETERMINE APPROPRIATE ANTICOAGULANT THERAPY. Performed at [facility], [place] [address] [place], [place] [address]   Blood Culture (routine x 2)     Status: None (Preliminary result)   Collection Time: 05/22/2021  1:15 PM   Specimen: BLOOD  Result Value Ref Range   Specimen Description      BLOOD LEFT ANTECUBITAL Performed at [place] [facility] [address], [place], [place] [address]    Special Requests      BOTTLES DRAWN AEROBIC AND ANAEROBIC Blood Culture adequate volume Performed at [place] [address], [place], [place] [address]    Culture  Setup Time      GRAM POSITIVE COCCI AEROBIC BOTTLE ONLY IN BOTH AEROBIC AND ANAEROBIC BOTTLES CRITICAL RESULT CALLED TO, READ BACK BY AND VERIFIED WITH: PHARMD JAMES LEDFORD 05/22/21@6 :08 BY TW Performed at [facility], [place] [address] [place], [place] [address]    Culture GRAM POSITIVE COCCI    Report Status PENDING   Blood Culture (routine x 2)     Status: None (Preliminary result)   Collection Time: 05/15/2021  1:15 PM   Specimen: BLOOD  Result Value Ref Range   Specimen Description      BLOOD Performed at [facility], [place] [address], [place], [place] [address]    Special Requests      BOTTLES DRAWN AEROBIC AND ANAEROBIC Blood  Culture adequate volume Performed at [place] [address] [place], [place] [address]    Culture      NO GROWTH < 24 HOURS Performed at [facility] [address] [place], [place] [address]    Report Status PENDING   Magnesium     Status: Abnormal   Collection Time: 05/22/2021  1:15 PM  Result Value Ref Range   Magnesium 1.6 (L) 1.7 - 2.4 mg/dL    Comment: Performed at [facility], [place] [address], [place],  [address]  Blood Culture ID Panel (Reflexed)     Status: Abnormal   Collection Time: 05/02/2021  1:15 PM  Result Value Ref Range   Enterococcus faecalis NOT DETECTED NOT DETECTED   Enterococcus Faecium NOT DETECTED NOT DETECTED   Listeria monocytogenes NOT DETECTED NOT DETECTED   Staphylococcus species DETECTED (A) NOT DETECTED    Comment: CRITICAL RESULT CALLED TO, READ BACK BY AND VERIFIED WITH: PHARMD JAMES LEDFORD 05/22/21@6 :08 BY TW    Staphylococcus aureus (BCID) DETECTED (A) NOT DETECTED    Comment: Methicillin (oxacillin)-resistant Staphylococcus aureus (MRSA). MRSA is predictably resistant to beta-lactam antibiotics (except ceftaroline). Preferred therapy is vancomycin unless clinically contraindicated. Patient requires contact precautions if  hospitalized. CRITICAL RESULT CALLED TO, READ BACK BY AND VERIFIED WITH: PHARMD JAMES LEDFORD 05/22/21@6 :08 BY TW  Staphylococcus epidermidis NOT DETECTED NOT DETECTED   Staphylococcus lugdunensis NOT DETECTED NOT DETECTED   Streptococcus species NOT DETECTED NOT DETECTED   Streptococcus agalactiae NOT DETECTED NOT DETECTED   Streptococcus pneumoniae NOT DETECTED NOT DETECTED   Streptococcus pyogenes NOT DETECTED NOT DETECTED   A.calcoaceticus-baumannii NOT DETECTED NOT DETECTED   Bacteroides fragilis NOT DETECTED NOT DETECTED   Enterobacterales NOT DETECTED NOT DETECTED   Enterobacter cloacae complex NOT DETECTED NOT DETECTED   Escherichia coli NOT DETECTED NOT  DETECTED   Klebsiella aerogenes NOT DETECTED NOT DETECTED   Klebsiella oxytoca NOT DETECTED NOT DETECTED   Klebsiella pneumoniae NOT DETECTED NOT DETECTED   Proteus species NOT DETECTED NOT DETECTED   Salmonella species NOT DETECTED NOT DETECTED   Serratia marcescens NOT DETECTED NOT DETECTED   Haemophilus influenzae NOT DETECTED NOT DETECTED   Neisseria meningitidis NOT DETECTED NOT DETECTED   Pseudomonas aeruginosa NOT DETECTED NOT DETECTED   Stenotrophomonas maltophilia NOT DETECTED NOT DETECTED   Candida albicans NOT DETECTED NOT DETECTED   Candida auris NOT DETECTED NOT DETECTED   Candida glabrata NOT DETECTED NOT DETECTED   Candida krusei NOT DETECTED NOT DETECTED   Candida parapsilosis NOT DETECTED NOT DETECTED   Candida tropicalis NOT DETECTED NOT DETECTED   Cryptococcus neoformans/gattii NOT DETECTED NOT DETECTED   Meth resistant mecA/C and MREJ DETECTED (A) NOT DETECTED    Comment: CRITICAL RESULT CALLED TO, READ BACK BY AND VERIFIED WITH: PHARMD JAMES LEDFORD 05/22/21@6 :08 BY TW Performed at [facility], [address], [place], [place] [address]   Lactic acid, plasma     Status: Abnormal   Collection Time: 05/07/2021  1:16 PM  Result Value Ref Range   Lactic Acid, Venous 2.1 (HH) 0.5 - 1.9 mmol/L    Comment: CRITICAL RESULT CALLED TO, READ BACK BY AND VERIFIED WITH: S.LAMB, RN AT 1419 ON 02.24.23 BY N.THOMPSON Performed at [facility], [place] [address], [place], [place] [address]   I-Stat beta hCG blood, ED     Status: None   Collection Time: 05/20/2021  1:31 PM  Result Value Ref Range   I-stat hCG, quantitative <5.0 <5 mIU/mL   Comment 3            Comment:   GEST. AGE      CONC.  (mIU/mL)   <=1 WEEK        5 - 50     2 WEEKS       50 - 500     3 WEEKS       100 - 10,000     4 WEEKS     1,000 - 30,000        FEMALE AND NON-PREGNANT FEMALE:     LESS THAN 5 mIU/mL   Lactic acid, plasma     Status: None   Collection Time: 05/20/2021  3:37 PM   Result Value Ref Range   Lactic Acid, Venous 1.8 0.5 - 1.9 mmol/L    Comment: Performed at [facility], [place] [address] [place], [place] [address]  CBC     Status: Abnormal   Collection Time: 05/02/2021  4:48 PM  Result Value Ref Range   WBC 10.6 (H) 4.0 - 10.5 K/uL   RBC 2.81 (L) 3.87 - 5.11 MIL/uL   Hemoglobin 8.3 (L) 12.0 - 15.0 g/dL   HCT 24.5 (L) 36.0 - 46.0 %   MCV 87.2 80.0 - 100.0 fL   MCH 29.5 26.0 - 34.0 pg  MCHC 33.9 30.0 - 36.0 g/dL   RDW 17.9 (H) 11.5 - 15.5 %   Platelets 158 150 - 400 K/uL   nRBC 0.6 (H) 0.0 - 0.2 %    Comment: Performed at [facility], [place] [address], [place], [place] [address]  Creatinine, serum     Status: Abnormal   Collection Time: 05/25/2021  4:48 PM  Result Value Ref Range   Creatinine, Ser <0.30 (L) 0.44 - 1.00 mg/dL   GFR, Estimated NOT CALCULATED >60 mL/min    Comment: (NOTE) Calculated using the CKD-EPI Creatinine Equation (2021) Performed at [facility], [place] [address], [place], [place] [address]   Glucose, capillary     Status: Abnormal   Collection Time: 05/13/2021  9:25 PM  Result Value Ref Range   Glucose-Capillary 100 (H) 70 - 99 mg/dL    Comment: Glucose reference range applies only to samples taken after fasting for at least 8 hours.  MRSA Next Gen by PCR, Nasal     Status: None   Collection Time: 05/14/2021  9:36 PM   Specimen: Nasal Mucosa; Nasal Swab  Result Value Ref Range   MRSA by PCR Next Gen NOT DETECTED NOT DETECTED    Comment: (NOTE) The GeneXpert MRSA Assay (FDA approved for NASAL specimens only), is one component of a comprehensive MRSA colonization surveillance program. It is not intended to diagnose MRSA infection nor to guide or monitor treatment for MRSA infections. Test performance is not FDA approved in patients less than 45 years old. Performed at [facility], [place] [address] [place], [place] [address]   Glucose, capillary     Status:  Abnormal   Collection Time: 05/11/2021 11:31 PM  Result Value Ref Range   Glucose-Capillary 111 (H) 70 - 99 mg/dL    Comment: Glucose reference range applies only to samples taken after fasting for at least 8 hours.  I-STAT 7, (LYTES, BLD GAS, ICA, H+H)     Status: Abnormal   Collection Time: 05/22/21 12:35 AM  Result Value Ref Range   pH, Arterial 7.447 7.35 - 7.45   pCO2 arterial 26.9 (L) 32 - 48 mmHg   pO2, Arterial 101 83 - 108 mmHg   Bicarbonate 18.6 (L) 20.0 - 28.0 mmol/L   TCO2 19 (L) 22 - 32 mmol/L   O2 Saturation 98 %   Acid-base deficit 5.0 (H) 0.0 - 2.0 mmol/L   Sodium 128 (L) 135 - 145 mmol/L   Potassium 3.1 (L) 3.5 - 5.1 mmol/L   Calcium, Ion 1.02 (L) 1.15 - 1.40 mmol/L   HCT 24.0 (L) 36.0 - 46.0 %   Hemoglobin 8.2 (L) 12.0 - 15.0 g/dL   Patient temperature 37.0 C    Collection site art line    Drawn by HIDE    Sample type ARTERIAL   CBC     Status: Abnormal   Collection Time: 05/22/21  3:00 AM  Result Value Ref Range   WBC 7.7 4.0 - 10.5 K/uL   RBC 2.82 (L) 3.87 - 5.11 MIL/uL   Hemoglobin 8.4 (L) 12.0 - 15.0 g/dL   HCT 24.9 (L) 36.0 - 46.0 %   MCV 88.3 80.0 - 100.0 fL   MCH 29.8 26.0 - 34.0 pg   MCHC 33.7 30.0 - 36.0 g/dL   RDW 18.4 (H) 11.5 - 15.5 %   Platelets 135 (L) 150 - 400 K/uL    Comment: Immature Platelet Fraction may be clinically indicated, consider ordering this additional  test [id number]    nRBC 0.5 (H) 0.0 - 0.2 %    Comment: Performed at [facility], [place] [address] [place], [place] [address]  Basic metabolic panel     Status: Abnormal   Collection Time: 05/22/21  3:00 AM  Result Value Ref Range   Sodium 124 (L) 135 - 145 mmol/L   Potassium 2.8 (L) 3.5 - 5.1 mmol/L   Chloride 95 (L) 98 - 111 mmol/L   CO2 15 (L) 22 - 32 mmol/L   Glucose, Bld 201 (H) 70 - 99 mg/dL    Comment: Glucose reference range applies only to samples taken after fasting for at least 8 hours.   BUN <5 (L) 6 - 20 mg/dL   Creatinine, Ser 0.46 0.44 - 1.00 mg/dL    Calcium 6.9 (L) 8.9 - 10.3 mg/dL   GFR, Estimated >60 >60 mL/min    Comment: (NOTE) Calculated using the CKD-EPI Creatinine Equation (2021)    Anion gap 14 5 - 15    Comment: Performed at [facility] [address], [place], [place] [address]  Magnesium     Status: None   Collection Time: 05/22/21  3:00 AM  Result Value Ref Range   Magnesium 2.2 1.7 - 2.4 mg/dL    Comment: Performed at [facility] [address], [place], [place] [address]  Phosphorus     Status: Abnormal   Collection Time: 05/22/21  3:00 AM  Result Value Ref Range   Phosphorus 1.7 (L) 2.5 - 4.6 mg/dL    Comment: Performed at [facility] [address] [place], [place] [address]  Glucose, capillary     Status: Abnormal   Collection Time: 05/22/21  3:28 AM  Result Value Ref Range   Glucose-Capillary 168 (H) 70 - 99 mg/dL    Comment: Glucose reference range applies only to samples taken after fasting for at least 8 hours.  Glucose, capillary     Status: Abnormal   Collection Time: 05/22/21  7:31 AM  Result Value Ref Range   Glucose-Capillary 132 (H) 70 - 99 mg/dL    Comment: Glucose reference range applies only to samples taken after fasting for at least 8 hours.    MICRO: Blood cx 2/24- MRSA bacteremia IMAGING: CT Head Wo Contrast  Result Date: 05/24/2021 CLINICAL DATA:  Brain metastases suspected EXAM: CT HEAD WITHOUT CONTRAST TECHNIQUE: Contiguous axial images were obtained from the base of the skull through the vertex without intravenous contrast. RADIATION DOSE REDUCTION: This exam was performed according to the departmental dose-optimization program which includes automated exposure control, adjustment of the mA and/or kV according to patient size and/or use of iterative reconstruction technique. COMPARISON:  None. FINDINGS: Brain: No evidence of acute intracranial hemorrhage or extra-axial collection.There is focal hypoattenuation and loss of gray-white matter differentiation within the  right parieto-occipital region. No concerning mass effect. The ventricles are normal in size. Vascular: No hyperdense vessel or unexpected calcification. Skull: Negative for fracture. Sinuses/Orbits: Rightward gaze of the right eye. The lens is not within the field of view of the left eye. The orbits are otherwise unremarkable. Paranasal sinuses are predominantly clear. Other: None. IMPRESSION: Focal hypoattenuation in the right parieto-occipital region favored to represent acute infarct. Given history of malignancy, an underlying metastasis is possible. Recommend MRI. Critical Value/emergent results were called by telephone at the time of interpretation on 05/25/2021 at 1:52 pm to provider [facility] RAY , who verbally acknowledged these results. Electronically Signed  By: Maurine Simmering M.D.   On: 05/08/2021 13:54   MR BRAIN W WO CONTRAST  Result Date: 05/22/2021 CLINICAL DATA:  Neuro deficit with acute stroke suspected. Metastatic breast cancer. EXAM: MRI HEAD WITHOUT AND WITH CONTRAST TECHNIQUE: Multiplanar, multiecho pulse sequences of the brain and surrounding structures were obtained without and with intravenous contrast. CONTRAST:  11m GADAVIST GADOBUTROL 1 MMOL/ML IV SOLN COMPARISON:  Head CT from yesterday. FINDINGS: Brain: Patchy foci of restricted diffusion in the left cerebellum, high right frontal, bilateral parietal, and right more than left occipital cortex. Mild involvement in the deep right cerebral white matter and caudate body. Mild petechial hemorrhage associated with right convexity infarcts. A few areas of faint cortically based enhancement are suggested in the occipital and parietal cortex on coronal and sagittal postcontrast imaging, but limited by motion. More linear indefinite enhancement along the surface of the right cerebellum where there is no restricted diffusion. No hydrocephalus, collection, or brain atrophy Vascular: Normal flow voids and vascular enhancements Skull and upper  cervical spine: No focal marrow lesion. Generally hypointense marrow in the cervical spine. Sinuses/Orbits: Negative. IMPRESSION: 1. Small acute infarcts scattered in the infra and supratentorial brain suggesting central embolic disease. 2. A few subtle areas of cortical enhancement likely reflecting subacute infarcts. Small area of right cerebellar enhancement not associated with restricted diffusion, possibly also subacute infarct but recommend enhanced brain MRI follow-up in 6-12 weeks to rule out metastatic disease. Electronically Signed   By: JJorje GuildM.D.   On: 05/22/2021 06:16   DG Chest Port 1 View  Result Date: 05/12/2021 CLINICAL DATA:  Questionable sepsis - evaluate for abnormality EXAM: PORTABLE CHEST 1 VIEW COMPARISON:  04/28/2021 FINDINGS: Right-sided chest port remains in place. Normal heart size. Moderate right-sided pleural effusion, increased from prior. Slightly prominent bilateral interstitial markings. No left-sided pleural effusion. No pneumothorax. Surgical clips in the left axilla. IMPRESSION: 1. Moderate right-sided pleural effusion, increased from prior. 2. Slightly prominent bilateral interstitial markings, possibly mild edema. Electronically Signed   By: NDavina PokeD.O.   On: 05/12/2021 13:09   EEG adult  Result Date: 05/22/2021 KGreta Doom MD     05/22/2021  9:03 AM History: 46yo F with gaze deviation Sedation: None Technique: This EEG was acquired with electrodes placed according to the International 10-20 electrode system (including Fp1, Fp2, F3, F4, C3, C4, P3, P4, O1, O2, T3, T4, T5, T6, A1, A2, Fz, Cz, Pz). The following electrodes were missing or displaced: none. Background: The background is low voltage, and consists of intermixed alpha and beta activities. There is a well defined posterior dominant rhythm of 9 Hz that attenuates with eye opening, and is better seen on the left than right. Sleep is recorded with normal appearing sleep structures.  Photic stimulation: Physiologic driving is not performed EEG Abnormalities: 1) Asymmetric PDR Clinical Interpretation: This EEG is most consistent with a focal right hemispheric cortical dysfunction as can be seen with ischemic stroke, space-occupying lesions, or other cortical insult. There was no seizure or seizure predisposition recorded on this study. Please note that lack of epileptiform activity on EEG does not preclude the possibility of epilepsy. MRoland Rack MD [facility] [phone] 7pm- 7am, please page neurology on call as listed in [place]   CT ANGIO HEAD NECK W WO CM W PERF  Result Date: 05/09/2021 CLINICAL DATA:  Neuro deficit, acute, stroke suspected EXAM: CT ANGIOGRAPHY HEAD AND NECK TECHNIQUE: Multidetector CT imaging of the head and neck was performed  using the standard protocol during bolus administration of intravenous contrast. Multiplanar CT image reconstructions and MIPs were obtained to evaluate the vascular anatomy. Carotid stenosis measurements (when applicable) are obtained utilizing NASCET criteria, using the distal internal carotid diameter as the denominator. RADIATION DOSE REDUCTION: This exam was performed according to the departmental dose-optimization program which includes automated exposure control, adjustment of the mA and/or kV according to patient size and/or use of iterative reconstruction technique. CONTRAST:  129m OMNIPAQUE IOHEXOL 350 MG/ML SOLN COMPARISON:  Same day CT head. FINDINGS: CTA NECK FINDINGS Aortic arch: Great vessel origins are patent without significant stenosis. Right carotid system: No evidence of dissection, stenosis (50% or greater) or occlusion. Left carotid system: No evidence of dissection, stenosis (50% or greater) or occlusion. Mild narrowing of the ICA origin without greater than 50% stenosis. Vertebral arteries: Mildly right dominant. No evidence of dissection, stenosis (50% or greater) or occlusion. Skeleton: Moderate  multilevel degenerative change in the cervical spine. Other neck: No evidence of acute abnormality on limited assessment. Upper chest: Large right pleural effusion with overlying atelectasis or consolidation, partially imaged. Review of the MIP images confirms the above findings CTA HEAD FINDINGS Limited by venous contrast timing and mild motion. Within this limitation: Anterior circulation: Bilateral intracranial ICAs, MCAs, and ACAs are patent without proximal hemodynamically significant stenosis. No aneurysm identified. Posterior circulation: Bilateral intradural vertebral arteries, basilar artery, and posterior cerebral arteries are patent without proximal hemodynamically significant stenosis. No aneurysm identified. Venous sinuses: As permitted by contrast timing, patent. Review of the MIP images confirms the above findings IMPRESSION: 1. No large vessel occlusion or proximal hemodynamically significant stenosis in the head or neck. 2. Partially imaged large right pleural effusion with overlying atelectasis or consolidation. Electronically Signed   By: FMargaretha SheffieldM.D.   On: 05/22/2021 16:35    Assessment/Plan:  46yo F immunocompromised host(metastatic breast ca) admitted with severe sepsis related to MRSA bacteremia with probable CNS septic emboli.   - recommend to narrow to vancomycin for the time being - given right parietal-occipital CVA- that could be c/w septic emboli - recommend TTE  Right sided effusion = defer to primary team utility to repeat thoracentesis to send for culture and cytology  Metastatic breast ca = goals of care discussion with palliative care over the weekend  Prognosis =guarded/poor given advanced malignancy and deconditioning.

## 2021-05-22 NOTE — Progress Notes (Signed)
E-Link notified of recent labs and K+ of 2.8

## 2021-05-22 NOTE — Progress Notes (Signed)
PHARMACY - PHYSICIAN COMMUNICATION CRITICAL VALUE ALERT - BLOOD CULTURE IDENTIFICATION (BCID)  Paige Perry is an 46 y.o. female who presented to [facility] on 05/18/2021 with a chief complaint of fever, tachycardia, hypotension  Name of physician (or Provider) Contacted: Dr. Desree Leap Ivanoff  Current antibiotics: Vancomycin/Aztreonam   Changes to prescribed antibiotics recommended:  No changes  Results for orders placed or performed during the hospital encounter of 05/06/2021  Blood Culture ID Panel (Reflexed) (Collected: 04/29/2021  1:15 PM)  Result Value Ref Range   Enterococcus faecalis NOT DETECTED NOT DETECTED   Enterococcus Faecium NOT DETECTED NOT DETECTED   Listeria monocytogenes NOT DETECTED NOT DETECTED   Staphylococcus species DETECTED (A) NOT DETECTED   Staphylococcus aureus (BCID) DETECTED (A) NOT DETECTED   Staphylococcus epidermidis NOT DETECTED NOT DETECTED   Staphylococcus lugdunensis NOT DETECTED NOT DETECTED   Streptococcus species NOT DETECTED NOT DETECTED   Streptococcus agalactiae NOT DETECTED NOT DETECTED   Streptococcus pneumoniae NOT DETECTED NOT DETECTED   Streptococcus pyogenes NOT DETECTED NOT DETECTED   A.calcoaceticus-baumannii NOT DETECTED NOT DETECTED   Bacteroides fragilis NOT DETECTED NOT DETECTED   Enterobacterales NOT DETECTED NOT DETECTED   Enterobacter cloacae complex NOT DETECTED NOT DETECTED   Escherichia coli NOT DETECTED NOT DETECTED   Klebsiella aerogenes NOT DETECTED NOT DETECTED   Klebsiella oxytoca NOT DETECTED NOT DETECTED   Klebsiella pneumoniae NOT DETECTED NOT DETECTED   Proteus species NOT DETECTED NOT DETECTED   Salmonella species NOT DETECTED NOT DETECTED   Serratia marcescens NOT DETECTED NOT DETECTED   Haemophilus influenzae NOT DETECTED NOT DETECTED   Neisseria meningitidis NOT DETECTED NOT DETECTED   Pseudomonas aeruginosa NOT DETECTED NOT DETECTED   Stenotrophomonas maltophilia NOT DETECTED NOT DETECTED   Candida albicans NOT  DETECTED NOT DETECTED   Candida auris NOT DETECTED NOT DETECTED   Candida glabrata NOT DETECTED NOT DETECTED   Candida krusei NOT DETECTED NOT DETECTED   Candida parapsilosis NOT DETECTED NOT DETECTED   Candida tropicalis NOT DETECTED NOT DETECTED   Cryptococcus neoformans/gattii NOT DETECTED NOT DETECTED   Meth resistant mecA/C and MREJ DETECTED (A) NOT DETECTED    [name] 05/22/2021  6:46 AM

## 2021-05-22 NOTE — Progress Notes (Signed)
PT Cancellation Note  Patient Details Name: Paige Perry MRN: [id number] DOB: 1975/10/05   Cancelled Treatment:    Reason Eval/Treat Not Completed: Medical issues which prohibited therapy (discussed with RN; pt hypotensive on multiple pressors and tachycardic, pending EEG. Will check back in when pt appropriate to participate in therapy evaluation).  Wyona Almas, PT, DPT Acute Rehabilitation Services Pager [phone] Office [phone]    Deno Etienne 05/22/2021, 7:59 AM

## 2021-05-22 NOTE — Procedures (Signed)
Arterial Catheter Insertion Procedure Note  Paige Perry  [id number]  [birth year]  Date:05/22/21  Time:12:33 AM    Provider Performing: Ulice Dash    Procedure: Insertion of Arterial Line [phone]) with US guidance ([id number])   Indication(s) Blood pressure monitoring and/or need for frequent ABGs  Consent Unable to obtain consent due to emergent nature of procedure.  Anesthesia None   Time Out Verified patient identification, verified procedure, site/side was marked, verified correct patient position, special equipment/implants available, medications/allergies/relevant history reviewed, required imaging and test results available.   Sterile Technique Maximal sterile technique including full sterile barrier drape, hand hygiene, sterile gown, sterile gloves, mask, hair covering, sterile ultrasound probe cover (if used).   Procedure Description Area of catheter insertion was cleaned with chlorhexidine and draped in sterile fashion. With real-time ultrasound guidance an arterial catheter was placed into the left radial artery.  Appropriate arterial tracings confirmed on monitor.     Complications/Tolerance None; patient tolerated the procedure well.   EBL Minimal   Specimen(s) None

## 2021-05-22 NOTE — Progress Notes (Signed)
Contacted MRI at this time. Scheduled patient in MRI at this time. Possible wait time another hour. Will continue to monitor patient at this time.

## 2021-05-22 NOTE — Progress Notes (Signed)
Patient can swallow and take oral medication without difficulty.

## 2021-05-22 NOTE — Progress Notes (Deleted)
LTM EEG hooked up and running - no initial skin breakdown - push button tested - neuro notified. [facility] monitoring.  

## 2021-05-22 NOTE — Progress Notes (Signed)
Ready to head to MRI at this time.

## 2021-05-22 NOTE — Progress Notes (Signed)
NAME:  Paige Perry, MRN:  [id number], DOB:  May 17, 1975, LOS: 1 ADMISSION DATE:  05/05/2021, CONSULTATION DATE:  05/22/2021  REFERRING MD:  Jeanell Sparrow, EDP , CHIEF COMPLAINT: Fever and tachycardia  History of Present Illness:  46 year old with metastatic breast cancer to liver, recent hospitalization 1/26 to 2/3 for neutropenic fever and failure to thrive, on chemotherapy with Keytruda and Abraxane , persistent tachycardia , BIBEMS for fever 101.5, tachycardia 150s, hypotensive with systolic blood pressure in 80s. Initial labs showed hypokalemia 2.9, hyponatremia 121, elevated bilirubin 5.5, leukocytosis 14.1 K She was noted to have a right gaze preference, CT head suggestive of acute occipital CVA versus metastasis   Pertinent  Medical History  Breast cancer, initially diagnosed 2020, ER positive, HER2 positive, status post neoadjuvant chemotherapy, left lumpectomy, RT, presented again 12/2020 with metastases to liver, left chest wall, breast  2/1 thoracenteses >> malignant cells  Significant Hospital Events: Including procedures, antibiotic start and stop dates in addition to other pertinent events   1/31 diffuse erythematous rash?  Cefepime induced , oral ulcerations treated with Diflucan , treated with dexamethasone for?  Drug-induced pneumonitis-new groundglass opacities in left upper lobe and lower lobe 2/1 right thoracentesis >> malignant cells  Interim History / Subjective:  Appears critically ill, tachycardic 140s, encephalopathic intermittently follows commands  Objective   Blood pressure (!) 74/60, pulse (!) 137, temperature (!) 100.8 F (38.2 C), temperature source Bladder, resp. rate (!) 39, weight 71.5 kg, SpO2 95 %.        Intake/Output Summary (Last 24 hours) at 05/22/2021 1343 Last data filed at 05/22/2021 1130 Gross per 24 hour  Intake 5684.52 ml  Output 1395 ml  Net 4289.52 ml   Filed Weights   05/22/21 0142  Weight: 71.5 kg    Examination: General: Adult woman  appears older than stated age, moderate distress HENT: Mild icterus, pallor present, moist mucosa Lungs: Bilateral breath sounds clear, no accessory muscle use Cardiovascular: S1-S2 taccy, regular Abdomen: Soft, nontender Extremities: 2+ bipedal edema Neuro: follows commands, disoriented GU: Clear urine Skin: diffuse scaling rash over trunk upper arms and thigh  Chest x-ray dependently reviewed shows moderate right effusion, that is chronic  Resolved Hospital Problem list     Assessment & Plan:  MRSA bacteremia with septich shock Baseline persistent tachycardia noted during prior admission and hypotension requiring midodrine  -Lactate has cleared with IV fluids -Continue vancomycin -phenylephrine MAP goal > 65  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  I agree with this decision in light of multiple irreversible injuries.   Best Practice (right click and "Reselect all SmartList Selections" daily)   Diet/type: NPO DVT prophylaxis: prophylactic heparin  GI prophylaxis: N/A Lines: N/A Foley:  N/A Code Status:  full code Last date of multidisciplinary goals of care discussion [comfort care 2/25]  Labs   CBC: Recent Labs  Lab 05/20/21 1249 05/17/2021 1315 05/05/2021 1648 05/22/21 0035 05/22/21 0300  WBC 14.1* 12.3* 10.6*  --  7.7  NEUTROABS 10.4* 11.3*  --   --   --   HGB 9.3* 8.6* 8.3* 8.2* 8.4*  HCT 27.7* 26.1* 24.5* 24.0* 24.9*  MCV 86.6 87.3 87.2  --  88.3  PLT 291 194 158  --  135*     Basic Metabolic Panel: Recent Labs  Lab 05/20/21  1249 05/13/2021 1315 05/05/2021 1648 05/22/21 0035 05/22/21 0300  NA 121* 121*  --  128* 124*  K 2.9* 2.5*  --  3.1* 2.8*  CL 84* 84*  --   --  95*  CO2 29 26  --   --  15*  GLUCOSE 158* 112*   --   --  201*  BUN 7 6  --   --  <5*  CREATININE 0.31* 0.42* <0.30*  --  0.46  CALCIUM 8.3* 7.6*  --   --  6.9*  MG 1.4* 1.6*  --   --  2.2  PHOS  --   --   --   --  1.7*    GFR: Estimated Creatinine Clearance: 83.2 mL/min (by C-G formula based on SCr of 0.46 mg/dL). Recent Labs  Lab 05/20/21 1249 05/01/2021 1315 05/04/2021 1316 05/08/2021 1537 04/28/2021 1648 05/22/21 0300  WBC 14.1* 12.3*  --   --  10.6* 7.7  LATICACIDVEN  --   --  2.1* 1.8  --   --      Liver Function Tests: Recent Labs  Lab 05/20/21 1249 05/01/2021 1315  AST 424* 403*  ALT 243* 216*  ALKPHOS 483* 361*  BILITOT 5.5* 4.8*  PROT 6.7 6.1*  ALBUMIN 2.8* 1.9*    No results for input(s): LIPASE, AMYLASE in the last 168 hours. No results for input(s): AMMONIA in the last 168 hours.  ABG    Component Value Date/Time   PHART 7.447 05/22/2021 0035   PCO2ART 26.9 (L) 05/22/2021 0035   PO2ART 101 05/22/2021 0035   HCO3 18.6 (L) 05/22/2021 0035   TCO2 19 (L) 05/22/2021 0035   ACIDBASEDEF 5.0 (H) 05/22/2021 0035   O2SAT 98 05/22/2021 0035     Coagulation Profile: Recent Labs  Lab 05/24/2021 1315  INR 1.6*     Cardiac Enzymes: No results for input(s): CKTOTAL, CKMB, CKMBINDEX, TROPONINI in the last 168 hours.  HbA1C: No results found for: HGBA1C  CBG: Recent Labs  Lab 05/17/2021 2125 05/20/2021 2331 05/22/21 0328 05/22/21 0731 05/22/21 1139  GLUCAP 100* 111* 168* 132* 120*    Review of Systems:     Past Medical History:  She,  has a past medical history of Asthma, Cancer ([place]), Family history of breast cancer, Family history of breast cancer, Family history of prostate cancer, Family history of throat cancer, Hypercholesteremia, Hypokalemia (04/21/2021), and PONV (postoperative nausea and vomiting).   Surgical History:   Past Surgical History:  Procedure Laterality Date   AXILLARY LYMPH NODE DISSECTION Left 07/11/2019   Procedure: LEFT AXILLARY LYMPH NODE DISSECTION;  Surgeon: Donnie Mesa,  MD;  Location: [facility];  Service: General;  Laterality: Left;   BREAST LUMPECTOMY WITH RADIOACTIVE SEED AND AXILLARY LYMPH NODE DISSECTION Left 06/13/2019   Procedure: LEFT BREAST LUMPECTOMY WITH RADIOACTIVE SEED AND AXILLARY TARGETED LYMPH NODE DISSECTION;  Surgeon: Donnie Mesa, MD;  Location: [facility];  Service: General;  Laterality: Left;   FINGER SURGERY     PORTACATH PLACEMENT Right 02/05/2019   Procedure: INSERTION PORT-A-CATH WITH ULTRASOUND;  Surgeon: Donnie Mesa, MD;  Location: [facility];  Service: General;  Laterality: Right;   PORTACATH PLACEMENT Right 04/14/2021   Procedure: INSERTION PORT-A-CATH;  Surgeon: Donnie Mesa, MD;  Location: [facility];  Service: General;  Laterality: Right;   WISDOM TOOTH EXTRACTION     WOUND EXPLORATION Right 06/13/2019   Procedure: RIGHT CHEST WOUND EXPLORATION;  Surgeon: Donnie Mesa, MD;  Location: [facility];  Service: General;  Laterality: Right;     Social History:   reports that she has never smoked. She has never used smokeless tobacco. She reports current alcohol use. She reports that she does not use drugs.   Family History:  Her family history includes Breast cancer in her maternal aunt and paternal aunt; Heart failure in her mother; Hypertension in her mother; Prostate cancer (age of onset: 18) in her father; Throat cancer in her cousin.   Allergies Allergies  Allergen Reactions   Other     Corn: Unknown   Adhesive [Tape] Rash   Cefepime Rash   Latex Rash   Lovenox [Enoxaparin] Itching    Per patient when giving injection it causes her to itch   Tapentadol Rash     Home Medications  Prior to Admission medications   Medication Sig Start Date End Date Taking? Authorizing Provider  potassium chloride (MICRO-K) 10 MEQ CR capsule Take 2 capsules (20 mEq total) by mouth daily. 05/07/21   Nicholas Lose, MD  antiseptic oral rinse (BIOTENE) LIQD 15  mLs by Mouth Rinse route as needed for dry mouth. 04/30/21   Florencia Reasons, MD  feeding supplement (ENSURE ENLIVE / ENSURE PLUS) LIQD Take 237 mLs by mouth 3 (three) times daily between meals. 04/30/21   Florencia Reasons, MD  fentaNYL (DURAGESIC) 25 MCG/HR Place 1 patch onto the skin every 3 (three) days. 05/03/21   Gardenia Phlegm, NP  lidocaine (XYLOCAINE) 2 % solution Use as directed 15 mLs in the mouth or throat every 3 (three) hours as needed for mouth pain. 04/30/21   Florencia Reasons, MD  lidocaine-prilocaine (EMLA) cream Apply to affected area once Patient taking differently: 1 application daily as needed. Apply to affected area once 04/07/21   Nicholas Lose, MD  magic mouthwash SOLN Take 5 mLs by mouth 3 (three) times daily as needed for mouth pain. 05/13/21   Nicholas Lose, MD  magnesium oxide (MAG-OX) 400 (240 Mg) MG tablet Take 1 tablet (400 mg total) by mouth daily. 04/30/21   Florencia Reasons, MD  Multiple Vitamin (MULTIVITAMIN WITH MINERALS) TABS tablet Take 1 tablet by mouth daily. 05/01/21   Florencia Reasons, MD  polyethylene glycol (MIRALAX / GLYCOLAX) 17 g packet Take 17 g by mouth daily. 04/30/21   Florencia Reasons, MD  sodium chloride (OCEAN) 0.65 % SOLN nasal spray Place 1 spray into both nostrils as needed for congestion. 04/30/21   Florencia Reasons, MD     Critical care time:        Lanier Clam, MD See Amion for contact info  05/22/2021

## 2021-05-22 NOTE — Consult Note (Addendum)
Palliative Medicine Inpatient Consult Note  Consulting Provider: Pattricia Boss, MD  Reason for consult:   [facility] Palliative Medicine Consult  Reason for Consult? patient with new stroke, sepsis, metastatic breast ca   HPI:  Per intake H&P --> 47 year old with metastatic breast cancer to liver, recent hospitalization 1/26 to 2/3 for neutropenic fever and failure to thrive, on chemotherapy with Keytruda and Abraxane , persistent tachycardia , BIBEMS for fever 101.5, tachycardia 150s, hypotensive with systolic blood pressure in 80s.  Admitted to the ICU and placed on pressor support.  Patient identified to be septic as well as having small acute/subacute embolic infarcts.  Palliative care has been asked to discuss goals of care in the setting of severe illness and underlying incurable metastatic breast cancer.  Clinical Assessment/Goals of Care:  *Please note that this is a verbal dictation therefore any spelling or grammatical errors are due to the "[employer] One" system interpretation.  I have reviewed medical records including EPIC notes, labs and imaging, received report from bedside RN, assessed the patient who is somnolent does not open her eyes or follow commands for me this morning.    I called patient's mother, Inez Catalina and sister, [name] to further discuss diagnosis prognosis, [facility], EOL wishes, disposition and options.   I introduced Palliative Medicine as specialized medical care for people living with serious illness. It focuses on providing relief from the symptoms and stress of a serious illness. The goal is to improve quality of life for both the patient and the family.  Medical History Review and Understanding:  [name] and I reviewed Ashiyah's metastatic breast cancer.  She shares that it first appeared in 2020 and [place] had been under the impression that it had completely resolved and she was in remission.  A few months ago Terrance started having  some pain which was unrelenting and she went to her doctor for work-up and was identified to have metastatic breast cancer.  Since then she has been on palliative chemotherapy.  Has been seen regularly by the outpatient palliative care oncology clinic.  Social History:  Riely shares that she lives in the home with her sister, [name].  She has 2 children who she has a relationship with, one son who is 26 and one son who is 56. She has a one year older grandchild as well.  She worked for [employer] but is currently on leave.   Functional and Nutritional State:  Patient reports she is independent in ADLs although requires frequent rest breaks.  Is currently not driving due to weakness and fatigue.  States she is ambulatory in the home however gait seems to be more unsteady and is requesting walker for assistance and stability.  Advance Directives: A detailed discussion was had today regarding advanced directives.  Patient has not advance directives though per patients mother, her sister [name] is the person who has been her surrogate for decision making.   Code Status:  Encouraged [name] to consider DNR/DNI status understanding evidenced based poor outcomes in similar hospitalized patient, as the cause of arrest is likely associated with advanced chronic/terminal illness rather than an easily reversible acute cardio-pulmonary event. I explained that DNR/DNI does not change the medical plan and it only comes into effect after a person has arrested (died).  It is a protective measure to keep [place] from harming the patient in their last moments of life. [name] was agreeable to DNR/DNI for Alleyne with understanding that patient would not receive CPR, defibrillation, ACLS medications,  or intubation.   Discussion:  Reviewed the significance of patients current clinical state. Discussed that she is suffering from sepsis presently though the source is unlcear. Discussed that Ascencion has had multiple strokes and the  amount whereby Cici would be able to revocer from those is unclear. Reviewed Seema's recent battle with metastatic breast cancer and how aggressive this has been over the past two months.   Per patients sister, [name] she does not want her sister to suffer she shares that she has already been through enough.   [name] endorses that she would not want prolonged measures if not improvements were going to be seen.   Discussed the importance of continued conversation with family and their  medical providers regarding overall plan of care and treatment options, ensuring decisions are within the context of the patients values and GOCs. ______________________________________ Addendum:  I went to meet patients sister at bedside this morning though she had to leave to bring Jaydin's son to an appointment,  I was able to sit with patients mother, Inez Catalina. Inez Catalina shared with me that her husband died of metastatic pancreatic cancer. She reviewed that advanced cancer is sadly, not a new concept to she or her daughter, [name].   Inez Catalina shares that when [name] gets back they have some decisions to make. She also shares with me that she doesn't want to see Zhane suffer.  ______________________________________ Addendum #2:  I met with Elky's sister, [name] and her mother, Inez Catalina. We reviewed that patients eldest son trusts [name] to make decisions in Hasina's best interest. Discussed what an active and proud woman Akaila was. She went back and got her masters degree while going through chemo therapy the first time. She loves nature and the outdoors. She was an avid motorcyclist.   We reviewed Emon's metastatic breast cancer, her sepsis, her recent strokes. [name] shares that she "knew things had changed" a few days ago. She reviewed that Leslieanne has been very forthright that if she gets to a point where additional medical interventions are going to cause more burden than benefit than she would not wish for these.    We reviewed the paths we could take on one side we could choose to be aggressive working up sepsis and strokes with additional imaging studies and invasive diagnostics. Unfortunately, these would be unlikely to change where we will end up in a short time irregardless as Marleena's cancer is incurable.  We could alternatively focus on comfort. We talked about transition to comfort measures in house and what that would entail inclusive of medications to control pain, dyspnea, agitation, nausea, itching, and hiccups.  We discussed stopping all uneccessary measures such as cardiac monitoring, blood draws, needle sticks, and frequent vital signs.   Patients family are clear that if Zamira cannot be active and enjoy things such as motorcycle riding then she would not have quality in her life. They are very much in favor of a transition to comfort focused care.   Reflective listening used as a form of support.   Patients family confirm the decision to move towards comfort care we reviewed that we never know how long or short a patients time will be though Lizania's time will likely be very limited.   Declined chaplaincy in house as family plan to call their own loved one to offer prayer.   Decision Maker: Idelia Caudell (sister) [phone]  SUMMARY OF RECOMMENDATIONS   DNAR/DNI  Comfort Care  Will optimize patient comfort through the use of a dilaudid  gtt  Additional comfort medications per [facility]  Unrestricted visitation  Ongoing Palliative support  Code Status/Advance Care Planning: DNAR/DNI   Symptom Management:   Palliative Prophylaxis:  Aspiration, Bowel Regimen, Delirium Protocol, Frequent Pain Assessment, Oral Care, Palliative Wound Care, and Turn Reposition  Additional Recommendations (Limitations, Scope, Preferences): Comfort focused care  Psycho-social/Spiritual:  Desire for further Chaplaincy support:  Additional Recommendations:    Prognosis: Hours to days  Discharge  Planning: Discharge will be celestial  Vitals:   05/22/21 0730 05/22/21 0800  BP:    Pulse: (!) 147 (!) 142  Resp: (!) 27 (!) 32  Temp:    SpO2: 98% 98%    Intake/Output Summary (Last 24 hours) at 05/22/2021 0819 Last data filed at 05/22/2021 0700 Gross per 24 hour  Intake 5114.56 ml  Output 950 ml  Net 4164.56 ml   Last Weight  Most recent update: 05/22/2021  1:42 AM    Weight  71.5 kg (157 lb 10.1 oz)            Gen:  Middle aged 2 F chronically ill appearing HEENT: moist mucous membranes CV: Irregular rate and rhythm  PULM:  On RA ABD: soft/nontender  EXT: No edema  Neuro: Somnolent  PPS: 10%   This conversation/these recommendations were discussed with patient primary care team, Dr. Silas Venables  Total Time: 135  MDM High ______________________________________________________ [place] Team Team Cell Phone: [phone] Please utilize secure chat with additional questions, if there is no response within 30 minutes please call the above phone number  Palliative Medicine Team providers are available by phone from 7am to 7pm daily and can be reached through the team cell phone.  Should this patient require assistance outside of these hours, please call the patient's attending physician.

## 2021-05-22 NOTE — Progress Notes (Signed)
OT Cancellation Note  Patient Details Name: Paige Perry MRN: [id number] DOB: [birth year]   Cancelled Treatment:    Reason Eval/Treat Not Completed: Patient not medically ready. Pt hypotensive on pressors, tachycardic, awaiting EEG. Will follow.   Malka So 05/22/2021, 8:01 AM Nestor Lewandowsky, OTR/L Acute Rehabilitation Services Pager: [phone] Office: [phone]

## 2021-05-22 NOTE — Progress Notes (Signed)
Sister, [name] and niece at bedside stating that patient is awake and asking about her care and what is going on.  Patient denies pain at present but with any movement patient grimaces.  Patient is A&Ox 3.  Sister states that she doesn't want pain medication drip started now and just wants to wait and see how the patient does through the night.  Pt voices that she just has a small amount of pain in her back but doesn't want anything big that is going to drop her blood pressure a lot.  Patient aware she is current;y on medications to keep her blood pressure up.  Sisiter and Niece remain at patients bedside

## 2021-05-22 NOTE — Progress Notes (Addendum)
STROKE TEAM PROGRESS NOTE   INTERVAL HISTORY Seen in ICU, echo being completed. Conversation with CCM and palliative care regarding goals of care. Awaiting arrival of family. MR recommends imaging in 6 months to evaluate area of restricted diffusion for mets.     Update: 1316: Patient is transitioning to comfort care  Vitals:   05/22/21 0645 05/22/21 0700 05/22/21 0715 05/22/21 0730  BP:      Pulse: (!) 152 (!) 153 (!) 156 (!) 147  Resp: (!) 41 (!) 36 (!) 36 (!) 27  Temp: (!) 100.8 F (38.2 C)     TempSrc: Bladder     SpO2: 100% 96% 99% 98%  Weight:       CBC:  Recent Labs  Lab 05/20/21 1249 05/20/21 1249 05/07/2021 1315 05/18/2021 1648 05/22/21 0035 05/22/21 0300  WBC 14.1*   < > 12.3* 10.6*  --  7.7  NEUTROABS 10.4*  --  11.3*  --   --   --   HGB 9.3*   < > 8.6* 8.3* 8.2* 8.4*  HCT 27.7*  --  26.1* 24.5* 24.0* 24.9*  MCV 86.6  --  87.3 87.2  --  88.3  PLT 291   < > 194 158  --  135*   < > = values in this interval not displayed.   Basic Metabolic Panel:  Recent Labs  Lab 05/15/2021 1315 05/20/2021 1648 05/22/21 0035 05/22/21 0300  NA 121*  --  128* 124*  K 2.5*  --  3.1* 2.8*  CL 84*  --   --  95*  CO2 26  --   --  15*  GLUCOSE 112*  --   --  201*  BUN 6  --   --  <5*  CREATININE 0.42* <0.30*  --  0.46  CALCIUM 7.6*  --   --  6.9*  MG 1.6*  --   --  2.2  PHOS  --   --   --  1.7*   Lipid Panel: No results for input(s): CHOL, TRIG, HDL, CHOLHDL, VLDL, LDLCALC in the last 168 hours. HgbA1c: No results for input(s): HGBA1C in the last 168 hours. Urine Drug Screen: No results for input(s): LABOPIA, COCAINSCRNUR, LABBENZ, AMPHETMU, THCU, LABBARB in the last 168 hours.  Alcohol Level No results for input(s): ETH in the last 168 hours.  IMAGING past 24 hours CT Head Wo Contrast  Result Date: 05/16/2021 CLINICAL DATA:  Brain metastases suspected EXAM: CT HEAD WITHOUT CONTRAST TECHNIQUE: Contiguous axial images were obtained from the base of the skull through the vertex  without intravenous contrast. RADIATION DOSE REDUCTION: This exam was performed according to the departmental dose-optimization program which includes automated exposure control, adjustment of the mA and/or kV according to patient size and/or use of iterative reconstruction technique. COMPARISON:  None. FINDINGS: Brain: No evidence of acute intracranial hemorrhage or extra-axial collection.There is focal hypoattenuation and loss of gray-white matter differentiation within the right parieto-occipital region. No concerning mass effect. The ventricles are normal in size. Vascular: No hyperdense vessel or unexpected calcification. Skull: Negative for fracture. Sinuses/Orbits: Rightward gaze of the right eye. The lens is not within the field of view of the left eye. The orbits are otherwise unremarkable. Paranasal sinuses are predominantly clear. Other: None. IMPRESSION: Focal hypoattenuation in the right parieto-occipital region favored to represent acute infarct. Given history of malignancy, an underlying metastasis is possible. Recommend MRI. Critical Value/emergent results were called by telephone at the time of interpretation on 05/17/2021 at 1:52 pm  to provider [facility] RAY , who verbally acknowledged these results. Electronically Signed   By: Maurine Simmering M.D.   On: 05/03/2021 13:54   MR BRAIN W WO CONTRAST  Result Date: 05/22/2021 CLINICAL DATA:  Neuro deficit with acute stroke suspected. Metastatic breast cancer. EXAM: MRI HEAD WITHOUT AND WITH CONTRAST TECHNIQUE: Multiplanar, multiecho pulse sequences of the brain and surrounding structures were obtained without and with intravenous contrast. CONTRAST:  61mL GADAVIST GADOBUTROL 1 MMOL/ML IV SOLN COMPARISON:  Head CT from yesterday. FINDINGS: Brain: Patchy foci of restricted diffusion in the left cerebellum, high right frontal, bilateral parietal, and right more than left occipital cortex. Mild involvement in the deep right cerebral white matter and caudate  body. Mild petechial hemorrhage associated with right convexity infarcts. A few areas of faint cortically based enhancement are suggested in the occipital and parietal cortex on coronal and sagittal postcontrast imaging, but limited by motion. More linear indefinite enhancement along the surface of the right cerebellum where there is no restricted diffusion. No hydrocephalus, collection, or brain atrophy Vascular: Normal flow voids and vascular enhancements Skull and upper cervical spine: No focal marrow lesion. Generally hypointense marrow in the cervical spine. Sinuses/Orbits: Negative. IMPRESSION: 1. Small acute infarcts scattered in the infra and supratentorial brain suggesting central embolic disease. 2. A few subtle areas of cortical enhancement likely reflecting subacute infarcts. Small area of right cerebellar enhancement not associated with restricted diffusion, possibly also subacute infarct but recommend enhanced brain MRI follow-up in 6-12 weeks to rule out metastatic disease. Electronically Signed   By: Jorje Guild M.D.   On: 05/22/2021 06:16   DG Chest Port 1 View  Result Date: 05/20/2021 CLINICAL DATA:  Questionable sepsis - evaluate for abnormality EXAM: PORTABLE CHEST 1 VIEW COMPARISON:  04/28/2021 FINDINGS: Right-sided chest port remains in place. Normal heart size. Moderate right-sided pleural effusion, increased from prior. Slightly prominent bilateral interstitial markings. No left-sided pleural effusion. No pneumothorax. Surgical clips in the left axilla. IMPRESSION: 1. Moderate right-sided pleural effusion, increased from prior. 2. Slightly prominent bilateral interstitial markings, possibly mild edema. Electronically Signed   By: Davina Poke D.O.   On: 05/07/2021 13:09   CT ANGIO HEAD NECK W WO CM W PERF  Result Date: 04/30/2021 CLINICAL DATA:  Neuro deficit, acute, stroke suspected EXAM: CT ANGIOGRAPHY HEAD AND NECK TECHNIQUE: Multidetector CT imaging of the head and neck  was performed using the standard protocol during bolus administration of intravenous contrast. Multiplanar CT image reconstructions and MIPs were obtained to evaluate the vascular anatomy. Carotid stenosis measurements (when applicable) are obtained utilizing NASCET criteria, using the distal internal carotid diameter as the denominator. RADIATION DOSE REDUCTION: This exam was performed according to the departmental dose-optimization program which includes automated exposure control, adjustment of the mA and/or kV according to patient size and/or use of iterative reconstruction technique. CONTRAST:  149mL OMNIPAQUE IOHEXOL 350 MG/ML SOLN COMPARISON:  Same day CT head. FINDINGS: CTA NECK FINDINGS Aortic arch: Great vessel origins are patent without significant stenosis. Right carotid system: No evidence of dissection, stenosis (50% or greater) or occlusion. Left carotid system: No evidence of dissection, stenosis (50% or greater) or occlusion. Mild narrowing of the ICA origin without greater than 50% stenosis. Vertebral arteries: Mildly right dominant. No evidence of dissection, stenosis (50% or greater) or occlusion. Skeleton: Moderate multilevel degenerative change in the cervical spine. Other neck: No evidence of acute abnormality on limited assessment. Upper chest: Large right pleural effusion with overlying atelectasis or consolidation, partially imaged. Review  of the MIP images confirms the above findings CTA HEAD FINDINGS Limited by venous contrast timing and mild motion. Within this limitation: Anterior circulation: Bilateral intracranial ICAs, MCAs, and ACAs are patent without proximal hemodynamically significant stenosis. No aneurysm identified. Posterior circulation: Bilateral intradural vertebral arteries, basilar artery, and posterior cerebral arteries are patent without proximal hemodynamically significant stenosis. No aneurysm identified. Venous sinuses: As permitted by contrast timing, patent. Review  of the MIP images confirms the above findings IMPRESSION: 1. No large vessel occlusion or proximal hemodynamically significant stenosis in the head or neck. 2. Partially imaged large right pleural effusion with overlying atelectasis or consolidation. Electronically Signed   By: Margaretha Sheffield M.D.   On: 05/04/2021 16:35    PHYSICAL EXAM  Physical Exam  Constitutional: Ill appearing African American female, lethargic  Cardiac: tachycardic, hypotensive  Neuro: Mental Status: Patient is awake, lethargic. A&O to person, place, and somewhat time (February). Able to follow commands No signs of aphasia; L hemineglect Cranial Nerves: II: Visual Fields are with L deficits. Pupils are equal, round, and reactive to light.   III,IV, VI: R gaze preference, looks to midline but does not cross. V: Facial sensation is symmetric to temperature VII: Facial movement is symmetric resting but with L facial droop when smiling VIII: Hearing is intact to voice X: Palate elevates symmetrically XI: Shoulder shrug is symmetric. XII: Tongue protrudes with mild L deviation. Motor: Tone is normal. Bulk is normal. 3/5 strength was present in all four extremities. Generalized weakness.  Sensory: Sensation is symmetric to light touch and temperature in the arms and legs. No extinction to DSS present   ASSESSMENT/PLAN [name] is a 46 y.o. female with history of breast cancer metastatic to the liver, recent hospitalization 04/22/2021 to 04/30/2021 with neutropenic fever and failure to thrive, on chemotherapy with Keytruda and Abraxane,  presenting with weakness, hypertension, visual changes, fever. Plan for EEG and ECHO today. Palliative care consult placed by CCM to discuss goals of care.   Stroke:  small scattered infarcts in the infra and supratentorial areas likely secondary embolic source Code Stroke CT head-hypodensity in right parietal occipital area concerning for stroke versus mets CTA head & neck  no LVO MRI small acute infarcts scattered in the infra and supratentorial brain suggesting central embolic disease, a few subtle areas of cortical enhancement likely reflecting subacute infarcts.  Small area of right cerebellar enhancement not associated with restricted diffusion but possibly also subacute infarct EEG- There was no seizure or seizure predisposition recorded on this study 2D Echo EF 60-65%, LV  normal function, no shunt detected, no valve abnormalities LDL No results found for requested labs within last 26280 hours. HgbA1c No results found for requested labs within last 26280 hours. VTE prophylaxis - SCDs No antithrombotic prior to admission, now on No antithrombotic. Therapy recommendations:  Pending Disposition:  Pending   Hypotension Vasopressors- Phenylphrine, vasopressin, norepinephrine  Hyperlipidemia Home meds:  None LDL No results found for requested labs within last 26280 hours., goal < 70 Start statin when medically appropriate  Other Stroke Risk Factors   Other Active Problems Sepsis Breast cancer with metastatic disease to the liver Palliative care consult Hypokalemia, Hyponatremia- management by CCM  K 2.8, Na 128  Hospital day # 1  As patient is transitioning to comfort care, neurology will sign off. Please call with any further questions.   Patient seen and examined by NP/APP with MD. MD to update note as needed.   Janine Ores, DNP, FNP-BC  [facility] Pager: [phone]  ATTENDING ATTESTATION:  46 y/o history of breast cancer metastatic to the liver, with neutropenic fever and failure to thrive, on chemotherapy with Keytruda and Abraxane, now with embolic CVAs. In ICU on 3 pressors. EEG neg. No need for keppra. Family transitioning to comfort care.   Will sign off, call with questions.   Dr. Reeves Forth evaluated pt independently, reviewed imaging, chart, labs. Discussed and formulated plan with the APP. Please see APP note above  for details.      This patient is critically ill due to and at significant risk of neurological worsening, death form heart failure, respiratory failure, recurrent stroke, bleeding from [facility], seizure, sepsis. This patient's care requires constant monitoring of vital signs, hemodynamics, respiratory and cardiac monitoring, review of multiple databases, neurological assessment, discussion with family, other specialists and medical decision making of high complexity. I spent 35 minutes of neurocritical care time in the care of this patient.   Deyonna Fitzsimmons,MD    To contact Stroke Continuity provider, please refer to [email or web address] After hours, contact General Neurology

## 2021-05-22 NOTE — Progress Notes (Addendum)
MR brain completed and reviewed IMPRESSION: 1. Small acute infarcts scattered in the infra and supratentorial brain suggesting central embolic disease. 2. A few subtle areas of cortical enhancement likely reflecting subacute infarcts. Small area of right cerebellar enhancement not associated with restricted diffusion, possibly also subacute infarct but recommend enhanced brain MRI follow-up in 6-12 weeks to rule out metastatic disease.   RECS: Stroke work up (ordered) EEG in the AM Do not see a need for continuing AEDs unless EEG is abnormal as clinical picture likely explained by strokes on MRI Stroke team to follow.  -- Amie Portland, MD Neurologist [facility] Pager: [phone]

## 2021-05-22 NOTE — Procedures (Addendum)
History: 46 yo F with gaze deviation  Sedation: None  Technique: This EEG was acquired with electrodes placed according to the International 10-20 electrode system (including Fp1, Fp2, F3, F4, C3, C4, P3, P4, O1, O2, T3, T4, T5, T6, A1, A2, Fz, Cz, Pz). The following electrodes were missing or displaced: none.   Background: The background is low voltage, and consists of intermixed alpha and beta activities. There is a well defined posterior dominant rhythm of 9 Hz that attenuates with eye opening, and is better seen on the left than right. Sleep is recorded with normal appearing sleep structures.  Photic stimulation: Physiologic driving is not performed  EEG Abnormalities: 1) Asymmetric PDR  Clinical Interpretation: This EEG is most consistent with a focal right hemispheric cortical dysfunction as can be seen with ischemic stroke, space-occupying lesions, or other cortical insult.   There was no seizure or seizure predisposition recorded on this study. Please note that lack of epileptiform activity on EEG does not preclude the possibility of epilepsy.   Roland Rack, MD [facility] [phone]  If 7pm- 7am, please page neurology on call as listed in [place].

## 2021-05-22 NOTE — Progress Notes (Signed)
EEG complete - results pending 

## 2021-05-23 DIAGNOSIS — Z515 Encounter for palliative care: Secondary | ICD-10-CM | POA: Diagnosis not present

## 2021-05-23 DIAGNOSIS — Z7189 Other specified counseling: Secondary | ICD-10-CM | POA: Diagnosis not present

## 2021-05-23 LAB — URINE CULTURE: Culture: >=100000 — AB

## 2021-05-23 MED ORDER — HYDROMORPHONE HCL 1 MG/ML IJ SOLN
0.5000 mg | Freq: Once | INTRAMUSCULAR | Status: AC
Start: 2021-05-23 — End: 2021-05-23
  Administered 2021-05-23: 0.5 mg via INTRAVENOUS
  Filled 2021-05-23: qty 0.5

## 2021-05-23 MED ORDER — KETOROLAC TROMETHAMINE 15 MG/ML IJ SOLN
15.0000 mg | Freq: Four times a day (QID) | INTRAMUSCULAR | Status: DC
  Administered 2021-05-23: 15 mg via INTRAVENOUS
  Filled 2021-05-23 (×3): qty 1

## 2021-05-23 MED ORDER — WHITE PETROLATUM EX OINT
TOPICAL_OINTMENT | CUTANEOUS | Status: DC | PRN
  Filled 2021-05-23: qty 28.35

## 2021-05-23 MED ORDER — ACETAMINOPHEN 10 MG/ML IV SOLN
1000.0000 mg | Freq: Once | INTRAVENOUS | Status: AC
  Administered 2021-05-23: 1000 mg via INTRAVENOUS
  Filled 2021-05-23: qty 100

## 2021-05-25 DIAGNOSIS — R627 Adult failure to thrive: Secondary | ICD-10-CM | POA: Diagnosis present

## 2021-05-25 DIAGNOSIS — Z66 Do not resuscitate: Secondary | ICD-10-CM | POA: Diagnosis present

## 2021-05-25 DIAGNOSIS — G928 Other toxic encephalopathy: Secondary | ICD-10-CM | POA: Diagnosis present

## 2021-05-25 DIAGNOSIS — B9562 Methicillin resistant Staphylococcus aureus infection as the cause of diseases classified elsewhere: Secondary | ICD-10-CM | POA: Diagnosis present

## 2021-05-25 DIAGNOSIS — C50919 Malignant neoplasm of unspecified site of unspecified female breast: Secondary | ICD-10-CM | POA: Diagnosis present

## 2021-05-25 DIAGNOSIS — Z515 Encounter for palliative care: Secondary | ICD-10-CM

## 2021-05-25 DIAGNOSIS — R7881 Bacteremia: Secondary | ICD-10-CM | POA: Diagnosis present

## 2021-05-25 DIAGNOSIS — E871 Hypo-osmolality and hyponatremia: Secondary | ICD-10-CM | POA: Diagnosis present

## 2021-05-25 LAB — CULTURE, BLOOD (ROUTINE X 2): Special Requests: ADEQUATE

## 2021-05-26 ENCOUNTER — Encounter: Payer: Self-pay | Admitting: *Deleted

## 2021-05-26 LAB — CULTURE, BLOOD (ROUTINE X 2): Special Requests: ADEQUATE

## 2021-05-26 NOTE — Progress Notes (Signed)
eLink Physician-Brief Progress Note Patient Name: Paige Perry DOB: Aug 13, 1975 MRN: [id number]   Date of Service  May 27, 2021  HPI/Events of Note  Fever, pain Pt awake, appears uncomfortable. Was supposed to be on comfort measures, but right now is refusing dilaudid drip.    eICU Interventions  Will give PRN dilaudid for now. Pt also due to receive acetaminophen for fever.  Will monitor response to the above.         [place] 05-27-21, 4:25 AM

## 2021-05-26 NOTE — Progress Notes (Signed)
°   Jun 07, 2021 1145  Clinical Encounter Type  Visited With Patient;Family  Visit Type Initial;Spiritual support  Referral From Chaplain  Consult/Referral To None    Chaplain responded to hand off request to visit with family if possible. Family was sitting quietly with patient as she rested. A peaceful place to be.   [place] Resident [facility]  [phone]

## 2021-05-26 NOTE — Progress Notes (Addendum)
Palliative Medicine Inpatient Follow Up Note   HPI: 46 year old with metastatic breast cancer to liver, recent hospitalization 1/26 to 2/3 for neutropenic fever and failure to thrive, on chemotherapy with Keytruda and Abraxane , persistent tachycardia , BIBEMS for fever 101.5, tachycardia 150s, hypotensive with systolic blood pressure in 80s.  Admitted to the ICU and placed on pressor support.   Patient identified to be septic as well as having small acute/subacute embolic infarcts.   Palliative care has been asked to discuss goals of care in the setting of severe illness and underlying incurable metastatic breast cancer.   Today's Discussion (05/26/2021):  *Please note that this is a verbal dictation therefore any spelling or grammatical errors are due to the "[place] One" system interpretation.  Chart reviewed inclusive of vital signs, progress notes, laboratory results, and diagnostic images.   I spoke to patient RN, Ginger. There was difficulty on the part of patients family identifying when to start the dilaudid intravenous drip. Patient had some beautiful wakeful moments yesterday whereby she got to spend quality time with her family.  I met with Jacy and her sister, [name] this morning. She was joined by her niece. Sahej was somnolent this morning. Per monitoring it appears her MAP is in the 40's. I shared the process of weaning Branda off of pressors and allowing her body to take it's natural process to her sister and niece.   Around 4AM patient was having an increase in pain burden. Ginger shares that in speaking with her she appeared scared and was requested her sister and mothers presence. Patients sister, [name] was able to come in and endorsed starting the dilaudid gtt after seeing the level of discomfort Marda was in. [name] shares that the most difficult part of this whole process has been deciding when to start the dilaudid gtt.   This morning, patient is quite  comfortable on dilaudid gtt at $Remo'1mg'lRvqW$ /hr.   Created space and opportunity for patient to explore thoughts feelings and fears regarding current medical situation.This is clearly exceptionally difficult for patients family.   Questions and concerns addressed   Palliative Support Provided  Objective Assessment: Vital Signs Vitals:   2021-05-26 0520 2021-05-26 0558  BP:    Pulse: (!) 155 (!) 140  Resp: (!) 24 (!) 26  Temp: (!) 101.7 F (38.7 C) (!) 101.3 F (38.5 C)  SpO2: 90% 93%    Intake/Output Summary (Last 24 hours) at May 26, 2021 1601 Last data filed at May 26, 2021 0600 Gross per 24 hour  Intake 4438.45 ml  Output 2100 ml  Net 2338.45 ml   Last Weight  Most recent update: 05/22/2021  1:42 AM    Weight  71.5 kg (157 lb 10.1 oz)            Gen:  Middle aged [place] F chronically ill appearing HEENT: moist mucous membranes CV: Irregular rate and rhythm  PULM:  On RA ABD: soft/nontender  EXT: No edema  Neuro: Somnolent  SUMMARY OF RECOMMENDATIONS   DNAR/DNI   Comfort Care   Dilaudid gtt started this morning  Plan for nursing to wean off pressors   Additional comfort medications per MAR - Received some ativan and glycopyrrolate overnight   Unrestricted visitation  Spiritual support has been offered   Ongoing Palliative support  Prognosis limited to minutes to hours once vasopressor gtts are stopped  MDM - High ______________________________________________________________________________________ [place] Team Team Cell Phone: [phone] Please utilize secure chat with additional questions, if there  there is no response within 30 minutes please call the above phone number  Palliative Medicine Team providers are available by phone from 7am to 7pm daily and can be reached through the team cell phone.  Should this patient require assistance outside of these hours, please call the patient's attending physician.

## 2021-05-26 NOTE — Progress Notes (Signed)
NAME:  Paige Perry, MRN:  [id number], DOB:  07-Sep-1975, LOS: 2 ADMISSION DATE:  05/15/2021, CONSULTATION DATE:  June 12, 2021  REFERRING MD:  Jeanell Sparrow, EDP , CHIEF COMPLAINT: Fever and tachycardia  History of Present Illness:  46 year old with metastatic breast cancer to liver, recent hospitalization 1/26 to 2/3 for neutropenic fever and failure to thrive, on chemotherapy with Keytruda and Abraxane , persistent tachycardia , BIBEMS for fever 101.5, tachycardia 150s, hypotensive with systolic blood pressure in 80s. Initial labs showed hypokalemia 2.9, hyponatremia 121, elevated bilirubin 5.5, leukocytosis 14.1 K She was noted to have a right gaze preference, CT head suggestive of acute occipital CVA versus metastasis   Pertinent  Medical History  Breast cancer, initially diagnosed 2020, ER positive, HER2 positive, status post neoadjuvant chemotherapy, left lumpectomy, RT, presented again 12/2020 with metastases to liver, left chest wall, breast  2/1 thoracenteses >> malignant cells  Significant Hospital Events: Including procedures, antibiotic start and stop dates in addition to other pertinent events   1/31 diffuse erythematous rash?  Cefepime induced , oral ulcerations treated with Diflucan , treated with dexamethasone for?  Drug-induced pneumonitis-new groundglass opacities in left upper lobe and lower lobe 2/1 right thoracentesis >> malignant cells  Interim History / Subjective:  Appears critically ill, tachycardic 140s, encephalopathic intermittently follows commands  Objective   Blood pressure (!) 74/60, pulse (!) 140, temperature 100 F (37.8 C), resp. rate (!) 27, weight 71.5 kg, SpO2 95 %.        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 --accounts for AMS --No seizure on EEG --appreciate neurology assistance  Malignant effusion on right  Metastatic breast cancer  Comfort measures: --dilaudid drip  Best Practice (right click and "Reselect all SmartList Selections" daily)   Diet/type: NPO DVT prophylaxis: prophylactic heparin  GI prophylaxis: N/A Lines: N/A Foley:  N/A Code Status:  full code Last date of multidisciplinary goals of care discussion [comfort care 2/25]  Labs   CBC: Recent Labs  Lab 05/20/21 1249 05/13/2021 1315 05/07/2021 1648 05/22/21 0035 05/22/21 0300  WBC 14.1* 12.3* 10.6*  --  7.7  NEUTROABS 10.4* 11.3*  --   --   --   HGB 9.3* 8.6* 8.3* 8.2* 8.4*  HCT 27.7* 26.1* 24.5* 24.0* 24.9*  MCV 86.6 87.3 87.2  --  88.3  PLT 291 194 158  --  135*     Basic Metabolic Panel: Recent Labs  Lab 05/20/21 1249 04/29/2021 1315 05/05/2021 1648 05/22/21 0035 05/22/21 0300  NA 121* 121*  --  128* 124*  K 2.9* 2.5*  --  3.1* 2.8*  CL 84* 84*  --   --  95*  CO2 29 26  --   --  15*  GLUCOSE 158* 112*  --   --  201*  BUN 7 6  --   --  <5*  CREATININE 0.31* 0.42* <0.30*  --  0.46  CALCIUM 8.3* 7.6*  --   --  6.9*  MG 1.4* 1.6*  --   --  2.2  PHOS  --   --   --   --  1.7*    GFR: Estimated Creatinine Clearance: 83.2 mL/min (by C-G formula based on SCr of 0.46 mg/dL). Recent Labs  Lab 05/20/21 1249 05/09/2021 1315 05/10/2021 1316 04/29/2021 1537 05/13/2021 1648 05/22/21 0300  WBC 14.1* 12.3*  --   --  10.6* 7.7  LATICACIDVEN  --   --  2.1* 1.8  --   --      Liver Function Tests: Recent Labs  Lab 05/20/21 1249 05/12/2021 1315  AST 424* 403*  ALT 243* 216*  ALKPHOS 483* 361*  BILITOT 5.5* 4.8*  PROT 6.7 6.1*  ALBUMIN  2.8* 1.9*    No results for input(s): LIPASE, AMYLASE in the last 168 hours. No results for input(s): AMMONIA in the last 168 hours.  ABG    Component Value Date/Time   PHART 7.447 05/22/2021 0035   PCO2ART 26.9 (L) 05/22/2021 0035   PO2ART 101 05/22/2021 0035   HCO3 18.6 (L) 05/22/2021 0035   TCO2 19 (L) 05/22/2021 0035   ACIDBASEDEF 5.0 (H) 05/22/2021 0035   O2SAT 98 05/22/2021 0035     Coagulation Profile: Recent Labs  Lab 05/17/2021 1315  INR 1.6*     Cardiac Enzymes: No results for input(s): CKTOTAL, CKMB, CKMBINDEX, TROPONINI in the last 168 hours.  HbA1C: No results found for: HGBA1C  CBG: Recent Labs  Lab 05/08/2021 2125 05/19/2021 2331 05/22/21 0328 05/22/21 0731 05/22/21 1139  GLUCAP 100* 111* 168* 132* 120*     Review of Systems:     Past Medical History:  She,  has a past medical history of Asthma, Cancer ([place]), Family history of breast cancer, Family history of breast cancer, Family history of prostate cancer, Family history of throat cancer, Hypercholesteremia, Hypokalemia (04/21/2021), and PONV (postoperative nausea and vomiting).   Surgical History:   Past Surgical History:  Procedure Laterality Date   AXILLARY LYMPH NODE DISSECTION Left 07/11/2019   Procedure: LEFT AXILLARY LYMPH NODE DISSECTION;  Surgeon: Donnie Mesa, MD;  Location: [place];  Service: General;  Laterality: Left;   BREAST LUMPECTOMY WITH RADIOACTIVE SEED AND AXILLARY LYMPH NODE DISSECTION Left 06/13/2019   Procedure: LEFT BREAST LUMPECTOMY WITH RADIOACTIVE SEED AND AXILLARY TARGETED LYMPH NODE DISSECTION;  Surgeon: Donnie Mesa, MD;  Location: [place];  Service: General;  Laterality: Left;   FINGER SURGERY     PORTACATH PLACEMENT Right 02/05/2019   Procedure: INSERTION PORT-A-CATH WITH ULTRASOUND;  Surgeon: Donnie Mesa, MD;  Location: [place];  Service: General;  Laterality: Right;   PORTACATH PLACEMENT Right 04/14/2021    Procedure: INSERTION PORT-A-CATH;  Surgeon: Donnie Mesa, MD;  Location: [place];  Service: General;  Laterality: Right;   WISDOM TOOTH EXTRACTION     WOUND EXPLORATION Right 06/13/2019   Procedure: RIGHT CHEST WOUND EXPLORATION;  Surgeon: Donnie Mesa, MD;  Location: [place];  Service: General;  Laterality: Right;     Social History:   reports that she has never smoked. She has never used smokeless tobacco. She reports current alcohol use. She reports that she does not use drugs.   Family History:  Her family history includes Breast cancer in her maternal aunt and paternal aunt; Heart failure in her mother; Hypertension in her mother; Prostate cancer (age of onset: 69) in her father; Throat cancer in her cousin.   Allergies Allergies  Allergen Reactions   Other     Corn: Unknown  Adhesive [Tape] Rash   Cefepime Rash   Latex Rash   Lovenox [Enoxaparin] Itching    Per patient when giving injection it causes her to itch   Tapentadol Rash     Home Medications  Prior to Admission medications   Medication Sig Start Date End Date Taking? Authorizing Provider  potassium chloride (MICRO-K) 10 MEQ CR capsule Take 2 capsules (20 mEq total) by mouth daily. 05/07/21   Nicholas Lose, MD  antiseptic oral rinse (BIOTENE) LIQD 15 mLs by Mouth Rinse route as needed for dry mouth. 04/30/21   Florencia Reasons, MD  feeding supplement (ENSURE ENLIVE / ENSURE PLUS) LIQD Take 237 mLs by mouth 3 (three) times daily between meals. 04/30/21   Florencia Reasons, MD  fentaNYL (DURAGESIC) 25 MCG/HR Place 1 patch onto the skin every 3 (three) days. 05/03/21   Gardenia Phlegm, NP  lidocaine (XYLOCAINE) 2 % solution Use as directed 15 mLs in the mouth or throat every 3 (three) hours as needed for mouth pain. 04/30/21   Florencia Reasons, MD  lidocaine-prilocaine (EMLA) cream Apply to affected area once Patient taking differently: 1 application daily as needed. Apply to affected area once 04/07/21    Nicholas Lose, MD  magic mouthwash SOLN Take 5 mLs by mouth 3 (three) times daily as needed for mouth pain. 05/13/21   Nicholas Lose, MD  magnesium oxide (MAG-OX) 400 (240 Mg) MG tablet Take 1 tablet (400 mg total) by mouth daily. 04/30/21   Florencia Reasons, MD  Multiple Vitamin (MULTIVITAMIN WITH MINERALS) TABS tablet Take 1 tablet by mouth daily. 05/01/21   Florencia Reasons, MD  polyethylene glycol (MIRALAX / GLYCOLAX) 17 g packet Take 17 g by mouth daily. 04/30/21   Florencia Reasons, MD  sodium chloride (OCEAN) 0.65 % SOLN nasal spray Place 1 spray into both nostrils as needed for congestion. 04/30/21   Florencia Reasons, MD     Critical care time:    N/a    Lanier Clam, MD See Amion for contact info  06/19/21

## 2021-05-26 NOTE — Progress Notes (Signed)
24mL Dilaudid wasted with Wilmer Floor RN

## 2021-05-26 NOTE — Progress Notes (Signed)
°   06-02-2021 0700  Clinical Encounter Type  Visited With Patient;Family  Visit Type Spiritual support;Patient actively dying  Referral From Nurse  Consult/Referral To [place] text;Prayer;Emotional   Chaplain requested by Unit Nurse to stop and provide emotional and spiritual support for patient who was nearing EOL. Chaplain sat with family and acknowledged her life as shared by the patient's older  sister, sister's daughter, and the patient's older son. Also, spoke with patient's nurse.    Melody Haver [phone]

## 2021-05-26 NOTE — Progress Notes (Signed)
°  Transition of Care [facility]) Screening Note   Patient Details  Name: Paige Perry Date of Birth: [birth year]   Transition of Care [facility]) CM/SW Contact:    Geralynn Ochs, LCSW Phone Number: 2021-06-14, 1:41 PM    Transition of Care Department [facility]) has reviewed patient and no TOC needs have been identified at this time. We will continue to monitor patient advancement through interdisciplinary progression rounds. If new patient transition needs arise, please place a TOC consult.

## 2021-05-26 NOTE — Death Summary Note (Signed)
DEATH SUMMARY   Patient Details  Name: Paige Perry MRN: [id number] DOB: [birth year] NLZ:[id number], Joelene Millin, NP  Admission/Discharge Information   Admit Date:  May 29, 2021  Date of Death: Date of Death: 31-May-2021  Time of Death: Time of Death: 06-18-24  Length of Stay: 2   Principle Cause of death: septic shock due to MRSA pneumonia  Hospital Diagnoses: Principal Problem:   Septic shock ([place]) Active Problems:   MRSA bacteremia   Metastatic breast cancer ([place])   DNR (do not resuscitate)   Comfort measures only status   Hyponatremia   Failure to thrive in adult   Hospital Course: 46 year old woman with widely metastatic breast cancer admitted with toxic metabolic encephalopathy.  EEG was negative.  MRI showed embolic strokes and possible metastasis in the brain which were new.  She was found to be MRSA positive on blood cultures 4 out of 4.  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  She had septic shock on pressors.  Her mental status was not improving.  Family meeting will be a palliative care was initiated.  They wish for no more suffering.  They agreed to comfort measures.  This was instituted.    Procedures: as per EMR  Consultations: as per EMR  The results of significant diagnostics from this hospitalization (including imaging, microbiology, ancillary and laboratory) are listed below for reference.   Significant Diagnostic Studies: CT Head Wo Contrast  Result Date: 2021-05-29 CLINICAL DATA:  Brain metastases suspected EXAM: CT HEAD WITHOUT CONTRAST TECHNIQUE: Contiguous axial images were obtained from the base of the skull through the vertex without intravenous contrast. RADIATION DOSE REDUCTION: This exam was performed according to the departmental dose-optimization program which includes automated exposure control, adjustment of the mA and/or kV according to patient size and/or use of iterative reconstruction technique. COMPARISON:  None. FINDINGS: Brain:  No evidence of acute intracranial hemorrhage or extra-axial collection.There is focal hypoattenuation and loss of gray-white matter differentiation within the right parieto-occipital region. No concerning mass effect. The ventricles are normal in size. Vascular: No hyperdense vessel or unexpected calcification. Skull: Negative for fracture. Sinuses/Orbits: Rightward gaze of the right eye. The lens is not within the field of view of the left eye. The orbits are otherwise unremarkable. Paranasal sinuses are predominantly clear. Other: None. IMPRESSION: Focal hypoattenuation in the right parieto-occipital region favored to represent acute infarct. Given history of malignancy, an underlying metastasis is possible. Recommend MRI. Critical Value/emergent results were called by telephone at the time of interpretation on 2021-05-29 at 1:52 pm to provider [facility] RAY , who verbally acknowledged these results. Electronically Signed   By: Maurine Simmering M.D.   On: 2021/05/29 13:54   CT Angio Chest Pulmonary Embolism (PE) W or WO Contrast  Result Date: 04/27/2021 CLINICAL DATA:  Shortness of breath EXAM: CT ANGIOGRAPHY CHEST WITH CONTRAST TECHNIQUE: Multidetector CT imaging of the chest was performed using the standard protocol during bolus administration of intravenous contrast. Multiplanar CT image reconstructions and MIPs were obtained to evaluate the vascular anatomy. RADIATION DOSE REDUCTION: This exam was performed according to the departmental dose-optimization program which includes automated exposure control, adjustment of the mA and/or kV according to patient size and/or use of iterative reconstruction technique. CONTRAST:  32mL OMNIPAQUE IOHEXOL 350 MG/ML SOLN COMPARISON:  Chest CT dated April 05, 2021 FINDINGS: Cardiovascular: Adequate contrast opacification of the pulmonary arteries. Limited evaluation of the segmental and subsegmental pulmonary arteries due to motion artifact and streak artifact. Normal heart  size. No pericardial  effusion. Normal caliber thoracic aorta. Mediastinum/Nodes: Enlarged left axillary, mediastinal, hilar, and internal mammary lymph nodes are decreased in size when compared with prior exam. Reference left axillary lymph node measuring 2.0 cm in short axis on series 5, image 67, previously measured 2.3 cm in short axis. Reference AP window lymph node measures 0.7 cm on image 94, previously measured 1.5 cm. Reference left internal mammary lymph node measures 0.8 cm in short axis, previously 1.1 cm. Lungs/Pleura: Increased large right pleural effusion with complete collapse of the right lower lobe and right middle lobe. New small left pleural effusion. New ground-glass opacities of the left upper lobe and left lower lobe. Upper Abdomen: Similar widespread metastatic disease seen of the liver with dominant right hepatic lobe mass and unchanged biliary ductal dilation. Musculoskeletal: No aggressive appearing osseous lesions. Review of the MIP images confirms the above findings. IMPRESSION: 1. No evidence of central pulmonary embolus, evaluation of the more distal pulmonary arteries is limited due to artifact. 2. Increased large right pleural effusion with complete collapse of the right lower lobe and right middle lobe. New small left pleural effusion. 3. New ground-glass opacities of the left upper lobe and left lower lobe, concerning for infection or inflammatory process such as drug reaction. 4. Enlarged left axillary, mediastinal, hilar, and internal mammary lymph nodes are decreased in size when compared with prior exam. 5. Similar widespread hepatic metastatic disease and biliary ductal dilation. Electronically Signed   By: Yetta Glassman M.D.   On: 04/27/2021 14:19   MR BRAIN W WO CONTRAST  Result Date: 05/22/2021 CLINICAL DATA:  Neuro deficit with acute stroke suspected. Metastatic breast cancer. EXAM: MRI HEAD WITHOUT AND WITH CONTRAST TECHNIQUE: Multiplanar, multiecho pulse sequences  of the brain and surrounding structures were obtained without and with intravenous contrast. CONTRAST:  22mL GADAVIST GADOBUTROL 1 MMOL/ML IV SOLN COMPARISON:  Head CT from yesterday. FINDINGS: Brain: Patchy foci of restricted diffusion in the left cerebellum, high right frontal, bilateral parietal, and right more than left occipital cortex. Mild involvement in the deep right cerebral white matter and caudate body. Mild petechial hemorrhage associated with right convexity infarcts. A few areas of faint cortically based enhancement are suggested in the occipital and parietal cortex on coronal and sagittal postcontrast imaging, but limited by motion. More linear indefinite enhancement along the surface of the right cerebellum where there is no restricted diffusion. No hydrocephalus, collection, or brain atrophy Vascular: Normal flow voids and vascular enhancements Skull and upper cervical spine: No focal marrow lesion. Generally hypointense marrow in the cervical spine. Sinuses/Orbits: Negative. IMPRESSION: 1. Small acute infarcts scattered in the infra and supratentorial brain suggesting central embolic disease. 2. A few subtle areas of cortical enhancement likely reflecting subacute infarcts. Small area of right cerebellar enhancement not associated with restricted diffusion, possibly also subacute infarct but recommend enhanced brain MRI follow-up in 6-12 weeks to rule out metastatic disease. Electronically Signed   By: Jorje Guild M.D.   On: 05/22/2021 06:16   DG Chest Port 1 View  Result Date: 05/07/2021 CLINICAL DATA:  Questionable sepsis - evaluate for abnormality EXAM: PORTABLE CHEST 1 VIEW COMPARISON:  04/28/2021 FINDINGS: Right-sided chest port remains in place. Normal heart size. Moderate right-sided pleural effusion, increased from prior. Slightly prominent bilateral interstitial markings. No left-sided pleural effusion. No pneumothorax. Surgical clips in the left axilla. IMPRESSION: 1. Moderate  right-sided pleural effusion, increased from prior. 2. Slightly prominent bilateral interstitial markings, possibly mild edema. Electronically Signed   By: Davina Poke  D.O.   On: 05/17/2021 13:09   DG CHEST PORT 1 VIEW  Result Date: 04/28/2021 CLINICAL DATA:  Status post thoracentesis EXAM: PORTABLE CHEST 1 VIEW COMPARISON:  04/27/2021 FINDINGS: Interval reduction in volume of a layering right pleural effusion. No significant pneumothorax. Probable small, layering left pleural effusion and some heterogeneous airspace opacities of the left lung. Right chest port catheter. Heart and mediastinum are unremarkable. IMPRESSION: 1. Interval reduction in volume of a layering right pleural effusion. No significant pneumothorax. 2. Probable small, layering left pleural effusion and some heterogeneous airspace opacities of the left lung. Electronically Signed   By: Delanna Ahmadi M.D.   On: 04/28/2021 14:56   DG Chest Port 1 View  Result Date: 04/27/2021 CLINICAL DATA:  Dyspnea EXAM: PORTABLE CHEST 1 VIEW COMPARISON:  04/22/2021 FINDINGS: Bilateral interstitial thickening. Patchy left perihilar airspace disease. Moderate right and small left pleural effusion. No pneumothorax. Stable cardiomediastinal silhouette. Right-sided Port-A-Cath in satisfactory position. No acute osseous abnormality. IMPRESSION: Moderate right and small left pleural effusion. Patchy left perihilar airspace disease concerning for pneumonia. Electronically Signed   By: Kathreen Devoid M.D.   On: 04/27/2021 09:00   EEG adult  Result Date: 05/22/2021 Greta Doom, MD     05/22/2021  9:03 AM History: 46 yo F with gaze deviation Sedation: None Technique: This EEG was acquired with electrodes placed according to the International 10-20 electrode system (including Fp1, Fp2, F3, F4, C3, C4, P3, P4, O1, O2, T3, T4, T5, T6, A1, A2, Fz, Cz, Pz). The following electrodes were missing or displaced: none. Background: The background is low voltage,  and consists of intermixed alpha and beta activities. There is a well defined posterior dominant rhythm of 9 Hz that attenuates with eye opening, and is better seen on the left than right. Sleep is recorded with normal appearing sleep structures. Photic stimulation: Physiologic driving is not performed EEG Abnormalities: 1) Asymmetric PDR Clinical Interpretation: This EEG is most consistent with a focal right hemispheric cortical dysfunction as can be seen with ischemic stroke, space-occupying lesions, or other cortical insult. There was no seizure or seizure predisposition recorded on this study. Please note that lack of epileptiform activity on EEG does not preclude the possibility of epilepsy. Roland Rack, MD [facility] [phone] If 7pm- 7am, please page neurology on call as listed in [place].   ECHOCARDIOGRAM COMPLETE  Result Date: 05/22/2021    ECHOCARDIOGRAM REPORT   Patient Name:   SHIVANI BARRANTES Date of Exam: 05/22/2021 Medical Rec #:  [id number]      Height:       63.0 in Accession #:    [id number]     Weight:       157.6 lb Date of Birth:  [birth year]      BSA:          1.748 m Patient Age:    24 years       BP:           98/61 mmHg Patient Gender: F              HR:           144 bpm. Exam Location:  Inpatient Procedure: 2D Echo, Cardiac Doppler, Color Doppler and Intracardiac            Opacification Agent Indications:    CVA  History:        Patient has prior history of Echocardiogram examinations, most  recent 04/28/2021. Risk Factors:Dyslipidemia.  Sonographer:    Luisa Hart RDCS Referring Phys: [id number] ASHISH ARORA IMPRESSIONS  1. Left ventricular ejection fraction, by estimation, is 40 to 45%. The left ventricle has mildly decreased function. The left ventricle demonstrates global hypokinesis. Left ventricular diastolic function could not be evaluated.  2. Right ventricular systolic function was not well visualized. The right ventricular size is normal.  3. The  mitral valve is normal in structure. Trivial mitral valve regurgitation. No evidence of mitral stenosis.  4. The aortic valve is tricuspid. Aortic valve regurgitation is not visualized. Aortic valve sclerosis is present, with no evidence of aortic valve stenosis.  5. The inferior vena cava is normal in size with greater than 50% respiratory variability, suggesting right atrial pressure of 3 mmHg.Patient wes very tachycardic at 145bpm during the study and images are of poor quality. recommend repeat limited echo once HR ocontrolled to get more accurate assessment of LVF FINDINGS  Left Ventricle: Left ventricular ejection fraction, by estimation, is 40 to 45%. The left ventricle has mildly decreased function. The left ventricle demonstrates global hypokinesis. The left ventricular internal cavity size was normal in size. There is  no left ventricular hypertrophy. Left ventricular diastolic function could not be evaluated. Right Ventricle: The right ventricular size is normal. No increase in right ventricular wall thickness. Right ventricular systolic function was not well visualized. Left Atrium: Left atrial size was normal in size. Right Atrium: Right atrial size was normal in size. Pericardium: There is no evidence of pericardial effusion. Mitral Valve: The mitral valve is normal in structure. Trivial mitral valve regurgitation. No evidence of mitral valve stenosis. Tricuspid Valve: The tricuspid valve is normal in structure. Tricuspid valve regurgitation is trivial. No evidence of tricuspid stenosis. Aortic Valve: The aortic valve is tricuspid. Aortic valve regurgitation is not visualized. Aortic valve sclerosis is present, with no evidence of aortic valve stenosis. Aortic valve mean gradient measures 2.0 mmHg. Aortic valve peak gradient measures 4.0  mmHg. Pulmonic Valve: The pulmonic valve was normal in structure. Pulmonic valve regurgitation is not visualized. No evidence of pulmonic stenosis. Aorta: The aortic  root is normal in size and structure. Venous: The inferior vena cava is normal in size with greater than 50% respiratory variability, suggesting right atrial pressure of 3 mmHg. IAS/Shunts: No atrial level shunt detected by color flow Doppler.  LEFT VENTRICLE PLAX 2D LVIDd:         4.10 cm Diastology LVIDs:         3.20 cm LV e' lateral: 15.70 cm/s LV PW:         0.90 cm LV IVS:        0.80 cm  RIGHT VENTRICLE RV Basal diam:  2.80 cm RV Mid diam:    2.00 cm RV S prime:     8.58 cm/s TAPSE (M-mode): 1.5 cm LEFT ATRIUM             Index        RIGHT ATRIUM          Index LA diam:        2.80 cm 1.60 cm/m   RA Area:     7.86 cm LA Vol (A2C):   20.1 ml 11.50 ml/m  RA Volume:   15.40 ml 8.81 ml/m LA Vol (A4C):   29.0 ml 16.59 ml/m LA Biplane Vol: 24.6 ml 14.08 ml/m  AORTIC VALVE  PULMONIC VALVE AV Vmax:           99.90 cm/s  PV Vmax:       0.84 m/s AV Vmean:          70.800 cm/s PV Vmean:      52.900 cm/s AV VTI:            0.128 m     PV VTI:        0.088 m AV Peak Grad:      4.0 mmHg    PV Peak grad:  2.8 mmHg AV Mean Grad:      2.0 mmHg    PV Mean grad:  1.0 mmHg LVOT Vmax:         69.40 cm/s LVOT Vmean:        46.600 cm/s LVOT VTI:          0.093 m LVOT/AV VTI ratio: 0.73  AORTA Ao Asc diam: 2.60 cm TRICUSPID VALVE TR Peak grad:   25.4 mmHg TR Vmax:        252.00 cm/s  SHUNTS Systemic VTI: 0.09 m Fransico Him MD Electronically signed by Fransico Him MD Signature Date/Time: 05/22/2021/11:28:05 AM    Final    ECHOCARDIOGRAM COMPLETE  Result Date: 04/28/2021    ECHOCARDIOGRAM REPORT   Patient Name:   SALWA BAI Date of Exam: 04/28/2021 Medical Rec #:  [id number]      Height:       63.0 in Accession #:    [id number]     Weight:       181.2 lb Date of Birth:  1976/02/20      BSA:          1.854 m Patient Age:    43 years       BP:           84/61 mmHg Patient Gender: F              HR:           115 bpm. Exam Location:  Inpatient Procedure: 2D Echo, Cardiac Doppler, Color Doppler and Intracardiac             Opacification Agent Indications:    R06.02 SOB  History:        Patient has prior history of Echocardiogram examinations, most                 recent 01/29/2019. Signs/Symptoms:Dyspnea and Fever. Metastatic                 cancer.  Sonographer:    Roseanna Rainbow RDCS Referring Phys: [id number] [place]  Sonographer Comments: Technically difficult study due to poor echo windows. IMPRESSIONS  1. Left ventricular ejection fraction, by estimation, is 60 to 65%. The left ventricle has normal function. The left ventricle has no regional wall motion abnormalities. Left ventricular diastolic parameters were normal.  2. Right ventricular systolic function was not well visualized. The right ventricular size is not well visualized. There is mildly elevated pulmonary artery systolic pressure.  3. The mitral valve is normal in structure. No evidence of mitral valve regurgitation. No evidence of mitral stenosis.  4. Tricuspid valve regurgitation is mild to moderate.  5. The aortic valve has an indeterminant number of cusps. Aortic valve regurgitation is not visualized. Aortic valve sclerosis/calcification is present, without any evidence of aortic stenosis.  6. The inferior vena cava is normal in size with greater than 50% respiratory variability, suggesting right atrial pressure of  3 mmHg. FINDINGS  Left Ventricle: Left ventricular ejection fraction, by estimation, is 60 to 65%. The left ventricle has normal function. The left ventricle has no regional wall motion abnormalities. Definity contrast agent was given IV to delineate the left ventricular  endocardial borders. The left ventricular internal cavity size was normal in size. There is no left ventricular hypertrophy. Left ventricular diastolic parameters were normal. Right Ventricle: The right ventricular size is not well visualized. No increase in right ventricular wall thickness. Right ventricular systolic function was not well visualized. There is mildly elevated  pulmonary artery systolic pressure. The tricuspid regurgitant velocity is 2.77 m/s, and with an assumed right atrial pressure of 8 mmHg, the estimated right ventricular systolic pressure is 82.5 mmHg. Left Atrium: Left atrial size was normal in size. Right Atrium: Right atrial size was normal in size. Pericardium: There is no evidence of pericardial effusion. Mitral Valve: The mitral valve is normal in structure. No evidence of mitral valve regurgitation. No evidence of mitral valve stenosis. Tricuspid Valve: The tricuspid valve is not well visualized. Tricuspid valve regurgitation is mild to moderate. No evidence of tricuspid stenosis. Aortic Valve: The aortic valve has an indeterminant number of cusps. Aortic valve regurgitation is not visualized. Aortic valve sclerosis/calcification is present, without any evidence of aortic stenosis. Pulmonic Valve: The pulmonic valve was not well visualized. Pulmonic valve regurgitation is not visualized. No evidence of pulmonic stenosis. Aorta: The aortic root is normal in size and structure. Venous: The inferior vena cava is normal in size with greater than 50% respiratory variability, suggesting right atrial pressure of 3 mmHg. IAS/Shunts: No atrial level shunt detected by color flow Doppler.  LEFT VENTRICLE PLAX 2D LVIDd:         2.90 cm     Diastology LVIDs:         2.10 cm     LV e' medial:    10.00 cm/s LV PW:         1.00 cm     LV E/e' medial:  7.2 LV IVS:        0.80 cm     LV e' lateral:   10.30 cm/s LVOT diam:     2.00 cm     LV E/e' lateral: 7.0 LV SV:         36 LV SV Index:   19 LVOT Area:     3.14 cm  LV Volumes (MOD) LV vol d, MOD A2C: 27.6 ml LV vol d, MOD A4C: 25.2 ml LV vol s, MOD A2C: 9.1 ml LV vol s, MOD A4C: 11.5 ml LV SV MOD A2C:     18.5 ml LV SV MOD A4C:     25.2 ml LV SV MOD BP:      16.1 ml RIGHT VENTRICLE             IVC RV S prime:     12.50 cm/s  IVC diam: 1.00 cm TAPSE (M-mode): 1.3 cm LEFT ATRIUM             Index       RIGHT ATRIUM           Index LA diam:        2.70 cm 1.46 cm/m  RA Area:     7.94 cm LA Vol (A2C):   11.7 ml 6.31 ml/m  RA Volume:   14.80 ml 7.98 ml/m LA Vol (A4C):   13.5 ml 7.28 ml/m LA Biplane Vol: 13.0 ml 7.01 ml/m  AORTIC VALVE LVOT Vmax:   88.60 cm/s LVOT Vmean:  61.300 cm/s LVOT VTI:    0.114 m  AORTA Ao Root diam: 2.60 cm Ao Asc diam:  2.70 cm MITRAL VALVE               TRICUSPID VALVE MV Area (PHT): 5.66 cm    TR Peak grad:   30.7 mmHg MV Decel Time: 134 msec    TR Vmax:        277.00 cm/s MV E velocity: 71.70 cm/s MV A velocity: 82.60 cm/s  SHUNTS MV E/A ratio:  0.87        Systemic VTI:  0.11 m                            Systemic Diam: 2.00 cm Godfrey Pick Tobb DO Electronically signed by Berniece Salines DO Signature Date/Time: 04/28/2021/5:00:40 PM    Final    CT ANGIO HEAD NECK W WO CM W PERF  Result Date: 05/04/2021 CLINICAL DATA:  Neuro deficit, acute, stroke suspected EXAM: CT ANGIOGRAPHY HEAD AND NECK TECHNIQUE: Multidetector CT imaging of the head and neck was performed using the standard protocol during bolus administration of intravenous contrast. Multiplanar CT image reconstructions and MIPs were obtained to evaluate the vascular anatomy. Carotid stenosis measurements (when applicable) are obtained utilizing NASCET criteria, using the distal internal carotid diameter as the denominator. RADIATION DOSE REDUCTION: This exam was performed according to the departmental dose-optimization program which includes automated exposure control, adjustment of the mA and/or kV according to patient size and/or use of iterative reconstruction technique. CONTRAST:  130mL OMNIPAQUE IOHEXOL 350 MG/ML SOLN COMPARISON:  Same day CT head. FINDINGS: CTA NECK FINDINGS Aortic arch: Great vessel origins are patent without significant stenosis. Right carotid system: No evidence of dissection, stenosis (50% or greater) or occlusion. Left carotid system: No evidence of dissection, stenosis (50% or greater) or occlusion. Mild narrowing of the ICA  origin without greater than 50% stenosis. Vertebral arteries: Mildly right dominant. No evidence of dissection, stenosis (50% or greater) or occlusion. Skeleton: Moderate multilevel degenerative change in the cervical spine. Other neck: No evidence of acute abnormality on limited assessment. Upper chest: Large right pleural effusion with overlying atelectasis or consolidation, partially imaged. Review of the MIP images confirms the above findings CTA HEAD FINDINGS Limited by venous contrast timing and mild motion. Within this limitation: Anterior circulation: Bilateral intracranial ICAs, MCAs, and ACAs are patent without proximal hemodynamically significant stenosis. No aneurysm identified. Posterior circulation: Bilateral intradural vertebral arteries, basilar artery, and posterior cerebral arteries are patent without proximal hemodynamically significant stenosis. No aneurysm identified. Venous sinuses: As permitted by contrast timing, patent. Review of the MIP images confirms the above findings IMPRESSION: 1. No large vessel occlusion or proximal hemodynamically significant stenosis in the head or neck. 2. Partially imaged large right pleural effusion with overlying atelectasis or consolidation. Electronically Signed   By: Margaretha Sheffield M.D.   On: 05/06/2021 16:35   US THORACENTESIS ASP PLEURAL SPACE W/IMG GUIDE  Result Date: 04/28/2021 INDICATION: History of metastatic breast cancer. Shortness of breath, pleural effusion seen on previous chest x-ray. Request for therapeutic and diagnostic thoracentesis. EXAM: ULTRASOUND GUIDED RIGHT THORACENTESIS MEDICATIONS: 15 mL 1% lidocaine COMPLICATIONS: None immediate. PROCEDURE: An ultrasound guided thoracentesis was thoroughly discussed with the patient and questions answered. The benefits, risks, alternatives and complications were also discussed. The patient understands and wishes to proceed with the procedure. Written consent was obtained. Ultrasound  was  performed to localize and mark an adequate pocket of fluid in the right chest. The area was then prepped and draped in the normal sterile fashion. 1% Lidocaine was used for local anesthesia. Under ultrasound guidance a 6 Fr Safe-T-Centesis catheter was introduced. Thoracentesis was performed. The catheter was removed and a dressing applied. FINDINGS: A total of approximately 1 L of hazy brown fluid was removed. Samples were sent to the laboratory as requested by the clinical team. Post procedure chest X-ray reviewed, negative for pneumothorax. IMPRESSION: Successful ultrasound guided right thoracentesis yielding 1 L of pleural fluid. Read by: Durenda Guthrie, PA-C Electronically Signed   By: Jerilynn Mages.  Shick M.D.   On: 04/28/2021 15:49    Microbiology: Recent Results (from the past 240 hour(s))  Urine Culture     Status: Abnormal   Collection Time: 05/05/2021 12:51 PM   Specimen: Urine, Catheterized  Result Value Ref Range Status   Specimen Description   Final    URINE, CATHETERIZED Performed at [facility] [address], [place], [place] [address]    Special Requests   Final    NONE Performed at [facility], [place] [address], [place], [place] [address]    Culture >=100,000 COLONIES/mL STAPHYLOCOCCUS AUREUS (A)  Final   Report Status 2021-06-09 FINAL  Final   Organism ID, Bacteria STAPHYLOCOCCUS AUREUS (A)  Final      Susceptibility   Staphylococcus aureus - MIC*    CIPROFLOXACIN <=0.5 SENSITIVE Sensitive     GENTAMICIN <=0.5 SENSITIVE Sensitive     NITROFURANTOIN 32 SENSITIVE Sensitive     OXACILLIN <=0.25 SENSITIVE Sensitive     TETRACYCLINE <=1 SENSITIVE Sensitive     VANCOMYCIN 1 SENSITIVE Sensitive     TRIMETH/SULFA <=10 SENSITIVE Sensitive     CLINDAMYCIN <=0.25 SENSITIVE Sensitive     RIFAMPIN <=0.5 SENSITIVE Sensitive     Inducible Clindamycin NEGATIVE Sensitive     * >=100,000 COLONIES/mL STAPHYLOCOCCUS AUREUS  Resp Panel by RT-PCR (Flu A&B, Covid)  Nasopharyngeal Swab     Status: None   Collection Time: 05/20/2021  1:00 PM   Specimen: Nasopharyngeal Swab; Nasopharyngeal(NP) swabs in vial transport medium  Result Value Ref Range Status   SARS Coronavirus 2 by RT PCR NEGATIVE NEGATIVE Final    Comment: (NOTE) SARS-CoV-2 target nucleic acids are NOT DETECTED.  The SARS-CoV-2 RNA is generally detectable in upper respiratory specimens during the acute phase of infection. The lowest concentration of SARS-CoV-2 viral copies this assay can detect is 138 copies/mL. A negative result does not preclude SARS-Cov-2 infection and should not be used as the sole basis for treatment or other patient management decisions. A negative result may occur with  improper specimen collection/handling, submission of specimen other than nasopharyngeal swab, presence of viral mutation(s) within the areas targeted by this assay, and inadequate number of viral copies(<138 copies/mL). A negative result must be combined with clinical observations, patient history, and epidemiological information. The expected result is Negative.  Fact Sheet for Patients:  EntrepreneurPulse.com.au  Fact Sheet for Healthcare Providers:  IncredibleEmployment.be  This test is no t yet approved or cleared by the [place] FDA and  has been authorized for detection and/or diagnosis of SARS-CoV-2 by FDA under an Emergency Use Authorization (EUA). This EUA will remain  in effect (meaning this test can be used) for the duration of the COVID-19 declaration under Section 564(b)(1) of the Act, 21 U.S.C.section 360bbb-3(b)(1), unless the authorization is terminated  or revoked sooner.  Influenza A by PCR NEGATIVE NEGATIVE Final   Influenza B by PCR NEGATIVE NEGATIVE Final    Comment: (NOTE) The Xpert Xpress SARS-CoV-2/FLU/RSV plus assay is intended as an aid in the diagnosis of influenza from Nasopharyngeal swab specimens and should not be  used as a sole basis for treatment. Nasal washings and aspirates are unacceptable for Xpert Xpress SARS-CoV-2/FLU/RSV testing.  Fact Sheet for Patients: EntrepreneurPulse.com.au  Fact Sheet for Healthcare Providers: IncredibleEmployment.be  This test is not yet approved or cleared by the [place] FDA and has been authorized for detection and/or diagnosis of SARS-CoV-2 by FDA under an Emergency Use Authorization (EUA). This EUA will remain in effect (meaning this test can be used) for the duration of the COVID-19 declaration under Section 564(b)(1) of the Act, 21 U.S.C. section 360bbb-3(b)(1), unless the authorization is terminated or revoked.  Performed at [facility], [place] [address], [place], [place] [address]   Blood Culture (routine x 2)     Status: Abnormal   Collection Time: 05/11/2021  1:15 PM   Specimen: BLOOD  Result Value Ref Range Status   Specimen Description   Final    BLOOD LEFT ANTECUBITAL Performed at [facility] [address], [place], [place] [address]    Special Requests   Final    BOTTLES DRAWN AEROBIC AND ANAEROBIC Blood Culture adequate volume Performed at [facility] [address] [place], [place] [address]    Culture  Setup Time   Final    GRAM POSITIVE COCCI IN BOTH AEROBIC AND ANAEROBIC BOTTLES CRITICAL RESULT CALLED TO, READ BACK BY AND VERIFIED WITH: PHARMD JAMES LEDFORD 05/22/21@6 :08 BY TW Performed at [facility], [place] [address] [place], [place] [address]    Culture STAPHYLOCOCCUS AUREUS (A)  Final   Report Status 05/25/2021 FINAL  Final   Organism ID, Bacteria STAPHYLOCOCCUS AUREUS  Final      Susceptibility   Staphylococcus aureus - MIC*    CIPROFLOXACIN <=0.5 SENSITIVE Sensitive     ERYTHROMYCIN <=0.25 SENSITIVE Sensitive     GENTAMICIN <=0.5 SENSITIVE Sensitive     OXACILLIN <=0.25 SENSITIVE Sensitive     TETRACYCLINE <=1  SENSITIVE Sensitive     VANCOMYCIN <=0.5 SENSITIVE Sensitive     TRIMETH/SULFA <=10 SENSITIVE Sensitive     CLINDAMYCIN <=0.25 SENSITIVE Sensitive     RIFAMPIN <=0.5 SENSITIVE Sensitive     Inducible Clindamycin NEGATIVE Sensitive     * STAPHYLOCOCCUS AUREUS  Blood Culture (routine x 2)     Status: Abnormal (Preliminary result)   Collection Time: 05/24/2021  1:15 PM   Specimen: BLOOD  Result Value Ref Range Status   Specimen Description   Final    BLOOD Performed at [facility] [address], [place], [place] [address]    Special Requests   Final    BOTTLES DRAWN AEROBIC AND ANAEROBIC Blood Culture adequate volume Performed at [facility] [address], [place], [place] [address]    Culture  Setup Time   Final    GRAM POSITIVE COCCI IN BOTH AEROBIC AND ANAEROBIC BOTTLES CRITICAL VALUE NOTED.  VALUE IS CONSISTENT WITH PREVIOUSLY REPORTED AND CALLED VALUE.    Culture (A)  Final    STAPHYLOCOCCUS AUREUS SUSCEPTIBILITIES PERFORMED ON PREVIOUS CULTURE WITHIN THE LAST 5 DAYS. Performed at [facility], [place] [address] [place], [place] [address]    Report Status PENDING  Incomplete  Blood Culture ID Panel (Reflexed)  Status: Abnormal   Collection Time: 05/20/2021  1:15 PM  Result Value Ref Range Status   Enterococcus faecalis NOT DETECTED NOT DETECTED Final   Enterococcus Faecium NOT DETECTED NOT DETECTED Final   Listeria monocytogenes NOT DETECTED NOT DETECTED Final   Staphylococcus species DETECTED (A) NOT DETECTED Final    Comment: CRITICAL RESULT CALLED TO, READ BACK BY AND VERIFIED WITH: PHARMD JAMES LEDFORD 05/22/21@6 :08 BY TW    Staphylococcus aureus (BCID) DETECTED (A) NOT DETECTED Final    Comment: Methicillin (oxacillin)-resistant Staphylococcus aureus (MRSA). MRSA is predictably resistant to beta-lactam antibiotics (except ceftaroline). Preferred therapy is vancomycin unless clinically contraindicated. Patient requires  contact precautions if  hospitalized. CRITICAL RESULT CALLED TO, READ BACK BY AND VERIFIED WITH: PHARMD JAMES LEDFORD 05/22/21@6 :08 BY TW    Staphylococcus epidermidis NOT DETECTED NOT DETECTED Final   Staphylococcus lugdunensis NOT DETECTED NOT DETECTED Final   Streptococcus species NOT DETECTED NOT DETECTED Final   Streptococcus agalactiae NOT DETECTED NOT DETECTED Final   Streptococcus pneumoniae NOT DETECTED NOT DETECTED Final   Streptococcus pyogenes NOT DETECTED NOT DETECTED Final   A.calcoaceticus-baumannii NOT DETECTED NOT DETECTED Final   Bacteroides fragilis NOT DETECTED NOT DETECTED Final   Enterobacterales NOT DETECTED NOT DETECTED Final   Enterobacter cloacae complex NOT DETECTED NOT DETECTED Final   Escherichia coli NOT DETECTED NOT DETECTED Final   Klebsiella aerogenes NOT DETECTED NOT DETECTED Final   Klebsiella oxytoca NOT DETECTED NOT DETECTED Final   Klebsiella pneumoniae NOT DETECTED NOT DETECTED Final   Proteus species NOT DETECTED NOT DETECTED Final   Salmonella species NOT DETECTED NOT DETECTED Final   Serratia marcescens NOT DETECTED NOT DETECTED Final   Haemophilus influenzae NOT DETECTED NOT DETECTED Final   Neisseria meningitidis NOT DETECTED NOT DETECTED Final   Pseudomonas aeruginosa NOT DETECTED NOT DETECTED Final   Stenotrophomonas maltophilia NOT DETECTED NOT DETECTED Final   Candida albicans NOT DETECTED NOT DETECTED Final   Candida auris NOT DETECTED NOT DETECTED Final   Candida glabrata NOT DETECTED NOT DETECTED Final   Candida krusei NOT DETECTED NOT DETECTED Final   Candida parapsilosis NOT DETECTED NOT DETECTED Final   Candida tropicalis NOT DETECTED NOT DETECTED Final   Cryptococcus neoformans/gattii NOT DETECTED NOT DETECTED Final   Meth resistant mecA/C and MREJ DETECTED (A) NOT DETECTED Final    Comment: CRITICAL RESULT CALLED TO, READ BACK BY AND VERIFIED WITH: PHARMD JAMES LEDFORD 05/22/21@6 :08 BY TW Performed at [facility],  [address], [place], [place] [address]   MRSA Next Gen by PCR, Nasal     Status: None   Collection Time: 05/08/2021  9:36 PM   Specimen: Nasal Mucosa; Nasal Swab  Result Value Ref Range Status   MRSA by PCR Next Gen NOT DETECTED NOT DETECTED Final    Comment: (NOTE) The GeneXpert MRSA Assay (FDA approved for NASAL specimens only), is one component of a comprehensive MRSA colonization surveillance program. It is not intended to diagnose MRSA infection nor to guide or monitor treatment for MRSA infections. Test performance is not FDA approved in patients less than 3 years old. Performed at [facility], [place] [address] [place], [place] [address]     Time spent: 20 minutes  Signed: Lanier Clam, MD 2021/06/20

## 2021-05-26 DEATH — deceased

## 2021-05-27 ENCOUNTER — Other Ambulatory Visit: Payer: BC Managed Care – PPO

## 2021-05-27 ENCOUNTER — Ambulatory Visit: Payer: BC Managed Care – PPO

## 2021-05-27 ENCOUNTER — Encounter: Payer: BC Managed Care – PPO | Admitting: Nutrition

## 2021-05-27 ENCOUNTER — Encounter: Payer: BC Managed Care – PPO | Admitting: Nurse Practitioner

## 2021-05-27 ENCOUNTER — Ambulatory Visit: Payer: BC Managed Care – PPO | Admitting: Hematology and Oncology

## 2021-06-03 ENCOUNTER — Ambulatory Visit: Payer: BC Managed Care – PPO | Admitting: Hematology and Oncology

## 2021-06-03 ENCOUNTER — Other Ambulatory Visit: Payer: BC Managed Care – PPO

## 2021-06-03 ENCOUNTER — Ambulatory Visit: Payer: BC Managed Care – PPO

## 2021-06-10 ENCOUNTER — Ambulatory Visit: Payer: BC Managed Care – PPO | Admitting: Hematology and Oncology

## 2021-06-10 ENCOUNTER — Ambulatory Visit: Payer: BC Managed Care – PPO

## 2021-06-10 ENCOUNTER — Other Ambulatory Visit: Payer: BC Managed Care – PPO

## 2021-06-17 ENCOUNTER — Ambulatory Visit: Payer: BC Managed Care – PPO

## 2021-06-17 ENCOUNTER — Other Ambulatory Visit: Payer: BC Managed Care – PPO

## 2021-06-17 ENCOUNTER — Ambulatory Visit: Payer: BC Managed Care – PPO | Admitting: Hematology and Oncology
